# Patient Record
Sex: Female | Born: 1935 | Race: White | Hispanic: No | Smoking: Never smoker
Health system: Southern US, Community
[De-identification: ages and names within clinical notes are randomized; demographics above are authoritative.]

## PROBLEM LIST (undated history)

## (undated) DIAGNOSIS — M1990 Unspecified osteoarthritis, unspecified site: Secondary | ICD-10-CM

## (undated) DIAGNOSIS — E538 Deficiency of other specified B group vitamins: Secondary | ICD-10-CM

## (undated) DIAGNOSIS — I10 Essential (primary) hypertension: Secondary | ICD-10-CM

## (undated) DIAGNOSIS — K219 Gastro-esophageal reflux disease without esophagitis: Secondary | ICD-10-CM

## (undated) DIAGNOSIS — S62109A Fracture of unspecified carpal bone, unspecified wrist, initial encounter for closed fracture: Secondary | ICD-10-CM

## (undated) DIAGNOSIS — D649 Anemia, unspecified: Secondary | ICD-10-CM

## (undated) DIAGNOSIS — B029 Zoster without complications: Secondary | ICD-10-CM

## (undated) DIAGNOSIS — K5900 Constipation, unspecified: Secondary | ICD-10-CM

## (undated) DIAGNOSIS — E039 Hypothyroidism, unspecified: Secondary | ICD-10-CM

## (undated) DIAGNOSIS — J329 Chronic sinusitis, unspecified: Secondary | ICD-10-CM

## (undated) DIAGNOSIS — E7800 Pure hypercholesterolemia, unspecified: Secondary | ICD-10-CM

## (undated) DIAGNOSIS — F0150 Vascular dementia without behavioral disturbance: Secondary | ICD-10-CM

## (undated) DIAGNOSIS — E871 Hypo-osmolality and hyponatremia: Secondary | ICD-10-CM

## (undated) DIAGNOSIS — L501 Idiopathic urticaria: Secondary | ICD-10-CM

## (undated) DIAGNOSIS — N390 Urinary tract infection, site not specified: Secondary | ICD-10-CM

## (undated) DIAGNOSIS — F411 Generalized anxiety disorder: Secondary | ICD-10-CM

## (undated) DIAGNOSIS — Z515 Encounter for palliative care: Secondary | ICD-10-CM

## (undated) DIAGNOSIS — I872 Venous insufficiency (chronic) (peripheral): Secondary | ICD-10-CM

## (undated) DIAGNOSIS — I679 Cerebrovascular disease, unspecified: Secondary | ICD-10-CM

## (undated) DIAGNOSIS — M5030 Other cervical disc degeneration, unspecified cervical region: Secondary | ICD-10-CM

## (undated) DIAGNOSIS — K589 Irritable bowel syndrome without diarrhea: Secondary | ICD-10-CM

## (undated) HISTORY — DX: Fracture of unspecified carpal bone, unspecified wrist, initial encounter for closed fracture: S62.109A

## (undated) HISTORY — DX: Gastro-esophageal reflux disease without esophagitis: K21.9

## (undated) HISTORY — DX: Idiopathic urticaria: L50.1

## (undated) HISTORY — DX: Zoster without complications: B02.9

## (undated) HISTORY — DX: Unspecified osteoarthritis, unspecified site: M19.90

## (undated) HISTORY — DX: Deficiency of other specified B group vitamins: E53.8

## (undated) HISTORY — DX: Cerebrovascular disease, unspecified: I67.9

## (undated) HISTORY — DX: Generalized anxiety disorder: F41.1

## (undated) HISTORY — DX: Venous insufficiency (chronic) (peripheral): I87.2

## (undated) HISTORY — DX: Other cervical disc degeneration, unspecified cervical region: M50.30

## (undated) HISTORY — PX: CERVICAL FUSION: SHX112

## (undated) HISTORY — DX: Pure hypercholesterolemia, unspecified: E78.00

## (undated) HISTORY — PX: NASAL SINUS SURGERY: SHX719

## (undated) HISTORY — DX: Chronic sinusitis, unspecified: J32.9

## (undated) HISTORY — DX: Essential (primary) hypertension: I10

## (undated) HISTORY — DX: Constipation, unspecified: K59.00

## (undated) HISTORY — PX: TOTAL ABDOMINAL HYSTERECTOMY W/ BILATERAL SALPINGOOPHORECTOMY: SHX83

## (undated) HISTORY — DX: Anemia, unspecified: D64.9

## (undated) HISTORY — PX: TONSILLECTOMY: SUR1361

## (undated) HISTORY — DX: Urinary tract infection, site not specified: N39.0

## (undated) HISTORY — PX: CATARACT EXTRACTION: SUR2

---

## 1998-03-30 ENCOUNTER — Observation Stay (HOSPITAL_COMMUNITY): Admission: EM | Admit: 1998-03-30 | Discharge: 1998-04-01 | Payer: Self-pay | Admitting: Emergency Medicine

## 1998-03-30 ENCOUNTER — Encounter: Payer: Self-pay | Admitting: Emergency Medicine

## 1998-05-27 ENCOUNTER — Ambulatory Visit (HOSPITAL_COMMUNITY): Admission: RE | Admit: 1998-05-27 | Discharge: 1998-05-27 | Payer: Self-pay | Admitting: Neurosurgery

## 1998-06-20 ENCOUNTER — Ambulatory Visit: Admission: RE | Admit: 1998-06-20 | Discharge: 1998-06-21 | Payer: Self-pay | Admitting: Neurosurgery

## 1998-06-23 ENCOUNTER — Encounter: Payer: Self-pay | Admitting: Gastroenterology

## 1998-06-23 ENCOUNTER — Ambulatory Visit (HOSPITAL_COMMUNITY): Admission: RE | Admit: 1998-06-23 | Discharge: 1998-06-23 | Payer: Self-pay | Admitting: Gastroenterology

## 1998-06-30 ENCOUNTER — Inpatient Hospital Stay (HOSPITAL_COMMUNITY): Admission: RE | Admit: 1998-06-30 | Discharge: 1998-07-02 | Payer: Self-pay | Admitting: Neurosurgery

## 1999-01-16 ENCOUNTER — Ambulatory Visit (HOSPITAL_COMMUNITY): Admission: RE | Admit: 1999-01-16 | Discharge: 1999-01-16 | Payer: Self-pay | Admitting: Gastroenterology

## 1999-09-04 ENCOUNTER — Encounter: Payer: Self-pay | Admitting: Pulmonary Disease

## 1999-09-04 ENCOUNTER — Inpatient Hospital Stay (HOSPITAL_COMMUNITY): Admission: EM | Admit: 1999-09-04 | Discharge: 1999-09-09 | Payer: Self-pay | Admitting: Pulmonary Disease

## 1999-09-05 ENCOUNTER — Encounter: Payer: Self-pay | Admitting: Pulmonary Disease

## 2000-03-27 ENCOUNTER — Encounter: Payer: Self-pay | Admitting: Gastroenterology

## 2000-03-27 ENCOUNTER — Ambulatory Visit (HOSPITAL_COMMUNITY): Admission: RE | Admit: 2000-03-27 | Discharge: 2000-03-27 | Payer: Self-pay | Admitting: Gastroenterology

## 2000-04-04 ENCOUNTER — Encounter: Admission: RE | Admit: 2000-04-04 | Discharge: 2000-04-04 | Payer: Self-pay | Admitting: Orthopedic Surgery

## 2000-04-04 ENCOUNTER — Encounter: Payer: Self-pay | Admitting: Orthopedic Surgery

## 2001-03-06 ENCOUNTER — Emergency Department (HOSPITAL_COMMUNITY): Admission: EM | Admit: 2001-03-06 | Discharge: 2001-03-06 | Payer: Self-pay

## 2001-10-01 ENCOUNTER — Emergency Department (HOSPITAL_COMMUNITY): Admission: EM | Admit: 2001-10-01 | Discharge: 2001-10-01 | Payer: Self-pay

## 2001-10-02 ENCOUNTER — Encounter: Payer: Self-pay | Admitting: Pulmonary Disease

## 2001-10-02 ENCOUNTER — Inpatient Hospital Stay (HOSPITAL_COMMUNITY): Admission: EM | Admit: 2001-10-02 | Discharge: 2001-10-04 | Payer: Self-pay | Admitting: Emergency Medicine

## 2001-10-03 ENCOUNTER — Encounter: Payer: Self-pay | Admitting: Pulmonary Disease

## 2004-06-26 ENCOUNTER — Ambulatory Visit: Payer: Self-pay | Admitting: Pulmonary Disease

## 2004-09-18 ENCOUNTER — Ambulatory Visit (HOSPITAL_COMMUNITY): Admission: RE | Admit: 2004-09-18 | Discharge: 2004-09-18 | Payer: Self-pay | Admitting: Gastroenterology

## 2004-09-18 ENCOUNTER — Encounter (INDEPENDENT_AMBULATORY_CARE_PROVIDER_SITE_OTHER): Payer: Self-pay | Admitting: *Deleted

## 2005-01-06 ENCOUNTER — Emergency Department (HOSPITAL_COMMUNITY): Admission: EM | Admit: 2005-01-06 | Discharge: 2005-01-06 | Payer: Self-pay | Admitting: Emergency Medicine

## 2005-02-02 ENCOUNTER — Emergency Department (HOSPITAL_COMMUNITY): Admission: EM | Admit: 2005-02-02 | Discharge: 2005-02-02 | Payer: Self-pay | Admitting: Emergency Medicine

## 2005-06-20 ENCOUNTER — Ambulatory Visit: Payer: Self-pay | Admitting: Pulmonary Disease

## 2005-08-01 ENCOUNTER — Ambulatory Visit: Payer: Self-pay | Admitting: Pulmonary Disease

## 2005-11-29 ENCOUNTER — Ambulatory Visit: Payer: Self-pay | Admitting: Pulmonary Disease

## 2006-02-28 ENCOUNTER — Ambulatory Visit: Payer: Self-pay | Admitting: Pulmonary Disease

## 2006-06-27 ENCOUNTER — Ambulatory Visit: Payer: Self-pay | Admitting: Pulmonary Disease

## 2007-01-14 ENCOUNTER — Ambulatory Visit: Payer: Self-pay | Admitting: Pulmonary Disease

## 2007-01-20 ENCOUNTER — Ambulatory Visit: Payer: Self-pay | Admitting: Pulmonary Disease

## 2007-01-20 LAB — CONVERTED CEMR LAB
ALT: 18 units/L (ref 0–40)
Alkaline Phosphatase: 60 units/L (ref 39–117)
BUN: 10 mg/dL (ref 6–23)
Basophils Absolute: 0 10*3/uL (ref 0.0–0.1)
Basophils Relative: 0.2 % (ref 0.0–1.0)
Bilirubin, Direct: 0.1 mg/dL (ref 0.0–0.3)
CO2: 31 meq/L (ref 19–32)
Calcium: 8.9 mg/dL (ref 8.4–10.5)
Chloride: 101 meq/L (ref 96–112)
Eosinophils Absolute: 0.1 10*3/uL (ref 0.0–0.6)
GFR calc non Af Amer: 85 mL/min
HDL: 50.2 mg/dL (ref >39.0)
Monocytes Absolute: 0.4 10*3/uL (ref 0.2–0.7)
Monocytes Relative: 10.9 % (ref 3.0–11.0)
RDW: 14 % (ref 11.5–14.6)
TSH: 4.86 microintl units/mL (ref 0.35–5.50)
Total Bilirubin: 0.6 mg/dL (ref 0.3–1.2)
Total Protein: 6.7 g/dL (ref 6.0–8.3)

## 2007-05-14 ENCOUNTER — Encounter: Admission: RE | Admit: 2007-05-14 | Discharge: 2007-05-14 | Payer: Self-pay | Admitting: Pulmonary Disease

## 2007-07-11 DIAGNOSIS — I10 Essential (primary) hypertension: Secondary | ICD-10-CM | POA: Insufficient documentation

## 2007-07-11 DIAGNOSIS — F411 Generalized anxiety disorder: Secondary | ICD-10-CM | POA: Insufficient documentation

## 2007-07-11 DIAGNOSIS — K219 Gastro-esophageal reflux disease without esophagitis: Secondary | ICD-10-CM

## 2007-07-11 DIAGNOSIS — M1990 Unspecified osteoarthritis, unspecified site: Secondary | ICD-10-CM | POA: Insufficient documentation

## 2007-07-11 DIAGNOSIS — K5900 Constipation, unspecified: Secondary | ICD-10-CM | POA: Insufficient documentation

## 2007-07-14 ENCOUNTER — Ambulatory Visit: Payer: Self-pay | Admitting: Pulmonary Disease

## 2007-07-14 DIAGNOSIS — M5030 Other cervical disc degeneration, unspecified cervical region: Secondary | ICD-10-CM

## 2007-07-14 DIAGNOSIS — I872 Venous insufficiency (chronic) (peripheral): Secondary | ICD-10-CM | POA: Insufficient documentation

## 2007-07-14 DIAGNOSIS — J329 Chronic sinusitis, unspecified: Secondary | ICD-10-CM | POA: Insufficient documentation

## 2007-07-14 DIAGNOSIS — E7800 Pure hypercholesterolemia, unspecified: Secondary | ICD-10-CM

## 2007-07-14 DIAGNOSIS — L501 Idiopathic urticaria: Secondary | ICD-10-CM

## 2007-07-14 DIAGNOSIS — I679 Cerebrovascular disease, unspecified: Secondary | ICD-10-CM

## 2007-07-14 LAB — CONVERTED CEMR LAB
ALT: 23 units/L (ref 0–35)
Alkaline Phosphatase: 62 units/L (ref 39–117)
BUN: 8 mg/dL (ref 6–23)
Basophils Absolute: 0 10*3/uL (ref 0.0–0.1)
CO2: 32 meq/L (ref 19–32)
Calcium: 9.9 mg/dL (ref 8.4–10.5)
Cholesterol: 204 mg/dL (ref 0–200)
Creatinine, Ser: 0.8 mg/dL (ref 0.4–1.2)
Direct LDL: 105.7 mg/dL
GFR calc Af Amer: 89 mL/min
Glucose, Bld: 104 mg/dL — ABNORMAL HIGH (ref 70–99)
HCT: 36.4 % (ref 36.0–46.0)
HDL: 66.9 mg/dL (ref >39.0)
Iron: 92 ug/dL (ref 42–145)
Monocytes Absolute: 0.5 10*3/uL (ref 0.2–0.7)
Neutro Abs: 1.9 10*3/uL (ref 1.4–7.7)
RDW: 12.7 % (ref 11.5–14.6)
Sodium: 133 meq/L — ABNORMAL LOW (ref 135–145)
TSH: 4.17 microintl units/mL (ref 0.35–5.50)
Total Bilirubin: 0.7 mg/dL (ref 0.3–1.2)
Transferrin: 314.4 mg/dL (ref >=212.0)
VLDL: 19 mg/dL (ref 0–40)

## 2007-07-15 ENCOUNTER — Ambulatory Visit (HOSPITAL_COMMUNITY): Admission: RE | Admit: 2007-07-15 | Discharge: 2007-07-15 | Payer: Self-pay | Admitting: Gastroenterology

## 2007-07-15 ENCOUNTER — Encounter (INDEPENDENT_AMBULATORY_CARE_PROVIDER_SITE_OTHER): Payer: Self-pay | Admitting: Gastroenterology

## 2007-08-19 ENCOUNTER — Encounter: Payer: Self-pay | Admitting: Pulmonary Disease

## 2007-11-20 ENCOUNTER — Emergency Department (HOSPITAL_COMMUNITY): Admission: EM | Admit: 2007-11-20 | Discharge: 2007-11-20 | Payer: Self-pay | Admitting: Emergency Medicine

## 2007-12-24 ENCOUNTER — Emergency Department (HOSPITAL_COMMUNITY): Admission: EM | Admit: 2007-12-24 | Discharge: 2007-12-24 | Payer: Self-pay | Admitting: Emergency Medicine

## 2008-01-01 ENCOUNTER — Ambulatory Visit: Payer: Self-pay | Admitting: Pulmonary Disease

## 2008-01-01 DIAGNOSIS — S62109A Fracture of unspecified carpal bone, unspecified wrist, initial encounter for closed fracture: Secondary | ICD-10-CM

## 2008-01-14 ENCOUNTER — Ambulatory Visit (HOSPITAL_BASED_OUTPATIENT_CLINIC_OR_DEPARTMENT_OTHER): Admission: RE | Admit: 2008-01-14 | Discharge: 2008-01-14 | Payer: Self-pay | Admitting: Orthopedic Surgery

## 2008-04-30 ENCOUNTER — Ambulatory Visit: Payer: Self-pay | Admitting: Pulmonary Disease

## 2008-05-03 ENCOUNTER — Ambulatory Visit: Payer: Self-pay | Admitting: Pulmonary Disease

## 2008-05-07 LAB — CONVERTED CEMR LAB
ALT: 15 units/L (ref 0–35)
AST: 31 units/L (ref 0–37)
BUN: 8 mg/dL (ref 6–23)
Basophils Relative: 0.2 % (ref 0.0–3.0)
Bilirubin, Direct: 0.2 mg/dL (ref 0.0–0.3)
Chloride: 106 meq/L (ref 96–112)
Cholesterol: 183 mg/dL (ref 0–200)
Eosinophils Absolute: 0.1 10*3/uL (ref 0.0–0.7)
Eosinophils Relative: 1.9 % (ref 0.0–5.0)
Monocytes Absolute: 0.4 10*3/uL (ref 0.1–1.0)
Neutro Abs: 0.8 10*3/uL — ABNORMAL LOW (ref 1.4–7.7)
Platelets: 230 10*3/uL (ref 150–400)
RDW: 14.3 % (ref 11.5–14.6)
Total CHOL/HDL Ratio: 2.7
Total Protein: 7.7 g/dL (ref 6.0–8.3)
Vit D, 1,25-Dihydroxy: 26 — ABNORMAL LOW (ref 30–89)
WBC: 3.1 10*3/uL — ABNORMAL LOW (ref 4.5–10.5)

## 2008-05-19 ENCOUNTER — Encounter: Admission: RE | Admit: 2008-05-19 | Discharge: 2008-05-19 | Payer: Self-pay | Admitting: Pulmonary Disease

## 2008-08-31 ENCOUNTER — Ambulatory Visit: Payer: Self-pay | Admitting: Pulmonary Disease

## 2008-09-01 DIAGNOSIS — D649 Anemia, unspecified: Secondary | ICD-10-CM

## 2008-09-01 LAB — CONVERTED CEMR LAB
ALT: 14 units/L (ref 0–35)
AST: 25 units/L (ref 0–37)
Albumin: 4.1 g/dL (ref 3.5–5.2)
Alkaline Phosphatase: 58 units/L (ref 39–117)
Basophils Relative: 0.1 % (ref 0.0–3.0)
Bilirubin, Direct: 0.1 mg/dL (ref 0.0–0.3)
Eosinophils Absolute: 0.1 10*3/uL (ref 0.0–0.7)
Eosinophils Relative: 1.6 % (ref 0.0–5.0)
Folate: 10.7 ng/mL
Hemoglobin: 12.1 g/dL (ref 12.0–15.0)
Iron: 83 ug/dL (ref 42–145)
Lymphocytes Relative: 49.5 % — ABNORMAL HIGH (ref 12.0–46.0)
MCHC: 34.8 g/dL (ref 30.0–36.0)
MCV: 88.9 fL (ref 78.0–100.0)
Monocytes Absolute: 0.3 10*3/uL (ref 0.1–1.0)
Monocytes Relative: 10.2 % (ref 3.0–12.0)
Neutro Abs: 1.3 10*3/uL — ABNORMAL LOW (ref 1.4–7.7)
Sodium: 135 meq/L (ref 135–145)
Transferrin: 272.7 mg/dL (ref >=212.0)
WBC: 3.3 10*3/uL — ABNORMAL LOW (ref 4.5–10.5)

## 2008-09-07 ENCOUNTER — Ambulatory Visit: Payer: Self-pay | Admitting: Pulmonary Disease

## 2008-10-07 ENCOUNTER — Ambulatory Visit: Payer: Self-pay | Admitting: Pulmonary Disease

## 2008-10-15 ENCOUNTER — Inpatient Hospital Stay (HOSPITAL_COMMUNITY): Admission: AD | Admit: 2008-10-15 | Discharge: 2008-10-16 | Payer: Self-pay | Admitting: Gastroenterology

## 2008-11-01 ENCOUNTER — Encounter: Payer: Self-pay | Admitting: Pulmonary Disease

## 2008-11-04 ENCOUNTER — Ambulatory Visit: Payer: Self-pay | Admitting: Pulmonary Disease

## 2008-12-07 ENCOUNTER — Ambulatory Visit: Payer: Self-pay | Admitting: Pulmonary Disease

## 2008-12-14 ENCOUNTER — Encounter: Payer: Self-pay | Admitting: Pulmonary Disease

## 2009-01-04 ENCOUNTER — Ambulatory Visit: Payer: Self-pay | Admitting: Pulmonary Disease

## 2009-01-13 ENCOUNTER — Ambulatory Visit: Payer: Self-pay | Admitting: Internal Medicine

## 2009-01-14 ENCOUNTER — Encounter: Payer: Self-pay | Admitting: Adult Health

## 2009-01-16 ENCOUNTER — Telehealth: Payer: Self-pay | Admitting: Internal Medicine

## 2009-01-17 LAB — CONVERTED CEMR LAB
Bilirubin Urine: NEGATIVE
Hemoglobin, Urine: NEGATIVE
Specific Gravity, Urine: 1.01 (ref 1.000–1.030)
Total Protein, Urine: 30 mg/dL
Urine Glucose: NEGATIVE mg/dL
pH: 7.5 (ref 5.0–8.0)

## 2009-01-24 ENCOUNTER — Ambulatory Visit: Payer: Self-pay | Admitting: Pulmonary Disease

## 2009-01-24 ENCOUNTER — Telehealth (INDEPENDENT_AMBULATORY_CARE_PROVIDER_SITE_OTHER): Payer: Self-pay | Admitting: *Deleted

## 2009-01-24 DIAGNOSIS — E538 Deficiency of other specified B group vitamins: Secondary | ICD-10-CM

## 2009-01-24 DIAGNOSIS — B029 Zoster without complications: Secondary | ICD-10-CM | POA: Insufficient documentation

## 2009-01-24 DIAGNOSIS — E079 Disorder of thyroid, unspecified: Secondary | ICD-10-CM | POA: Insufficient documentation

## 2009-02-08 ENCOUNTER — Ambulatory Visit: Payer: Self-pay | Admitting: Pulmonary Disease

## 2009-03-11 ENCOUNTER — Telehealth: Payer: Self-pay | Admitting: Pulmonary Disease

## 2009-03-14 ENCOUNTER — Ambulatory Visit: Payer: Self-pay | Admitting: Pulmonary Disease

## 2009-03-16 ENCOUNTER — Ambulatory Visit: Payer: Self-pay | Admitting: Pulmonary Disease

## 2009-03-16 LAB — CONVERTED CEMR LAB
Albumin: 4.6 g/dL (ref 3.5–5.2)
Basophils Relative: 0.4 % (ref 0.0–3.0)
CO2: 30 meq/L (ref 19–32)
Direct LDL: 136.5 mg/dL
Eosinophils Absolute: 0 10*3/uL (ref 0.0–0.7)
Eosinophils Relative: 0.6 % (ref 0.0–5.0)
GFR calc non Af Amer: 84.83 mL/min (ref >60)
Glucose, Bld: 110 mg/dL — ABNORMAL HIGH (ref 70–99)
Lymphocytes Relative: 49.1 % — ABNORMAL HIGH (ref 12.0–46.0)
Lymphs Abs: 2.3 10*3/uL (ref 0.7–4.0)
Monocytes Absolute: 0.5 10*3/uL (ref 0.1–1.0)
RBC: 3.8 M/uL — ABNORMAL LOW (ref 3.87–5.11)
TSH: 5.08 microintl units/mL (ref 0.35–5.50)
Total CHOL/HDL Ratio: 3
Total Protein: 8.1 g/dL (ref 6.0–8.3)
Triglycerides: 78 mg/dL (ref 0.0–149.0)
Vitamin B-12: 470 pg/mL (ref 211–911)

## 2009-04-19 ENCOUNTER — Ambulatory Visit: Payer: Self-pay | Admitting: Pulmonary Disease

## 2009-05-18 ENCOUNTER — Ambulatory Visit: Payer: Self-pay | Admitting: Pulmonary Disease

## 2009-06-22 ENCOUNTER — Ambulatory Visit: Payer: Self-pay | Admitting: Pulmonary Disease

## 2009-07-08 ENCOUNTER — Ambulatory Visit: Payer: Self-pay | Admitting: Pulmonary Disease

## 2009-08-31 ENCOUNTER — Ambulatory Visit: Payer: Self-pay | Admitting: Pulmonary Disease

## 2009-10-05 ENCOUNTER — Ambulatory Visit: Payer: Self-pay | Admitting: Pulmonary Disease

## 2009-10-24 ENCOUNTER — Telehealth (INDEPENDENT_AMBULATORY_CARE_PROVIDER_SITE_OTHER): Payer: Self-pay | Admitting: *Deleted

## 2010-01-04 ENCOUNTER — Ambulatory Visit: Payer: Self-pay | Admitting: Pulmonary Disease

## 2010-01-04 DIAGNOSIS — J449 Chronic obstructive pulmonary disease, unspecified: Secondary | ICD-10-CM

## 2010-01-04 DIAGNOSIS — J4489 Other specified chronic obstructive pulmonary disease: Secondary | ICD-10-CM | POA: Insufficient documentation

## 2010-01-09 LAB — CONVERTED CEMR LAB
AST: 26 units/L (ref 0–37)
Alkaline Phosphatase: 69 units/L (ref 39–117)
CO2: 32 meq/L (ref 19–32)
Calcium: 10 mg/dL (ref 8.4–10.5)
HCT: 31.8 % — ABNORMAL LOW (ref 36.0–46.0)
LDL Cholesterol: 104 mg/dL — ABNORMAL HIGH (ref 0–99)
MCV: 88 fL (ref 78.0–100.0)
Monocytes Absolute: 0.4 10*3/uL (ref 0.1–1.0)
Neutro Abs: 1.7 10*3/uL (ref 1.4–7.7)
Neutrophils Relative %: 39.1 % — ABNORMAL LOW (ref 43.0–77.0)
Platelets: 296 10*3/uL (ref 150.0–400.0)
Potassium: 4.2 meq/L (ref 3.5–5.1)
RBC: 3.61 M/uL — ABNORMAL LOW (ref 3.87–5.11)
Sodium: 132 meq/L — ABNORMAL LOW (ref 135–145)
TSH: 6.11 microintl units/mL — ABNORMAL HIGH (ref 0.35–5.50)
Triglycerides: 80 mg/dL (ref 0.0–149.0)
VLDL: 16 mg/dL (ref 0.0–40.0)
WBC: 4.5 10*3/uL (ref 4.5–10.5)

## 2010-01-31 ENCOUNTER — Ambulatory Visit: Payer: Self-pay | Admitting: Pulmonary Disease

## 2010-02-15 ENCOUNTER — Telehealth (INDEPENDENT_AMBULATORY_CARE_PROVIDER_SITE_OTHER): Payer: Self-pay | Admitting: *Deleted

## 2010-03-17 ENCOUNTER — Ambulatory Visit: Payer: Self-pay | Admitting: Pulmonary Disease

## 2010-03-27 ENCOUNTER — Observation Stay (HOSPITAL_COMMUNITY): Admission: EM | Admit: 2010-03-27 | Discharge: 2010-03-28 | Payer: Self-pay | Admitting: Podiatry

## 2010-04-03 ENCOUNTER — Telehealth (INDEPENDENT_AMBULATORY_CARE_PROVIDER_SITE_OTHER): Payer: Self-pay | Admitting: *Deleted

## 2010-04-11 ENCOUNTER — Ambulatory Visit: Payer: Self-pay | Admitting: Pulmonary Disease

## 2010-04-13 LAB — CONVERTED CEMR LAB
Bilirubin Urine: NEGATIVE
CO2: 29 meq/L (ref 19–32)
Calcium: 9.5 mg/dL (ref 8.4–10.5)
Glucose, Bld: 97 mg/dL (ref 70–99)
Ketones, ur: NEGATIVE mg/dL
Nitrite: NEGATIVE
Sodium: 128 meq/L — ABNORMAL LOW (ref 135–145)
Total Protein, Urine: NEGATIVE mg/dL
Urine Glucose: NEGATIVE mg/dL
Urobilinogen, UA: 0.2 (ref 0.0–1.0)
pH: 6 (ref 5.0–8.0)

## 2010-05-10 ENCOUNTER — Encounter: Payer: Self-pay | Admitting: Pulmonary Disease

## 2010-06-06 ENCOUNTER — Emergency Department (HOSPITAL_COMMUNITY): Admission: EM | Admit: 2010-06-06 | Discharge: 2010-06-06 | Payer: Self-pay | Admitting: Emergency Medicine

## 2010-06-07 ENCOUNTER — Encounter: Payer: Self-pay | Admitting: Pulmonary Disease

## 2010-06-07 ENCOUNTER — Encounter: Admission: RE | Admit: 2010-06-07 | Discharge: 2010-06-07 | Payer: Self-pay | Admitting: Gastroenterology

## 2010-06-07 ENCOUNTER — Telehealth: Payer: Self-pay | Admitting: Pulmonary Disease

## 2010-06-08 ENCOUNTER — Observation Stay (HOSPITAL_COMMUNITY): Admission: EM | Admit: 2010-06-08 | Discharge: 2010-06-10 | Payer: Self-pay | Admitting: Emergency Medicine

## 2010-06-30 ENCOUNTER — Encounter: Payer: Self-pay | Admitting: Pulmonary Disease

## 2010-07-04 ENCOUNTER — Ambulatory Visit (HOSPITAL_COMMUNITY): Admission: RE | Admit: 2010-07-04 | Discharge: 2010-07-04 | Payer: Self-pay | Admitting: Gastroenterology

## 2010-07-11 ENCOUNTER — Telehealth: Payer: Self-pay | Admitting: Pulmonary Disease

## 2010-07-17 ENCOUNTER — Ambulatory Visit: Payer: Self-pay | Admitting: Pulmonary Disease

## 2010-09-14 NOTE — Assessment & Plan Note (Signed)
Summary: B12/jd   Nurse Visit   Allergies: 1)  ! * Asprin  Medication Administration  Injection # 1:    Medication: Vit B12 1000 mcg    Diagnosis: ANEMIA (ICD-285.9)    Route: SQ    Site: R deltoid    Exp Date: 04/14/2011    Lot #: [id number]    Mfr: American Regent    Comments: B - 12    Patient tolerated injection without complications    Given by: Babette Relic Ivonne [place] IN ALLERGY LAB  Orders Added: 1)  Vit B12 1000 mcg [J3420] 2)  Admin of Therapeutic Inj  intramuscular or subcutaneous [96372]   Medication Administration  Injection # 1:    Medication: Vit B12 1000 mcg    Diagnosis: ANEMIA (ICD-285.9)    Route: SQ    Site: R deltoid    Exp Date: 04/14/2011    Lot #: [id number]    Mfr: American Regent    Comments: B - 12    Patient tolerated injection without complications    Given by: Babette Relic Harlo Jaso IN ALLERGY LAB  Orders Added: 1)  Vit B12 1000 mcg [J3420] 2)  Admin of Therapeutic Inj  intramuscular or subcutaneous [54098]

## 2010-09-14 NOTE — Progress Notes (Signed)
Summary: appt- post hosp f/u appt  Phone Note Call from Patient Call back at Home Phone [phone]   Caller: Patient Call For: nadel Reason for Call: Talk to Nurse Summary of Call: pt needs a 2 week post hosp appt.  She can't come on Thursdays. Initial call taken by: Eugene Gavia,  April 03, 2010 9:16 AM  Follow-up for Phone Call        Marliss Czar, Please advise where we can add pt on to SN's schedule.  Thank you.  Aundra Millet Reynolds LPN  April 03, 2010 9:30 AM    we can add her on 8-30 at 3pm.  have her bring all of her meds please.  thanks Randell Loop CMA  April 03, 2010 9:34 AM   Additional Follow-up for Phone Call Additional follow up Details #1::        called and spoke with pt.  pt is aware of appt date and time and will bring all her meds with her to this appt.  Aundra Millet Reynolds LPN  April 03, 2010 9:45 AM

## 2010-09-14 NOTE — Assessment & Plan Note (Signed)
Summary: b-12/apc   Nurse Visit   Allergies: 1)  ! * Asprin  Medication Administration  Injection # 1:    Medication: Vit B12 1000 mcg    Diagnosis: ANEMIA (ICD-285.9)    Route: SQ    Site: R deltoid    Exp Date: 04/2011    Lot #: [id number]    Mfr: American Regent    Comments: Injection given by Dimas Millin in allergy lab.     Patient tolerated injection without complications  Orders Added: 1)  Admin of Therapeutic Inj  intramuscular or subcutaneous [96372] 2)  Vit B12 1000 mcg [J3420]

## 2010-09-14 NOTE — Assessment & Plan Note (Signed)
Summary: post hosp f/u appt.  ok per Leigh/mg   CC:  3 month ROV & post hosp check....  History of Present Illness: 77 y/o WF here for a follow up of her mult med problems including Chr Sinusitis, HBP, ASPVD, Chol, etc...   ~  Jan 04, 2010:  she has lost 5# down to 117# but has good appetite & eating well, she says... we discussed nutritional supplements like carnation instant & she will try this once or twice daily...  she has chr sinus symptoms;  BP controlled on meds;  clinically euthyroid despite sl elev TSH;  she remains on monthly B12 shots...   ~  April 11, 2010:  she was hosp by [facility] 03/27/10 after "presyncopal episode" at [facility]- weak, dizzy, no LOC etc... also found to have UTI (sens EColi- treated in hosp), & BP meds adusted- reported to be sl postural, but she misunderstood & back on same meds as before> we discussed stopping her Diovan/Hct & change to Losartan 50mg /d... hosp records reviewed: no arrhythmia on monitor; CXR w/ COPD, calcif ThorAo, prev CSpine fusion, NAD;  sl anemic w/ Hg=10-11; mild electrolyte abn w/ Na=128, K=3.2.Marland KitchenMarland Kitchen  NOTE: pt had similar dizzy spell 4/09 while at [facility] and was eval in the ER:  BP was 150/74;  BS was 115 & labs WNL;  CT Head w/ atrophy, sm vessel dis, chr sinus dis, NAD;  EKG w/ NSR, NAD & monitor without arrhthmias...     Current Problem List:  SINUSITIS, CHRONIC (ICD-473.9) - Long Hx chronic sinus problems - Eval & Rx by DrShoemaker w/ hx epistaxis, nasal septal erosion w/ prev "button";  and nasal polyps... she uses Saline, Mucinex, etc... she notes mild chr cough from the drainage- we reviewed chronic nasal regimen & rec MUCINEX DM 2Bid...  HYPERTENSION (ICD-401.9) - controlled on AMLODIPINE 5mg /d & DIOVAN/HCT 160-12.5 daily & K20 daily... BP today = 134/58, no postural changes... she denies HA, visual symptoms, CP, palpit, change in SOB, edema, etc...   ~  Baseline EKG w/ NSR, WNL;    ~  2D ECHO 2001 was normal.  ~  CXR 11/07 w/ Biapical pl thickening,  NAD... CXR 9/09 showed COPD w/ incr markings, NAD... f/u CXR 5/11 showed COPD, NAD...   ~  11/10: c/o weak in AM- decision to decr the Amlodipine to 5mg /d & BP has been stable on this.  ~  8/11: brief hosp by [facility] w/ "presyncope" & meds adjusted but she restarted prev Rx on discharge... we decided to stop the Diovan/Hct in favor of LOSARTAN 50mg /d.  CEREBROVASCULAR DISEASE (ICD-437.9) - on ASA 81mg /d... Prev eval for syncope (related to postural BP changes) w/ MRI showing small vessel dz;  she has bilat CBruits w/ Carotid Dopplers in 2001 showing mild plaque, no signif ICA stenoses...  VENOUS INSUFFICIENCY (ICD-459.81) - on low sodium diet + support hose as needed.  HYPERCHOLESTEROLEMIA (ICD-272.0) - on SIMVASTATIN 40mg /d but she thinks this makes her weak & she wants to stop.  ~  FLP 12/08 showed TChol 204, TG 93, HDL 67, LDL 106  ~  FLP 9/09 showed TChol 183, TG 53, HDL 68, LDL 105  ~  FLP 8/10 showed TChol 224, TG 78, HDL 67, LDL 137... rec> take med regularly & work on diet.  ~  FLP 5/11 showed TChol 187, TG 80, HDL 67, LDL 104  ~  8/11: she thinks the Simva40 makes her weak & she wants to put it on HOLD.  UNSPECIFIED DISORDER OF  THYROID (ICD-246.9) - she has a sl elevated TSH which we are following...  ~  labs 9/09 showed TSH= 7.31  ~  labs 1/10 showed TSH= 5.40  ~  labs 8/10 showed TSH= 5.08... she does c/o being "cold"  ~  labs 5/11 showed TSH= 6.11... she notes that her energy is "pretty fair"  ~  labs 8/11 showed TSH=        wt= 116# & stable over the last 48mo.  GERD (ICD-530.81) - PPI changed to generic PREVACID 30mg /d... Known HH & GERD w/ EGD from [facility] 8/01 & 2/06 showing HH, GERD, erosions, & gastric polyp... last EGD 12/08 w/ savory dilatation... she is encouraged to f/u w/ DrMagod if symptoms occur...  CONSTIPATION (ICD-564.00) - Hx severe constipation w/ fecal impaction req hosp 2/03;  last colon = 2/06 DrMagod w/ hemorrhoids & sm polyps... she uses MIRALAX daily...  UTI  (ICD-599.0) - Hx chronic UTI's Rx by Urology Joneen Boers w/ Macrodantin in the past...  ~  8/11: hosp by [facility] w/ UTI- sens EColi treated w/ Rocephin/ then Septra.  DEGENERATIVE JOINT DISEASE (ICD-715.90) - she uses Tylenol/ Advil as needed...  ~  5/09:  she fell and broke her right wrist- s/p ORIF 6/09 by DrWeingold  DEGENERATIVE DISC DISEASE, CERVICAL SPINE (ICD-722.4) - CSpine surg by DrJenkins 1998...  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  ANEMIA (ICD-285.9), & VITAMIN B12 DEFICIENCY (ICD-266.2) - Hx mild anemia   ~  labs 6/08 showed Hg= 10.5  ~  labs 12/08 showed Hg= 12.3, Fe= 92  ~  labs 9/09 showed Hg= 11.8, MCV=89  ~  labs 1/10 showed Hg= 12.1, MCV= 89, Fe= 83, B12= 186... started on B12 shots monthly.  ~  labs 8/10 showed Hg= 12.2, MCV= 92, Fe= 87, B12= 470... continue Rx.  ~  labs 5/11 showed Hg= 11.2, MCV= 88  ~  labs 8/11 in hosp showed Hg= 10-11 range.  Hx SHINGLES - right T9-10 distrib 6/10 & treated w/ Famvir, Medrol, [id number]...   Preventive Screening-Counseling & Management  Alcohol-Tobacco     Smoking Status: never  Allergies: 1)  ! * Asprin  Comments:  Nurse/Medical Assistant: The patient's medications and allergies were reviewed with the patient and were updated in the Medication and Allergy Lists.  Past History:  Past Medical History: SINUSITIS, CHRONIC (ICD-473.9) HYPERTENSION (ICD-401.9) CEREBROVASCULAR DISEASE (ICD-437.9) VENOUS INSUFFICIENCY (ICD-459.81) HYPERCHOLESTEROLEMIA (ICD-272.0) UNSPECIFIED DISORDER OF THYROID (ICD-246.9) GERD (ICD-530.81) CONSTIPATION (ICD-564.00) Hx of UTI (ICD-599.0) DEGENERATIVE JOINT DISEASE (ICD-715.90) DEGENERATIVE DISC DISEASE, CERVICAL SPINE (ICD-722.4) CLOSED FRACTURE OF OTHER BONE OF WRIST (ICD-814.09) ANXIETY (ICD-300.00) Hx of IDIOPATHIC URTICARIA (ICD-708.1) ANEMIA (ICD-285.9) VITAMIN B12 DEFICIENCY  (ICD-266.2) SHINGLES (ICD-053.9)  Past Surgical History: S/P Ant Cervical discectomy, fusion, & plating 11/99 by DrJenkins Hysterectomy & Bilat Salpingo-oopherectomy Tonsillectomy Nasal Surg Cataract extraction - S/P Bilat Cataract Surg by Hazle Nordmann 2005  Family History: Reviewed history from 04/30/2008 and no changes required. Father died at age 72 from diabetes complications Mother died at age 55 from heart failure 3 siblings: 1 sister died at age 42 from diabetes complications 1 sister died at age 28 from diabetes complications 1 sister died at age 70 from diabetes complications  Social History: Reviewed history from 04/30/2008 and no changes required. retired pt is widowed  3 children non-smoker no alcohol  Review of Systems      See HPI       The patient  complains of decreased hearing, dyspnea on exertion, muscle weakness, and difficulty walking.  The patient denies anorexia, fever, weight loss, weight gain, vision loss, hoarseness, chest pain, syncope, peripheral edema, prolonged cough, headaches, hemoptysis, abdominal pain, melena, hematochezia, severe indigestion/heartburn, hematuria, incontinence, suspicious skin lesions, transient blindness, depression, unusual weight change, abnormal bleeding, enlarged lymph nodes, and angioedema.    Vital Signs:  Patient profile:   77 year old female Height:      66 inches Weight:      116.25 pounds BMI:     18.83 O2 Sat:      95 % on Room air Temp:     97.4 degrees F oral Pulse rate:   78 / minute BP sitting:   134 / 58  (right arm) Cuff size:   regular  Vitals Entered By: Randell Loop CMA (April 11, 2010 2:49 PM)  O2 Sat at Rest %:  95 O2 Flow:  Room air CC: 3 month ROV & post hosp check... Is Patient Diabetic? No Pain Assessment Patient in pain? no      Comments meds updated today with pt--pt brought all of her meds today   Physical Exam  Additional Exam:  WD, WN, 77 y/o WF in NAD... she is chr ill  appearing... GENERAL:  Alert & oriented; pleasant & cooperative... HEENT:  [place]/AT, EOM-wnl, PERRLA, EACs- sl wax, TMs-wnl, NOSE- nasal septal perf, THROAT- sl red +drainage... NECK:  Supple w/ decrROM; no JVD; normal carotid impulses w/ 1+bruits; no thyromegaly or nodules palpated; no lymphadenopathy. CHEST:  Clear to P & A; without wheezes or rales, but scat rhonchi heard... HEART:  Regular Rhythm;  gr1/6 SEM without rubs or gallops detected... ABDOMEN:  Soft & nontender; normal bowel sounds; no organomegaly or masses palpated... EXT: without deformities, mild arthritic changes; no varicose veins/ +venous insuffic/ no edema. NEURO:  CN's intact;  no focal neuro deficits, she is weak diffusely... DERM: scarring in the right T9-10 distrib of her prev shingles eruption...    MISC. Report  Procedure date:  04/11/2010  Findings:      DATA REVIEWED:  ~  [facility] records 8/15-16/11 w/ DCSummary, XRays, & Labs...   MISC. Report  Procedure date:  04/11/2010  Findings:      BMP (METABOL)   Sodium               [L]  128 mEq/L                   135-145   Potassium                 3.9 mEq/L                   3.5-5.1   Chloride             [L]  91 mEq/L                    96-112   Carbon Dioxide            29 mEq/L                    19-32   Glucose                   97 mg/dL                    04-54   BUN  15 mg/dL                    2-72   Creatinine                0.7 mg/dL                   5.3-6.6   Calcium                   9.5 mg/dL                   4.4-03.4   GFR                       86.02 mL/min                >60  TSH (TSH)   FastTSH                   0.73 uIU/mL                 0.35-5.50  UDip w/Micro (URINE)   Color                     LT. YELLOW   Clarity                   CLEAR                       Clear   Specific Gravity          1.015                       1.000 - 1.030   Urine Ph                  6.0                         5.0-8.0   Protein                    NEGATIVE                    Negative   Urine Glucose             NEGATIVE                    Negative   Ketones                   NEGATIVE                    Negative   Urine Bilirubin           NEGATIVE                    Negative   Blood                     TRACE-LYSED                 Negative   Urobilinogen              0.2                         0.0 - 1.0   Leukocyte Esterace  SMALL                       Negative   Nitrite                   NEGATIVE                    Negative   Urine WBC                 0-2/hpf                     0-2/hpf   Urine Mucus               Presence of                 None   Urine Epith               Rare(0-4/hpf)               Rare(0-4/hpf)   Hyaline Casts             Presence of                 None  Culture, Urine (40981) ! COLONY COUNT:             4,000 COLONIES/ML ! FINAL REPORT       RESULT: Insignificant Growth  Impression & Recommendations:  Problem # 1:  CHRONIC OBSTRUCTIVE ASTHMA UNSPECIFIED (ICD-493.20) She needs to continue the [facility] 1 tab Qid w/ fluids...  Problem # 2:  HYPERTENSION (ICD-401.9) We decided to change the diovan/Hct to Losartan 50mg /d... this should help her yponatremia as well... The following medications were removed from the medication list:    Diovan Hct 160-12.5 Mg Tabs (Valsartan-hydrochlorothiazide) .Marland Kitchen... Take 1 tablet by mouth once a day Her updated medication list for this problem includes:    Amlodipine Besylate 5 Mg Tabs (Amlodipine besylate) .Marland Kitchen... Take 1 tab by mouth once daily...    Losartan Potassium 50 Mg Tabs (Losartan potassium) .Marland Kitchen... Take 1 tab by mouth once daily...  Orders: TLB-BMP (Basic Metabolic Panel-BMET) (80048-METABOL) TLB-TSH (Thyroid Stimulating Hormone) (84443-TSH) TLB-Udip w/ Micro (81001-URINE) T-Urine Culture (Spectrum Order) [id number])  Problem # 3:  CEREBROVASCULAR DISEASE (ICD-437.9) Continue ASA daily...  Problem # 4:  HYPERCHOLESTEROLEMIA (ICD-272.0) She  thinks the Simva is making her weak & we agreed to 1 month trial off the med... if no better then she will restart. Her updated medication list for this problem includes:    Zocor 40 Mg Tabs (Simvastatin) .Marland Kitchen... *** hold ***  Problem # 5:  UNSPECIFIED DISORDER OF THYROID (ICD-246.9) Her TSH has been sl elevated & c/o "cold" but her weight is down & while it has stabilized at 116# range I am reluctant to start synthroid unless her TSH is progressively rising...  Problem # 6:  Hx of UTI (ICD-599.0) We will recheck her UA & C&S >> neg.  Problem # 7:  ANEMIA (ICD-285.9) Hosp labs reviewed... she remains on MVI + B12 shots monthly...  Problem # 8:  OTHER MEDICAL PROBLEMS AS NOTED>>>  Complete Medication List: 1)  Mucinex 600 Mg Xr12h-tab (Guaifenesin) .... Take 1-2 tabs by mouth two times a day w/ plenty of fluids.Marland KitchenMarland Kitchen 2)  Adult Aspirin Low Strength 81 Mg Tbdp (Aspirin) .... Take 1 tablet by mouth once a day 3)  Amlodipine Besylate 5 Mg Tabs (Amlodipine besylate) .... Take 1 tab by mouth  once daily.Marland KitchenMarland Kitchen 4)  Losartan Potassium 50 Mg Tabs (Losartan potassium) .... Take 1 tab by mouth once daily.Marland KitchenMarland Kitchen 5)  Klor-con M20 20 Meq Tbcr (Potassium chloride crys cr) .... Take 1 tablet by mouth once a day 6)  Zocor 40 Mg Tabs (Simvastatin) .... *** hold *** 7)  Prevacid 30 Mg Cpdr (Lansoprazole) .... Take 1 tablet by mouth once a day 8)  Womens Multivitamin Plus Tabs (Multiple vitamins-minerals) .... Take 1 tab by mouth once daily.Marland KitchenMarland Kitchen 9)  Vitamin D 1000 Unit Tabs (Cholecalciferol) .... Take 1 tablet by mouth once a day  Patient Instructions: 1)  Today we updated your med list- see below.... 2)  We decided to STOP the Diovan/ HCT pill & replace it w/ a less expensive & milder BP med= LOSARTAN 50mg - take one tab daily.Marland KitchenMarland Kitchen 3)  We also decided to put a *** HOLD *** on your Simvastatin for now. 4)  Today we did your follow up blood work & urine test... please call the "phone tree" in a few days for your lab  results.Marland KitchenMarland Kitchen 5)  Call for any problems.Marland KitchenMarland Kitchen 6)  Please schedule a follow-up appointment in 3 months. Prescriptions: LOSARTAN POTASSIUM 50 MG TABS (LOSARTAN POTASSIUM) take 1 tab by mouth once daily...  #30 x 11   Entered and Authorized by:   Michele Mcalpine MD   Signed by:   Michele Mcalpine MD on 04/11/2010   Method used:   Print then Give to Patient   RxID:   [id number]

## 2010-09-14 NOTE — Medication Information (Signed)
Summary: Nonadherence/[employer]  Nonadherence/[employer]   Imported By: Lester Dublin 09/10/2009 11:45:35  _____________________________________________________________________  External Attachment:    Type:   Image     Comment:   External Document

## 2010-09-14 NOTE — Miscellaneous (Signed)
Summary: Flu Vaccination/[facility]  Flu Vaccination/[facility]   Imported By: Sherian Rein 05/16/2010 10:44:42  _____________________________________________________________________  External Attachment:    Type:   Image     Comment:   External Document

## 2010-09-14 NOTE — Progress Notes (Signed)
Summary: question colon  Phone Note From Other Clinic Call back at [phone]   Caller: dr Arlana Hove Call For: nadel Summary of Call: should pt have colon or has she aged out Initial call taken by: Lacinda Axon,  February 15, 2010 12:56 PM  Follow-up for Phone Call        pls ask dr Kriste Basque if he thinks this pt should have a colon done dr Marlane Hatcher office is asking   Philipp Deputy CMA  February 15, 2010 1:26 PM   Additional Follow-up for Phone Call Additional follow up Details #1::        per SN---recs are to check stool cards and forgo the colon if negative.  thanks Randell Loop CMA  February 15, 2010 2:28 PM   called LM on barbara's VM w/ Dr. Marlane Hatcher office.  verbally gave SN's recs as stated above and encouraged her to call w/ any questions.  will sign off on msg. Boone Master CNA/MA  February 15, 2010 2:51 PM

## 2010-09-14 NOTE — Progress Notes (Signed)
Summary: nos appt  Phone Note Call from Patient   Caller: [email or web address]  Call For: nadel Summary of Call: In ref to nos from 11/28, pt has appt on 12/5. Initial call taken by: Darletta Moll,  July 11, 2010 9:16 AM

## 2010-09-14 NOTE — Assessment & Plan Note (Signed)
Summary: 6 month follow up/rsc from 11/25/la   CC:  3 month ROV & post hosp check....  History of Present Illness: 77 y/o WF here for a follow up of her mult med problems including Chr Sinusitis, HBP, ASPVD, Chol, etc...   ~  Jan 04, 2010:  she has lost 5# down to 117# but has good appetite & eating well, she says... we discussed nutritional supplements like carnation instant & she will try this once or twice daily...  she has chr sinus symptoms;  BP controlled on meds;  clinically euthyroid despite sl elev TSH;  she remains on monthly B12 shots...   ~  April 11, 2010:  she was hosp by [facility] 03/27/10 after "presyncopal episode" at [facility]- weak, dizzy, no LOC etc... also found to have UTI (sens EColi- treated in hosp), & BP meds adusted- reported to be sl postural, but she misunderstood & back on same meds as before> we discussed stopping her Diovan/Hct & change to Losartan 50mg /d... hosp records reviewed: no arrhythmia on monitor; CXR w/ COPD, calcif ThorAo, prev CSpine fusion, NAD;  sl anemic w/ Hg=10-11; mild electrolyte abn w/ Na=128, K=3.2.Marland KitchenMarland Kitchen  NOTE: pt had similar dizzy spell 4/09 while at [facility] and was eval in the ER:  BP was 150/74;  BS was 115 & labs WNL;  CT Head w/ atrophy, sm vessel dis, chr sinus dis, NAD;  EKG w/ NSR, NAD & monitor without arrhthmias...     ~  July 17, 2010:  she was hosp 10-27 to 06/10/10 by [facility] w/ constipation & fecal impaction> her GI is DrMagod;  CT Abd showed norm GB, sm bilat renal cysts, prev hyst, rectal wall thickening c/w proctitis;  Labs OK x mild anemia w/ Hg ~10, borderline hypothy;  she was disimpacted & re-started on her bowel regimen of MIRALAX & SENAKOT-S...  she is back to baseline but weak, wt down to 110# & asked to incr the supplements to Bid... chjr sinus prob w/o change;  BP controlled & stable on meds;  denies cerebral ischemic symptoms;  see prob list below>>>   Current Problem List:  SINUSITIS, CHRONIC (ICD-473.9) - Long Hx chronic sinus problems -  Eval & Rx by DrShoemaker w/ hx epistaxis, nasal septal erosion w/ prev "button";  and nasal polyps... she uses Saline, Mucinex, etc... she notes mild chr cough from the drainage- we reviewed chronic nasal regimen & rec MUCINEX DM 2Bid...  HYPERTENSION (ICD-401.9) - controlled on AMLODIPINE 5mg /d & LOSARTAN 50mg /d & K20 daily... BP today = 140/62, no postural changes... she denies HA, visual symptoms, CP, palpit, change in SOB, edema, etc...   ~  Baseline EKG w/ NSR, WNL;    ~  2D ECHO 2001 was normal.  ~  CXR 11/07 w/ Biapical pl thickening, NAD... CXR 9/09 showed COPD w/ incr markings, NAD... f/u CXR 5/11 showed COPD, NAD...   ~  11/10: c/o weak in AM- decision to decr the Amlodipine to 5mg /d & BP has been stable on this.  ~  8/11: brief hosp by [facility] w/ "presyncope" & meds adjusted but she restarted prev Rx on discharge... we decided to stop the Diovan/Hct in favor of LOSARTAN 50mg /d.  CEREBROVASCULAR DISEASE (ICD-437.9) - on ASA 81mg /d... Prev eval for syncope (related to postural BP changes) w/ MRI showing small vessel dz;  she has bilat CBruits w/ Carotid Dopplers in 2001 showing mild plaque, no signif ICA stenoses...  ~  she denies cerebral ischemic symptoms on the ASA 81mg /d.Marland KitchenMarland Kitchen  VENOUS INSUFFICIENCY (ICD-459.81) - on low sodium diet + support hose as needed.  HYPERCHOLESTEROLEMIA (ICD-272.0) - on SIMVASTATIN 40mg /d but she thinks this makes her weak & she wants to stop.  ~  FLP 12/08 showed TChol 204, TG 93, HDL 67, LDL 106  ~  FLP 9/09 showed TChol 183, TG 53, HDL 68, LDL 105  ~  FLP 8/10 showed TChol 224, TG 78, HDL 67, LDL 137... rec> take med regularly & work on diet.  ~  FLP 5/11 showed TChol 187, TG 80, HDL 67, LDL 104  ~  8/11: she thinks the Simva40 makes her weak & she wants to put it on HOLD.  UNSPECIFIED DISORDER OF THYROID (ICD-246.9) - she has a sl elevated TSH which we are following...  ~  labs 9/09 showed TSH= 7.31  ~  labs 1/10 showed TSH= 5.40  ~  labs 8/10 showed TSH=  5.08... she does c/o being "cold"  ~  labs 5/11 showed TSH= 6.11... she notes that her energy is "pretty fair"  ~  labs 8/11 showed TSH= 0.73... wt= 116# & stable over the last 68mo.  GERD (ICD-530.81) - PPI changed to generic PREVACID 30mg /d... Known HH & GERD w/ EGD from [facility] 8/01 & 2/06 showing HH, GERD, erosions, & gastric polyp...   ~  EGD 12/08 w/ savory dilatation... she is encouraged to f/u w/ DrMagod if symptoms occur...  ~  f/u EGD w/ dilatation 11/11 by [facility] for dysphagia symptoms showed tort esoph, sm HH, tiny gastric polyps, Savory dil done.  CONSTIPATION (ICD-564.00) - Hx severe constipation w/ fecal impaction req hosp 2/03;  last colon = 2/06 DrMagod w/ hemorrhoids & sm polyps... she uses MIRALAX daily...  ~  [facility] 10/11 w/ constip & fecal impaction> disimpacted & started back on MIRALAX & SENAKOT-S...  UTI (ICD-599.0) - Hx chronic UTI's Rx by Urology Jone[facility] Boers w/ Macrodantin in the past...  ~  8/11: hosp by [facility] w/ UTI- sens EColi treated w/ Rocephin/ then Septra.  DEGENERATIVE JOINT DISEASE (ICD-715.90) - she uses Tylenol/ Advil as needed...  ~  5/09:  she fell and broke her right wrist- s/p ORIF 6/09 by DrWeingold  DEGENERATIVE DISC DISEASE, CERVICAL SPINE (ICD-722.4) - CSpine surg by DrJenkins 1998...  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  ANEMIA (ICD-285.9), & VITAMIN B12 DEFICIENCY (ICD-266.2) - Hx mild anemia   ~  labs 6/08 showed Hg= 10.5  ~  labs 12/08 showed Hg= 12.3, Fe= 92  ~  labs 9/09 showed Hg= 11.8, MCV=89  ~  labs 1/10 showed Hg= 12.1, MCV= 89, Fe= 83, B12= 186... started on B12 shots monthly.  ~  labs 8/10 showed Hg= 12.2, MCV= 92, Fe= 87, B12= 470... continue Rx.  ~  labs 5/11 showed Hg= 11.2, MCV= 88  ~  labs 8/11 in hosp showed Hg= 10-11 range.  ~  labs 10/11 in hosp showed Hg= 10-11 range... asked to start Femiron one tab daily.  Hx SHINGLES - right  T9-10 distrib 6/10 & treated w/ Famvir, Medrol, [id number]...   Preventive Screening-Counseling & Management  Alcohol-Tobacco     Smoking Status: never  Allergies: 1)  ! * Asprin  Comments:  Nurse/Medical Assistant: The patient's medications and allergies were reviewed with the patient and were updated in the Medication and Allergy Lists.  Past History:  Past Medical History: SINUSITIS, CHRONIC (ICD-473.9) HYPERTENSION (ICD-401.9) CEREBROVASCULAR DISEASE (ICD-437.9) VENOUS INSUFFICIENCY (  ICD-459.81) HYPERCHOLESTEROLEMIA (ICD-272.0) UNSPECIFIED DISORDER OF THYROID (ICD-246.9) GERD (ICD-530.81) CONSTIPATION (ICD-564.00) Hx of UTI (ICD-599.0) DEGENERATIVE JOINT DISEASE (ICD-715.90) DEGENERATIVE DISC DISEASE, CERVICAL SPINE (ICD-722.4) CLOSED FRACTURE OF OTHER BONE OF WRIST (ICD-814.09) ANXIETY (ICD-300.00) Hx of IDIOPATHIC URTICARIA (ICD-708.1) ANEMIA (ICD-285.9) VITAMIN B12 DEFICIENCY (ICD-266.2) SHINGLES (ICD-053.9)  Past Surgical History: S/P Ant Cervical discectomy, fusion, & plating 11/99 by DrJenkins Hysterectomy & Bilat Salpingo-oopherectomy Tonsillectomy Nasal Surg Cataract extraction - S/P Bilat Cataract Surg by Hazle Nordmann 2005  Family History: Reviewed history from 04/11/2010 and no changes required. Father died at age 66 from diabetes complications Mother died at age 9 from heart failure 3 siblings: 1 sister died at age 12 from diabetes complications 1 sister died at age 60 from diabetes complications 1 sister died at age 41 from diabetes complications  Social History: Reviewed history from 04/11/2010 and no changes required. retired pt is widowed  3 children non-smoker no alcohol  Review of Systems      See HPI       The patient complains of weight loss, decreased hearing, dyspnea on exertion, headaches, severe indigestion/heartburn, muscle weakness, and difficulty walking.  The patient denies anorexia, fever, weight gain, vision loss, hoarseness,  chest pain, syncope, peripheral edema, prolonged cough, hemoptysis, abdominal pain, melena, hematochezia, hematuria, incontinence, suspicious skin lesions, transient blindness, depression, unusual weight change, abnormal bleeding, enlarged lymph nodes, and angioedema.    Vital Signs:  Patient profile:   77 year old female Height:      66 inches Weight:      110 pounds BMI:     17.82 O2 Sat:      98 % on Room air Temp:     96.8 degrees F oral Pulse rate:   73 / minute BP sitting:   140 / 62  (right arm) Cuff size:   regular  Vitals Entered By: Randell Loop CMA (July 17, 2010 2:45 PM)  O2 Sat at Rest %:  98 O2 Flow:  Room air CC: 3 month ROV & post hosp check... Is Patient Diabetic? No Pain Assessment Patient in pain? no      Comments meds updated today with pt   Physical Exam  Additional Exam:  WD, WN, 76 y/o WF in NAD... she is chr ill appearing... wt down to 110# GENERAL:  Alert & oriented; pleasant & cooperative... HEENT:  [place]/AT, EOM-wnl, PERRLA, EACs- sl wax, TMs-wnl, NOSE- nasal septal perf, THROAT- sl red +drainage... NECK:  Supple w/ decrROM; no JVD; normal carotid impulses w/ 1+bruits; no thyromegaly or nodules palpated; no lymphadenopathy. CHEST:  Clear to P & A; without wheezes or rales, but scat rhonchi heard... HEART:  Regular Rhythm;  gr1/6 SEM without rubs or gallops detected... ABDOMEN:  Soft & nontender; normal bowel sounds; no organomegaly or masses palpated... EXT: without deformities, mild arthritic changes; no varicose veins/ +venous insuffic/ no edema. NEURO:  CN's intact;  no focal neuro deficits, she is weak diffusely... DERM: scarring in the right T9-10 distrib of her prev shingles eruption...    MISC. Report  Procedure date:  07/17/2010  Findings:      DAT REVIEWED:  ~  Hosp 10/11> H&P, DCSummary, XRays, Labs, etc...   ~  Office f/u note from [facility] 06/30/10...  ~  EGD report from 07/04/10...   Impression & Recommendations:  Problem  # 1:  SINUSITIS, CHRONIC (ICD-473.9) No change>  continue saline lavages... Her updated medication list for this problem includes:    Mucinex 600 Mg Xr12h-tab (Guaifenesin) .Marland Kitchen... Take 1-2  tabs by mouth two times a day w/ plenty of fluids.Marland Kitchenas needed  Problem # 2:  CHRONIC OBSTRUCTIVE ASTHMA UNSPECIFIED (ICD-493.20) Stable>  no recent exac...  Problem # 3:  HYPERTENSION (ICD-401.9) Controlled>  same meds. Her updated medication list for this problem includes:    Amlodipine Besylate 5 Mg Tabs (Amlodipine besylate) .Marland Kitchen... Take 1 tab by mouth once daily...    Losartan Potassium 50 Mg Tabs (Losartan potassium) .Marland Kitchen... Take 1 tab by mouth once daily...  Problem # 4:  CEREBROVASCULAR DISEASE (ICD-437.9) No signs of cerebral ischemia... continue ASA.  Problem # 5:  CONSTIPATION (ICD-564.00)  Followed by [facility] for GI> on Miralax, Senakot-S & off the prev Colase & Amitiza...  Her updated medication list for this problem includes:    Miralax Powd (Polyethylene glycol 3350) .Marland Kitchen... 1 capful in water once or twice daily...    Senokot S 8.6-50 Mg Tabs (Sennosides-docusate sodium) .Marland Kitchen... 1-2 tabs by mouth at bedtime...  Problem # 6:  GERD (ICD-530.81) S/p EGD & dilatation> now back on Prevacid Rx... Her updated medication list for this problem includes:    Prevacid 30 Mg Cpdr (Lansoprazole) .Marland Kitchen... Take 1 tablet by mouth two times a day  Problem # 7:  OTHER MEDICAL ISSUES AS NOTED>>>  Complete Medication List: 1)  Mucinex 600 Mg Xr12h-tab (Guaifenesin) .... Take 1-2 tabs by mouth two times a day w/ plenty of fluids.Marland Kitchenas needed 2)  Adult Aspirin Low Strength 81 Mg Tbdp (Aspirin) .... Take 1 tablet by mouth once a day 3)  Amlodipine Besylate 5 Mg Tabs (Amlodipine besylate) .... Take 1 tab by mouth once daily.Marland KitchenMarland Kitchen 4)  Losartan Potassium 50 Mg Tabs (Losartan potassium) .... Take 1 tab by mouth once daily.Marland KitchenMarland Kitchen 5)  Klor-con M20 20 Meq Tbcr (Potassium chloride crys cr) .... Take 1 tablet by mouth once a day 6)   Zocor 40 Mg Tabs (Simvastatin) .... *** hold *** 7)  Prevacid 30 Mg Cpdr (Lansoprazole) .... Take 1 tablet by mouth two times a day 8)  Womens Multivitamin Plus Tabs (Multiple vitamins-minerals) .... Take 1 tab by mouth once daily.Marland KitchenMarland Kitchen 9)  Vitamin D 1000 Unit Tabs (Cholecalciferol) .... Take 1 tablet by mouth once a day 10)  Femiron 63 (20 Fe) Mg Tabs (Ferrous fumarate) .... Take 1 tab by mouth once daily... 11)  Miralax Powd (Polyethylene glycol 3350) .Marland Kitchen.. 1 capful in water once or twice daily... 12)  Senokot S 8.6-50 Mg Tabs (Sennosides-docusate sodium) .Marland Kitchen.. 1-2 tabs by mouth at bedtime...  Other Orders: Vit B12 1000 mcg (J3420) Admin of Therapeutic Inj  intramuscular or subcutaneous (16109)  Patient Instructions: 1)  Today we updated your med list- see below.... 2)  Continue your current meds the same, plus add in an iron tab eg- Femiron one daily.Marland KitchenMarland Kitchen 3)  Be sure to watch your bowel movements & use the MIRALAX + SENAKOT-S as directed for constipation.Marland KitchenMarland Kitchen 4)  Let's plan a follow up visit in 3-4 months w/ FASTING blood work at that time...   Immunization History:  Influenza Immunization History:    Influenza:  historical (05/23/2010)   Medication Administration  Injection # 1:    Medication: Vit B12 1000 mcg    Diagnosis: VITAMIN B12 DEFICIENCY (ICD-266.2)    Route: IM    Site: R deltoid    Exp Date: 12/2011    Lot #: [id number]    Mfr: [employer]    Patient tolerated injection without complications    Given by: Randell Loop CMA (July 17, 2010  4:17 PM)  Orders Added: 1)  Est. Patient Level IV [78295] 2)  Vit B12 1000 mcg [J3420] 3)  Admin of Therapeutic Inj  intramuscular or subcutaneous [62130]

## 2010-09-14 NOTE — Progress Notes (Signed)
Summary: speak to nurse  Phone Note Call from Patient Call back at [phone]   Caller: sister//myrtle leonard Call For: nadel Reason for Call: Talk to Nurse Summary of Call: Says her sister fell yesterday and hurt her foot, it's swollen and she can't walk on it, wants to know how to proceed please advise. Initial call taken by: Darletta Moll,  October 24, 2009 9:21 AM  Follow-up for Phone Call        Spoke with pt's sister Margaret [name].  She states that pt's foot is very swollen after having fall yesterday and that pt is unable to put any wt on foot.  I advised that she take her to ER for eval.  Pt's sister verbalized understanding. Follow-up by: Vernie Murders,  October 24, 2009 9:45 AM

## 2010-09-14 NOTE — Letter (Signed)
Summary: [facility]  [facility]   Imported By: Sherian Rein 07/17/2010 08:43:23  _____________________________________________________________________  External Attachment:    Type:   Image     Comment:   External Document

## 2010-09-14 NOTE — Assessment & Plan Note (Signed)
Summary: 6 months/apc   CC:  6 month ROV & review of mult medical problems....  History of Present Illness: 77 y/o WF here for a follow up of her mult med problems including Chr Sinusitis, HBP, ASPVD, Chol, etc...   ~  May09:  she fell and broke her right wrist- s/p ORIF 6/09 by DrWeingold... she also had a dizzy spell 11/20/07 while at [facility] and was eval in the ER- notes reviewed:  BP was 150/74;  BS was 115 & labs WNL;  CT Head w/ atrophy, sm vessel dis, chr sinus dis, NAD;  EKG w/ NSR, NAD & monitor without arrhthmias...    ~  Sep09:  c/o persist sinus/ nasal congestion & Rx w/ Saline, Mucinex, etc... she has a knot on her left arm= lipoma & she is reassured... she notes an intermittent noise w/ swallowing (but no dysphagia) & I offered Ba swallow for further eval but she refuses... she is quite anxious but refuses nerve meds.  ~  Jan10:  c/o being cold all the time- review of labs 9/09 shows mild anemia w/ Hg= 11.8 & TSH= 7.3.Marland KitchenMarland Kitchen we will repeat lab and anemia work-up to see if there is a trend (none found- Hg= 12.1 & TSH= 5.40)... Vit D level= 26 and she takes OTC 1000 u daily now... otherw feeling about the same...   ~  Jun10:  seen by TP 6/3 w/ pain in right side & shoulder blade area- no rash, treated expectantly & told to watch for rash which broke out 3d later- called DrYoung on call & given Zovirax & DCN100... only min improvement w/ persist blisters, few crusting, mod discomfort in rightT9-T10 distrib w/ rash all the way around...** we retreated w/ Famvir, Medrol, DCN100>>> improved.  ~  Aug10:  shingles has resolved- left w/ scarring in the right T9-10 distrib & some itching... otherw feeling OK- just weak, slowing down, etc... lives in own home & 25y/o grandson lives w/ her and helps...  ~  Nov10:  generally stable- c/o feeling weak in the AM and BP today= 110/60... decided to decr the Amlodipine from 10 to 5mg /d...  OK 2010 Flu shot today...   ~  Jan 04, 2010:  she has lost 5# down to 117#  but has good appetite & eating well, she says... we discussed nutritional supplements like carnation instant & she will try this once or twice daily...  she has chr sinus symptoms;  BP controlled on meds;  clinically euthyroid;  she remains on monthly B12 shots...   Current Problem List:  SINUSITIS, CHRONIC (ICD-473.9) - Long Hx chronic sinus problems - Eval & Rx by DrShoemaker w/ hx epistaxis, nasal septal erosion w/ prev "button";  and nasal polyps... she uses Saline, Mucinex, etc... she notes mild chr cough from the drainage- we reviewed chronic nasal regimen & rec MUCINEX DM 2Bid...  HYPERTENSION (ICD-401.9) - controlled on AMLODIPINE 5mg /d & DIOVAN/HCT 160-12.5 daily & K20 daily... BP today = 132/60, no postural changes... she denies HA, visual symptoms, CP, palpit, change in SOB, edema, etc...   ~  Baseline EKG w/ NSR, WNL;    ~  2D ECHO 2001 was normal.  ~  CXR 11/07 w/ Biapical pl thickening, NAD... CXR 9/09 showed COPD w/ incr markings, NAD... f/u CXR 5/11 showed COPD, NAD...   ~  11/10: c/o weak in AM- decision to decr the Amlodipine to 5mg /d & BP has been stable on this.  CEREBROVASCULAR DISEASE (ICD-437.9) - on ASA 81mg /d... Prev eval  for syncope (related to postural BP changes) w/ MRI showing small vessel dz;  she has bilat CBruits w/ Carotid Dopplers in 2001 showing mild plaque, no signif ICA stenoses...  VENOUS INSUFFICIENCY (ICD-459.81) - on low sodium diet + support hose as needed.  HYPERCHOLESTEROLEMIA (ICD-272.0) - on SIMVASTATIN 40mg /d...  ~  FLP 12/08 showed TChol 204, TG 93, HDL 67, LDL 106  ~  FLP 9/09 showed TChol 183, TG 53, HDL 68, LDL 105  ~  FLP 8/10 showed TChol 224, TG 78, HDL 67, LDL 137... rec> take med regularly & work on diet.  ~  FLP 5/11 showed TChol 187, TG 80, HDL 67, LDL 104  UNSPECIFIED DISORDER OF THYROID (ICD-246.9) - she has a sl elevated TSH which we are following...  ~  labs 9/09 showed TSH= 7.31  ~  labs 1/10 showed TSH= 5.40  ~  labs 8/10  showed TSH= 5.08  ~  labs 5/11 showed TSH= 6.11... she notes that her energy is "pretty fair"...  GERD (ICD-530.81) - PPI changed to generic PREVACID 30mg /d... Known HH & GERD w/ EGD from [facility] 8/01 & 2/06 showing HH, GERD, erosions, & gastric polyp... last EGD 12/08 w/ savory dilatation... she is encouraged to f/u w/ DrMagod if symptoms occur...  CONSTIPATION (ICD-564.00) - Hx severe constipation w/ fecal impaction req hosp 2/03;  last colon = 2/06 DrMagod w/ hemorrhoids & sm polyps... she uses MIRALAX daily...  UTI (ICD-599.0) - Hx chronic UTI's Rx by Urology Joneen Boers w/ Macrodantin in the past...  DEGENERATIVE JOINT DISEASE (ICD-715.90) - she uses Tylenol/ Advil as needed...  DEGENERATIVE DISC DISEASE, CERVICAL SPINE (ICD-722.4) - CSpine surg by DrJenkins 1998...  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  ANEMIA (ICD-285.9), & VITAMIN B12 DEFICIENCY (ICD-266.2) - Hx mild anemia   ~  labs 6/08 showed Hg= 10.5  ~  labs 12/08 showed Hg= 12.3, Fe= 92  ~  labs 9/09 showed Hg= 11.8, MCV=89  ~  labs 1/10 showed Hg= 12.1, MCV= 89, Fe= 83, B12= 186... started on B12 shots monthly.  ~  labs 8/10 showed Hg= 12.2, MCV= 92, Fe= 87, B12= 470... continue Rx.  ~  labs 5/11 showed Hg= 11.2, MCV= 88   Allergies: 1)  ! * Asprin  Comments:  Nurse/Medical Assistant: The patient's medications and allergies were reviewed with the patient and were updated in the Medication and Allergy Lists.  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 Father died at age 34 from diabetes complications Mother died at age 67 from heart failure 3 siblings: 1 sister died at age 32 from diabetes complications 1 sister died at age 90 from diabetes complications 1 sister died at age 33 from diabetes complications  Social History: Reviewed history from 04/30/2008 and no changes required. retired pt is widowed  3 children non-smoker no  alcohol  Review of Systems      See HPI       The patient complains of dyspnea on exertion.  The patient denies anorexia, fever, weight loss, weight gain, vision loss, decreased hearing, hoarseness, chest pain, syncope, peripheral edema, prolonged cough, headaches, hemoptysis, abdominal pain, melena, hematochezia, severe indigestion/heartburn, hematuria, incontinence, muscle weakness, suspicious skin lesions, transient blindness, difficulty walking, depression, unusual weight change, abnormal bleeding, enlarged lymph nodes, and angioedema.    Vital Signs:  Patient profile:   77 year old female Height:      66 inches Weight:      117 pounds BMI:     18.95 O2 Sat:      97 % on Room air Temp:     97.0 degrees F oral Pulse rate:   82 / minute BP sitting:   132 / 60  (left arm) Cuff size:   regular  Vitals Entered By: Randell Loop CMA (Jan 04, 2010 10:43 AM)  O2 Sat at Rest %:  97 O2 Flow:  Room air CC: 6 month ROV & review of mult medical problems... Is Patient Diabetic? No Pain Assessment Patient in pain? no      Comments  meds updated today---pt brought all of her meds today   Physical Exam  Additional Exam:  WD, WN, 77 y/o WF in NAD... she is chr ill appearing... GENERAL:  Alert & oriented; pleasant & cooperative... HEENT:  [place]/AT, EOM-wnl, PERRLA, EACs- sl wax, TMs-wnl, NOSE- nasal septal perf, THROAT- sl red +drainage... NECK:  Supple w/ decrROM; no JVD; normal carotid impulses w/ 1+bruits; no thyromegaly or nodules palpated; no lymphadenopathy. CHEST:  Clear to P & A; without wheezes/ rales/ or rhonchi heard... HEART:  Regular Rhythm;  gr1/6 SEM without rubs or gallops detected... ABDOMEN:  Soft & nontender; normal bowel sounds; no organomegaly or masses palpated... EXT: without deformities, mild arthritic changes; no varicose veins/ +venous insuffic/ no edema. NEURO:  CN's intact;  no focal neuro deficits, she is weak diffusely... DERM: scarring in the right T9-10 distrib of her prev shingles eruption...    CXR  Procedure date:  01/04/2010  Findings:      CHEST - 2 VIEW Comparison: 04/30/2008.   Findings: COPD/emphysema changes stable.  No active infiltrate.  Nom mass.  Normal cardiomediastinal silhouette.  Minimal anterior wedging of the T4 body is stable.   IMPRESSION: COPD.  No acute findings.  No interval change.   Read By:  Jonne Ply,  M.D.    MISC. Report  Procedure date:  01/04/2010  Findings:      BMP (METABOL)   Sodium               [L]  132 mEq/L                   135-145   Potassium                 4.2 mEq/L                   3.5-5.1   Chloride             [L]  92 mEq/L                    96-112   Carbon Dioxide            32 mEq/L  19-32   Glucose              [H]  100 mg/dL                   81-19   BUN                       12 mg/dL                    1-47   Creatinine                0.6 mg/dL                   8.2-9.5   Calcium                   10.0 mg/dL                  6.2-13.0   GFR                       97.39 mL/min                 >60  Hepatic/Liver Function Panel (HEPATIC)   Total Bilirubin           0.5 mg/dL                   8.6-5.7   Direct Bilirubin          0.1 mg/dL                   8.4-6.9   Alkaline Phosphatase      69 U/L                      39-117   AST                       26 U/L                      0-37   ALT                       14 U/L                      0-35   Total Protein             7.5 g/dL                    6.2-9.5   Albumin                   4.3 g/dL                    2.8-4.1  CBC Platelet w/Diff (CBCD)   White Cell Count          4.5 K/uL                    4.5-10.5   Red Cell Count       [L]  3.61 Mil/uL                 3.87-5.11   Hemoglobin           [L]  11.2 g/dL  12.0-15.0   Hematocrit           [L]  31.8 %                      36.0-46.0   MCV                       88.0 fl                     78.0-100.0   Platelet Count            296.0 K/uL                  150.0-400.0   Neutrophil %         [L]  39.1 %                      43.0-77.0   Lymphocyte %         [H]  50.3 %                      12.0-46.0   Monocyte %                9.5 %                       3.0-12.0   Eosinophils%              1.0 %                       0.0-5.0   Basophils %               0.1 %                       0.0-3.0  Comments:      TSH (TSH)   FastTSH              [H]  6.11 uIU/mL                 0.35-5.50  Lipid Panel (LIPID)   Cholesterol               187 mg/dL                   1-610   Triglycerides             80.0 mg/dL                  9.6-045.4   HDL                       09.81 mg/dL                 >19.14   LDL Cholesterol      [H]  782 mg/dL                   9-56   Impression & Recommendations:  Problem # 1:  SINUSITIS, CHRONIC (ICD-473.9) She has chr sinus problems>  continue Rx... Her updated medication list for this problem includes:    Mucinex 600 Mg Xr12h-tab (Guaifenesin) .Marland Kitchen... Take 1-2 tabs by mouth two times a day w/ plenty of fluids...  Orders: TLB-BMP  (Basic Metabolic Panel-BMET) (80048-METABOL) TLB-Hepatic/Liver Function Pnl (80076-HEPATIC) TLB-CBC Platelet - w/Differential (85025-CBCD) TLB-TSH (Thyroid Stimulating Hormone) (84443-TSH) TLB-Lipid  Panel (80061-LIPID)  Problem # 2:  CHRONIC OBSTRUCTIVE ASTHMA UNSPECIFIED (ICD-493.20) CXR w/ underlying COPD in this 77 y/o lady, no resp symptoms... Orders: T-2 View CXR (71020TC)  Problem # 3:  HYPERTENSION (ICD-401.9) Controlled>  same meds. Her updated medication list for this problem includes:    Amlodipine Besylate 5 Mg Tabs (Amlodipine besylate) .Marland Kitchen... Take 1 tab by mouth once daily...    Diovan Hct 160-12.5 Mg Tabs (Valsartan-hydrochlorothiazide) .Marland Kitchen... Take 1 tablet by mouth once a day  Problem # 4:  HYPERCHOLESTEROLEMIA (ICD-272.0) FLP looks better on regular dosing of her Statin Rx. Her updated medication list for this problem includes:    Zocor 40 Mg Tabs (Simvastatin) .Marland Kitchen... Take 1 tablet by mouth once a day  Problem # 5:  UNSPECIFIED DISORDER OF THYROID (ICD-246.9) She remains clinically euthyroid w/ borderline high TFT's... no on replacement Rx due to wt loss etc...  Problem # 6:  CONSTIPATION (ICD-564.00) GI is stable on regular dosing w/ her Miralax & Lantoprazole...  Problem # 7:  DEGENERATIVE JOINT DISEASE (ICD-715.90) Stable on OTC meds... The following medications were removed from the medication list:    Propoxacet-n 100-650 Mg Tabs (Propoxyphene n-apap) .Marland Kitchen... Take 1 tab by mouth three times a day as needed for pain... Her updated medication list for this problem includes:    Adult Aspirin Low Strength 81 Mg Tbdp (Aspirin) .Marland Kitchen... Take 1 tablet by mouth once a day  Problem # 8:  ANEMIA (ICD-285.9) Aware>  continue B12 monthly + MVI etc...  Complete Medication List: 1)  Mucinex 600 Mg Xr12h-tab (Guaifenesin) .... Take 1-2 tabs by mouth two times a day w/ plenty of fluids.Marland KitchenMarland Kitchen 2)  Adult Aspirin Low Strength 81 Mg Tbdp (Aspirin) .... Take 1 tablet by mouth once a  day 3)  Amlodipine Besylate 5 Mg Tabs (Amlodipine besylate) .... Take 1 tab by mouth once daily.Marland KitchenMarland Kitchen 4)  Diovan Hct 160-12.5 Mg Tabs (Valsartan-hydrochlorothiazide) .... Take 1 tablet by mouth once a day 5)  Klor-con M20 20 Meq Tbcr (Potassium chloride crys cr) .... Take 1 tablet by mouth once a day 6)  Zocor 40 Mg Tabs (Simvastatin) .... Take 1 tablet by mouth once a day 7)  Prevacid 30 Mg Cpdr (Lansoprazole) .... Take 1 tablet by mouth once a day 8)  Caltrate 600+d Plus 600-400 Mg-unit Tabs (Calcium carbonate-vit d-min) .... Take 1 tab by mouth once daily.Marland KitchenMarland Kitchen 9)  Womens Multivitamin Plus Tabs (Multiple vitamins-minerals) .... Take 1 tab by mouth once daily...  Other Orders: Vit B12 1000 mcg (J3420) Admin of Therapeutic Inj  intramuscular or subcutaneous (16109)  Patient Instructions: 1)  Today we updated your med list- see below.... 2)  Remember to take the OTC MUCINEX 1-2 tabs twice daily w/ fluids for the congestion.Marland KitchenMarland Kitchen  3)  Today we did your follow up CXR & FASTING blood work... please call the "phone tree" in a few days for your lab results.Marland KitchenMarland Kitchen 4)  Add some NUTRITIONAL SUPPLEMENTS> try Carnation instant breakfast (mix in blender w/ milk, ice cream & whatever you like!).Marland KitchenMarland Kitchen 5)  Call for any problems.Marland KitchenMarland Kitchen 6)  Please schedule a follow-up appointment in 6 months. Prescriptions: PREVACID 30 MG  CPDR (LANSOPRAZOLE) Take 1 tablet by mouth once a day  #30 x prn   Entered and Authorized by:   Michele Mcalpine MD   Signed by:   Michele Mcalpine MD on 01/04/2010   Method used:   Print then Give to Patient   RxID:   [id number]  Medication Administration  Injection # 1:    Medication: Vit B12 1000 mcg    Diagnosis: ANEMIA (ICD-285.9)    Route: IM    Site: R deltoid    Exp Date: 04-2011    Lot #: [id number]    Mfr: [employer] Regent    Patient tolerated injection without complications    Given by: Randell Loop CMA (Jan 04, 2010 11:37 AM)  Orders Added: 1)  Est. Patient Level IV [09811] 2)   T-2 View CXR [71020TC] 3)  TLB-BMP (Basic Metabolic Panel-BMET) [80048-METABOL] 4)  TLB-Hepatic/Liver Function Pnl [80076-HEPATIC] 5)  TLB-CBC Platelet - w/Differential [85025-CBCD] 6)  TLB-TSH (Thyroid Stimulating Hormone) [84443-TSH] 7)  TLB-Lipid Panel [80061-LIPID] 8)  Vit B12 1000 mcg [J3420] 9)  Admin of Therapeutic Inj  intramuscular or subcutaneous [91478]

## 2010-09-14 NOTE — Assessment & Plan Note (Signed)
Summary: B12 ///kp   Nurse Visit   Allergies: 1)  ! * Asprin  Medication Administration  Injection # 1:    Medication: Vit B12 1000 mcg    Diagnosis: 285.9    Route: SQ    Site: R deltoid    Exp Date: 09/2011    Lot #: [id number]    Mfr: [employer] Regent    Comments: 1000 MCG CHARGED [id number] AND [id number]    Given by: Drucie Opitz IN ALLERGY LAB   Medication Administration  Injection # 1:    Medication: Vit B12 1000 mcg    Diagnosis: 285.9    Route: SQ    Site: R deltoid    Exp Date: 09/2011    Lot #: [id number]    Mfr: [employer] Regent    Comments: 1000 MCG CHARGED [id number] AND [id number]    Given by: Drucie Opitz IN ALLERGY LAB

## 2010-09-14 NOTE — Progress Notes (Signed)
Summary: not eating and empacted  Phone Note Call from Patient Call back at [phone]   Caller: Daughter gloria Call For: nadel Summary of Call: pt have not eaten in 3 days and she is unable to have a bowel movement. Initial call taken by: Rickard Patience,  June 07, 2010 2:41 PM  Follow-up for Phone Call        called spoke with patient's daughter Malachi Bonds.  she states that pt has not had a BM x4-5days and has become uncomfortable within the past 3 days.  she states that pt has been taking miralax x3days and had an enema the last 2 days.  went to the ED yesterday and was disempacted but that was it.  stomach is distended, not eating, taking any meds or much fluids x3 days.  has spoken with Dr. Marlane Hatcher office who deferred her here.  please advise, thanks!  please after 4:30pm. Follow-up by: Boone Master CNA/MA,  June 07, 2010 3:37 PM  Additional Follow-up for Phone Call Additional follow up Details #1::        called and spoke with pts daughter and she stated that pt was seen in the er yesterday and that she has been on the phone all morning with the GI docs that told her to follow up with her primary care doc---when i called the daughter to give her SN recs---she explained that pt is in alot of pain and that her abd is distended and she is unable to keep anything down---explained to daughter that she will need to take pt to the ER for eval and possible admission.  pts daughter voiced her understanding of this Additional Follow-up by: Randell Loop CMA,  June 07, 2010 5:40 PM

## 2010-09-14 NOTE — Assessment & Plan Note (Signed)
Summary: b-12/apc   Nurse Visit   Allergies: 1)  ! * Asprin  Medication Administration  Injection # 1:    Medication: Vit B12 1000 mcg    Diagnosis: ANEMIA (ICD-285.9)    Route: IM    Site: R deltoid    Exp Date: 04/2011    Lot #: [id number]    Mfr: [employer] Regent    Comments: Injection given by Drucie Opitz, CMA in allergy lab.    Patient tolerated injection without complications  Orders Added: 1)  Admin of Therapeutic Inj  intramuscular or subcutaneous [96372] 2)  Vit B12 1000 mcg [J3420]

## 2010-10-18 ENCOUNTER — Telehealth: Payer: Self-pay | Admitting: Pulmonary Disease

## 2010-10-20 ENCOUNTER — Ambulatory Visit: Payer: Self-pay | Admitting: Pulmonary Disease

## 2010-10-24 NOTE — Progress Notes (Signed)
Summary: need to rsc appt from 3-9  Phone Note Outgoing Call   Summary of Call: called [facility] for pt ---need to reschedule her appt from 3-9 to 3-26----ok per SN--at 9:30----if pt wants to come friday for labs this will be ok--need to confirm 3-26 is ok Randell Loop CMA  October 18, 2010 3:04 PM   Follow-up for Phone Call        called and spoke with pt and she is ok with her appt on 3-26 and will do her labs at that appt Randell Loop CMA  October 19, 2010 8:46 AM

## 2010-10-25 LAB — BASIC METABOLIC PANEL
BUN: 3 mg/dL — ABNORMAL LOW (ref 6–23)
BUN: 7 mg/dL (ref 6–23)
Calcium: 9.3 mg/dL (ref 8.4–10.5)
Chloride: 96 mEq/L (ref 96–112)
Creatinine, Ser: 0.55 mg/dL (ref 0.4–1.2)
Creatinine, Ser: 0.58 mg/dL (ref 0.4–1.2)
GFR calc non Af Amer: >60 mL/min (ref >60)
Glucose, Bld: 87 mg/dL (ref 70–99)
Glucose, Bld: 90 mg/dL (ref 70–99)
Potassium: 3.1 mEq/L — ABNORMAL LOW (ref 3.5–5.1)
Potassium: 4 mEq/L (ref 3.5–5.1)

## 2010-10-25 LAB — CBC
HCT: 30 % — ABNORMAL LOW (ref 36.0–46.0)
HCT: 30.1 % — ABNORMAL LOW (ref 36.0–46.0)
HCT: 30.1 % — ABNORMAL LOW (ref 36.0–46.0)
Hemoglobin: 10.7 g/dL — ABNORMAL LOW (ref 12.0–15.0)
MCH: 29.4 pg (ref 26.0–34.0)
MCH: 30.1 pg (ref 26.0–34.0)
MCHC: 34.2 g/dL (ref 30.0–36.0)
MCHC: 34.7 g/dL (ref 30.0–36.0)
MCHC: 35.5 g/dL (ref 30.0–36.0)
MCV: 84.6 fL (ref 78.0–100.0)
MCV: 86 fL (ref 78.0–100.0)
Platelets: 170 10*3/uL (ref 150–400)
Platelets: 188 10*3/uL (ref 150–400)
Platelets: 198 10*3/uL (ref 150–400)
RDW: 14 % (ref 11.5–15.5)
RDW: 14.1 % (ref 11.5–15.5)
RDW: 14.2 % (ref 11.5–15.5)
WBC: 3.3 10*3/uL — ABNORMAL LOW (ref 4.0–10.5)
WBC: 3.5 10*3/uL — ABNORMAL LOW (ref 4.0–10.5)

## 2010-10-25 LAB — URINALYSIS, ROUTINE W REFLEX MICROSCOPIC
Nitrite: NEGATIVE
Protein, ur: NEGATIVE mg/dL
Specific Gravity, Urine: 1.006 (ref 1.005–1.030)
Urobilinogen, UA: 0.2 mg/dL (ref 0.0–1.0)

## 2010-10-25 LAB — COMPREHENSIVE METABOLIC PANEL
AST: 37 U/L (ref 0–37)
Albumin: 4.2 g/dL (ref 3.5–5.2)
Alkaline Phosphatase: 63 U/L (ref 39–117)
Calcium: 9.4 mg/dL (ref 8.4–10.5)
GFR calc non Af Amer: >60 mL/min (ref >60)
Total Bilirubin: 1 mg/dL (ref 0.3–1.2)
Total Protein: 7.2 g/dL (ref 6.0–8.3)

## 2010-10-25 LAB — DIFFERENTIAL
Basophils Absolute: 0 10*3/uL (ref 0.0–0.1)
Basophils Relative: 0 % (ref 0–1)
Eosinophils Absolute: 0 10*3/uL (ref 0.0–0.7)

## 2010-10-25 LAB — T4, FREE: Free T4: 0.75 ng/dL — ABNORMAL LOW (ref 0.80–1.80)

## 2010-10-25 LAB — SODIUM, URINE, RANDOM: Sodium, Ur: 40 mEq/L

## 2010-10-25 LAB — URINE MICROSCOPIC-ADD ON

## 2010-10-25 LAB — URINE CULTURE: Colony Count: NO GROWTH

## 2010-10-25 LAB — TSH: TSH: 5.794 u[IU]/mL — ABNORMAL HIGH (ref 0.350–4.500)

## 2010-10-26 LAB — POCT CARDIAC MARKERS
CKMB, poc: 2.2 ng/mL (ref 1.0–8.0)
Troponin i, poc: <0.05 ng/mL (ref 0.00–0.09)

## 2010-10-26 LAB — BASIC METABOLIC PANEL
BUN: 13 mg/dL (ref 6–23)
CO2: 23 mEq/L (ref 19–32)
Calcium: 9.5 mg/dL (ref 8.4–10.5)
Chloride: 95 mEq/L — ABNORMAL LOW (ref 96–112)
Creatinine, Ser: 0.68 mg/dL (ref 0.4–1.2)

## 2010-10-26 LAB — COMPREHENSIVE METABOLIC PANEL
ALT: 14 U/L (ref 0–35)
AST: 23 U/L (ref 0–37)
Albumin: 3.6 g/dL (ref 3.5–5.2)
Chloride: 100 mEq/L (ref 96–112)
Creatinine, Ser: 0.48 mg/dL (ref 0.4–1.2)
GFR calc Af Amer: >60 mL/min (ref >60)
Potassium: 3.2 mEq/L — ABNORMAL LOW (ref 3.5–5.1)
Sodium: 135 mEq/L (ref 135–145)
Total Bilirubin: 0.4 mg/dL (ref 0.3–1.2)

## 2010-10-26 LAB — CBC
MCH: 30.1 pg (ref 26.0–34.0)
MCH: 30.7 pg (ref 26.0–34.0)
MCV: 86 fL (ref 78.0–100.0)
Platelets: 169 10*3/uL (ref 150–400)
Platelets: 193 10*3/uL (ref 150–400)
RBC: 3.36 MIL/uL — ABNORMAL LOW (ref 3.87–5.11)
RDW: 13.9 % (ref 11.5–15.5)
WBC: 3.2 10*3/uL — ABNORMAL LOW (ref 4.0–10.5)

## 2010-10-26 LAB — URINE CULTURE
Colony Count: >=100000
Culture  Setup Time: 201108152016

## 2010-10-26 LAB — URINALYSIS, ROUTINE W REFLEX MICROSCOPIC
Nitrite: POSITIVE — AB
Protein, ur: 30 mg/dL — AB
Specific Gravity, Urine: 1.006 (ref 1.005–1.030)
Urobilinogen, UA: 1 mg/dL (ref 0.0–1.0)

## 2010-10-26 LAB — DIFFERENTIAL
Basophils Absolute: 0 10*3/uL (ref 0.0–0.1)
Basophils Relative: 0 % (ref 0–1)
Eosinophils Absolute: 0 10*3/uL (ref 0.0–0.7)
Eosinophils Relative: 1 % (ref 0–5)
Neutrophils Relative %: 60 % (ref 43–77)

## 2010-10-26 LAB — URINE MICROSCOPIC-ADD ON

## 2010-10-26 LAB — SODIUM, URINE, RANDOM: Sodium, Ur: 47 mEq/L

## 2010-10-26 LAB — URIC ACID: Uric Acid, Serum: 5.5 mg/dL (ref 2.4–7.0)

## 2010-10-26 LAB — OSMOLALITY, URINE: Osmolality, Ur: 231 mOsm/kg — ABNORMAL LOW (ref 390–1090)

## 2010-10-31 ENCOUNTER — Encounter: Payer: Self-pay | Admitting: Pulmonary Disease

## 2010-10-31 ENCOUNTER — Encounter: Payer: Self-pay | Admitting: *Deleted

## 2010-10-31 ENCOUNTER — Telehealth: Payer: Self-pay | Admitting: Pulmonary Disease

## 2010-10-31 NOTE — Telephone Encounter (Signed)
Will forward to Haynes Bast, MA

## 2010-11-02 ENCOUNTER — Encounter: Payer: Self-pay | Admitting: *Deleted

## 2010-11-02 NOTE — Telephone Encounter (Signed)
Jury letter has been done and sent back to the jury excusal.  SN is aware

## 2010-11-06 ENCOUNTER — Other Ambulatory Visit (INDEPENDENT_AMBULATORY_CARE_PROVIDER_SITE_OTHER): Payer: Medicare Other

## 2010-11-06 ENCOUNTER — Ambulatory Visit (INDEPENDENT_AMBULATORY_CARE_PROVIDER_SITE_OTHER): Payer: Medicare Other | Admitting: Pulmonary Disease

## 2010-11-06 ENCOUNTER — Encounter: Payer: Self-pay | Admitting: Pulmonary Disease

## 2010-11-06 ENCOUNTER — Other Ambulatory Visit (INDEPENDENT_AMBULATORY_CARE_PROVIDER_SITE_OTHER): Payer: Self-pay | Admitting: Pulmonary Disease

## 2010-11-06 DIAGNOSIS — E7800 Pure hypercholesterolemia, unspecified: Secondary | ICD-10-CM

## 2010-11-06 DIAGNOSIS — I10 Essential (primary) hypertension: Secondary | ICD-10-CM

## 2010-11-06 DIAGNOSIS — K5900 Constipation, unspecified: Secondary | ICD-10-CM

## 2010-11-06 DIAGNOSIS — E079 Disorder of thyroid, unspecified: Secondary | ICD-10-CM

## 2010-11-06 DIAGNOSIS — D649 Anemia, unspecified: Secondary | ICD-10-CM

## 2010-11-06 DIAGNOSIS — M1990 Unspecified osteoarthritis, unspecified site: Secondary | ICD-10-CM

## 2010-11-06 DIAGNOSIS — E785 Hyperlipidemia, unspecified: Secondary | ICD-10-CM

## 2010-11-06 LAB — CBC WITH DIFFERENTIAL/PLATELET
Basophils Absolute: 0 10*3/uL (ref 0.0–0.1)
Eosinophils Absolute: 0 10*3/uL (ref 0.0–0.7)
HCT: 34.1 % — ABNORMAL LOW (ref 36.0–46.0)
Lymphs Abs: 1.6 10*3/uL (ref 0.7–4.0)
MCV: 89.9 fl (ref 78.0–100.0)
Monocytes Absolute: 0.3 10*3/uL (ref 0.1–1.0)
Monocytes Relative: 8.6 % (ref 3.0–12.0)
Platelets: 210 10*3/uL (ref 150.0–400.0)
RDW: 14.4 % (ref 11.5–14.6)

## 2010-11-06 LAB — LIPID PANEL: HDL: 80.8 mg/dL (ref >39.00)

## 2010-11-06 LAB — HEPATIC FUNCTION PANEL
AST: 36 U/L (ref 0–37)
Albumin: 4.4 g/dL (ref 3.5–5.2)
Alkaline Phosphatase: 70 U/L (ref 39–117)

## 2010-11-06 LAB — BASIC METABOLIC PANEL
GFR: 87.37 mL/min (ref >60.00)
Glucose, Bld: 100 mg/dL — ABNORMAL HIGH (ref 70–99)
Potassium: 3.8 mEq/L (ref 3.5–5.1)
Sodium: 132 mEq/L — ABNORMAL LOW (ref 135–145)

## 2010-11-06 LAB — TSH: TSH: 9.85 u[IU]/mL — ABNORMAL HIGH (ref 0.35–5.50)

## 2010-11-06 LAB — LDL CHOLESTEROL, DIRECT: Direct LDL: 190.6 mg/dL

## 2010-11-06 MED ORDER — AZITHROMYCIN 250 MG PO TABS: ORAL_TABLET | ORAL | Status: AC

## 2010-11-06 NOTE — Progress Notes (Signed)
Subjective:    Patient ID: Margaret [name], female    DOB: Dec 26, 1923, 77 y.o.   MRN: [id number]  HPI 77 y/o WF here for a follow up visit... She has Chr Sinusitis;  HBP;  ASPVD;  Chol;  GERD/ Constip w/ hx impaction;  UTIs;  DJD/ DDD;  Hx anxiety;  Hx anemia...   ~  July 17, 2010:  she was hosp 10-27 to 06/10/10 by [facility] w/ constipation & fecal impaction> her GI is DrMagod;  CT Abd showed norm GB, sm bilat renal cysts, prev hyst, rectal wall thickening c/w proctitis;  Labs OK x mild anemia w/ Hg~10, borderline hypothy;  she was disimpacted & re-started on her bowel regimen of MIRALAX & SENAKOT-S...  she is back to baseline but weak, wt down to 110# & asked to incr the supplements to Bid... chjr sinus prob w/o change;  BP controlled & stable on meds;  denies cerebral ischemic symptoms;  see prob list below>>>  ~  November 06, 2010:  23mo ROV w/ review of mult chr problems & FASTING blood work today>  Review shows elev TSH= 9.85 & we will need to start her on synthroid 76mcg/d...    Chr sinusitis>  Hx allergies & chr sinus problems w/ polyps & septal perf prev eval by DrShoemaker;  Currently only uses Mucinex & Saline prn...    HBP>  Controlled on Amlodipine, Losartan, & KCl;  BP= 110/66, no postural changes & tol meds well;  Denies CP, palpit, SOB, dizzy, edema...    Cerebrovasc dis>  On ASA 81mg /d;  sm vessel dis on prev MRI & faint bilat CBruits w/ hx neg CDopplers in the past...    Chol>  Prev on Simva40 but stopped by the pt 8/11 due to "weak";  FLP today showed TChol 293, TG 90, HDL 81, LDL 191;  rec to start statin rx w/ Crestor20 (lower dose statin, hopefully better tol)...    GERD>  On Prevacid 30mg /d, known HH & followed by [facility] for GI w/ dilatation 11/11 & swallowing better...    Constip>  Hx severe constip w/ hosp for impaction 2003;  On Miralax daily 7 doing OK...    DJD>  Can't take NSAIDs well, uses Tylenol & prn Advil;  C/o some left ankle pain on & off...    Anxiety>  Not on  meds...    Anemia & B12 defic>  Takes Fe daily & B12 shots monthly;  Labs today showed Hg=12.0, MCV=90...  Past Medical History  Diagnosis Date  . Unspecified sinusitis (chronic)   . Unspecified essential hypertension   . Cerebrovascular disease, unspecified   . Unspecified venous (peripheral) insufficiency   . Pure hypercholesterolemia   . Unspecified disorder of thyroid   . Esophageal reflux   . Unspecified constipation   . Urinary tract infection, site not specified   . Osteoarthrosis, unspecified whether generalized or localized, unspecified site   . Degeneration of cervical intervertebral disc   . Closed fracture of other bone of wrist   . Anxiety state, unspecified   . Idiopathic urticaria   . Anemia, unspecified   . Other B-complex deficiencies   . Herpes zoster without mention of complication     Past Surgical History  Procedure Date  . Tonsillectomy   . Total abdominal hysterectomy w/ bilateral salpingoophorectomy   . Cervical fusion   . Cataract extraction   . Nasal sinus surgery     Outpatient Encounter Prescriptions as of 11/06/2010  Medication Sig Dispense  Refill  . amLODipine (NORVASC) 5 MG tablet Take 5 mg by mouth daily.        Marland Kitchen aspirin 81 MG tablet Take 81 mg by mouth daily.        . Cholecalciferol (VITAMIN D) 1000 UNITS capsule Take 1,000 Units by mouth daily.        Marland Kitchen guaiFENesin (MUCINEX) 600 MG 12 hr tablet Take 1,200 mg by mouth 2 (two) times daily.        . lansoprazole (PREVACID) 30 MG capsule Take 1 tablet by mouth two times a day       . losartan (COZAAR) 50 MG tablet Take 50 mg by mouth daily.        . Multiple Vitamin (MULTIVITAMINS PO) Take by mouth.        . polyethylene glycol (MIRALAX / GLYCOLAX) packet 1 capful in water once or twice daily       . potassium chloride SA (K-DUR,KLOR-CON) 20 MEQ tablet Take one tablet by mouth once a day       . sennosides-docusate sodium (SENOKOT-S) 8.6-50 MG tablet Take 1 tablet by mouth daily.        Marland Kitchen  azithromycin (ZITHROMAX) 250 MG tablet Take 2 tablets by mouth on day 1, followed by 1 tablet by mouth daily for 4 days. Take 2 tablets (500 mg) on  Day 1,  followed by 1 tablet (250 mg) once daily on Days 2 through 5.  6 each  0  . Ferrous Fumarate (FEMIRON) 63 (20 FE) MG TABS Take 20 mg by mouth daily.          No Known Allergies   Review of Systems        See HPI       The patient complains of weight loss, decreased hearing, dyspnea on exertion, headaches, severe indigestion/heartburn, muscle weakness, and difficulty walking.  The patient denies anorexia, fever, weight gain, vision loss, hoarseness, chest pain, syncope, peripheral edema, prolonged cough, hemoptysis, abdominal pain, melena, hematochezia, hematuria, incontinence, suspicious skin lesions, transient blindness, depression, unusual weight change, abnormal bleeding, enlarged lymph nodes, and angioedema.     Objective:   Physical Exam     WD, WN, 77 y/o WF in NAD... she is chr ill appearing... wt 112# GENERAL:  Alert & oriented; pleasant & cooperative... HEENT:  [place]/AT, EOM-wnl, PERRLA, EACs- sl wax, TMs-wnl, NOSE- nasal septal perf, THROAT- sl red +drainage... NECK:  Supple w/ decrROM; no JVD; normal carotid impulses w/ 1+bruits; no thyromegaly or nodules palpated; no lymphadenopathy. CHEST:  Clear to P & A; without wheezes or rales, but scat rhonchi heard... HEART:  Regular Rhythm;  gr1/6 SEM without rubs or gallops detected... ABDOMEN:  Soft & nontender; normal bowel sounds; no organomegaly or masses palpated... EXT: without deformities, mild arthritic changes; no varicose veins/ +venous insuffic/ no edema. NEURO:  CN's intact;  no focal neuro deficits, she is weak diffusely... DERM: scarring in the right T9-10 distrib of her prev shingles eruption...   Assessment & Plan:

## 2010-11-06 NOTE — Patient Instructions (Signed)
We upated your meds today... We decided to STOP the LOSARTAN BP med since your pressur is OK without it!  Today we did your follow up FASTING blood work... Please call the "phone tree" in a few days for your results... PHONE TREE:  Dial [phone] & enter your pt acct number when prompted followed by the # symbol> [phone]  Since you are no longer on the cholesterol medication- you need to be sure you follow a strict low cholesterol low fat diet... Call for any questions... Let's plan a follow up visit in 4 months.Marland KitchenMarland Kitchen

## 2010-11-10 ENCOUNTER — Telehealth: Payer: Self-pay | Admitting: *Deleted

## 2010-11-10 MED ORDER — ROSUVASTATIN CALCIUM 20 MG PO TABS: 20.0000 mg | ORAL_TABLET | Freq: Every day | ORAL | Status: DC

## 2010-11-10 MED ORDER — LEVOTHYROXINE SODIUM 75 MCG PO TABS: 75.0000 ug | ORAL_TABLET | Freq: Every day | ORAL | Status: DC

## 2010-11-10 NOTE — Telephone Encounter (Signed)
Called and reviewed lab results per SN---lip--293  ldl 191--off the simvastatin--recs are for her to try crestor 20mg  daily  #30 refill prn--other labs all ok and tsh elevated--thyroid is underactive and will make her feel bad/weak...recs to start on synthroid 0.69mcgg #30  Once daily with prn refills.  Pt is aware that this has been sent to her pharmacy--[facility].

## 2010-11-12 ENCOUNTER — Encounter: Payer: Self-pay | Admitting: Pulmonary Disease

## 2010-11-12 NOTE — Assessment & Plan Note (Signed)
Hg is table> continue B12 monthly & FeSO4 one daily.Marland KitchenMarland Kitchen

## 2010-11-12 NOTE — Assessment & Plan Note (Signed)
TSH is now up to 9.85 & we will have to start synthroid Rx (start 58mcg/d).Marland KitchenMarland Kitchen

## 2010-11-12 NOTE — Assessment & Plan Note (Signed)
She uses Tylenol & occas Advil as needed.Marland KitchenMarland Kitchen

## 2010-11-12 NOTE — Assessment & Plan Note (Signed)
She was prev on simva40 but stopped on her own 8/11 due to "weakness";  Not a whole lot better off this med she admits but reluctant to restart;  Labs today showed incr TChol & LDL> rec to start Crestor 20mg /d as trial..Marland Kitchen

## 2010-11-12 NOTE — Assessment & Plan Note (Signed)
She is stable on the laxatives 7 denies recurrent constipation.Marland KitchenMarland Kitchen

## 2010-11-12 NOTE — Assessment & Plan Note (Signed)
BP now controlled on Amlodipine & Losartan>  BP 110/60 w/o postural changes;  Continue same.Marland KitchenMarland Kitchen

## 2010-12-26 NOTE — H&P (Signed)
NAMEIVONNA, KINNICK NO.:  [id number]   MEDICAL RECORD NO.:  [id number]          PATIENT TYPE:  INP   LOCATION:  5120                         FACILITY:  [facility]   PHYSICIAN:  Graylin Shiver, M.D.   DATE OF BIRTH:  25-Aug-1923   DATE OF ADMISSION:  10/15/2008  DATE OF DISCHARGE:  10/16/2008                              HISTORY & PHYSICAL   CHIEF COMPLAINT:  Rectal pain and constipation.   HISTORY OF PRESENT ILLNESS:  The patient is an 78 year old female who  came to the office today because of severe constipation and pain in the  rectal area.  The patient states that she has not had a bowel movement  in 3 days.  She strains but only some liquid comes out.  She has a lot  of pain in the rectal area and feels like it is deep inside her rectum.  She has a history of having had a fecal impaction in the past requiring  hospitalization for treatment.   CURRENT MEDICATIONS:  1. Urelle 81 mg t.i.d.  2. Klor-Con M 20 mEq twice a day.  3. Nitrofurantoin 100 mg 1 capsule daily (I spoke to the pharmacist      about this and he said it was contraindicated in this patient to      take this medication because of her age).  4. simvastatin 40 mg once every day.  5. Lansoprazole 30 mg once daily.  6. Diovan/HCT 160/12.5 mg daily.  7. Amlodipine besylate 10 mg daily.   HISTORY:  1. History of chronic constipation.  2. History of dysphagia.   ALLERGIES:  None.   SURGICAL HISTORY:  Hysterectomy, surgery on neck, and surgery on right  wrist.   SOCIAL HISTORY:  Does not smoke or drink alcohol.   REVIEW OF SYSTEMS:  No complaints of chest pain, shortness of breath,  cough, or sputum production.   PHYSICAL EXAMINATION:  VITAL SIGNS:  Temperature 97.1, weight 125, blood  pressure 142/84, and pulse 82.  GENERAL:  She was in no distress.  HEENT:  Nonicteric.  NECK:  Supple.  HEART:  Regular rhythm.  No murmurs.  LUNGS:  Clear.  ABDOMEN:  Bowel sounds normal.  Soft and  nontender.  No  hepatosplenomegaly.  RECTAL:  There was stool covering the perianal area externally.  On  digital exam, there was a large, hard fecal impaction in the rectum.  EXTREMITIES:  No edema.   IMPRESSION:  Fecal impaction.   PLAN:  The patient is being admitted to the hospital for manual  disimpaction, enemas, and laxatives.  I spoke to the patient and her  daughter who accompanied her today.  The patient does not feel that this  can be handled at home.  She stated that she did not want her daughter  to help her with this problem and felt that its process could be more  efficiently taken care of in the hospital.  I will therefore admit her  for this.  This could be a easy, simple solution or might take a few  days to clean her out.  ______________________________  Graylin Shiver, M.D.     SFG/MEDQ  D:  10/15/2008  T:  10/16/2008  Job:  [id number]

## 2010-12-26 NOTE — Discharge Summary (Signed)
[name], [name]               ACCOUNT NO.:  [id number]   MEDICAL RECORD NO.:  [id number]          PATIENT TYPE:  INP   LOCATION:  5120                         FACILITY:  [facility]   PHYSICIAN:  Bernette Redbird, M.D.   DATE OF BIRTH:  Dec 14, 1923   DATE OF ADMISSION:  10/15/2008  DATE OF DISCHARGE:  10/16/2008                               DISCHARGE SUMMARY   DISCHARGE DIAGNOSES:  1. Fecal impaction.  2. Chronic constipation.  3. History of dysphagia.   CONSULTATIONS:  None.   COMPLICATIONS:  None.   LABORATORY DATA:  None obtained.   PROCEDURES:  None.   HOSPITAL COURSE:  The patient presented to our office yesterday because  of severe constipation and perirectal pain.  Examination showed a large  hard fecal impaction.  The patient was somewhat uncomfortable with this  and therefore, was admitted to the hospital for management.  At the  hospital, she was treated with a combination of digital disimpaction,  Fleet enemas, and MiraLax and had good results.  An x-ray had shown a  reasonable amount of stool proximally with the majority of the problems  located distally.  She tolerated a regular diet and rectal exam on the  day of discharge showed an empty rectal ampulla with just some liquid  brown stool, so was felt that the objective of the hospitalization had  been achieved, and that discharge home was appropriate.   Prior to discharge, I spent approximately 20 out of 35 minutes on  discharge time talking with the family members and the patient about the  proper use of MiraLax, which she has used previously at home, but  without consistent use or optimal results.  I emphasized the need for  daily dosing and to titrate the dose to the desired effect.  The patient  seemed to have good understanding and her family members, one of whom  lives with her, also seemed to have very good understanding.   DISPOSITION:  The patient is discharged home on a regular diet with  instructions  to call our office for follow up with Dr. Ewing Schlein in 1-2  weeks.  She will resume her previous home medications, specifically  nitrofurantoin 100 mg b.i.d., Klor-Con 20 mEq daily, simvastatin 40 mg  daily, amlodipine 10 mg daily, lansoprazole 30 mg b.i.d., Diovan  HCT 160/12.5 once daily, Urelle p.r.n. 3 times a day.  She will also  resume the use of MiraLax, but on a regular daily basis, titrating the  dose as needed to desired bowel movements.   CONDITION ON DISCHARGE:  Improved.           ______________________________  Bernette Redbird, M.D.     RB/MEDQ  D:  10/16/2008  T:  10/17/2008  Job:  [id number]   cc:   Lonzo Cloud. Kriste Basque, MD

## 2010-12-26 NOTE — Op Note (Signed)
NAMEKEYLIN, [name]               ACCOUNT NO.:  [id number]   MEDICAL RECORD NO.:  [id number]          PATIENT TYPE:  AMB   LOCATION:  DSC                          FACILITY:  [facility]   PHYSICIAN:  Artist Pais. Weingold, M.D.DATE OF BIRTH:  October 17, 1923   DATE OF PROCEDURE:  DATE OF DISCHARGE:                               OPERATIVE REPORT   PREOPERATIVE DIAGNOSIS:  Right scaphoid fracture.   POSTOPERATIVE DIAGNOSIS:  Right scaphoid fracture.   PROCEDURE:  Open reduction internal fixation using 18-mm Mini Acutrak  screws.   SURGEON:  Artist Pais. Mina Marble, M.D.   ASSISTANT:  None.   ANESTHESIA:  Axillary block.   TOURNIQUET TIME:  20 minutes.   COMPLICATIONS:  None.   DRAINS:  None.   OPERATIVE REPORT:  The patient was taken to the operating suite.  After  induction of adequate axillary block analgesia, right upper extremity  was prepped and draped in usual sterile fashion.  An Esmarch was used to  exsanguinate the limb.  Tourniquet was then inflated to 250 mmHg.  At  this point in time under fluoroscopic guidance, the Mini Acutrak  guidewire was driven from proximal to distal across the fracture site  with the wrist flexed and pronated.  Intraoperative fluoroscopy revealed  good position of the guidewire in AP, lateral, and oblique views.  A  small incision was made over the dorsal aspect of the wrist.  The skin  was incised.  Dissection was carried down the proximal pole.  Proximal  pole was reamed and an 80-mm Mini Acutrak screw was placed across the  fracture site.  The guidewire was removed.  The wound was irrigated and  nicely closed with a 4-0 Vicryl Rapide suture.  Fluffs 4x4 and a radial  gutter splint was applied.  The patient tolerated the procedure well.      Artist Pais Mina Marble, M.D.  Electronically Signed     MAW/MEDQ  D:  01/14/2008  T:  01/15/2008  Job:  [id number]

## 2010-12-26 NOTE — Op Note (Signed)
NAMESHAMECKA, HOCUTT               ACCOUNT NO.:  [id number]   MEDICAL RECORD NO.:  [id number]          PATIENT TYPE:  AMB   LOCATION:  ENDO                         [facility]ACILITY:  [facility]   PHYSICIAN:  Petra Kuba, M.D.    DATE OF BIRTH:  1924/03/21   DATE OF PROCEDURE:  07/15/2007  DATE OF DISCHARGE:                               OPERATIVE REPORT   PROCEDURE:  EGD with Savary dilatation.   INDICATIONS:  Patient with dysphagia, history of rings in the past  helped by dilation in the past.  Consent was signed after risks,  benefits, methods, options thoroughly discussed multiple times in the  past.   MEDICINES USED:  Fentanyl 50 mcg, Versed 4 mg.   PROCEDURE:  The video endoscope was inserted by direct vision.  There  was no proximal ring or web.  The proximal mid-esophagus was normal and  in the distal esophagus there was a small hiatal hernia.  Possibly she  had some very short segment Barrett's, but no other esophageal  abnormalities.  Scope was inserted into the stomach and multiple tiny  hyperplastic-appearing proximal polyps were seen.  The scope was  advanced through a normal antrum, normal pylorus into a normal duodenal  bulb, and around the C-loop to a normal second portion of the duodenum.  The scope was withdrawn back to the bulb and a good look there ruled out  abnormalities or complication.  The scope was withdrawn back to the  stomach and retroflexed high in the cardia.  The hiatal hernia was  confirmed.  The angularis, cardia, fundus, lesser and greater curve were  all normal on retroflex visualization except for the multiple tiny  polyps mentioned above.  Straight visualization of the stomach did not  reveal any additional findings.  The scope was then slowly withdrawn  back to the upper esophageal sphincter confirming above esophageal  findings.  The scope was re-advanced to the antrum and under fluoro  guidance the Savary wire was advanced.  The customary J loop was  confirmed both endoscopically and under fluoro, making sure to keep the  wire in the proper position under fluoro.  The scope was removed and we  went ahead and proceeded with the Savary 16 mm dilator only, it was  advanced without any resistance.  Once it was confirmed in the stomach,  the wire was withdrawn back into the dilator, both were removed in  tandem.  There was no heme on the dilator.  We elected at this juncture  to go ahead and reinsert the scope into the stomach.  There were no  signs of esophageal trauma.  We elected to go ahead and take a few  biopsies of the gastric polyps and put them in the first container, and  then a few biopsies of the questionable short segment Barrett's, and put  them and a second container.  Air was suctioned, the scope removed.  The  patient tolerated the procedure well.  There was no obvious immediate  complication.   ENDOSCOPIC DIAGNOSES:  1. Small hiatal hernia.  No obvious ring or stricture, questionable  short segment Barrett's biopsied at the end of the procedure.  2. Multiple proximal hyperplastic-appearing tiny polyps, a few cold      biopsied at the end of the procedure.  3. Otherwise normal esophagogastroduodenoscopy.   THERAPY:  Savary dilatation to 16 only under fluoro.   PLAN:  Await pathology, see how the dilation works, follow up p.r.n. or  in 2 months to recheck symptoms and make sure no further workup plans  are needed.  Call me sooner p.r.n.  Consider barium tablet if  swallowing problems continue.           ______________________________  Petra Kuba, M.D.     MEM/MEDQ  D:  07/15/2007  T:  07/15/2007  Job:  [id number]   cc:   Lonzo Cloud. Kriste Basque, MD  [address]  [place]  [place] [address]

## 2010-12-29 NOTE — Op Note (Signed)
NAMESHANEICE, Margaret [name]               ACCOUNT NO.:  [id number]   MEDICAL RECORD NO.:  [id number]          PATIENT TYPE:  AMB   LOCATION:  ENDO                         FACILITY:  [facility]   PHYSICIAN:  Petra Kuba, M.D.    DATE OF BIRTH:  12-Apr-1924   DATE OF PROCEDURE:  09/18/2004  DATE OF DISCHARGE:                                 OPERATIVE REPORT   PROCEDURE:  Colonoscopy with biopsy.   INDICATIONS:  A patient with history of colon polyps, due for repeat  screening.  Consent was signed after risks, benefits, methods and options  were thoroughly discussed in the office multiple times in the past.   MEDICATIONS USED:  1.  Demerol 60 mg .  2.  Versed 6 mg.   DESCRIPTION OF PROCEDURE:  Rectal inspection is pertinent for external  hemorrhoids.  Digital examination was negative.  The video pediatric  adjustable colonoscope was inserted; with some difficulty due to a tortuous  long, looping colon, requiring rolling on her back and various abdominal  pressures, we were able to advance to the cecum.  No obvious abnormality was  seen on insertion.  The cecum was identified by the appendiceal orifice and  the ileocecal valve.  No obvious abnormality was seen on insertion.  The  scope was slowly withdrawn.   She did have a tortuous, long and looping colon, and we felt back around  some loops and did try to readvance around the curves; but, had some  difficulty around some of the loops.  Elected to withdraw.  Other than three  tiny left-sided questionable polyps, which were cold biopsied times one or  two, no other abnormalities were seen as we slowly withdrew back to the  rectum.   Anorectal pullthrough in retroflexion confirmed some small hemorrhoids.  The  scope was straightened and readvanced a short way up the left side of the  colon.  Air was suctioned and scope removed.   The patient tolerated the procedure well.  There was no obvious immediate  complication.   ENDOSCOPIC  DIAGNOSES:  1.  Internal/external hemorrhoids.  2.  Tortuous long, looping colon.  3.  Three tiny sigmoid polyps, cold biopsied.  4.  Otherwise within normal limits to the cecum.   PLAN:  Await pathology for future colonic screening and __________.  Might  consider a one-time virtual colonoscopy if widely available and insurance is  covering, at some point in the future.  Otherwise, will continue workup with  an EGD with Savary dilatation.      MEM/MEDQ  D:  09/18/2004  T:  09/18/2004  Job:  [id number]   cc:   Lonzo Cloud. Kriste Basque, M.D. [facility]

## 2010-12-29 NOTE — Discharge Summary (Signed)
[facility]  Patient:    Margaret [name], Margaret [name] Visit Number: [id number] MRN: [id number]          Service Type: MED Location: [phone] Attending Physician:  Verdene Lennert Dictated by:   Lonzo Cloud. Kriste Basque, M.D. [facility] Admit Date:  10/02/2001 Discharge Date: 10/04/2001                             Discharge Summary  DATE OF BIRTH:  [birth year]  FINAL DIAGNOSES:  1. Severe constipation with fecal impaction.  2. History of colon polyps and hemorrhoids.  3. Known hiatus hernia, gastroesophageal reflux disease, and previous     stricture, dilated August 2001.  4. Hypertension, controlled on medications.  5. Arteriosclerotic peripheral vascular disease with previous MRI brain     showing advanced small vessel disease, small lacunar infarcts, and mild     atrophy.  6. History of chronic sinusitis and chronic nasal polyp disease.  Previous     evaluation and treatment from Dr. Annalee Genta.  7. Degenerative arthritis with cervical spine disease, spondylosis, and     previous herniated disk at C6-7, with surgery, Dr. Tressie Stalker in     1998.  8. Osteopenia.  9. History of urinary tract infections, cystocele, and evaluation by Dr. Darvin Neighbours in the past. 10. History of urticaria and angioedema of uncertain etiology. 11. Status post tonsillectomy, adenoidectomy, hysterectomy, and bilateral     salpingo-oophorectomy.  HISTORY OF PRESENT ILLNESS:  The patient is a 77 year old female, known to me with multiple medical problems.  She was admitted on 10/02/01, with a fecal impaction.  She had a vague history of two days of constipation with a normal bowel movement two days prior to admission.  She felt nauseated, vomited once on the day prior to admission, and went to the emergency room where a KUB showed a fecal impaction.  She was partially disimpacted in the ER, given enema, and sent home with laxatives overnight.  She presented to the office on 10/02/01, with  continued constipation, abdominal distention, and discomfort. She had not had any further bowel movements, but was leaking some water around the impaction.  Rectal examination showed hard stool in the rectal vault of large volume, about half of that was removed digitally in the office, but the procedure was painful, and the patient could not go on.  Therefore, she was admitted for disimpaction and further evaluation and treatment of constipation.  The patient has a history of similar problem, and is known to Dr. Vida Rigger of the GI service.  She was hospitalized in 1999, for a fecal impaction and constipation that occurred while taking Vicodin when healing from neck surgery.  Her last colonoscopy in June 2000, showed a few tiny polyps and hemorrhoids.  She has a history of small hiatus hernia, gastroesophageal reflux disease, and stricture, which was dilated in August 2001.  PAST MEDICAL HISTORY:  She has a history of hypertension, controlled on Atacand and Norvasc.  She has a history of arteriosclerotic peripheral vascular disease with an MRI of the brain that showed advanced small vessel disease changes, small lacunar infarcts, and mild atrophy (this was done while working up syncope).  She had normal carotid Dopplers at that time.  She has a history of severe chronic sinus disease, and chronic nasal polyp disease, evaluated and treated by Dr. Annalee Genta in the past.  She had an erosion of her  nasal septum, and a previous button placed.  She has had a history of recurrent epistaxis.  She has a history of degenerative arthritis with previous cervical spine disease, and spondylosis with her herniated disk at C6-7 on the left.  She had surgery on this in 1998, by Dr. Tressie Stalker. She has known mild osteopenia on lumbar spine films, and DEXA scan performed by her gynecologist.  She has a history of urinary tract infection, cystocele, and previous evaluation by Dr. Darvin Neighbours in the past.   She has a history of urticaria and angioedema of uncertain etiology.  She has had previous tonsillectomy, adenoidectomy, hysterectomy, and bilateral salpingo-oophorectomy.  PHYSICAL EXAMINATION:  Overweight 77 year old white female in mild distress from fecal impaction.  Blood pressure 130/90, pulse 96 per minute and regular, respirations 20 per minute and not labored, temperature 98 degrees.  O2 saturation was 94% on room air.  HEENT exam reveals slight nasal erythema, and a nasal septal perforation as noted.  There is some congestion of the mucosa. Neck exam showed no jugular venous distension, no carotid bruits, no thyromegaly or lymphadenopathy.  Chest exam was clear to auscultation and percussion.  Cardiac exam revealed a regular rhythm, grade 1/6 systolic ejection murmur in the left sternal border, no rubs or gallops heard.  The abdomen was soft with intact bowel sounds, somewhat tender on deep palpation, but not too distended, and no evidence of organomegaly or masses.  Rectal showed a hard stool in the rectal vault with a fecal impaction which was partially removed digitally as noted.  The stool was brown, and hematest negative.  Extremities showed some venous insufficiency, trace edema, no clubbing, cyanosis, or edema.  Neurological exam was intact.  LABORATORY DATA:  EKG showed normal sinus rhythm with first degree AV block, and some nonspecific ST-T wave changes, no acute abnormalities suspected. Three way abdomen showed lungs were clear, and no acute changes.  There was some mild chronic increased interstitial markings noted.  Heart size was normal.  Some post-surgical changes noted in the cervical region.  Abdominal films showed no evidence of any free intraperitoneal air, minimal prominent loops of small bowel, and feces noted throughout the colon, especially on the left side.  Subsequent films showed decrease in the fecal pattern.  Hemoglobin 12.8, hematocrit 36.5, white  count 13,200, 83% segs.  Sedimentation rate 50.  Prothrombin time 13.8, INR 1.1, PTT 29 seconds.  Sodium 134,  potassium 3.6, chloride 97, CO2 26, BUN 13, creatinine 1.0, blood sugar 114, calcium 9.9.  Total protein 8.4, albumin 4.6, AST 31, ALT 21, alkaline phosphatase 63, total bilirubin 0.6.  TSH 4.6.  Urinalysis clear, except for a trace of protein present.  Urine culture was no growth.  HOSPITAL COURSE:  The patient was admitted with painful fecal impaction, and was requested for mineral oil enema and manual disimpaction, which was done on the ward.  She then received a bottle of magnesium citrate followed by lots of water, Dulcolax tablets, and suppository the next morning.  She had a good bowel movement with elimination of the impaction.  Followup films showed decrease in the stool pattern.  The decision was made to start the patient on Miralax 17 g daily, and she was instructed on a regimen to use for constipation.  DISCHARGE MEDICATIONS: 1. The patient was instructed to continue her home medications, including    Atacand 32 mg p.o. q.d., Norvasc 10 mg p.o. q.d., Pepcid 40 mg p.o. q.d. 2. In addition, she was started  on Miralax 17 g one capsule daily in cold    water, and given a p.r.n. regimen to use if she did not have a bowel    movement in a 24 to 36 hour period.    a. One bottle of magnesium citrate followed by lots of water.    b. Four Dulcolax tablets at bedtime.    c. Glycerin or Dulcolax suppository in the morning.    d. Enemas as needed.  FOLLOWUP:  The patient will call the office for a follow-up appointment in two weeks.  CONDITION ON DISCHARGE:  Improved. Dictated by:   Lonzo Cloud. Kriste Basque, M.D. [facility] Attending Physician:  Verdene Lennert DD:  10/04/01 TD:  10/06/01 Job: [id number]

## 2010-12-29 NOTE — Op Note (Signed)
Margaret [name], Margaret [name]               ACCOUNT NO.:  [id number]   MEDICAL RECORD NO.:  [id number]          PATIENT TYPE:  AMB   LOCATION:  ENDO                         FACILITY:  [facility]   PHYSICIAN:  Petra Kuba, M.D.    DATE OF BIRTH:  February 01, 1924   DATE OF PROCEDURE:  09/18/2004  DATE OF DISCHARGE:                                 OPERATIVE REPORT   PROCEDURE:  Esophagogastroduodenoscopy with Savory dilatation.   INDICATIONS:  Dysphagia.   Consent was signed after risks, benefits, methods and options were  thoroughly discussed multiple times in the past.   ADDITIONAL MEDICINES FOR THIS PROCEDURE:  Demerol 10, Versed 1.   PROCEDURE:  The video endoscope was inserted by direct vision. The proximal  esophagus was normal however, in the distal to mid esophagus, there were  linear ulcerations and erosions.  No obvious ring or stricture was seen.  The scope passed up the stomach.  She had some difficulty holding the air.  Lots of hyperplastic-appearing polyps were seen.  The scope was inserted  through a normal antrum, normal pylorus, into a normal duodenal bulb and  ___________anal verge into the duodenum.  The scope was withdrawn back to  the bulb where __________.  The scope was withdrawn back to the stomach,  where it was evaluated on straight and retroflexed visualization.  The high  end of the cardia and the ____________ was confirmed.  We did have some  difficulty completely evaluating the ____________.  From there, no other  abnormalities at the present time were seen.  We slowly withdrew back to  about 18 cm, which confirmed the above esophageal finding.  The scope was  readvanced to the antrum.  The Savary wire was advanced.  The wire was  confirmed by fluoroscopy endoscopically.  The scope was removed, making sure  to keep the wire in the proper location.  In succession, the Savory 12.8, 14  and 16 mm dilators were all advanced into the distal stomach.  Confirmed  under  fluoroscopy.  There was no heme or resistance in passing the dilators.  Once the 16 was confirmed in the stomach, the wire was withdrawn back over  the dilator with both ___________, The procedure was terminated at that  junction.  The patient tolerated the procedure well.  There was no obvious  immediate complication.   ENDOSCOPIC DIAGNOSES:  1.  Small hiatal hernia.  2.  Esophageal linear ulcers and erosions.  3.  Difficult holding air, making a difficult evaluation.  4.  Multiple tiny hypoplastic-appearing polyps  ________________.   PLAN:  Proton pump inhibitors ______________.  Follow up in 6 weeks, call  sooner p.r.n.  _____________.    MEM/MEDQ  D:  09/18/2004  T:  09/18/2004  Job:  [id number]   cc:   Lonzo Cloud. Kriste Basque, M.D. [facility]

## 2010-12-29 NOTE — Op Note (Signed)
[place]  Patient:    Margaret [name], Margaret [name]                      MRN: [id number] Proc. Date: 03/27/00 Adm. Date:  [id number] Attending:  Nelda Marseille CC:         Lonzo Cloud. Kriste Basque, M.D. [facility]   Operative Report  PROCEDURE PERFORMED:  Esophagogastroduodenoscopy with Savary dilatation.  ENDOSCOPIST:  Petra Kuba, M.D.  INDICATIONS:  The patient with long-standing dysphagia helped by last dilatation requesting repeat attempt to see if it is helpful.  CONSENT:  Consent was signed after risks, benefits, methods and options were thoroughly discussed on multiple occasions.  MEDICATIONS:  Demerol 50 mg and Versed 4 mg.  DESCRIPTION OF PROCEDURE:  The video endoscope was inserted by direct vision. We did not get a great look at the posterior pharynx.  The proximal mid esophagus was normal.  The distal esophagus with some minimal reflux changes and a small hiatal hernia.  The scope was inserted into the stomach where the polyp seen before were tiny, all hyperplastic appearing.  They were not biopsied at this time.  Some were biopsied last time without worrisome pathology.  The scope was inserted through a normal antrum, normal pylorus into a normal duodenal bulb and around the C-loop to a normal second portion of the duodenum.  The scope was withdrawn back to the bulb which was normal. The scope was withdrawn back to the stomach and retroflexed.  The hiatal hernia, cardia and fundus were normal.  Along the midstomach, possibly an extrinsic compressed area which was smooth was seen, but it was small possibly it was not significant since she did not hold the air well.  It was difficult to totally inflate the stomach.  The stomach was evaluated on straight visualization without additional findings.  The scope was then slowly withdrawn back 20 cm.  No obvious esophageal abnormality other than mentioned above was seen.  The scope was reinserted to the  antrum.  The Savary wire was advanced and the customary loop of the wire was confirmed under fluoroscopy. The scope was removed making sure under fluoroscopy guidance to keep the wire in the proper location and then in succession, the Savary 14 and 16 dilators were advanced into the stomach and confirmed under fluoroscopy.  There was no heme or resistance in passing the dilators.  After the 16 was advanced in the stomach, the wire was withdrawn back into the dilator and the dilator removed with the wire.  The patient tolerated the procedure well.  There was no obvious immediate complication.  ENDOSCOPIC DIAGNOSES: 1. Questionable proximal web not well seen. 2. Tiny to small hiatal hernia with minimal distal reflux changes. 3. Multiple hypoplastic polyps noted and biopsied. 4. Questionable small, smooth extrinsic compression in the midbody    of the stomach. 5. Otherwise within normal limits esophagogastroduodenoscopy.  THERAPY:  Savary dilatation to 16 mm under fluoroscopy with heme or resistance.  PLAN:  Follow up p.r.n. or in two months to recheck symptoms, in the meantime continue Prevacid after his request about any other abdominal symptoms or significant weight loss which he has not had to date.  Would go ahead and proceed with a one time abdominal computed tomography scan, but I doubt the extrinsic compression seen on the stomach is significant. DD:  03/27/00 TD:  03/28/00 Job: [id number]

## 2011-02-28 ENCOUNTER — Other Ambulatory Visit: Payer: Self-pay | Admitting: Pulmonary Disease

## 2011-03-05 ENCOUNTER — Other Ambulatory Visit: Payer: Self-pay | Admitting: Pulmonary Disease

## 2011-03-06 ENCOUNTER — Other Ambulatory Visit: Payer: Self-pay | Admitting: *Deleted

## 2011-03-08 ENCOUNTER — Other Ambulatory Visit (INDEPENDENT_AMBULATORY_CARE_PROVIDER_SITE_OTHER): Payer: Medicare Other

## 2011-03-08 ENCOUNTER — Encounter: Payer: Self-pay | Admitting: Pulmonary Disease

## 2011-03-08 ENCOUNTER — Ambulatory Visit (INDEPENDENT_AMBULATORY_CARE_PROVIDER_SITE_OTHER): Payer: Medicare Other | Admitting: Pulmonary Disease

## 2011-03-08 DIAGNOSIS — K219 Gastro-esophageal reflux disease without esophagitis: Secondary | ICD-10-CM

## 2011-03-08 DIAGNOSIS — E079 Disorder of thyroid, unspecified: Secondary | ICD-10-CM

## 2011-03-08 DIAGNOSIS — I679 Cerebrovascular disease, unspecified: Secondary | ICD-10-CM

## 2011-03-08 DIAGNOSIS — I10 Essential (primary) hypertension: Secondary | ICD-10-CM

## 2011-03-08 DIAGNOSIS — F411 Generalized anxiety disorder: Secondary | ICD-10-CM

## 2011-03-08 DIAGNOSIS — M1990 Unspecified osteoarthritis, unspecified site: Secondary | ICD-10-CM

## 2011-03-08 DIAGNOSIS — K5900 Constipation, unspecified: Secondary | ICD-10-CM

## 2011-03-08 DIAGNOSIS — I872 Venous insufficiency (chronic) (peripheral): Secondary | ICD-10-CM

## 2011-03-08 DIAGNOSIS — E538 Deficiency of other specified B group vitamins: Secondary | ICD-10-CM

## 2011-03-08 DIAGNOSIS — M5030 Other cervical disc degeneration, unspecified cervical region: Secondary | ICD-10-CM

## 2011-03-08 DIAGNOSIS — D649 Anemia, unspecified: Secondary | ICD-10-CM

## 2011-03-08 DIAGNOSIS — E7800 Pure hypercholesterolemia, unspecified: Secondary | ICD-10-CM

## 2011-03-08 LAB — LIPID PANEL
Cholesterol: 163 mg/dL (ref 0–200)
HDL: 71.8 mg/dL (ref >39.00)
Triglycerides: 91 mg/dL (ref 0.0–149.0)

## 2011-03-08 LAB — IBC PANEL
Iron: 87 ug/dL (ref 42–145)
Saturation Ratios: 22.1 % (ref 20.0–50.0)
Transferrin: 281.4 mg/dL (ref 212.0–360.0)

## 2011-03-08 LAB — BASIC METABOLIC PANEL
CO2: 30 mEq/L (ref 19–32)
Calcium: 9.5 mg/dL (ref 8.4–10.5)
Creatinine, Ser: 0.5 mg/dL (ref 0.4–1.2)
Glucose, Bld: 101 mg/dL — ABNORMAL HIGH (ref 70–99)

## 2011-03-08 LAB — HEPATIC FUNCTION PANEL
Albumin: 4.7 g/dL (ref 3.5–5.2)
Total Protein: 8 g/dL (ref 6.0–8.3)

## 2011-03-08 LAB — CBC WITH DIFFERENTIAL/PLATELET
Basophils Absolute: 0 10*3/uL (ref 0.0–0.1)
Hemoglobin: 11.1 g/dL — ABNORMAL LOW (ref 12.0–15.0)
Lymphocytes Relative: 48.8 % — ABNORMAL HIGH (ref 12.0–46.0)
Monocytes Relative: 9.8 % (ref 3.0–12.0)
Neutro Abs: 1.4 10*3/uL (ref 1.4–7.7)
Neutrophils Relative %: 40.7 % — ABNORMAL LOW (ref 43.0–77.0)
RBC: 3.56 Mil/uL — ABNORMAL LOW (ref 3.87–5.11)
RDW: 14.3 % (ref 11.5–14.6)

## 2011-03-08 NOTE — Progress Notes (Signed)
Subjective:    Patient ID: Margaret [name], female    DOB: 10-12-1923, 77 y.o.   MRN: [id number]  HPI  77 y/o WF here for a follow up visit... She has Chr Sinusitis;  HBP;  ASPVD;  Chol;  GERD/ Constip w/ hx impaction;  UTIs;  DJD/ DDD;  Hx anxiety;  Hx anemia...   ~  July 17, 2010:  she was hosp 10-27 to 06/10/10 by [facility] w/ constipation & fecal impaction> her GI is DrMagod;  CT Abd showed norm GB, sm bilat renal cysts, prev hyst, rectal wall thickening c/w proctitis;  Labs OK x mild anemia w/ Hg~10, borderline hypothy;  she was disimpacted & re-started on her bowel regimen of MIRALAX & SENAKOT-S...  she is back to baseline but weak, wt down to 110# & asked to incr the supplements to Bid... chjr sinus prob w/o change;  BP controlled & stable on meds;  denies cerebral ischemic symptoms;  see prob list below>>>  ~  November 06, 2010:  53mo ROV w/ review of mult chr problems & FASTING blood work today>  Review shows elev TSH= 9.85 & we will need to start her on Synthroid 73mcg/d...    Chr sinusitis>  Hx allergies & chr sinus problems w/ polyps & septal perf prev eval by DrShoemaker;  Currently only uses Mucinex & Saline prn...    HBP>  Controlled on Amlodipine, Losartan, & KCl;  BP= 110/66, no postural changes & tol meds well;  Denies CP, palpit, SOB, dizzy, edema...    Cerebrovasc dis>  On ASA 81mg /d;  sm vessel dis on prev MRI & faint bilat CBruits w/ hx neg CDopplers in the past...    Chol>  Prev on Simva40 but stopped by the pt 8/11 due to "weak";  FLP today showed TChol 293, TG 90, HDL 81, LDL 191;  rec to start statin rx w/ Crestor20 (lower dose statin, hopefully better tol)...    GERD>  On Prevacid 30mg /d, known HH & followed by [facility] for [facility] w/ dilatation 11/11 & swallowing better...    Constip>  Hx severe constip w/ hosp for impaction 2003;  On Miralax daily 7 doing OK...    DJD>  Can't take NSAIDs well, uses Tylenol & prn Advil;  C/o some left ankle pain on & off...    Anxiety>  Not on  meds...    Anemia & B12 defic>  Takes Fe daily & B12 shots monthly;  Labs today showed Hg=12.0, MCV=90...  ~  March 08, 2011:  15mo ROV & she feels as though she is "a little bit better" but still c/o weak & thinks one of her meds is keeping her up at night (doesn't know which one);  She stopped several meds including Mucinex, Losartan, Crestor, KCl, & Fe;  We decided to recheck labs> FLP looks good today ?is this on or off the Cres20 (Pharm says last filled #30 on 11/11/10)==> keep same & bring all med bottles to f/u visit; Chems= norm x Na=128; Hg=11.1, Fe= 87, B12= 418, TSH=0.27   Problem List:  SINUSITIS, CHRONIC (ICD-473.9) - Long Hx chronic sinus problems - Eval & Rx by DrShoemaker w/ hx epistaxis, nasal septal erosion w/ prev "button";  and nasal polyps... she uses Saline, Mucinex, etc... she notes mild chr cough from the drainage- we reviewed chronic nasal regimen & rec MUCINEX DM 2Bid...  HYPERTENSION (ICD-401.9) - controlled on AMLODIPINE 5mg /d (off prev cozaar & out of KCl she says)... BP today = 120/70,  no postural changes... she denies HA, visual symptoms, CP, palpit, change in SOB, edema, etc...  ~  Baseline EKG w/ NSR, WNL;   ~  2D ECHO 2001 was normal. ~  CXR 11/07 w/ Biapical pl thickening, NAD... CXR 9/09 showed COPD w/ incr markings, NAD... f/u CXR 5/11 showed COPD, NAD...  ~  11/10: c/o weak in AM- decision to decr the Amlodipine to 5mg /d & BP has been stable on this. ~  8/11: brief hosp by [facility] w/ "presyncope" & meds adjusted but she restarted prev Rx on discharge... we decided to stop the Diovan/Hct in favor of LOSARTAN 50mg /d (?if she ever even filled it- reminded to bring all med bottles to every visit).  CEREBROVASCULAR DISEASE (ICD-437.9) - on ASA 81mg /d... Prev eval for syncope (related to postural BP changes) w/ MRI showing small vessel dz;  she has bilat CBruits w/ Carotid Dopplers in 2001 showing mild plaque, no signif ICA stenoses... ~  she denies cerebral ischemic  symptoms on the ASA 81mg /d...  VENOUS INSUFFICIENCY (ICD-459.81) - on low sodium diet + support hose as needed.  HYPERCHOLESTEROLEMIA (ICD-272.0) - prev on Simva40 but she thinks this made her weak & she stopped it;  Tried CRESTOR 20mg /d but ?if filling this med regularly or not, asked to bring all bottles to each visit... ~  FLP 12/08 showed TChol 204, TG 93, HDL 67, LDL 106... On Simva40/d. ~  FLP 9/09 showed TChol 183, TG 53, HDL 68, LDL 105... Contin same + diet. ~  FLP 8/10 showed TChol 224, TG 78, HDL 67, LDL 137... rec> take med regularly & work on diet. ~  FLP 5/11 showed TChol 187, TG 80, HDL 67, LDL 104 ~  8/11: she thinks the Simva40 makes her weak & she wants to put it on HOLD. ~  FLP off meds 3/12 showed TChol 293, TG 90, HDL 81, LDL 191... rec to restart Cres20. ~  FLP 7/12 ?on Cres20 showed TChol 163, TG 91, HDL 72, LDL 73  UNSPECIFIED DISORDER OF THYROID/ HYPOTHYROIDISM - she has a sl elevated TSH which we were following & finally started SYNTHROID 61mcg/d 3/12... ~  labs 9/09 showed TSH= 7.31 ~  labs 1/10 showed TSH= 5.40 ~  labs 8/10 showed TSH= 5.08... she does c/o being "cold" ~  labs 5/11 showed TSH= 6.11... she notes that her energy is "pretty fair" ~  labs 8/11 showed TSH= 0.73... wt= 116# & stable over the last 29mo. ~  Labs 3/12 showed TSH= 9.85... Rec> start SYNTHROID 11mcg/d. ~  Labs 7/12 on Levo75 showed TSH= 0.27... Keep same for now.  GERD (ICD-530.81) - PPI changed to generic PREVACID 30mg /d... Known HH & GERD w/ EGD from [facility] 8/01 & 2/06 showing HH, GERD, erosions, & gastric polyp...  ~  EGD 12/08 w/ savory dilatation... she is encouraged to f/u w/ DrMagod if symptoms occur... ~  f/u EGD w/ dilatation 11/11 by [facility] for dysphagia symptoms showed tort esoph, sm HH, tiny gastric polyps, Savory dil done.  CONSTIPATION (ICD-564.00) - Hx severe constipation w/ fecal impaction req hosp 2/03;  last colon = 2/06 DrMagod w/ hemorrhoids & sm polyps... she uses  MIRALAX daily... ~  [facility] 10/11 w/ constip & fecal impaction> disimpacted & started back on MIRALAX & SENAKOT-S...  UTI (ICD-599.0) - Hx chronic UTI's Rx by Urology Joneen Boers w/ Macrodantin in the past... ~  8/11: hosp by [facility] w/ UTI- sens EColi treated w/ Rocephin/ then Septra.  DEGENERATIVE JOINT DISEASE (ICD-715.90) - she  uses Tylenol/ Advil as needed... ~  5/09:  she fell and broke her right wrist- s/p ORIF 6/09 by DrWeingold  DEGENERATIVE DISC DISEASE, CERVICAL SPINE (ICD-722.4) - CSpine surg by DrJenkins 1998...  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  ANEMIA (ICD-285.9), & VITAMIN B12 DEFICIENCY (ICD-266.2) - Hx mild anemia  ~  labs 6/08 showed Hg= 10.5 ~  labs 12/08 showed Hg= 12.3, Fe= 92 ~  labs 9/09 showed Hg= 11.8, MCV=89 ~  labs 1/10 showed Hg= 12.1, MCV= 89, Fe= 83, B12= 186... started on B12 shots monthly. ~  labs 8/10 showed Hg= 12.2, MCV= 92, Fe= 87, B12= 470... continue Rx. ~  labs 5/11 showed Hg= 11.2, MCV= 88 ~  labs 8/11 in hosp showed Hg= 10-11 range. ~  labs 10/11 in hosp showed Hg= 10-11 range... asked to start Femiron one tab daily. ~  Labs 3/12 showed Hg= 12.0, MCV= 90 ~  Labs 7/12 showed Hg= 11.1, MCV= 89, Fe= 87 (22%sat)  Hx SHINGLES - right T9-10 distrib 6/10 & treated w/ Famvir, Medrol, [id number]...   Past Medical History  Diagnosis Date  . Unspecified sinusitis (chronic)   . Unspecified essential hypertension   . Cerebrovascular disease, unspecified   . Unspecified venous (peripheral) insufficiency   . Pure hypercholesterolemia   . Unspecified disorder of thyroid   . Esophageal reflux   . Unspecified constipation   . Urinary tract infection, site not specified   . Osteoarthrosis, unspecified whether generalized or localized, unspecified site   . Degeneration of cervical intervertebral disc   . Closed fracture of other bone of wrist   . Anxiety state,  unspecified   . Idiopathic urticaria   . Anemia, unspecified   . Other B-complex deficiencies   . Herpes zoster without mention of complication     Past Surgical History  Procedure Date  . Tonsillectomy   . Total abdominal hysterectomy w/ bilateral salpingoophorectomy   . Cervical fusion   . Cataract extraction   . Nasal sinus surgery     SHE DID NOT BRING MED BOTTLES TO TODAY'S VISIT: Outpatient Encounter Prescriptions as of 03/08/2011  Medication Sig Dispense Refill  . amLODipine (NORVASC) 5 MG tablet TAKE 1 TABLET BY MOUTH ONCE DAILY  30 tablet  5  . aspirin 81 MG tablet Take 81 mg by mouth daily.        . Cholecalciferol (VITAMIN D) 1000 UNITS capsule Take 1,000 Units by mouth daily.        . lansoprazole (PREVACID) 30 MG capsule Take 1 tablet by mouth two times a day       . levothyroxine (SYNTHROID) 75 MCG tablet Take 1 tablet (75 mcg total) by mouth daily.  30 tablet  11  . Multiple Vitamin (MULTIVITAMINS PO) Take by mouth.        . polyethylene glycol (MIRALAX / GLYCOLAX) packet 1 capful in water once or twice daily       . potassium chloride SA (K-DUR,KLOR-CON) 20 MEQ tablet TAKE 1 TABLET BY MOUTH EVERY DAY WITH FOOD  30 tablet  4  . sennosides-docusate sodium (SENOKOT-S) 8.6-50 MG tablet Take 1 tablet by mouth daily.        . Ferrous Fumarate (FEMIRON) 63 (20 FE) MG TABS Take 20 mg by mouth daily.        Marland Kitchen guaiFENesin (MUCINEX) 600 MG 12 hr tablet Take 1,200 mg by mouth 2 (two)  times daily.        Marland Kitchen losartan (COZAAR) 50 MG tablet Take 50 mg by mouth daily.        . rosuvastatin (CRESTOR) 20 MG tablet Take 1 tablet (20 mg total) by mouth at bedtime.  30 tablet  11    No Known Allergies   Current Medications, Allergies, Past Medical History, Past Surgical History, Family History, and Social History were reviewed in [employer] record.    Review of Systems        See HPI - all other systems neg except as noted...  The patient complains of weight  loss, decreased hearing, dyspnea on exertion, headaches, severe indigestion/heartburn, muscle weakness, and difficulty walking.  The patient denies anorexia, fever, weight gain, vision loss, hoarseness, chest pain, syncope, peripheral edema, prolonged cough, hemoptysis, abdominal pain, melena, hematochezia, hematuria, incontinence, suspicious skin lesions, transient blindness, depression, unusual weight change, abnormal bleeding, enlarged lymph nodes, and angioedema.     Objective:   Physical Exam     WD, WN, 77 y/o WF in NAD... she is chr ill appearing... wt 112# GENERAL:  Alert & oriented; pleasant & cooperative... HEENT:  [place]/AT, EOM-wnl, PERRLA, EACs- sl wax, TMs-wnl, NOSE- nasal septal perf, THROAT- sl red +drainage... NECK:  Supple w/ decrROM; no JVD; normal carotid impulses w/ 1+bruits; no thyromegaly or nodules palpated; no lymphadenopathy. CHEST:  Clear to P & A; without wheezes or rales, but scat rhonchi heard... HEART:  Regular Rhythm;  gr1/6 SEM without rubs or gallops detected... ABDOMEN:  Soft & nontender; normal bowel sounds; no organomegaly or masses palpated... EXT: without deformities, mild arthritic changes; no varicose veins/ +venous insuffic/ no edema. NEURO:  CN's intact;  no focal neuro deficits, she is weak diffusely... DERM: scarring in the right T9-10 distrib of her prev shingles eruption...   Assessment & Plan:   Chronic Sinusitis>  Long hx chr sinus problems and perforated nasal septum; rec to use Mucinex 1-2 Bid + Fluids and nasal saline mist...  HBP>  BP controlled on present regimen; pt asked to bring all med bottles to every visit...  Cerebrovasc Dis>  She denies any cerebral ischemic symptoms; no postural BP changes; continue ASA daily...  Venous Insuffic>  Reminded to elim sodium, elevate legs, wear support hose...  CHOL>  ?if she is actually taking the Crestor, didn't bring bottles, Pharm indicates it hasn't been filled in 49mo; her FLP is remarkably good  & she is asked to "continue same" & reminded to bring all bottles...  HYPOTHYROID>  On Synthroid 31mcg/d & labs are better, may need decr dose but keep same for now...  GI> GERD, Constip, etc>  rec to take PPI/ Prevacid regularly, and continue Miralax etc; she is followed by drMagod...  DJD, CSpine Disc dis, etc>  Stable on OTC Tylenol & occas Advil...  ANEMIA & B12 Defic>  She remains on Fe one tab daily & B12 shots every month.Marland KitchenMarland Kitchen

## 2011-03-08 NOTE — Patient Instructions (Signed)
Today we updated your med list & printed out a copy for you...  Today we did your follow up blood work...    Please call the [facility] in a few days for your results...    Dial [id number] & when prompted enter your patient number followed by the # symbol...    Your patient number is:  [id number]#  Try the [employer] Instant Breakfast milkshake daily betw meals to help you gain a little weight...  Call for any questions...  Let's plan another follow up visit in 4 months.Marland KitchenMarland Kitchen

## 2011-03-18 ENCOUNTER — Encounter: Payer: Self-pay | Admitting: Pulmonary Disease

## 2011-04-18 ENCOUNTER — Other Ambulatory Visit: Payer: Self-pay | Admitting: Pulmonary Disease

## 2011-05-08 LAB — BASIC METABOLIC PANEL
BUN: 7
CO2: 29
Calcium: 9.4
Creatinine, Ser: 0.59
Glucose, Bld: 114 — ABNORMAL HIGH

## 2011-05-08 LAB — DIFFERENTIAL
Basophils Absolute: 0
Basophils Relative: 0
Neutro Abs: 5.2
Neutrophils Relative %: 83 — ABNORMAL HIGH

## 2011-05-08 LAB — CBC
MCHC: 35.6
RDW: 14.4

## 2011-05-08 LAB — POCT CARDIAC MARKERS
CKMB, poc: 2.7
Myoglobin, poc: 184

## 2011-05-10 LAB — BASIC METABOLIC PANEL
BUN: 7
CO2: 31
Calcium: 9.6
Chloride: 94 — ABNORMAL LOW
Creatinine, Ser: 0.62
GFR calc Af Amer: >60
GFR calc non Af Amer: >60
Glucose, Bld: 131 — ABNORMAL HIGH
Potassium: 3.8
Sodium: 132 — ABNORMAL LOW

## 2011-05-10 LAB — POCT HEMOGLOBIN-HEMACUE: Hemoglobin: 10.7 — ABNORMAL LOW

## 2011-05-21 ENCOUNTER — Telehealth: Payer: Self-pay | Admitting: Pulmonary Disease

## 2011-05-21 NOTE — Telephone Encounter (Signed)
Called and spoke with gloria and she stated that pt was at [employer] yesterday and passed out again.   Pt was alone there and ems came to check on pt but all vs were normal.  Malachi Bonds said this has happened x 2 in the last 8 wks.  She also stated that the pt has lost weight since last ov in July with SN.  They are worried that this is related to the pts BP meds.  appt has been made with TP on Thursday morning and gloria is aware to bring in all meds for the pt.

## 2011-05-22 ENCOUNTER — Other Ambulatory Visit: Payer: Self-pay | Admitting: Pulmonary Disease

## 2011-05-24 ENCOUNTER — Other Ambulatory Visit (INDEPENDENT_AMBULATORY_CARE_PROVIDER_SITE_OTHER): Payer: Medicare Other

## 2011-05-24 ENCOUNTER — Encounter: Payer: Self-pay | Admitting: Adult Health

## 2011-05-24 ENCOUNTER — Ambulatory Visit (INDEPENDENT_AMBULATORY_CARE_PROVIDER_SITE_OTHER): Payer: Medicare Other | Admitting: Adult Health

## 2011-05-24 VITALS — BP 130/62 | HR 85 | Temp 96.6°F | Ht 66.0 in | Wt 109.0 lb

## 2011-05-24 DIAGNOSIS — R55 Syncope and collapse: Secondary | ICD-10-CM

## 2011-05-24 DIAGNOSIS — E079 Disorder of thyroid, unspecified: Secondary | ICD-10-CM

## 2011-05-24 DIAGNOSIS — I10 Essential (primary) hypertension: Secondary | ICD-10-CM

## 2011-05-24 LAB — CBC WITH DIFFERENTIAL/PLATELET
Basophils Absolute: 0 10*3/uL (ref 0.0–0.1)
Eosinophils Absolute: 0 10*3/uL (ref 0.0–0.7)
Hemoglobin: 10.9 g/dL — ABNORMAL LOW (ref 12.0–15.0)
Lymphocytes Relative: 43.3 % (ref 12.0–46.0)
Lymphs Abs: 1.4 10*3/uL (ref 0.7–4.0)
MCHC: 34.2 g/dL (ref 30.0–36.0)
MCV: 88.8 fl (ref 78.0–100.0)
Monocytes Absolute: 0.3 10*3/uL (ref 0.1–1.0)
Neutro Abs: 1.6 10*3/uL (ref 1.4–7.7)
RDW: 15 % — ABNORMAL HIGH (ref 11.5–14.6)

## 2011-05-24 LAB — HEPATIC FUNCTION PANEL
Albumin: 4.4 g/dL (ref 3.5–5.2)
Total Bilirubin: 0.6 mg/dL (ref 0.3–1.2)

## 2011-05-24 LAB — BASIC METABOLIC PANEL
BUN: 14 mg/dL (ref 6–23)
Calcium: 9.4 mg/dL (ref 8.4–10.5)
Chloride: 93 mEq/L — ABNORMAL LOW (ref 96–112)
Creatinine, Ser: 0.5 mg/dL (ref 0.4–1.2)
GFR: 133.63 mL/min (ref >60.00)

## 2011-05-24 LAB — TSH: TSH: 0.98 u[IU]/mL (ref 0.35–5.50)

## 2011-05-24 NOTE — Assessment & Plan Note (Addendum)
Recurrent presyncopal episodes ? Etiology  Previous hospitalizations 03/2010 with neg workup  Suspect this is related to orthostatic changes will decrease norvasc today and look to stop if possible  On return follow up in 2 weeks   Plan;  Decrease Norvasc 5mg  1/2 tab daily .  Low salt diet .  Change positions slowly  I will call with lab work  Check b/p 3 times a week at rest, keep log, call if >160 or <90  Please contact office for sooner follow up if symptoms do not improve or worsen or seek emergency care  Follow up 2 weeks and As needed  -bring b/p log with you.

## 2011-05-24 NOTE — Patient Instructions (Signed)
Decrease Norvasc 5mg  1/2 tab daily .  Low salt diet .  Change positions slowly  I will call with lab work  Check b/p 3 times a week at rest, keep log, call if >160 or <90  Please contact office for sooner follow up if symptoms do not improve or worsen or seek emergency care  Follow up 2 weeks and As needed  -bring b/p log with you.

## 2011-05-24 NOTE — Progress Notes (Signed)
Subjective:    Patient ID: Margaret [name], female    DOB: May 25, 1924, 77 y.o.   MRN: [id number]  HPI  77 y/o WF with known hx of Chr Sinusitis;  HBP;  ASPVD;  Chol;  GERD/ Constip w/ hx impaction;  UTIs;  DJD/ DDD;  Hx anxiety;  Hx anemia...   ~  July 17, 2010:  she was hosp 10-27 to 06/10/10 by [facility] w/ constipation & fecal impaction> her GI is DrMagod;  CT Abd showed norm GB, sm bilat renal cysts, prev hyst, rectal wall thickening c/w proctitis;  Labs OK x mild anemia w/ Hg~10, borderline hypothy;  she was disimpacted & re-started on her bowel regimen of MIRALAX & SENAKOT-S...  she is back to baseline but weak, wt down to 110# & asked to incr the supplements to Bid... chjr sinus prob w/o change;  BP controlled & stable on meds;  denies cerebral ischemic symptoms;  see prob list below>>>  ~  November 06, 2010:  55mo ROV w/ review of mult chr problems & FASTING blood work today>  Review shows elev TSH= 9.85 & we will need to start her on Synthroid 11mcg/d...    Chr sinusitis>  Hx allergies & chr sinus problems w/ polyps & septal perf prev eval by DrShoemaker;  Currently only uses Mucinex & Saline prn...    HBP>  Controlled on Amlodipine, Losartan, & KCl;  BP= 110/66, no postural changes & tol meds well;  Denies CP, palpit, SOB, dizzy, edema...    Cerebrovasc dis>  On ASA 81mg /d;  sm vessel dis on prev MRI & faint bilat CBruits w/ hx neg CDopplers in the past...    Chol>  Prev on Simva40 but stopped by the pt 8/11 due to "weak";  FLP today showed TChol 293, TG 90, HDL 81, LDL 191;  rec to start statin rx w/ Crestor20 (lower dose statin, hopefully better tol)...    GERD>  On Prevacid 30mg /d, known HH & followed by [facility] for GI w/ dilatation 11/11 & swallowing better...    Constip>  Hx severe constip w/ hosp for impaction 2003;  On Miralax daily 7 doing OK...    DJD>  Can't take NSAIDs well, uses Tylenol & prn Advil;  C/o some left ankle pain on & off...    Anxiety>  Not on meds...    Anemia & B12  defic>  Takes Fe daily & B12 shots monthly;  Labs today showed Hg=12.0, MCV=90...  ~  March 08, 2011:  65mo ROV & she feels as though she is "a little bit better" but still c/o weak & thinks one of her meds is keeping her up at night (doesn't know which one);  She stopped several meds including Mucinex, Losartan, Crestor, KCl, & Fe;  We decided to recheck labs> FLP looks good today ?is this on or off the Cres20 (Pharm says last filled #30 on 11/11/10)==> keep same & bring all med bottles to f/u visit; Chems= norm x Na=128; Hg=11.1, Fe= 87, B12= 418, TSH=0.27  05/24/2011 Acute OV  Pt presents for an acute office visit. With her daughter. Complains of near passing out 4 days ago while in line at [employer]. She has done this >4 times now. Was admitted 1 year ago with exact same episode with unrevealing workup was recommended to stop norvasc for supsted orthostatic changes. Says she had not ate in several hours while standing in line felt very hot, dizzy with blurry vision was helped to the  floor. EMS came with neg workup findings. Declined ER admission . No chest pain, cough, fever or edema. No visual/speech changes or arm weakness    Problem List:  SINUSITIS, CHRONIC (ICD-473.9) - Long Hx chronic sinus problems - Eval & Rx by DrShoemaker w/ hx epistaxis, nasal septal erosion w/ prev "button";  and nasal polyps... she uses Saline, Mucinex, etc... she notes mild chr cough from the drainage- we reviewed chronic nasal regimen & rec MUCINEX DM 2Bid...  HYPERTENSION (ICD-401.9) - controlled on AMLODIPINE 5mg /d (off prev cozaar & out of KCl she says)... BP today = 120/70, no postural changes... ~  Baseline EKG w/ NSR, WNL;   ~  2D ECHO 2001 was normal. ~  CXR 11/07 w/ Biapical pl thickening, NAD... CXR 9/09 showed COPD w/ incr markings, NAD... f/u CXR 5/11 showed COPD, NAD...  ~  11/10: c/o weak in AM- decision to decr the Amlodipine to 5mg /d & BP has been stable on this. ~  8/11: brief hosp by [facility] w/ "presyncope"  & meds adjusted but she restarted prev Rx on discharge... we decided to stop the Diovan/Hct in favor of LOSARTAN 50mg /d (?if she ever even filled it- reminded to bring all med bottles to every visit).  CEREBROVASCULAR DISEASE (ICD-437.9) - on ASA 81mg /d... Prev eval for syncope (related to postural BP changes) w/ MRI showing small vessel dz;  she has bilat CBruits w/ Carotid Dopplers in 2001 showing mild plaque, no signif ICA stenoses... ~  she denies cerebral ischemic symptoms on the ASA 81mg /d...  VENOUS INSUFFICIENCY (ICD-459.81) - on low sodium diet + support hose as needed.  HYPERCHOLESTEROLEMIA (ICD-272.0) - prev on Simva40 but she thinks this made her weak & she stopped it;  Tried CRESTOR 20mg /d but ?if filling this med regularly or not, asked to bring all bottles to each visit... ~  FLP 12/08 showed TChol 204, TG 93, HDL 67, LDL 106... On Simva40/d. ~  FLP 9/09 showed TChol 183, TG 53, HDL 68, LDL 105... Contin same + diet. ~  FLP 8/10 showed TChol 224, TG 78, HDL 67, LDL 137... rec> take med regularly & work on diet. ~  FLP 5/11 showed TChol 187, TG 80, HDL 67, LDL 104 ~  8/11: she thinks the Simva40 makes her weak & she wants to put it on HOLD. ~  FLP off meds 3/12 showed TChol 293, TG 90, HDL 81, LDL 191... rec to restart Cres20. ~  FLP 7/12 ?on Cres20 showed TChol 163, TG 91, HDL 72, LDL 73  UNSPECIFIED DISORDER OF THYROID/ HYPOTHYROIDISM - she has a sl elevated TSH which we were following & finally started SYNTHROID 82mcg/d 3/12... ~  labs 9/09 showed TSH= 7.31 ~  labs 1/10 showed TSH= 5.40 ~  labs 8/10 showed TSH= 5.08... she does c/o being "cold" ~  labs 5/11 showed TSH= 6.11... she notes that her energy is "pretty fair" ~  labs 8/11 showed TSH= 0.73... wt= 116# & stable over the last 33mo. ~  Labs 3/12 showed TSH= 9.85... Rec> start SYNTHROID 17mcg/d. ~  Labs 7/12 on Levo75 showed TSH= 0.27... Keep same for now.  GERD (ICD-530.81) - PPI changed to generic PREVACID 30mg /d...  Known HH & GERD w/ EGD from [facility] 8/01 & 2/06 showing HH, GERD, erosions, & gastric polyp...  ~  EGD 12/08 w/ savory dilatation... she is encouraged to f/u w/ DrMagod if symptoms occur... ~  f/u EGD w/ dilatation 11/11 by [facility] for dysphagia symptoms showed tort esoph, sm HH,  tiny gastric polyps, Savory dil done.  CONSTIPATION (ICD-564.00) - Hx severe constipation w/ fecal impaction req hosp 2/03;  last colon = 2/06 DrMagod w/ hemorrhoids & sm polyps... she uses MIRALAX daily... ~  [facility] 10/11 w/ constip & fecal impaction> disimpacted & started back on MIRALAX & SENAKOT-S...  UTI (ICD-599.0) - Hx chronic UTI's Rx by [facility] w/ Macrodantin in the past... ~  8/11: hosp by [facility] w/ UTI- sens EColi treated w/ Rocephin/ then Septra.  DEGENERATIVE JOINT DISEASE (ICD-715.90) - she uses Tylenol/ Advil as needed... ~  5/09:  she fell and broke her right wrist- s/p ORIF 6/09 by DrWeingold  DEGENERATIVE DISC DISEASE, CERVICAL SPINE (ICD-722.4) - CSpine surg by DrJenkins 1998...  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  ANEMIA (ICD-285.9), & VITAMIN B12 DEFICIENCY (ICD-266.2) - Hx mild anemia  ~  labs 6/08 showed Hg= 10.5 ~  labs 12/08 showed Hg= 12.3, Fe= 92 ~  labs 9/09 showed Hg= 11.8, MCV=89 ~  labs 1/10 showed Hg= 12.1, MCV= 89, Fe= 83, B12= 186... started on B12 shots monthly. ~  labs 8/10 showed Hg= 12.2, MCV= 92, Fe= 87, B12= 470... continue Rx. ~  labs 5/11 showed Hg= 11.2, MCV= 88 ~  labs 8/11 in hosp showed Hg= 10-11 range. ~  labs 10/11 in hosp showed Hg= 10-11 range... asked to start Femiron one tab daily. ~  Labs 3/12 showed Hg= 12.0, MCV= 90 ~  Labs 7/12 showed Hg= 11.1, MCV= 89, Fe= 87 (22%sat)  Hx SHINGLES - right T9-10 distrib 6/10 & treated w/ Famvir, Medrol, [id number]...   Past Medical History  Diagnosis Date  . Unspecified sinusitis (chronic)   . Unspecified essential  hypertension   . Cerebrovascular disease, unspecified   . Unspecified venous (peripheral) insufficiency   . Pure hypercholesterolemia   . Unspecified disorder of thyroid   . Esophageal reflux   . Unspecified constipation   . Urinary tract infection, site not specified   . Osteoarthrosis, unspecified whether generalized or localized, unspecified site   . Degeneration of cervical intervertebral disc   . Closed fracture of other bone of wrist   . Anxiety state, unspecified   . Idiopathic urticaria   . Anemia, unspecified   . Other B-complex deficiencies   . Herpes zoster without mention of complication     Past Surgical History  Procedure Date  . Tonsillectomy   . Total abdominal hysterectomy w/ bilateral salpingoophorectomy   . Cervical fusion   . Cataract extraction   . Nasal sinus surgery     SHE DID NOT BRING MED BOTTLES TO TODAY'S VISIT: Outpatient Encounter Prescriptions as of 05/24/2011  Medication Sig Dispense Refill  . amLODipine (NORVASC) 5 MG tablet TAKE 1 TABLET BY MOUTH ONCE DAILY  30 tablet  5  . aspirin 81 MG tablet Take 81 mg by mouth daily.        Marland Kitchen b complex vitamins tablet Take 1 tablet by mouth daily.        . Cholecalciferol (VITAMIN D) 1000 UNITS capsule Take 1,000 Units by mouth daily.        Marland Kitchen guaiFENesin (MUCINEX) 600 MG 12 hr tablet Take 1,200 mg by mouth 2 (two) times daily.        . lansoprazole (PREVACID) 30 MG capsule Take 1 tablet by mouth two times a day       . levothyroxine (SYNTHROID) 75 MCG tablet  Take 1 tablet (75 mcg total) by mouth daily.  30 tablet  11  . losartan (COZAAR) 50 MG tablet TAKE 1 TABLET BY MOUTH DAILY  30 tablet  0  . Multiple Vitamin (MULTIVITAMINS PO) Take by mouth.        . polyethylene glycol (MIRALAX / GLYCOLAX) packet 1 capful in water once or twice daily       . potassium chloride SA (K-DUR,KLOR-CON) 20 MEQ tablet TAKE 1 TABLET BY MOUTH EVERY DAY WITH FOOD  30 tablet  4  . DISCONTD: Ferrous Fumarate (FEMIRON) 63 (20 FE)  MG TABS Take 20 mg by mouth daily.        Marland Kitchen DISCONTD: rosuvastatin (CRESTOR) 20 MG tablet Take 1 tablet (20 mg total) by mouth at bedtime.  30 tablet  11    No Known Allergies   Current Medications, Allergies, Past Medical History, Past Surgical History, Family History, and Social History were reviewed in [employer] record.    Review of Systems        See HPI - all other systems neg except as noted...  The patient complains of weight loss, decreased hearing,   The patient denies anorexia, fever, weight gain, vision loss, hoarseness, chest pain,  peripheral edema, prolonged cough, hemoptysis, abdominal pain, melena, hematochezia, hematuria, incontinence, suspicious skin lesions, transient blindness, depression, unusual weight change, abnormal bleeding, enlarged lymph nodes, and angioedema.     Objective:   Physical Exam     WD, WN, 77 y/o WF in NAD... she is chr ill appearing... GENERAL:  Alert & oriented; pleasant & cooperative... HEENT:  [place]/AT, EOM-wnl, PERRLA, EACs- sl wax, TMs-wnl, NOSE- nasal septal perf, THROAT- sl red +drainage... NECK:  Supple w/ decrROM; no JVD; normal carotid impulses w/ 1+bruits; no thyromegaly or nodules palpated; no lymphadenopathy. CHEST:  Clear to P & A; without wheezes or rales, but scat rhonchi heard... HEART:  Regular Rhythm;  gr1/6 SEM without rubs or gallops detected... ABDOMEN:  Soft & nontender; normal bowel sounds; no organomegaly or masses palpated... EXT: without deformities, mild arthritic changes; no varicose veins/ +venous insuffic/ no edema. NEURO:  CN's intact;  no focal neuro deficits, she is weak diffusely...  EKG w/ NSR no acute changes , nonspecific st changes .   Assessment & Plan:

## 2011-05-24 NOTE — Assessment & Plan Note (Addendum)
Recurrent presyncopal episodes ? Etiology  Previous hospitalizations 03/2010 with neg workup  Suspect this is related to orthostatic changes will decrease norvasc today and look to stop if possible  On return follow up in 2 weeks  EKG today with no acute changes  Will check labs today  Will refer to cardiology as she has had several episodes of presyncope w/ no previous card w/up   Plan;  Decrease Norvasc 5mg  1/2 tab daily .  Low salt diet .  Change positions slowly  I will call with lab work  Check b/p 3 times a week at rest, keep log, call if >160 or <90  Please contact office for sooner follow up if symptoms do not improve or worsen or seek emergency care  Follow up 2 weeks and As needed  -bring b/p log with you.

## 2011-05-27 NOTE — Progress Notes (Signed)
HPI 77 year old with a history of HTN as well as orthostatic intolerance.  She has been admitted for orthostatic symptoms in the past. She was recently seen by T Parrett.  She had a spell at [employer] with near syncope.  She  Was waiting in line and became warm, nauseated Nearly passed out. She has had more than 4 other spells. The daughter says she does not like calling attention to herself.  She does not ask for help.  Still driving.  Likes to walk around [place]. Denies SOB. She is not drinking as much as she should per the daughter because she thinks it will make her IBS flare No Known Allergies  Current Outpatient Prescriptions  Medication Sig Dispense Refill  . amLODipine (NORVASC) 5 MG tablet TAKE 1 TABLET BY MOUTH ONCE DAILY  30 tablet  5  . aspirin 81 MG tablet Take 81 mg by mouth daily.        Marland Kitchen b complex vitamins tablet Take 1 tablet by mouth daily.        . Cholecalciferol (VITAMIN D) 1000 UNITS capsule Take 1,000 Units by mouth daily.        Marland Kitchen guaiFENesin (MUCINEX) 600 MG 12 hr tablet Take 1,200 mg by mouth 2 (two) times daily.        . lansoprazole (PREVACID) 30 MG capsule Take 1 tablet by mouth two times a day       . levothyroxine (SYNTHROID) 75 MCG tablet Take 1 tablet (75 mcg total) by mouth daily.  30 tablet  11  . losartan (COZAAR) 50 MG tablet TAKE 1 TABLET BY MOUTH DAILY  30 tablet  0  . Multiple Vitamin (MULTIVITAMINS PO) Take by mouth.        . polyethylene glycol (MIRALAX / GLYCOLAX) packet 1 capful in water once or twice daily       . potassium chloride SA (K-DUR,KLOR-CON) 20 MEQ tablet TAKE 1 TABLET BY MOUTH EVERY DAY WITH FOOD  30 tablet  4    Past Medical History  Diagnosis Date  . Unspecified sinusitis (chronic)   . Unspecified essential hypertension   . Cerebrovascular disease, unspecified   . Unspecified venous (peripheral) insufficiency   . Pure hypercholesterolemia   . Unspecified disorder of thyroid   . Esophageal reflux   . Unspecified constipation    . Urinary tract infection, site not specified   . Osteoarthrosis, unspecified whether generalized or localized, unspecified site   . Degeneration of cervical intervertebral disc   . Closed fracture of other bone of wrist   . Anxiety state, unspecified   . Idiopathic urticaria   . Anemia, unspecified   . Other B-complex deficiencies   . Herpes zoster without mention of complication     Past Surgical History  Procedure Date  . Tonsillectomy   . Total abdominal hysterectomy w/ bilateral salpingoophorectomy   . Cervical fusion   . Cataract extraction   . Nasal sinus surgery     Family History  Problem Relation Age of Onset  . Diabetes Father   . Heart failure Mother   . Diabetes Sister     History   Social History  . Marital Status: Widowed    Spouse Name: N/A    Number of Children: 3  . Years of Education: N/A   Occupational History  . Retired    Social History Main Topics  . Smoking status: Never Smoker   . Smokeless tobacco: Never Used  . Alcohol Use:  No  . Drug Use: No  . Sexually Active: Not on file   Other Topics Concern  . Not on file   Social History Narrative  . No narrative on file    Review of Systems:  All systems reviewed.  They are negative to the above problem except as previously stated.  Vital Signs: There were no vitals taken for this visit.  Physical Exam  Patient is in NAD  HEENT:  Normocephalic, atraumatic. EOMI, PERRLA.  Neck: JVP is normal. No thyromegaly.  R carotid bruit  Lungs: clear to auscultation. No rales no wheezes.  Heart: Regular rate and rhythm. Normal S1, S2. No S3.   No significant murmurs. PMI not displaced.  Abdomen:  Supple, nontender. Normal bowel sounds. No masses. No hepatomegaly.  Extremities:   Good distal pulses throughout. No lower extremity edema.  Musculoskeletal :moving all extremities.  Neuro:   alert and oriented x3.  CN II-XII grossly intact.   Assessment and Plan:

## 2011-05-28 ENCOUNTER — Encounter: Payer: Self-pay | Admitting: Internal Medicine

## 2011-05-28 ENCOUNTER — Ambulatory Visit (INDEPENDENT_AMBULATORY_CARE_PROVIDER_SITE_OTHER): Payer: Medicare Other | Admitting: Internal Medicine

## 2011-05-28 VITALS — BP 177/82 | HR 91 | Ht 64.0 in | Wt 111.0 lb

## 2011-05-28 DIAGNOSIS — I10 Essential (primary) hypertension: Secondary | ICD-10-CM

## 2011-05-28 DIAGNOSIS — E7800 Pure hypercholesterolemia, unspecified: Secondary | ICD-10-CM

## 2011-05-28 DIAGNOSIS — R55 Syncope and collapse: Secondary | ICD-10-CM

## 2011-05-28 MED ORDER — PINDOLOL 5 MG PO TABS: 5.0000 mg | ORAL_TABLET | Freq: Two times a day (BID) | ORAL | Status: DC

## 2011-05-28 NOTE — Patient Instructions (Addendum)
FOLLOW UP WITH TAMMY PARRETT AND DR NADEL AS SCHEDULED  START CRESTOR 5 MG TAKE 1/2 TABLET ONCE DAILY  HOLD LOSARTAN  START PINDOLOL 5 MG ONE TABLET TWICE DAILY  STOP AMLODIPINE  Your physician recommends that you return for lab work in: IN 2 WEEKS THE SAMEDAY AS APPT WITH TAMMY PARRETT

## 2011-05-28 NOTE — Assessment & Plan Note (Signed)
Would ask her to try Crestor 2.5  Add CQ10.  Follw up lipids.

## 2011-05-28 NOTE — Assessment & Plan Note (Signed)
Patinet did not take her meds today.  She was transiently orthorstatic on immed standing.  Then went back to near baseline.   I would recomm d/cing amlodipine Start Pindolol  5 bid Hold Cozaar for now.  WIll need to follow bp at home  Resume if bP is greater than 150 Will need bmet check in 10 days. I will let f/u with T Parrett and S Nadel. Avoid prolonged standing.  Ask for help.

## 2011-05-28 NOTE — Assessment & Plan Note (Signed)
See above

## 2011-06-06 ENCOUNTER — Encounter: Payer: Self-pay | Admitting: Adult Health

## 2011-06-06 ENCOUNTER — Ambulatory Visit (INDEPENDENT_AMBULATORY_CARE_PROVIDER_SITE_OTHER): Payer: Medicare Other | Admitting: Adult Health

## 2011-06-06 ENCOUNTER — Other Ambulatory Visit: Payer: Medicare Other

## 2011-06-06 ENCOUNTER — Other Ambulatory Visit (INDEPENDENT_AMBULATORY_CARE_PROVIDER_SITE_OTHER): Payer: Medicare Other

## 2011-06-06 DIAGNOSIS — D649 Anemia, unspecified: Secondary | ICD-10-CM

## 2011-06-06 DIAGNOSIS — E538 Deficiency of other specified B group vitamins: Secondary | ICD-10-CM

## 2011-06-06 DIAGNOSIS — R55 Syncope and collapse: Secondary | ICD-10-CM

## 2011-06-06 LAB — IBC PANEL
Iron: 87 ug/dL (ref 42–145)
Saturation Ratios: 23.6 % (ref 20.0–50.0)
Transferrin: 263.5 mg/dL (ref 212.0–360.0)

## 2011-06-06 LAB — VITAMIN B12: Vitamin B-12: 457 pg/mL (ref 211–911)

## 2011-06-06 NOTE — Progress Notes (Signed)
Subjective:    Patient ID: Margaret [name], female    DOB: 05-25-1924, 77 y.o.   MRN: [id number]  HPI  77 y/o WF with known hx of Chr Sinusitis;  HBP;  ASPVD;  Chol;  GERD/ Constip w/ hx impaction;  UTIs;  DJD/ DDD;  Hx anxiety;  Hx anemia...   ~  July 17, 2010:  she was hosp 10-27 to 06/10/10 by [facility] w/ constipation & fecal impaction> her GI is DrMagod;  CT Abd showed norm GB, sm bilat renal cysts, prev hyst, rectal wall thickening c/w proctitis;  Labs OK x mild anemia w/ Hg~10, borderline hypothy;  she was disimpacted & re-started on her bowel regimen of MIRALAX & SENAKOT-S...  she is back to baseline but weak, wt down to 110# & asked to incr the supplements to Bid... chjr sinus prob w/o change;  BP controlled & stable on meds;  denies cerebral ischemic symptoms;  see prob list below>>>  ~  November 06, 2010:  69mo ROV w/ review of mult chr problems & FASTING blood work today>  Review shows elev TSH= 9.85 & we will need to start her on Synthroid 57mcg/d...    Chr sinusitis>  Hx allergies & chr sinus problems w/ polyps & septal perf prev eval by DrShoemaker;  Currently only uses Mucinex & Saline prn...    HBP>  Controlled on Amlodipine, Losartan, & KCl;  BP= 110/66, no postural changes & tol meds well;  Denies CP, palpit, SOB, dizzy, edema...    Cerebrovasc dis>  On ASA 81mg /d;  sm vessel dis on prev MRI & faint bilat CBruits w/ hx neg CDopplers in the past...    Chol>  Prev on Simva40 but stopped by the pt 8/11 due to "weak";  FLP today showed TChol 293, TG 90, HDL 81, LDL 191;  rec to start statin rx w/ Crestor20 (lower dose statin, hopefully better tol)...    GERD>  On Prevacid 30mg /d, known HH & followed by [facility] for GI w/ dilatation 11/11 & swallowing better...    Constip>  Hx severe constip w/ hosp for impaction 2003;  On Miralax daily 7 doing OK...    DJD>  Can't take NSAIDs well, uses Tylenol & prn Advil;  C/o some left ankle pain on & off...    Anxiety>  Not on meds...    Anemia & B12  defic>  Takes Fe daily & B12 shots monthly;  Labs today showed Hg=12.0, MCV=90...  ~  March 08, 2011:  5mo ROV & she feels as though she is "a little bit better" but still c/o weak & thinks one of her meds is keeping her up at night (doesn't know which one);  She stopped several meds including Mucinex, Losartan, Crestor, KCl, & Fe;  We decided to recheck labs> FLP looks good today ?is this on or off the Cres20 (Pharm says last filled #30 on 11/11/10)==> keep same & bring all med bottles to f/u visit; Chems= norm x Na=128; Hg=11.1, Fe= 87, B12= 418, TSH=0.27  [id number]/112012 Acute OV  Pt presents for an acute office visit. With her daughter. Complains of near passing out 4 days ago while in line at [employer]. She has done this >4 times now. Was admitted 1 year ago with exact same episode with unrevealing workup was recommended to stop norvasc for supsted orthostatic changes. Says she had not ate in several hours while standing in line felt very hot, dizzy with blurry vision was helped to the  floor. EMS came with neg workup findings. Declined ER admission . No chest pain, cough, fever or edema. No visual/speech changes or arm weakness  >>Decreased Norvasc 5mg  1/2 daily   06/06/11 Follow up  Pt returns for follow up from last ov after episode of recurrent near syncope. It was felt this may be orthostatic in nature as she has had this several times in past with workuup in past being unrevealing.  Since last ov she was seen by cardiology and placed on pindolol by Dr. Tenny Craw. Dr. Tenny Craw instructed patient to hold her Cozaar and stop Norvasc .   patient is feeling better with no further episodes of near syncopal episodes. She has since restarted Cozaar. She is still taking Norvasc 5 mg one half tablet daily. Labs last visit showed no significant change in her sodium levels, hemoglobin with no significant change with her history of chronic anemia. TSH was therapeutic on current dose of Synthroid.     Problem  List:  SINUSITIS, CHRONIC (ICD-473.9) - Long Hx chronic sinus problems - Eval & Rx by DrShoemaker w/ hx epistaxis, nasal septal erosion w/ prev "button";  and nasal polyps... she uses Saline, Mucinex, etc... she notes mild chr cough from the drainage- we reviewed chronic nasal regimen & rec MUCINEX DM 2Bid...  HYPERTENSION (ICD-401.9) - controlled on AMLODIPINE 5mg /d (off prev cozaar & out of KCl she says)... BP today = 120/70, no postural changes... ~  Baseline EKG w/ NSR, WNL;   ~  2D ECHO 2001 was normal. ~  CXR 11/07 w/ Biapical pl thickening, NAD... CXR 9/09 showed COPD w/ incr markings, NAD... f/u CXR 5/11 showed COPD, NAD...  ~  11/10: c/o weak in AM- decision to decr the Amlodipine to 5mg /d & BP has been stable on this. ~  8/11: brief hosp by [facility] w/ "presyncope" & meds adjusted but she restarted prev Rx on discharge... we decided to stop the Diovan/Hct in favor of LOSARTAN 50mg /d (?if she ever even filled it- reminded to bring all med bottles to every visit).  CEREBROVASCULAR DISEASE (ICD-437.9) - on ASA 81mg /d... Prev eval for syncope (related to postural BP changes) w/ MRI showing small vessel dz;  she has bilat CBruits w/ Carotid Dopplers in 2001 showing mild plaque, no signif ICA stenoses... ~  she denies cerebral ischemic symptoms on the ASA 81mg /d...  VENOUS INSUFFICIENCY (ICD-459.81) - on low sodium diet + support hose as needed.  HYPERCHOLESTEROLEMIA (ICD-272.0) - prev on Simva40 but she thinks this made her weak & she stopped it;  Tried CRESTOR 20mg /d but ?if filling this med regularly or not, asked to bring all bottles to each visit... ~  FLP 12/08 showed TChol 204, TG 93, HDL 67, LDL 106... On Simva40/d. ~  FLP 9/09 showed TChol 183, TG 53, HDL 68, LDL 105... Contin same + diet. ~  FLP 8/10 showed TChol 224, TG 78, HDL 67, LDL 137... rec> take med regularly & work on diet. ~  FLP 5/11 showed TChol 187, TG 80, HDL 67, LDL 104 ~  8/11: she thinks the Simva40 makes her weak & she  wants to put it on HOLD. ~  FLP off meds 3/12 showed TChol 293, TG 90, HDL 81, LDL 191... rec to restart Cres20. ~  FLP 7/12 ?on Cres20 showed TChol 163, TG 91, HDL 72, LDL 73  UNSPECIFIED DISORDER OF THYROID/ HYPOTHYROIDISM - she has a sl elevated TSH which we were following & finally started SYNTHROID 53mcg/d 3/12... ~  labs 9/09  showed TSH= 7.31 ~  labs 1/10 showed TSH= 5.40 ~  labs 8/10 showed TSH= 5.08... she does c/o being "cold" ~  labs 5/11 showed TSH= 6.11... she notes that her energy is "pretty fair" ~  labs 8/11 showed TSH= 0.73... wt= 116# & stable over the last 26mo. ~  Labs 3/12 showed TSH= 9.85... Rec> start SYNTHROID 36mcg/d. ~  Labs 7/12 on Levo75 showed TSH= 0.27... Keep same for now.  GERD (ICD-530.81) - PPI changed to generic PREVACID 30mg /d... Known HH & GERD w/ EGD from [facility] 8/01 & 2/06 showing HH, GERD, erosions, & gastric polyp...  ~  EGD 12/08 w/ savory dilatation... she is encouraged to f/u w/ DrMagod if symptoms occur... ~  f/u EGD w/ dilatation 11/11 by [facility] for dysphagia symptoms showed tort esoph, sm HH, tiny gastric polyps, Savory dil done.  CONSTIPATION (ICD-564.00) - Hx severe constipation w/ fecal impaction req hosp 2/03;  last colon = 2/06 DrMagod w/ hemorrhoids & sm polyps... she uses MIRALAX daily... ~  [facility] 10/11 w/ constip & fecal impaction> disimpacted & started back on MIRALAX & SENAKOT-S...  UTI (ICD-599.0) - Hx chronic UTI's Rx by [facility] w/ Macrodantin in the past... ~  8/11: hosp by [facility] w/ UTI- sens EColi treated w/ Rocephin/ then Septra.  DEGENERATIVE JOINT DISEASE (ICD-715.90) - she uses Tylenol/ Advil as needed... ~  5/09:  she fell and broke her right wrist- s/p ORIF 6/09 by DrWeingold  DEGENERATIVE DISC DISEASE, CERVICAL SPINE (ICD-722.4) - CSpine surg by DrJenkins 1998...  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  ANEMIA  (ICD-285.9), & VITAMIN B12 DEFICIENCY (ICD-266.2) - Hx mild anemia  ~  labs 6/08 showed Hg= 10.5 ~  labs 12/08 showed Hg= 12.3, Fe= 92 ~  labs 9/09 showed Hg= 11.8, MCV=89 ~  labs 1/10 showed Hg= 12.1, MCV= 89, Fe= 83, B12= 186... started on B12 shots monthly. ~  labs 8/10 showed Hg= 12.2, MCV= 92, Fe= 87, B12= 470... continue Rx. ~  labs 5/11 showed Hg= 11.2, MCV= 88 ~  labs 8/11 in hosp showed Hg= 10-11 range. ~  labs 10/11 in hosp showed Hg= 10-11 range... asked to start Femiron one tab daily. ~  Labs 3/12 showed Hg= 12.0, MCV= 90 ~  Labs 7/12 showed Hg= 11.1, MCV= 89, Fe= 87 (22%sat)  Hx SHINGLES - right T9-10 distrib 6/10 & treated w/ Famvir, Medrol, [id number]...   Past Medical History  Diagnosis Date  . Unspecified sinusitis (chronic)   . Unspecified essential hypertension   . Cerebrovascular disease, unspecified   . Unspecified venous (peripheral) insufficiency   . Pure hypercholesterolemia   . Unspecified disorder of thyroid   . Esophageal reflux   . Unspecified constipation   . Urinary tract infection, site not specified   . Osteoarthrosis, unspecified whether generalized or localized, unspecified site   . Degeneration of cervical intervertebral disc   . Closed fracture of other bone of wrist   . Anxiety state, unspecified   . Idiopathic urticaria   . Anemia, unspecified   . Other B-complex deficiencies   . Herpes zoster without mention of complication     Past Surgical History  Procedure Date  . Tonsillectomy   . Total abdominal hysterectomy w/ bilateral salpingoophorectomy   . Cervical fusion   . Cataract extraction   . Nasal sinus surgery     SHE DID NOT BRING MED BOTTLES TO TODAY'S  VISIT: Outpatient Encounter Prescriptions as of 06/06/2011  Medication Sig Dispense Refill  . amLODipine (NORVASC) 5 MG tablet Take 1/2 tablet daily        . aspirin 81 MG tablet Take 81 mg by mouth daily.        Marland Kitchen b complex vitamins tablet Take 1 tablet by mouth daily.         . Cholecalciferol (VITAMIN D) 1000 UNITS capsule Take 1,000 Units by mouth daily.        Marland Kitchen guaiFENesin (MUCINEX) 600 MG 12 hr tablet Take 1,200 mg by mouth 2 (two) times daily.        . lansoprazole (PREVACID) 30 MG capsule Take 1 tablet by mouth two times a day       . levothyroxine (SYNTHROID) 75 MCG tablet Take 1 tablet (75 mcg total) by mouth daily.  30 tablet  11  . losartan (COZAAR) 50 MG tablet TAKE 1 TABLET BY MOUTH DAILY  30 tablet  0  . Multiple Vitamin (MULTIVITAMINS PO) Take by mouth.        . pindolol (VISKEN) 5 MG tablet Take 1 tablet (5 mg total) by mouth 2 (two) times daily.  60 tablet  12  . polyethylene glycol (MIRALAX / GLYCOLAX) packet 1 capful in water once or twice daily       . potassium chloride SA (K-DUR,KLOR-CON) 20 MEQ tablet TAKE 1 TABLET BY MOUTH EVERY DAY WITH FOOD  30 tablet  4  . rosuvastatin (CRESTOR) 5 MG tablet Take 1/2 tablet daily          No Known Allergies   Current Medications, Allergies, Past Medical History, Past Surgical History, Family History, and Social History were reviewed in [employer] record.    Review of Systems        Constitutional:   No  weight loss, night sweats,  Fevers, chills, fatigue, or  lassitude.  HEENT:   No headaches,  Difficulty swallowing,  Tooth/dental problems, or  Sore throat,                No sneezing, itching, ear ache, nasal congestion, post nasal drip,   CV:  No chest pain,  Orthopnea, PND, swelling in lower extremities, anasarca, dizziness, palpitations, syncope.   GI  No heartburn, indigestion, abdominal pain, nausea, vomiting, diarrhea, change in bowel habits, loss of appetite, bloody stools.   Resp: No shortness of breath with exertion or at rest.  No excess mucus, no productive cough,  No non-productive cough,  No coughing up of blood.  No change in color of mucus.  No wheezing.  No chest wall deformity  Skin: no rash or lesions.  GU: no dysuria, change in color of urine, no  urgency or frequency.  No flank pain, no hematuria   MS:  No joint pain or swelling.  No decreased range of motion.  No back pain.  Psych:  No change in mood or affect. No depression or anxiety.  No memory loss.       Objective:   Physical Exam     WD, WN, 77 y/o WF in NAD... she is chr ill appearing... GENERAL:  Alert & oriented; pleasant & cooperative... HEENT:  [place]/AT, EOM-wnl, PERRLA, EACs- sl wax, TMs-wnl, NOSE clear , THROAT- sl red +drainage... NECK:  Supple w/ decrROM; no JVD; normal carotid impulses w/ 1+bruits; no thyromegaly or nodules palpated; no lymphadenopathy. CHEST:  Clear to P & A; without wheezes or rales, but scat rhonchi heard.Marland KitchenMarland Kitchen  HEART:  Regular Rhythm;  gr1/6 SEM without rubs or gallops detected... ABDOMEN:  Soft & nontender; normal bowel sounds; no organomegaly or masses palpated... EXT: without deformities, mild arthritic changes; no varicose veins/ +venous insuffic/ no edema. NEURO:  no focal neuro deficits     Assessment & Plan:

## 2011-06-06 NOTE — Patient Instructions (Signed)
Stop Norvasc (Amlodipine) .  Continue on Losartan (Cozaar) 50mg  daily  Continue on Pindolol 5mg  Twice daily   Low salt diet .  I will call with labs.  Please contact office for sooner follow up if symptoms do not improve or worsen or seek emergency care  follow up Dr. Kriste Basque  In 1 month and As needed

## 2011-06-08 NOTE — Assessment & Plan Note (Addendum)
Recurrent presyncopal episodes suspected to be orthostatic in nature patient is improved we'll go ahead and discontinue Norvasc completely. We'll follow up of her chronic anemia with a B12 level today.  Plan:  Stop Norvasc (Amlodipine) .  Continue on Losartan (Cozaar) 50mg  daily  Continue on Pindolol 5mg  Twice daily   Low salt diet .  I will call with labs.  Please contact office for sooner follow up if symptoms do not improve or worsen or seek emergency care  follow up Dr. Kriste Basque  In 1 month and As needed

## 2011-06-26 ENCOUNTER — Other Ambulatory Visit: Payer: Self-pay | Admitting: Pulmonary Disease

## 2011-07-02 ENCOUNTER — Other Ambulatory Visit: Payer: Self-pay | Admitting: Pulmonary Disease

## 2011-07-09 ENCOUNTER — Ambulatory Visit (INDEPENDENT_AMBULATORY_CARE_PROVIDER_SITE_OTHER): Payer: Medicare Other | Admitting: Pulmonary Disease

## 2011-07-09 ENCOUNTER — Encounter: Payer: Self-pay | Admitting: Pulmonary Disease

## 2011-07-09 DIAGNOSIS — M1990 Unspecified osteoarthritis, unspecified site: Secondary | ICD-10-CM

## 2011-07-09 DIAGNOSIS — I679 Cerebrovascular disease, unspecified: Secondary | ICD-10-CM

## 2011-07-09 DIAGNOSIS — I10 Essential (primary) hypertension: Secondary | ICD-10-CM

## 2011-07-09 DIAGNOSIS — K5900 Constipation, unspecified: Secondary | ICD-10-CM

## 2011-07-09 DIAGNOSIS — D649 Anemia, unspecified: Secondary | ICD-10-CM

## 2011-07-09 DIAGNOSIS — I872 Venous insufficiency (chronic) (peripheral): Secondary | ICD-10-CM

## 2011-07-09 DIAGNOSIS — K219 Gastro-esophageal reflux disease without esophagitis: Secondary | ICD-10-CM

## 2011-07-09 DIAGNOSIS — E7800 Pure hypercholesterolemia, unspecified: Secondary | ICD-10-CM

## 2011-07-09 DIAGNOSIS — M5030 Other cervical disc degeneration, unspecified cervical region: Secondary | ICD-10-CM

## 2011-07-09 DIAGNOSIS — E079 Disorder of thyroid, unspecified: Secondary | ICD-10-CM

## 2011-07-09 DIAGNOSIS — R55 Syncope and collapse: Secondary | ICD-10-CM

## 2011-07-09 DIAGNOSIS — F411 Generalized anxiety disorder: Secondary | ICD-10-CM

## 2011-07-09 NOTE — Patient Instructions (Signed)
Today we updated your med list in our EPIC system...    We reassessed your BP/ Heart meds>       STOP the AMLODIPINE...       Take the PINDOLOL 5mg > one tab TWICE daily...       Take the LOSARTAN 50mg > one tab daily...       Continue your other meds the same...  Call for any questions...  Let's plan a follow up visit in 6-8 weeks to recheck everything...    THANK YOU for bringing your med bottles to each & every visit.Marland KitchenMarland Kitchen

## 2011-07-09 NOTE — Progress Notes (Signed)
Subjective:    Patient ID: Margaret [name], female    DOB: 12-01-1923, 77 y.o.   MRN: [id number]  HPI 77 y/o WF here for a follow up visit... She has Chr Sinusitis;  HBP;  ASPVD;  Chol;  GERD/ Constip w/ hx impaction;  UTIs;  DJD/ DDD;  Hx anxiety;  Hx anemia...   ~  July 17, 2010:  she was hosp 10/27 to 06/10/10 by [facility] w/ constipation & fecal impaction> her GI is DrMagod;  CT Abd showed norm GB, sm bilat renal cysts, prev hyst, rectal wall thickening c/w proctitis;  Labs OK x mild anemia w/ Hg~10, borderline hypothy;  she was disimpacted & re-started on her bowel regimen of MIRALAX & SENAKOT-S...  she is back to baseline but weak, wt down to 110# & asked to incr the supplements to Bid... chjr sinus prob w/o change;  BP controlled & stable on meds;  denies cerebral ischemic symptoms;  see prob list below>>>  ~  November 06, 2010:  32mo ROV w/ review of mult chr problems & FASTING blood work today>  Review shows elev TSH= 9.85 & we will need to start her on Synthroid 45mcg/d...  ~  March 08, 2011:  32mo ROV & she feels as though she is "a little bit better" but still c/o weak & thinks one of her meds is keeping her up at night (doesn't know which one);  She stopped several meds including Mucinex, Losartan, Crestor, KCl, & Fe;  We decided to recheck labs> FLP looks good today ?is this on or off the Cres20 (Pharm says last filled #30 on 11/11/10)==> keep same & bring all med bottles to f/u visit; Chems= norm x Na=128; Hg=11.1, Fe= 87, B12= 418, TSH=0.27  ~  July 09, 2011:  32mo ROV & she brought all of her med bottles to the office today to review>>    Chr sinusitis>  Hx allergies & chr sinus problems w/ polyps & septal perf prev eval by DrShoemaker;  Currently only uses Mucinex & Saline prn...    HBP & pre-syncope>  She saw DrPRoss 10/12 after a spell at [employer] w/ near syncope; she adjusted her meds by stopping Cozaar50 & Amlodipine2.5, & starting PINDOLOL5Bid; Review of meds shows that she never stopped  Losartan or Norvasc, she did fill Visken5 but only taking one/d; BP= 132/70 w/ transient postural drop ==> Rec to stop Amlodipine, continue Cozaar50, incr Pindolol5Bid...     Cerebrovasc dis>  On ASA 81mg /d;  sm vessel dis on prev MRI & faint bilat CBruits w/ hx neg CDopplers in the past...    Chol>  Prev on Simva40 but stopped by the pt 8/11 due to "weak"; we tried to get her on Crestor & so did DrRoss but pt stopped this too (?made her weak)...    GERD>  On Prevacid 30mg /d, known HH & followed by [facility] for GI w/ dilatation 11/11 & swallowing better...    Constip>  Hx severe constip w/ hosp for impaction 2003;  On Miralax daily & doing OK...    DJD>  Can't take NSAIDs well, uses Tylenol & prn Advil;  C/o some left ankle pain on & off...    Anxiety>  Not on meds...    Anemia & B12 defic>  Takes Fe daily & B12 shots monthly;  Labs today showed Hg=12.0, MCV=90...          Problem List:  SINUSITIS, CHRONIC (ICD-473.9) - Long Hx chronic sinus problems - Eval &  Rx by DrShoemaker w/ hx epistaxis, nasal septal erosion w/ prev "button";  and nasal polyps... she uses Saline, Mucinex, etc... she notes mild chr cough from the drainage- we reviewed chronic nasal regimen & rec MUCINEX DM 2Bid...  HYPERTENSION (ICD-401.9) - control hampered by freq med changes by her physicians, and her compliance w/ medication directions; She is requested to bring all med bottles to every office visit. ~  Baseline EKG w/ NSR, WNL... ~  2D ECHO 2001 was normal... ~  CXR 11/07 w/ Biapical pl thickening, NAD... CXR 9/09 showed COPD w/ incr markings, NAD... f/u CXR 5/11 showed COPD, NAD...  ~  11/10: c/o weak in AM- decision to decr Amlodipine to 5mg /d & BP has been stable on this. ~  8/11: Hosp by [facility] w/ "presyncope" & meds were adjusted but she restarted prev Rx on discharge> we decided to stop Diovan/Hct in favor of LOSARTAN 50mg /d (?if she ever even filled it- reminded to bring all med bottles to every visit). ~  7/12: BP  today= 120/70, no postural changes, ?on Amlodip5 & Losartan50... she denies HA, visual symptoms, CP, palpit, change in SOB, edema, etc... ~  10/12: Near syncope at [employer]> saw DrRoss w/ attempted meds changes (asked to stop Amlodip/Losartan & start Pindolol5Bid) but she decided to take all 3... ~  11/12: BP today= 132/70 w/ transient postural drop; asked to stop Amlodip, continue LOSARTAN50, take VISKEN5Bid...  CEREBROVASCULAR DISEASE (ICD-437.9) - on ASA 81mg /d... Prev eval for syncope (related to postural BP changes) w/ MRI showing small vessel dz;  she has bilat CBruits w/ Carotid Dopplers in 2001 showing mild plaque, no signif ICA stenoses... ~  she denies cerebral ischemic symptoms on the ASA 81mg /d...  VENOUS INSUFFICIENCY (ICD-459.81) - on low sodium diet + support hose as needed.  HYPERCHOLESTEROLEMIA (ICD-272.0) - prev on Simva40 but she thinks this made her weak & she stopped it;  Tried Crestor 20mg /d but ?if filling this med regularly or not, asked to bring all bottles to each visit... ~  FLP 12/08 showed TChol 204, TG 93, HDL 67, LDL 106... On Simva40/d. ~  FLP 9/09 showed TChol 183, TG 53, HDL 68, LDL 105... Contin same + diet. ~  FLP 8/10 showed TChol 224, TG 78, HDL 67, LDL 137... rec> take med regularly & work on diet. ~  FLP 5/11 showed TChol 187, TG 80, HDL 67, LDL 104 ~  8/11: she thinks the Simva40 makes her weak & she wants to put it on HOLD. ~  FLP off meds 3/12 showed TChol 293, TG 90, HDL 81, LDL 191... rec to restart Cres20. ~  FLP 7/12 ?on Cres20 showed TChol 163, TG 91, HDL 72, LDL 73 ~  She has since stopped the Crestor on her own (?making her weak).  UNSPECIFIED DISORDER OF THYROID/ HYPOTHYROIDISM - she has a sl elevated TSH which we were following & finally started SYNTHROID 7mcg/d 3/12... ~  labs 9/09 showed TSH= 7.31 ~  labs 1/10 showed TSH= 5.40 ~  labs 8/10 showed TSH= 5.08... she does c/o being "cold" ~  labs 5/11 showed TSH= 6.11... she notes that her energy  is "pretty fair" ~  labs 8/11 showed TSH= 0.73... wt= 116# & stable over the last 58mo. ~  Labs 3/12 showed TSH= 9.85... Rec> start SYNTHROID 56mcg/d. ~  Labs 7/12 on Levo75 showed TSH= 0.27... Keep same for now.  GERD (ICD-530.81) - PPI changed to generic PREVACID 30mg /d... Known HH & GERD w/ EGD from [facility]  8/01 & 2/06 showing HH, GERD, erosions, & gastric polyp...  ~  EGD 12/08 w/ savory dilatation... she is encouraged to f/u w/ DrMagod if symptoms occur... ~  f/u EGD w/ dilatation 11/11 by [facility] for dysphagia symptoms showed tort esoph, sm HH, tiny gastric polyps, Savory dil done.  CONSTIPATION (ICD-564.00) - Hx severe constipation w/ fecal impaction req hosp 2/03;  last colon = 2/06 DrMagod w/ hemorrhoids & sm polyps... she uses MIRALAX daily... ~  [facility] 10/11 w/ constip & fecal impaction> disimpacted & started back on MIRALAX & SENAKOT-S...  UTI (ICD-599.0) - Hx chronic UTI's Rx by U[facility] J[facility] Boers w/ Macrodantin in the past... ~  8/11: hosp by [facility] w/ UTI- sens EColi treated w/ Rocephin/ then Septra.  DEGENERATIVE JOINT DISEASE (ICD-715.90) - she uses Tylenol/ Advil as needed... ~  5/09:  she fell and broke her right wrist- s/p ORIF 6/09 by DrWeingold  DEGENERATIVE DISC DISEASE, CERVICAL SPINE (ICD-722.4) - CSpine surg by DrJenkins 1998...  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  ANEMIA (ICD-285.9), & VITAMIN B12 DEFICIENCY (ICD-266.2) - Hx mild anemia  ~  labs 6/08 showed Hg= 10.5 ~  labs 12/08 showed Hg= 12.3, Fe= 92 ~  labs 9/09 showed Hg= 11.8, MCV=89 ~  labs 1/10 showed Hg= 12.1, MCV= 89, Fe= 83, B12= 186... started on B12 shots monthly. ~  labs 8/10 showed Hg= 12.2, MCV= 92, Fe= 87, B12= 470... continue Rx. ~  labs 5/11 showed Hg= 11.2, MCV= 88 ~  labs 8/11 in hosp showed Hg= 10-11 range. ~  labs 10/11 in hosp showed Hg= 10-11 range... asked to start Femiron one tab daily. ~   Labs 3/12 showed Hg= 12.0, MCV= 90 ~  Labs 7/12 showed Hg= 11.1, MCV= 89, Fe= 87 (22%sat)  Hx SHINGLES - right T9-10 distrib 6/10 & treated w/ Famvir, Medrol, DCN100...   Past Surgical History  Procedure Date  . Tonsillectomy   . Total abdominal hysterectomy w/ bilateral salpingoophorectomy   . Cervical fusion   . Cataract extraction   . Nasal sinus surgery     Outpatient Encounter Prescriptions as of 07/09/2011  Medication Sig Dispense Refill  . amLODipine (NORVASC) 5 MG tablet Take 1/2 tablet daily    >> ASKED TO STOP THIS MED...    . aspirin 81 MG tablet Take 81 mg by mouth daily.        Marland Kitchen b complex vitamins tablet Take 1 tablet by mouth daily.        . Cholecalciferol (VITAMIN D) 1000 UNITS capsule Take 1,000 Units by mouth daily.        . lansoprazole (PREVACID) 30 MG capsule Take 1 tablet by mouth two times a day       . levothyroxine (SYNTHROID) 75 MCG tablet Take 1 tablet (75 mcg total) by mouth daily.  30 tablet  11  . losartan (COZAAR) 50 MG tablet TAKE 1 TABLET BY MOUTH DAILY  30 tablet  0  . pindolol (VISKEN) 5 MG tablet Take 1 tablet (5 mg total) by mouth 2 (two) times daily.  60 tablet  12  . polyethylene glycol (MIRALAX / GLYCOLAX) packet 1 capful in water once or twice daily       . potassium chloride SA (K-DUR,KLOR-CON) 20 MEQ tablet TAKE 1 TABLET BY MOUTH EVERY DAY WITH FOOD  30 tablet  4  . guaiFENesin (MUCINEX) 600 MG 12 hr tablet Take  1,200 mg by mouth 2 (two) times daily.        . Multiple Vitamin (MULTIVITAMINS PO) Take by mouth.        . rosuvastatin (CRESTOR) 5 MG tablet Take 1/2 tablet daily    >> SHE STOPPED on her own!!!      No Known Allergies   Current Medications, Allergies, Past Medical History, Past Surgical History, Family History, and Social History were reviewed in [employer] record.    Review of Systems        See HPI - all other systems neg except as noted...  The patient complains of weight loss, decreased  hearing, dyspnea on exertion, headaches, severe indigestion/heartburn, muscle weakness, and difficulty walking.  The patient denies anorexia, fever, weight gain, vision loss, hoarseness, chest pain, syncope, peripheral edema, prolonged cough, hemoptysis, abdominal pain, melena, hematochezia, hematuria, incontinence, suspicious skin lesions, transient blindness, depression, unusual weight change, abnormal bleeding, enlarged lymph nodes, and angioedema.     Objective:   Physical Exam     WD, WN, 77 y/o WF in NAD... she is chr ill appearing... wt 112# GENERAL:  Alert & oriented; pleasant & cooperative... HEENT:  [place]/AT, EOM-wnl, PERRLA, EACs- sl wax, TMs-wnl, NOSE- nasal septal perf, THROAT- sl red +drainage... NECK:  Supple w/ decrROM; no JVD; normal carotid impulses w/ 1+bruits; no thyromegaly or nodules palpated; no lymphadenopathy. CHEST:  Clear to P & A; without wheezes or rales, but scat rhonchi heard... HEART:  Regular Rhythm;  gr1/6 SEM without rubs or gallops detected... ABDOMEN:  Soft & nontender; normal bowel sounds; no organomegaly or masses palpated... EXT: without deformities, mild arthritic changes; no varicose veins/ +venous insuffic/ no edema. NEURO:  CN's intact;  no focal neuro deficits, she is weak diffusely... DERM: scarring in the right T9-10 distrib of her prev shingles eruption...   Assessment & Plan:   Chronic Sinusitis>  Long hx chr sinus problems and perforated nasal septum; rec to use Mucinex 1-2 Bid + Fluids and nasal saline mist...  HBP>  BP meds adjusted freq but she has been non-compliant; pt asked to bring all med bottles to every visit; we decided upon LOSARTAN 50mg /d & PINDOLOL 5mg Bid.  Cerebrovasc Dis>  She denies any cerebral ischemic symptoms; no postural BP changes; continue ASA daily...  Venous Insuffic>  Reminded to elim sodium, elevate legs, wear support hose...  CHOL>  She stopped the low dose Crestor also rec by [place] (?it made her weak too?); for  now she is on diet alone & we will discuss Lipid Clinic.  HYPOTHYROID>  On Synthroid 74mcg/d & labs are better, may need decr dose but keep same for now...  GI> GERD, Constip, etc>  rec to take PPI/ Prevacid regularly, and continue Miralax etc; she is followed by drMagod...  DJD, CSpine Disc dis, etc>  Stable on OTC Tylenol & occas Advil...  ANEMIA & B12 Defic>  She remains on Fe one tab daily & B12 shots every month...   Patient's Medications  New Prescriptions   CHLORPHENIRAMINE-HYDROCODONE (TUSSIONEX PENNKINETIC ER) 10-8 MG/5ML LQCR    Take 1 tsp every 12 hrs as needed for cough  Previous Medications   ASPIRIN 81 MG TABLET    Take 81 mg by mouth daily.     B COMPLEX VITAMINS TABLET    Take 1 tablet by mouth daily.     CHOLECALCIFEROL (VITAMIN D) 1000 UNITS CAPSULE    Take 1,000 Units by mouth daily.     GUAIFENESIN (MUCINEX) 600  MG 12 HR TABLET    Take 1,200 mg by mouth 2 (two) times daily.     LANSOPRAZOLE (PREVACID) 30 MG CAPSULE    Take 1 tablet by mouth two times a day    LEVOTHYROXINE (SYNTHROID) 75 MCG TABLET    Take 1 tablet (75 mcg total) by mouth daily.   LOSARTAN (COZAAR) 50 MG TABLET    TAKE 1 TABLET BY MOUTH DAILY   MULTIPLE VITAMIN (MULTIVITAMINS PO)    Take by mouth.     PINDOLOL (VISKEN) 5 MG TABLET    Take 1 tablet (5 mg total) by mouth 2 (two) times daily.   POLYETHYLENE GLYCOL (MIRALAX / GLYCOLAX) PACKET    1 capful in water once or twice daily    POTASSIUM CHLORIDE SA (K-DUR,KLOR-CON) 20 MEQ TABLET    TAKE 1 TABLET BY MOUTH EVERY DAY WITH FOOD  Modified Medications   No medications on file  Discontinued Medications   AMLODIPINE (NORVASC) 5 MG TABLET    Take 1/2 tablet daily     ROSUVASTATIN (CRESTOR) 5 MG TABLET    Take 1/2 tablet daily

## 2011-07-31 ENCOUNTER — Telehealth: Payer: Self-pay | Admitting: Pulmonary Disease

## 2011-07-31 MED ORDER — AZITHROMYCIN 250 MG PO TABS: ORAL_TABLET | ORAL | Status: AC

## 2011-07-31 MED ORDER — HYDROCOD POLST-CHLORPHEN POLST 10-8 MG/5ML PO LQCR: ORAL | Status: DC

## 2011-07-31 NOTE — Telephone Encounter (Signed)
I spoke with sister and she states pt is very weak, loos appetite, dry hard cough, wheezing, chest tightness x 2 days. She states pt can barely walk. She is not sure if she is running a fever. Sister wants pt to be seen today. Please advise Dr. Kriste Basque, thanks

## 2011-07-31 NOTE — Telephone Encounter (Signed)
Per SN----we have seen a lot of this---  1.  If very weak and cant walk--she will need to be evaluated at the ER for this  2.  Otherwise---recs are to rest, increase fluids, zpak #1  Take as directed, mucinex  1-2 po bid with fluids, and use tussionex #4oz  1 tsp every 12 hours prn for cough.  thanks

## 2011-07-31 NOTE — Telephone Encounter (Signed)
Pt sister is aware of SN recs. rx's have been called into pharmacy.

## 2011-08-07 ENCOUNTER — Encounter: Payer: Self-pay | Admitting: Pulmonary Disease

## 2011-08-14 ENCOUNTER — Other Ambulatory Visit: Payer: Self-pay | Admitting: Pulmonary Disease

## 2011-08-29 ENCOUNTER — Emergency Department (HOSPITAL_COMMUNITY): Payer: Medicare Other

## 2011-08-29 ENCOUNTER — Encounter (HOSPITAL_COMMUNITY): Payer: Self-pay

## 2011-08-29 ENCOUNTER — Inpatient Hospital Stay (HOSPITAL_COMMUNITY)
Admission: EM | Admit: 2011-08-29 | Discharge: 2011-09-03 | DRG: 389 | Disposition: A | Discharge status: Home / Self Care | Payer: Medicare Other | Source: Ambulatory Visit | Attending: Internal Medicine | Admitting: Internal Medicine

## 2011-08-29 ENCOUNTER — Other Ambulatory Visit: Payer: Self-pay

## 2011-08-29 DIAGNOSIS — E039 Hypothyroidism, unspecified: Secondary | ICD-10-CM

## 2011-08-29 DIAGNOSIS — E7800 Pure hypercholesterolemia, unspecified: Secondary | ICD-10-CM

## 2011-08-29 DIAGNOSIS — E876 Hypokalemia: Secondary | ICD-10-CM | POA: Diagnosis present

## 2011-08-29 DIAGNOSIS — K5900 Constipation, unspecified: Secondary | ICD-10-CM

## 2011-08-29 DIAGNOSIS — K219 Gastro-esophageal reflux disease without esophagitis: Secondary | ICD-10-CM

## 2011-08-29 DIAGNOSIS — I872 Venous insufficiency (chronic) (peripheral): Secondary | ICD-10-CM

## 2011-08-29 DIAGNOSIS — I10 Essential (primary) hypertension: Secondary | ICD-10-CM

## 2011-08-29 DIAGNOSIS — R55 Syncope and collapse: Secondary | ICD-10-CM

## 2011-08-29 DIAGNOSIS — J4489 Other specified chronic obstructive pulmonary disease: Secondary | ICD-10-CM

## 2011-08-29 DIAGNOSIS — S62109A Fracture of unspecified carpal bone, unspecified wrist, initial encounter for closed fracture: Secondary | ICD-10-CM

## 2011-08-29 DIAGNOSIS — Z8673 Personal history of transient ischemic attack (TIA), and cerebral infarction without residual deficits: Secondary | ICD-10-CM

## 2011-08-29 DIAGNOSIS — Z981 Arthrodesis status: Secondary | ICD-10-CM

## 2011-08-29 DIAGNOSIS — J329 Chronic sinusitis, unspecified: Secondary | ICD-10-CM

## 2011-08-29 DIAGNOSIS — F411 Generalized anxiety disorder: Secondary | ICD-10-CM

## 2011-08-29 DIAGNOSIS — E871 Hypo-osmolality and hyponatremia: Secondary | ICD-10-CM

## 2011-08-29 DIAGNOSIS — D649 Anemia, unspecified: Secondary | ICD-10-CM

## 2011-08-29 DIAGNOSIS — E538 Deficiency of other specified B group vitamins: Secondary | ICD-10-CM

## 2011-08-29 DIAGNOSIS — E079 Disorder of thyroid, unspecified: Secondary | ICD-10-CM

## 2011-08-29 DIAGNOSIS — M1990 Unspecified osteoarthritis, unspecified site: Secondary | ICD-10-CM

## 2011-08-29 DIAGNOSIS — L501 Idiopathic urticaria: Secondary | ICD-10-CM

## 2011-08-29 DIAGNOSIS — A498 Other bacterial infections of unspecified site: Secondary | ICD-10-CM | POA: Diagnosis present

## 2011-08-29 DIAGNOSIS — N390 Urinary tract infection, site not specified: Secondary | ICD-10-CM

## 2011-08-29 DIAGNOSIS — I679 Cerebrovascular disease, unspecified: Secondary | ICD-10-CM

## 2011-08-29 DIAGNOSIS — M5030 Other cervical disc degeneration, unspecified cervical region: Secondary | ICD-10-CM

## 2011-08-29 DIAGNOSIS — J449 Chronic obstructive pulmonary disease, unspecified: Secondary | ICD-10-CM

## 2011-08-29 DIAGNOSIS — B029 Zoster without complications: Secondary | ICD-10-CM

## 2011-08-29 DIAGNOSIS — K5641 Fecal impaction: Principal | ICD-10-CM | POA: Diagnosis present

## 2011-08-29 LAB — COMPREHENSIVE METABOLIC PANEL
ALT: 13 U/L (ref 0–35)
Albumin: 3.8 g/dL (ref 3.5–5.2)
Alkaline Phosphatase: 77 U/L (ref 39–117)
BUN: 11 mg/dL (ref 6–23)
Calcium: 9.6 mg/dL (ref 8.4–10.5)
GFR calc Af Amer: >90 mL/min (ref >90)
Glucose, Bld: 105 mg/dL — ABNORMAL HIGH (ref 70–99)
Potassium: 4.3 mEq/L (ref 3.5–5.1)
Sodium: 119 mEq/L — CL (ref 135–145)
Total Protein: 7 g/dL (ref 6.0–8.3)

## 2011-08-29 LAB — CBC
MCHC: 36.5 g/dL — ABNORMAL HIGH (ref 30.0–36.0)
Platelets: 207 10*3/uL (ref 150–400)
RDW: 14.1 % (ref 11.5–15.5)
WBC: 5 10*3/uL (ref 4.0–10.5)

## 2011-08-29 LAB — URINALYSIS, ROUTINE W REFLEX MICROSCOPIC
Glucose, UA: NEGATIVE mg/dL
Ketones, ur: NEGATIVE mg/dL
Nitrite: NEGATIVE
Specific Gravity, Urine: 1.006 (ref 1.005–1.030)
pH: 7 (ref 5.0–8.0)

## 2011-08-29 LAB — URINE MICROSCOPIC-ADD ON

## 2011-08-29 LAB — DIFFERENTIAL
Basophils Relative: 0 % (ref 0–1)
Eosinophils Absolute: 0 10*3/uL (ref 0.0–0.7)
Eosinophils Relative: 0 % (ref 0–5)
Neutrophils Relative %: 44 % (ref 43–77)

## 2011-08-29 LAB — URINE CULTURE
Colony Count: >=100000
Culture  Setup Time: 201301162013

## 2011-08-29 LAB — POCT I-STAT TROPONIN I: Troponin i, poc: 0 ng/mL (ref 0.00–0.08)

## 2011-08-29 MED ORDER — SODIUM CHLORIDE 0.9 % IV SOLN
INTRAVENOUS | Status: DC
  Administered 2011-08-29: 22:00:00 via INTRAVENOUS

## 2011-08-29 MED ORDER — PINDOLOL 5 MG PO TABS
5.0000 mg | ORAL_TABLET | Freq: Two times a day (BID) | ORAL | Status: DC
  Administered 2011-08-30 – 2011-09-03 (×10): 5 mg via ORAL
  Filled 2011-08-29 (×12): qty 1

## 2011-08-29 MED ORDER — SODIUM CHLORIDE 0.9 % IV SOLN
INTRAVENOUS | Status: DC
  Administered 2011-08-30 (×2): via INTRAVENOUS

## 2011-08-29 MED ORDER — LEVOTHYROXINE SODIUM 75 MCG PO TABS
75.0000 ug | ORAL_TABLET | Freq: Every day | ORAL | Status: DC
  Administered 2011-08-30 – 2011-09-03 (×5): 75 ug via ORAL
  Filled 2011-08-29 (×5): qty 1

## 2011-08-29 MED ORDER — DEXTROSE 5 % IV SOLN
1.0000 g | INTRAVENOUS | Status: DC
  Administered 2011-08-29: 1 g via INTRAVENOUS
  Filled 2011-08-29: qty 10

## 2011-08-29 MED ORDER — ASPIRIN 81 MG PO CHEW
81.0000 mg | CHEWABLE_TABLET | Freq: Every day | ORAL | Status: DC
  Administered 2011-08-30 – 2011-09-03 (×5): 81 mg via ORAL
  Filled 2011-08-29 (×5): qty 1

## 2011-08-29 MED ORDER — ONDANSETRON HCL 4 MG/2ML IJ SOLN: 4.0000 mg | Freq: Four times a day (QID) | INTRAMUSCULAR | Status: DC | PRN

## 2011-08-29 MED ORDER — POLYETHYLENE GLYCOL 3350 17 G PO PACK
17.0000 g | PACK | Freq: Every day | ORAL | Status: DC
  Administered 2011-08-30: 17 g via ORAL
  Filled 2011-08-29: qty 1

## 2011-08-29 MED ORDER — ONDANSETRON HCL 4 MG PO TABS: 4.0000 mg | ORAL_TABLET | Freq: Four times a day (QID) | ORAL | Status: DC | PRN

## 2011-08-29 MED ORDER — SODIUM CHLORIDE 0.9 % IV BOLUS (SEPSIS)
500.0000 mL | Freq: Once | INTRAVENOUS | Status: AC
  Administered 2011-08-29: 500 mL via INTRAVENOUS

## 2011-08-29 MED ORDER — LOSARTAN POTASSIUM 50 MG PO TABS
50.0000 mg | ORAL_TABLET | Freq: Every day | ORAL | Status: DC
  Administered 2011-08-30 – 2011-09-03 (×5): 50 mg via ORAL
  Filled 2011-08-29 (×5): qty 1

## 2011-08-29 MED ORDER — DEXTROSE 5 % IV SOLN
1.0000 g | INTRAVENOUS | Status: DC
  Administered 2011-08-30 – 2011-08-31 (×2): 1 g via INTRAVENOUS
  Filled 2011-08-29 (×3): qty 10

## 2011-08-29 MED ORDER — ACETAMINOPHEN 325 MG PO TABS: 650.0000 mg | ORAL_TABLET | Freq: Four times a day (QID) | ORAL | Status: DC | PRN

## 2011-08-29 MED ORDER — ACETAMINOPHEN 650 MG RE SUPP: 650.0000 mg | Freq: Four times a day (QID) | RECTAL | Status: DC | PRN

## 2011-08-29 MED ORDER — PANTOPRAZOLE SODIUM 40 MG PO TBEC
40.0000 mg | DELAYED_RELEASE_TABLET | Freq: Every day | ORAL | Status: DC
  Administered 2011-08-30 – 2011-09-03 (×5): 40 mg via ORAL
  Filled 2011-08-29 (×5): qty 1

## 2011-08-29 MED ORDER — SODIUM CHLORIDE 0.9 % IV SOLN
Freq: Once | INTRAVENOUS | Status: AC
  Administered 2011-08-29: 19:00:00 via INTRAVENOUS

## 2011-08-29 NOTE — ED Notes (Signed)
Pt complains of being very uncomfortable from constipation, LBM 4 days ago.  Chest pain on left side does not radiate and is intermittent.  Per daughter pt has thrown x2 in 12 hours which daughter states she never does.  Pt has cough, daughter states it sounds dry, pt states she is able to cough up mucus

## 2011-08-29 NOTE — ED Notes (Signed)
EKG completed at 1629 and given to Dr. Weldon Inches along with OLD ekg.

## 2011-08-29 NOTE — ED Notes (Signed)
[id number] Ready 

## 2011-08-29 NOTE — ED Notes (Signed)
Patient back from X-Ray.

## 2011-08-29 NOTE — ED Provider Notes (Signed)
History     CSN: [id number]  Arrival date & time 08/29/11  1620   First MD Initiated Contact with Patient 08/29/11 1806      Chief Complaint  Patient presents with  . Chest Pain    (Consider location/radiation/quality/duration/timing/severity/associated sxs/prior treatment) Patient is a 78 y.o. female presenting with chest pain. The history is provided by the patient and a relative.  Chest Pain    patient presents with abdominal pain and chest pain. Patient's chest pain has been intermittent in nature for the last 3 days lasting for minutes to seconds. It is not exertional, she has been slightly short of breath with a cough however no fever. Nothing makes her chest pain better or worse. No associated diaphoresis. Pain is nonradiating and is described as being vague in nature.  Patient also complains of having abdominal pain and constipation. She has had emesis x2. Does note some dysuria and malodorous urine. No flank pain. Patient's last bowel movement was 4 days ago and does have a history of constipation. She has been using MiraLAX without relief  Past Medical History  Diagnosis Date  . Unspecified sinusitis (chronic)   . Unspecified essential hypertension   . Cerebrovascular disease, unspecified   . Unspecified venous (peripheral) insufficiency   . Pure hypercholesterolemia   . Unspecified disorder of thyroid   . Esophageal reflux   . Unspecified constipation   . Urinary tract infection, site not specified   . Osteoarthrosis, unspecified whether generalized or localized, unspecified site   . Degeneration of cervical intervertebral disc   . Closed fracture of other bone of wrist   . Anxiety state, unspecified   . Idiopathic urticaria   . Anemia, unspecified   . Other B-complex deficiencies   . Herpes zoster without mention of complication     Past Surgical History  Procedure Date  . Tonsillectomy   . Total abdominal hysterectomy w/ bilateral salpingoophorectomy   .  Cervical fusion   . Cataract extraction   . Nasal sinus surgery     Family History  Problem Relation Age of Onset  . Diabetes Father   . Heart failure Mother   . Diabetes Sister     History  Substance Use Topics  . Smoking status: Never Smoker   . Smokeless tobacco: Never Used  . Alcohol Use: No    OB History    Grav Para Term Preterm Abortions TAB SAB Ect Mult Living                  Review of Systems  Cardiovascular: Positive for chest pain.  All other systems reviewed and are negative.    Allergies  Review of patient's allergies indicates no known allergies.  Home Medications   Current Outpatient Rx  Name Route Sig Dispense Refill  . ASPIRIN 81 MG PO TABS Oral Take 81 mg by mouth daily.      . B COMPLEX PO TABS Oral Take 1 tablet by mouth daily.      Marland Kitchen HYDROCOD POLST-CPM POLST ER 10-8 MG/5ML PO LQCR Oral Take 5 mLs by mouth every 12 (twelve) hours. Take 1 tsp every 12 hrs as needed for cough    . VITAMIN D 1000 UNITS PO CAPS Oral Take 1,000 Units by mouth daily.      . GUAIFENESIN ER 600 MG PO TB12 Oral Take 1,200 mg by mouth 2 (two) times daily.      Marland Kitchen LANSOPRAZOLE 30 MG PO CPDR  Take 1 tablet by mouth  two times a day     . LEVOTHYROXINE SODIUM 75 MCG PO TABS Oral Take 1 tablet (75 mcg total) by mouth daily. 30 tablet 11  . LOSARTAN POTASSIUM 50 MG PO TABS  TAKE 1 TABLET BY MOUTH DAILY 30 tablet 0  . MULTIVITAMINS PO Oral Take by mouth.      Marland Kitchen PINDOLOL 5 MG PO TABS Oral Take 1 tablet (5 mg total) by mouth 2 (two) times daily. 60 tablet 12  . POLYETHYLENE GLYCOL 3350 PO PACK  1 capful in water once or twice daily     . POTASSIUM CHLORIDE CRYS ER 20 MEQ PO TBCR  TAKE 1 TABLET BY MOUTH EVERY DAY WITH FOOD 30 tablet 4    BP 181/81  Pulse 67  Temp(Src) 97.7 F (36.5 C) (Oral)  Resp 16  SpO2 95%  Physical Exam  Nursing note and vitals reviewed. Constitutional: She is oriented to person, place, and time. She appears well-developed and well-nourished.   Non-toxic appearance. No distress.  HENT:  Head: Normocephalic and atraumatic.  Eyes: Conjunctivae, EOM and lids are normal. Pupils are equal, round, and reactive to light.  Neck: Normal range of motion. Neck supple. No tracheal deviation present. No mass present.  Cardiovascular: Normal rate, regular rhythm and normal heart sounds.  Exam reveals no gallop.   No murmur heard. Pulmonary/Chest: Effort normal and breath sounds normal. No stridor. No respiratory distress. She has no decreased breath sounds. She has no wheezes. She has no rhonchi. She has no rales.  Abdominal: Soft. Normal appearance and bowel sounds are normal. She exhibits no distension. There is generalized tenderness. There is no rigidity, no rebound, no guarding and no CVA tenderness.  Musculoskeletal: Normal range of motion. She exhibits no edema and no tenderness.  Neurological: She is alert and oriented to person, place, and time. She has normal strength. No cranial nerve deficit or sensory deficit. GCS eye subscore is 4. GCS verbal subscore is 5. GCS motor subscore is 6.  Skin: Skin is warm and dry. No abrasion and no rash noted.  Psychiatric: She has a normal mood and affect. Her speech is normal and behavior is normal.    ED Course  Procedures (including critical care time)   Labs Reviewed  CBC  DIFFERENTIAL  COMPREHENSIVE METABOLIC PANEL  URINALYSIS, ROUTINE W REFLEX MICROSCOPIC  URINE CULTURE  I-STAT TROPONIN I  LIPASE, BLOOD  LACTIC ACID, PLASMA   No results found.   No diagnosis found.    MDM  Patient given IV fluids here. Patient started on Rocephin for her urinary tract infection. She was manually disimpacted with copious amounts of stool noted. Spoke with [facility]ad hospitalist and she will be admitted        Toy Baker, MD 08/29/11 2155

## 2011-08-29 NOTE — ED Notes (Signed)
Emptied

## 2011-08-29 NOTE — H&P (Signed)
Margaret [name] is an 78 y.o. female.   PCP -Dr.Scott Lennon Alstrom. Chief Complaint: Abdominal pain. HPI: 78 year-old female with history of chronic constipation, GERD, hypertension, previous history of CVA, chronic hyponatremia present to the ER because of increasing abdominal pain with nausea and vomiting. Patient states abdominal pain is suprapubic and left lower quadrant. In the ER patient had a CT abdomen and pelvis which did not show any acute except for constipation. Patient had some stool disimpacted in the ER and patient felt better. Patient's sodium was found to be 119 and also was found to have UTI. Patient states that over the last 2-3 days she was unable to eat well and had at least 2-3 episodes of nausea vomiting. Denies any chest pain shortness of breath dizziness or loss of consciousness.  Past Medical History  Diagnosis Date  . Unspecified sinusitis (chronic)   . Unspecified essential hypertension   . Cerebrovascular disease, unspecified   . Unspecified venous (peripheral) insufficiency   . Pure hypercholesterolemia   . Unspecified disorder of thyroid   . Esophageal reflux   . Unspecified constipation   . Urinary tract infection, site not specified   . Osteoarthrosis, unspecified whether generalized or localized, unspecified site   . Degeneration of cervical intervertebral disc   . Closed fracture of other bone of wrist   . Anxiety state, unspecified   . Idiopathic urticaria   . Anemia, unspecified   . Other B-complex deficiencies   . Herpes zoster without mention of complication     Past Surgical History  Procedure Date  . Tonsillectomy   . Total abdominal hysterectomy w/ bilateral salpingoophorectomy   . Cervical fusion   . Cataract extraction   . Nasal sinus surgery     Family History  Problem Relation Age of Onset  . Diabetes Father   . Heart failure Mother   . Diabetes Sister    Social History:  reports that she has never smoked. She has never used smokeless  tobacco. She reports that she does not drink alcohol or use illicit drugs.  Allergies: No Known Allergies  Medications Prior to Admission  Medication Dose Route Frequency Provider Last Rate Last Dose  . 0.9 %  sodium chloride infusion   Intravenous Once Toy Baker, MD      . 0.9 %  sodium chloride infusion   Intravenous STAT Toy Baker, MD 150 mL/hr at 08/29/11 2216    . cefTRIAXone (ROCEPHIN) 1 g in dextrose 5 % 50 mL IVPB  1 g Intravenous Q24H Toy Baker, MD   1 g at 08/29/11 2114  . sodium chloride 0.9 % bolus 500 mL  500 mL Intravenous Once Toy Baker, MD   500 mL at 08/29/11 2150   Medications Prior to Admission  Medication Sig Dispense Refill  . aspirin 81 MG tablet Take 81 mg by mouth daily.        Marland Kitchen b complex vitamins tablet Take 1 tablet by mouth daily.        . chlorpheniramine-HYDROcodone (TUSSIONEX) 10-8 MG/5ML LQCR Take 5 mLs by mouth every 12 (twelve) hours. Take 1 tsp every 12 hrs as needed for cough      . Cholecalciferol (VITAMIN D) 1000 UNITS capsule Take 1,000 Units by mouth daily.        Marland Kitchen guaiFENesin (MUCINEX) 600 MG 12 hr tablet Take 1,200 mg by mouth 2 (two) times daily.        . lansoprazole (PREVACID) 30 MG capsule  Take 1 tablet by mouth two times a day       . levothyroxine (SYNTHROID) 75 MCG tablet Take 1 tablet (75 mcg total) by mouth daily.  30 tablet  11  . losartan (COZAAR) 50 MG tablet TAKE 1 TABLET BY MOUTH DAILY  30 tablet  0  . Multiple Vitamin (MULTIVITAMINS PO) Take by mouth.        . pindolol (VISKEN) 5 MG tablet Take 1 tablet (5 mg total) by mouth 2 (two) times daily.  60 tablet  12  . polyethylene glycol (MIRALAX / GLYCOLAX) packet 1 capful in water once or twice daily       . potassium chloride SA (K-DUR,KLOR-CON) 20 MEQ tablet TAKE 1 TABLET BY MOUTH EVERY DAY WITH FOOD  30 tablet  4    Results for orders placed during the hospital encounter of 08/29/11 (from the past 48 hour(s))  LACTIC ACID, PLASMA     Status: Normal    Collection Time   08/29/11  6:25 PM      Component Value Range Comment   Lactic Acid, Venous 0.5  0.5 - 2.2 (mmol/L)   CBC     Status: Abnormal   Collection Time   08/29/11  6:28 PM      Component Value Range Comment   WBC 5.0  4.0 - 10.5 (K/uL)    RBC 3.41 (*) 3.87 - 5.11 (MIL/uL)    Hemoglobin 10.3 (*) 12.0 - 15.0 (g/dL)    HCT 40.9 (*) 81.1 - 46.0 (%)    MCV 82.7  78.0 - 100.0 (fL)    MCH 30.2  26.0 - 34.0 (pg)    MCHC 36.5 (*) 30.0 - 36.0 (g/dL)    RDW 91.4  78.2 - 95.6 (%)    Platelets 207  150 - 400 (K/uL)   DIFFERENTIAL     Status: Abnormal   Collection Time   08/29/11  6:28 PM      Component Value Range Comment   Neutrophils Relative 44  43 - 77 (%)    Neutro Abs 2.2  1.7 - 7.7 (K/uL)    Lymphocytes Relative 48 (*) 12 - 46 (%)    Lymphs Abs 2.4  0.7 - 4.0 (K/uL)    Monocytes Relative 8  3 - 12 (%)    Monocytes Absolute 0.4  0.1 - 1.0 (K/uL)    Eosinophils Relative 0  0 - 5 (%)    Eosinophils Absolute 0.0  0.0 - 0.7 (K/uL)    Basophils Relative 0  0 - 1 (%)    Basophils Absolute 0.0  0.0 - 0.1 (K/uL)   COMPREHENSIVE METABOLIC PANEL     Status: Abnormal   Collection Time   08/29/11  6:28 PM      Component Value Range Comment   Sodium 119 (*) 135 - 145 (mEq/L)    Potassium 4.3  3.5 - 5.1 (mEq/L)    Chloride 84 (*) 96 - 112 (mEq/L)    CO2 26  19 - 32 (mEq/L)    Glucose, Bld 105 (*) 70 - 99 (mg/dL)    BUN 11  6 - 23 (mg/dL)    Creatinine, Ser 2.13  0.50 - 1.10 (mg/dL)    Calcium 9.6  8.4 - 10.5 (mg/dL)    Total Protein 7.0  6.0 - 8.3 (g/dL)    Albumin 3.8  3.5 - 5.2 (g/dL)    AST 26  0 - 37 (U/L)    ALT 13  0 - 35 (  U/L)    Alkaline Phosphatase 77  39 - 117 (U/L)    Total Bilirubin 0.5  0.3 - 1.2 (mg/dL)    GFR calc non Af Amer 86 (*) >90 (mL/min)    GFR calc Af Amer >90  >90 (mL/min)   LIPASE, BLOOD     Status: Normal   Collection Time   08/29/11  6:28 PM      Component Value Range Comment   Lipase 47  11 - 59 (U/L)   URINALYSIS, ROUTINE W REFLEX MICROSCOPIC      Status: Abnormal   Collection Time   08/29/11  6:45 PM      Component Value Range Comment   Color, Urine YELLOW  YELLOW     APPearance CLOUDY (*) CLEAR     Specific Gravity, Urine 1.006  1.005 - 1.030     pH 7.0  5.0 - 8.0     Glucose, UA NEGATIVE  NEGATIVE (mg/dL)    Hgb urine dipstick SMALL (*) NEGATIVE     Bilirubin Urine NEGATIVE  NEGATIVE     Ketones, ur NEGATIVE  NEGATIVE (mg/dL)    Protein, ur NEGATIVE  NEGATIVE (mg/dL)    Urobilinogen, UA 0.2  0.0 - 1.0 (mg/dL)    Nitrite NEGATIVE  NEGATIVE     Leukocytes, UA LARGE (*) NEGATIVE    URINE MICROSCOPIC-ADD ON     Status: Abnormal   Collection Time   08/29/11  6:45 PM      Component Value Range Comment   Squamous Epithelial / LPF FEW (*) RARE     WBC, UA TOO NUMEROUS TO COUNT  <3 (WBC/hpf)    RBC / HPF 0-2  <3 (RBC/hpf)    Bacteria, UA MANY (*) RARE    POCT I-STAT TROPONIN I     Status: Normal   Collection Time   08/29/11  6:55 PM      Component Value Range Comment   Troponin i, poc 0.00  0.00 - 0.08 (ng/mL)    Comment 3             Dg Abd Acute W/chest  08/29/2011  *RADIOLOGY REPORT*  Clinical Data: Constipation.  History of proctitis.  ACUTE ABDOMEN SERIES (ABDOMEN 2 VIEW & CHEST 1 VIEW)  Comparison: CT abdomen and pelvis 06/09/2010.  Acute abdomen series 06/08/2010.  Findings: COPD with hyperinflation.  Clear lung fields.  No effusion or pneumothorax.  Normal heart size.  Calcified tortuous aorta.  No free air.  Previous cervical fusion.  Fecal impaction. Vascular calcification.  Degenerative disc disease L3-L4.  Hip DJD. No bowel obstruction or significant air fluid levels.  IMPRESSION: COPD.  Fecal impaction.  No definite active process.  Original Report Authenticated By: Elsie Stain, M.D.    Review of Systems  Constitutional: Negative.   HENT: Negative.   Eyes: Negative.   Respiratory: Negative.   Cardiovascular: Negative.   Gastrointestinal: Positive for heartburn, nausea and abdominal pain.  Genitourinary:  Negative.   Musculoskeletal: Negative.   Skin: Negative.   Neurological: Negative.   Endo/Heme/Allergies: Negative.   Psychiatric/Behavioral: Negative.     Blood pressure 158/63, pulse 69, temperature 98.2 F (36.8 C), temperature source Oral, resp. rate 15, SpO2 100.00%. Physical Exam  Constitutional: She is oriented to person, place, and time. She appears well-developed. No distress.       CACHECTIC.  HENT:  Head: Normocephalic and atraumatic.  Right Ear: External ear normal.  Left Ear: External ear normal.  Nose: Nose normal.  Mouth/Throat:  Oropharynx is clear and moist. No oropharyngeal exudate.  Eyes: Conjunctivae are normal. Pupils are equal, round, and reactive to light. Right eye exhibits no discharge. Left eye exhibits no discharge. No scleral icterus.  Neck: Normal range of motion. Neck supple.  Cardiovascular: Normal rate, regular rhythm and normal heart sounds.   Respiratory: Effort normal and breath sounds normal. No respiratory distress. She has no wheezes. She has no rales.  GI: Soft. Bowel sounds are normal. She exhibits no distension. There is no tenderness. There is no rebound.  Musculoskeletal: Normal range of motion. She exhibits no edema and no tenderness.  Neurological: She is alert and oriented to person, place, and time.       Moves upper and lower limbs.  Skin: Skin is warm and dry. No rash noted. She is not diaphoretic. No erythema.  Psychiatric: Her behavior is normal.     Assessment/Plan #1. Abdominal pain - this is most likely from constipation and UTI. Patient had some disimpaction of the feces done in the ER. We will order her a soap suds enema. Patient has been started on ceftriaxone UTI. We will check urine culture. #2. Severe hyponatremia - patient does have chronic hyponatremia but in the past IV hydration did improve her sodium. We will check a stat urine sodium and also urine osmolality. Check TSH. Closely follow metabolic panel for now I have  ordered every 4 hours. #3. UTI - continue ceftriaxone check urine cultures. #4. History of chronic dysphagia requiring dilation - presently patient does not complain of any dysphagia. #5. History of hypertension and hypothyroidism - continue present medications.  CODE STATUS - full code.  Toshiko Kemler N. 08/29/2011, 11:16 PM

## 2011-08-29 NOTE — ED Notes (Signed)
Pt presents with 3 day h/o L sided chest pain.  Pt reports pain is intermittent and does not radiate.  Daughter reports pt has vomited x 2 since yesterday, has productive cough with foamy white phlegm, reports shortness of breath.  Pt reports 3-4 day h/o constipation, has taken miralax with no results.  Pt also report dysuria with foul smelling urine.

## 2011-08-30 ENCOUNTER — Encounter (HOSPITAL_COMMUNITY): Payer: Self-pay | Admitting: General Practice

## 2011-08-30 ENCOUNTER — Inpatient Hospital Stay (HOSPITAL_COMMUNITY): Payer: Medicare Other

## 2011-08-30 LAB — BASIC METABOLIC PANEL
BUN: 7 mg/dL (ref 6–23)
BUN: 7 mg/dL (ref 6–23)
BUN: 7 mg/dL (ref 6–23)
CO2: 26 mEq/L (ref 19–32)
CO2: 27 mEq/L (ref 19–32)
CO2: 28 mEq/L (ref 19–32)
CO2: 29 mEq/L (ref 19–32)
Calcium: 9 mg/dL (ref 8.4–10.5)
Calcium: 9.6 mg/dL (ref 8.4–10.5)
Chloride: 89 mEq/L — ABNORMAL LOW (ref 96–112)
Chloride: 90 mEq/L — ABNORMAL LOW (ref 96–112)
Chloride: 92 mEq/L — ABNORMAL LOW (ref 96–112)
Creatinine, Ser: 0.42 mg/dL — ABNORMAL LOW (ref 0.50–1.10)
Creatinine, Ser: 0.48 mg/dL — ABNORMAL LOW (ref 0.50–1.10)
Creatinine, Ser: 0.51 mg/dL (ref 0.50–1.10)
Glucose, Bld: 100 mg/dL — ABNORMAL HIGH (ref 70–99)
Glucose, Bld: 102 mg/dL — ABNORMAL HIGH (ref 70–99)
Glucose, Bld: 107 mg/dL — ABNORMAL HIGH (ref 70–99)
Glucose, Bld: 97 mg/dL (ref 70–99)
Potassium: 3.8 mEq/L (ref 3.5–5.1)
Potassium: 3.4 mEq/L — ABNORMAL LOW (ref 3.5–5.1)
Sodium: 125 mEq/L — ABNORMAL LOW (ref 135–145)
Sodium: 127 mEq/L — ABNORMAL LOW (ref 135–145)

## 2011-08-30 LAB — CBC
HCT: 31.3 % — ABNORMAL LOW (ref 36.0–46.0)
MCH: 29.8 pg (ref 26.0–34.0)
MCHC: 35.5 g/dL (ref 30.0–36.0)
MCV: 83.9 fL (ref 78.0–100.0)
Platelets: 219 10*3/uL (ref 150–400)
RDW: 14.2 % (ref 11.5–15.5)
WBC: 4.6 10*3/uL (ref 4.0–10.5)

## 2011-08-30 LAB — TSH: TSH: 4.683 u[IU]/mL — ABNORMAL HIGH (ref 0.350–4.500)

## 2011-08-30 MED ORDER — HYDRALAZINE HCL 20 MG/ML IJ SOLN
10.0000 mg | INTRAMUSCULAR | Status: DC | PRN
  Filled 2011-08-30: qty 0.5

## 2011-08-30 MED ORDER — POLYETHYLENE GLYCOL 3350 17 G PO PACK
17.0000 g | PACK | ORAL | Status: AC
  Administered 2011-08-30 (×2): 17 g via ORAL
  Filled 2011-08-30 (×3): qty 1

## 2011-08-30 MED ORDER — FLEET ENEMA 7-19 GM/118ML RE ENEM
1.0000 | ENEMA | RECTAL | Status: AC
  Filled 2011-08-30 (×2): qty 1

## 2011-08-30 MED ORDER — DOCUSATE SODIUM 100 MG PO CAPS
100.0000 mg | ORAL_CAPSULE | Freq: Two times a day (BID) | ORAL | Status: DC
  Administered 2011-08-30 – 2011-09-03 (×7): 100 mg via ORAL
  Filled 2011-08-30 (×10): qty 1

## 2011-08-30 MED ORDER — PNEUMOCOCCAL VAC POLYVALENT 25 MCG/0.5ML IJ INJ
0.5000 mL | INJECTION | INTRAMUSCULAR | Status: AC
  Administered 2011-08-31: 0.5 mL via INTRAMUSCULAR
  Filled 2011-08-30: qty 0.5

## 2011-08-30 NOTE — Progress Notes (Signed)
Utilization Review Completed.  Genella Bas T  08/30/2011   

## 2011-08-30 NOTE — Progress Notes (Signed)
Patient seen, examined and discussed with Algis Downs PA; she is currently better and by the time I saw her she has had 2 BM;s now. Will repeat abd x-ray in am to follow on impaction; continue bowel regimen and also antibiotics for UTI. She is feeling better and no abd pain now.  For her dysphagia, she will have barium swallowing study done tomorrow by SPT.  Vassie Loll [phone]

## 2011-08-30 NOTE — Clinical Documentation Improvement (Signed)
POA DOCUMENTATION CLARIFICATION QUERY  THIS DOCUMENT IS NOT A PERMANENT PART OF THE MEDICAL RECORD  TO RESPOND TO THE THIS QUERY, FOLLOW THE INSTRUCTIONS BELOW:  1. If needed, update documentation for the patient's encounter via the notes activity.  2. Access this query again and click edit on the In [employer].  3. After updating, or not, click F2 to complete all highlighted (required) fields concerning your review. Select "additional documentation in the medical record" OR "no additional documentation provided".  4. Click Sign note button.  5. The deficiency will fall out of your In Basket *Please let us know if you are not able to complete this workflow by phone or e-mail (listed below).  Please update your documentation within the medical record to reflect your response to this query.                                                                                    08/30/11  Dear Dr. Salena Saner. [name]Associ[facility]  In a better effort to capture your patient's severity of illness, reflect appropriate length of stay and utilization of resources, a review of the patient medical record has revealed the following indicators.    Based on your clinical judgment, please clarify and document in a progress note and/or discharge summary the clinical condition associated with the following supporting information:  In responding to this query please exercise your independent judgment.  The fact that a query is asked, does not imply that any particular answer is desired or expected.  Per documentation one of the admitting diagnoses is "constipation" however patient has history of fecal impactions, which is also being documented .   Please clarify if this patient had a "fecal impaction" on admit versus being "constipated" for accuracy of chart.  Thank you   Medicare rules require specification as to whether an inpatient diagnosis was present at the time of admission.  Please clarify if the following  diagnosis, Fecal impaction              , was:     _ Present at the time of admission  _ NOT present at the time of inpatient admission and it developed during the inpatient stay  _ Unable to clinically determine whether the condition was present on admission.  _  Documentation insufficient to determine if condition was present at the time of inpatient admission  You may use possible, probable, or suspect with inpatient documentation. possible, probable, suspected diagnoses MUST be documented at the time of discharge  Reviewed: Fecal impaction in the setting of chronic constipation hx.  Thank You,  Lavonda Jumbo  Clinical Documentation Specialist RN, BSN, CDS:  Pager [phone]  [facility] Information [facility] [place]

## 2011-08-30 NOTE — Progress Notes (Signed)
Pt had a b/p of 194/79. Dr. Toniann Fail was notified. Will continue to monitor. Gwynne Edinger, RN

## 2011-08-30 NOTE — Progress Notes (Signed)
   CARE MANAGEMENT NOTE 08/30/2011  Patient:  Margaret [name], Margaret [name]   Account Number:  [id number]  Date Initiated:  08/30/2011  Documentation initiated by:  Junius Creamer  Subjective/Objective Assessment:   adm w abd pain     Action/Plan:   lives alone, pcp dr [name]   Anticipated DC Date:  09/01/2011   Anticipated DC Plan:  HOME W HOME HEALTH SERVICES      DC Planning Services  CM consult      Choice offered to / List presented to:             Status of service:   Medicare Important Message given?   (If response is "NO", the following Medicare IM given date fields will be blank) Date Medicare IM given:   Date Additional Medicare IM given:    Discharge Disposition:    Per UR Regulation:  Reviewed for med. necessity/level of care/duration of stay  Comments:  1/17  spoke w pt, she wants to go home and refuses nsg home, await pt/sp ther eval, lives alone w da Charlestine Night [phone] ck on her. pt gave me permission to speak w da. explained medicare hhc and da and pt interested in hhc for rn-pt-sp ther-sw-bathaid if needed. will await phy ther rec.debbie Aubriella Perezgarcia rn,bsn [phone]

## 2011-08-30 NOTE — Progress Notes (Signed)
Speech Language/Pathology Clinical/Bedside Swallow Evaluation  Past Medical History:  Past Medical History  Diagnosis Date  . Unspecified sinusitis (chronic)   . Unspecified essential hypertension   . Cerebrovascular disease, unspecified   . Unspecified venous (peripheral) insufficiency   . Pure hypercholesterolemia   . Unspecified disorder of thyroid   . Esophageal reflux   . Unspecified constipation   . Urinary tract infection, site not specified   . Osteoarthrosis, unspecified whether generalized or localized, unspecified site   . Degeneration of cervical intervertebral disc   . Closed fracture of other bone of wrist   . Anxiety state, unspecified   . Idiopathic urticaria   . Anemia, unspecified   . Other B-complex deficiencies   . Herpes zoster without mention of complication    Past Surgical History:  Past Surgical History  Procedure Date  . Tonsillectomy   . Total abdominal hysterectomy w/ bilateral salpingoophorectomy   . Cervical fusion   . Cataract extraction   . Nasal sinus surgery    HPI:  78 year-old female with history of chronic constipation, GERD, hypertension, previous history of CVA, chronic hyponatremia presented to the ER with worsening abdominal pain, nausea, and vomiting. CT abdomen and pelvis did not show any acute problems except for constipation.  Patient states that over the last 2-3 days she was unable to eat well and had at least 2-3 episodes of nausea vomiting.  History of difficulty swallowing; has had at least four esophageal dilatations since 1999, the last in '08.  Cervical fusion approximately eleven years ago, per daughter.  In the last few months, pt/daughter describe more difficulty with eating, choking, and food lodging substernally.  Dtr. reports pt eats mostly sweet foods, Ensure, ice cream, and other soft items.  Bedside swallow eval ordered to determine source of dysphagia.   Assessment/Recommendations/Treatment Plan Suspected Esophageal  Findings Suspected Esophageal Findings: Globus sensation   Clinical Impression: pt presents with oropharyngeal dysphagia with known hx of esophageal involvement.  Presents with bilateral LMN involvement, specifically CN VII (poor labial mobility, reduced bilateral eye closure)  and symptoms of CN X impairment with reduced hyolaryngeal excursion.  Functionally, pt unable to maintain labial seal/inadequate intraoral pressure for swallow initiation;  likely pharyngeal residue given reduced laryngeal mobility, multiple (5-7) swallows to clear bolus, palpable/audible congestion at level of larynx.  Pt with intermittent cough throughout study.  Her lungs are clear and xray shows no acute pulmonary changes. Recommend proceeding with MBS to delineate etiology of dysphagia.  Continue current diet pending eval given stable lung condition.  Risk for Aspiration: Moderate Other Related Risk Factors: History of dysphagia;History of GERD;History of esophageal-related issues;Previous CVA  Swallow Evaluation Recommendations Recommended Consults: MBS Solid Consistency: Regular Liquid Consistency: Thin Liquid Administration via: Cup Medication Administration: Whole meds with liquid Supervision: Patient able to self feed Postural Changes and/or Swallow Maneuvers: Seated upright 90 degrees Oral Care Recommendations: Oral care BID Follow up Recommendations:  (tba)   Margaret [name], [place] CCC/SLP Pager [phone] Margaret [name] 08/30/2011,12:05 PM

## 2011-08-30 NOTE — Progress Notes (Signed)
Patient ID: Margaret [name], female   DOB: 1924-02-16, 78 y.o.   MRN: [id number]  PCP:  Dr. Alroy Dust  78 year-old female with history of chronic constipation, GERD, hypertension, previous history of CVA, chronic hyponatremia present to the ER because of increasing abdominal pain with nausea and vomiting. Patient states abdominal pain is suprapubic and left lower quadrant. In the ER patient had a CT abdomen and pelvis which did not show any acute except for constipation. Patient had some stool disimpacted in the ER and patient felt better. Patient's sodium was found to be 119 and also was found to have UTI. Patient states that over the last 2-3 days she was unable to eat well and had at least 2-3 episodes of nausea vomiting.  Per her daughter Charlestine Night (cell phone [phone]): 1.  The patient has recurrent fecal impactions (5 so far).  Is a GI Patient of Dr. Berton Lan. 2.  The patient reports frequent coughing when eating, also reports food getting stuck in her throat.  She has a history of multiple esophageal dilations by Dr. Ewing Schlein, the last one being in 11/11 3.  Most of all the daughter is concerned about her mother being home alone.  Concerned she will fall or pass out.  Does not feel her mother is ready for SNF - would rather bring resources in to the home if there was an affordable option.   Subjective: I feel a little better.  No stools over night.  No vomiting or nausea.  Objective: Weight change:   Intake/Output Summary (Last 24 hours) at 08/30/11 1049 Last data filed at 08/30/11 5284  Gross per 24 hour  Intake 646.67 ml  Output    201 ml  Net 445.67 ml   Blood pressure 188/70, pulse 73, temperature 97.5 F (36.4 C), temperature source Oral, resp. rate 20, height 5\' 6"  (1.676 m), weight 46.3 kg (102 lb 1.2 oz), SpO2 99.00%. Filed Vitals:   08/29/11 2245 08/29/11 2300 08/29/11 2359 08/30/11 0524  BP: 144/95 158/63 192/79 188/70  Pulse:   72 73  Temp:   97.4 F (36.3 C) 97.5  F (36.4 C)  TempSrc:   Oral Oral  Resp: 17 15 18 20   Height:   5\' 6"  (1.676 m)   Weight:   46.3 kg (102 lb 1.2 oz)   SpO2:   96% 99%    Physical Exam: General: No acute distress, pleasant elderly, frail appearing female. Slightly hard of hearing. Lungs: Clear to auscultation bilaterally without wheezes or crackles.  Chest cachetic. Cardiovascular: Regular rate and rhythm without murmur gallop or rub normal S1 and S2 Abdomen: Nontender, nondistended, soft, bowel sounds positive, no rebound, no ascites, no appreciable mass Extremities: No significant cyanosis, clubbing, or edema bilateral lower extremities Neuro:  Some facial asymetry.  Mild confusion.  Felt to be chronic  Basic Metabolic Panel:  Lab 08/30/11 1324 08/30/11 0259  NA 129* 127*  K 3.4* 3.8  CL 92* 90*  CO2 26 28  GLUCOSE 97 100*  BUN 7 7  CREATININE 0.42* 0.47*  CALCIUM 9.0 9.4  MG -- --  PHOS -- --   Liver Function Tests:  Lab 08/29/11 1828  AST 26  ALT 13  ALKPHOS 77  BILITOT 0.5  PROT 7.0  ALBUMIN 3.8    Lab 08/29/11 1828  LIPASE 47  AMYLASE --  CBC:  Lab 08/30/11 0259 08/29/11 1828  WBC 4.6 5.0  NEUTROABS -- 2.2  HGB 11.1* 10.3*  HCT 31.3*  28.2*  MCV 83.9 82.7  PLT 219 207   Urinalysis:   Studies/Results: Scheduled Meds:   . sodium chloride   Intravenous Once  . aspirin  81 mg Oral Daily  . cefTRIAXone (ROCEPHIN)  IV  1 g Intravenous Q24H  . levothyroxine  75 mcg Oral Daily  . losartan  50 mg Oral Daily  . pantoprazole  40 mg Oral Daily  . pindolol  5 mg Oral BID  . pneumococcal 23 valent vaccine  0.5 mL Intramuscular Tomorrow-1000  . polyethylene glycol  17 g Oral Q1 Hr x 3  . sodium chloride  500 mL Intravenous Once  . sodium phosphate  1 enema Rectal Q2H  . DISCONTD: sodium chloride   Intravenous STAT  . DISCONTD: cefTRIAXone (ROCEPHIN)  IV  1 g Intravenous Q24H  . DISCONTD: polyethylene glycol  17 g Oral Daily   Continuous Infusions:   . DISCONTD: sodium chloride 100  mL/hr at 08/30/11 0919   PRN Meds:.acetaminophen, acetaminophen, hydrALAZINE, ondansetron (ZOFRAN) IV, ondansetron  Anti-infectives:  Anti-infectives     Start     Dose/Rate Route Frequency Ordered Stop   08/30/11 2100   cefTRIAXone (ROCEPHIN) 1 g in dextrose 5 % 50 mL IVPB        1 g 100 mL/hr over 30 Minutes Intravenous Every 24 hours 08/29/11 2337     08/29/11 2115   cefTRIAXone (ROCEPHIN) 1 g in dextrose 5 % 50 mL IVPB  Status:  Discontinued        1 g 100 mL/hr over 30 Minutes Intravenous Every 24 hours 08/29/11 2103 08/29/11 2342          Assessment/Plan: Principal Problem:  *Abdominal pain Active Problems:  HYPERTENSION  Hypothyroidism  UTI (lower urinary tract infection)  Hyponatremia   #1. Abdominal pain - this is most likely from constipation and UTI. Patient had some disimpaction of the feces done in the ER. We will order her 2 fleets enemas. And 3 doses of miralax.  Follow up Abdominal Xray in am.  Will bump up stool softners as an outpatient.  #2. Severe hyponatremia - patient does have chronic hyponatremia likely from poor PO intake.  IV fluids are correcting her sodium.  She has moved from 119 to 129 overnight.  Will hold IV fluids for today, so sodium is not corrected to quickly.  Consider restarting fluids tomorrow if needed.  Will obtain nutrition consult.  Patient's daughter reports she eats a lot of dairy at home and virtually nothing else.  #3. UTI - continue ceftriaxone check urine cultures.  Will change to Oral AB tomorrow  based on pending culture results.  #4. History of chronic dysphagia requiring dilation.  Patient complaining of both coughing with eating (aspiration? - oro pharyngeal?) and foods getting stuck and coming back up.  I have orders a ST consultation to rule out aspiration, and I have spoken to Dr. Ewing Schlein.  If she needs dilation he would prefer to do it as an out patient during the week of 09/10/11 (his [place] week).  #5. History of  hypertension and hypothyroidism - continue present medications.  BP running high - possibly due to discomfort of impaction.  PRN Hydralazine ordered in addition to her home meds.  #6.  Disposition:  Home with Assistance or Possibly ALF.  SW and CM have been consulted.  Daughter requests that she be contacted at [phone] regarding disposition options.   LOS: 1 day   Stephani Police 08/30/2011, 10:49 AM [phone]

## 2011-08-31 ENCOUNTER — Inpatient Hospital Stay (HOSPITAL_COMMUNITY): Payer: Medicare Other

## 2011-08-31 LAB — CBC
MCHC: 35.8 g/dL (ref 30.0–36.0)
Platelets: 190 10*3/uL (ref 150–400)
RDW: 14.2 % (ref 11.5–15.5)
WBC: 3.8 10*3/uL — ABNORMAL LOW (ref 4.0–10.5)

## 2011-08-31 LAB — BASIC METABOLIC PANEL
BUN: 7 mg/dL (ref 6–23)
Creatinine, Ser: 0.53 mg/dL (ref 0.50–1.10)
GFR calc Af Amer: >90 mL/min (ref >90)
GFR calc non Af Amer: 84 mL/min — ABNORMAL LOW (ref >90)

## 2011-08-31 MED ORDER — POTASSIUM CHLORIDE 10 MEQ/100ML IV SOLN
10.0000 meq | INTRAVENOUS | Status: AC
  Administered 2011-08-31: 10 meq via INTRAVENOUS
  Filled 2011-08-31 (×2): qty 100

## 2011-08-31 MED ORDER — POTASSIUM CHLORIDE 10 MEQ/100ML IV SOLN
10.0000 meq | Freq: Once | INTRAVENOUS | Status: AC
  Administered 2011-08-31: 10 meq via INTRAVENOUS
  Filled 2011-08-31: qty 100

## 2011-08-31 MED ORDER — ENSURE PUDDING PO PUDG
1.0000 | Freq: Two times a day (BID) | ORAL | Status: DC
  Administered 2011-08-31 – 2011-09-03 (×6): 1 via ORAL

## 2011-08-31 NOTE — Procedures (Signed)
Modified Barium Swallow Procedure Note Patient Details  Name: Margaret [name] MRN: [id number] Date of Birth: 11/22/1923  Today's Date: 08/31/2011 Time:  -     Past Medical History:  Past Medical History  Diagnosis Date  . Unspecified sinusitis (chronic)   . Unspecified essential hypertension   . Cerebrovascular disease, unspecified   . Unspecified venous (peripheral) insufficiency   . Pure hypercholesterolemia   . Unspecified disorder of thyroid   . Esophageal reflux   . Unspecified constipation   . Urinary tract infection, site not specified   . Osteoarthrosis, unspecified whether generalized or localized, unspecified site   . Degeneration of cervical intervertebral disc   . Closed fracture of other bone of wrist   . Anxiety state, unspecified   . Idiopathic urticaria   . Anemia, unspecified   . Other B-complex deficiencies   . Herpes zoster without mention of complication    Past Surgical History:  Past Surgical History  Procedure Date  . Tonsillectomy   . Total abdominal hysterectomy w/ bilateral salpingoophorectomy   . Cervical fusion   . Cataract extraction   . Nasal sinus surgery    HPI:  78 year-old female with history of chronic constipation, GERD, hypertension, previous history of CVA, chronic hyponatremia presented to the ER with worsening abdominal pain, nausea, and vomiting. CT abdomen and pelvis did not show any acute problems except for constipation.  Patient states that over the last 2-3 days she was unable to eat well and had at least 2-3 episodes of nausea vomiting.  History of difficulty swallowing; has had at least four esophageal dilatations since 1999, the last in "08.  Cervical fusion around eleven years ago, per daughter.  In the last few months, pt/daughter describe more difficulty with eating, choking, and food lodging substernally.  Dtr. reports pt eats mostly sweet foods, Ensure, ice cream, and other soft items.  Bedside swallow eval ordered to  determine source of dysphagia.     Recommendation/Prognosis  Clinical Impression Dysphagia Diagnosis: Severe pharyngeal phase dysphagia;Severe cervical esophageal phase dysphagia;Suspected primary esophageal dysphagia Clinical impression: Pt presents with a multifactorial dysphagia with severe pharyngeal weakness in all structures with no laryngeal elevation, hyoid excursion of base of tongue propulsion of bolus. Suspect bilateral neuromuscular etiology given observed weakness in CN VII and X. In addition little to no opening of cricopharygeus due to previously stated reduced anterior hyoid excursion as well as cervical fusion hardware impinging upon UES. Severe pyriform residuals present with thin liquids. Silent aspiration occured only with large continuous straw sips. No real transit to esophagus observed with puree bolus. Entire bolus remained in pharynx after 10 minutes. In addition pt appears to have narrowing in distal 1/2 of esophagus with presence of residue from breakfast (solids prior to solid administration during MBS). Pt at high aspiraiton risk with all POs. Pt most functional with lowest risk with small cup sips of thin liquids following esophageal and aspiration precautions. Pt may wish to continue solid PO intake despite risk as pt has not suffered any pna up to this time. With any decline in health, pt likely to feel the impact of this severe dysphagia.   Swallow Evaluation Recommendations  Recommended Consults: Consider GI evaluation;Consider ENT evaluation: MD reports pt to be seen by GI for possible dilation.   Solid Consistency: No solids, see liquids  Liquid Consistency: Thin  Liquid Administration via: Cup;No straw  Medication Administration: Whole meds with liquid  Supervision: Patient able to self feed  Compensations: Slow rate;Small  sips/bites;Multiple dry swallows after each bite/sip  Postural Changes and/or Swallow Maneuvers: Seated upright 90 degrees;Upright 30-60 min  after meal  Follow up Recommendations: Outpatient SLP Prognosis Prognosis for Safe Diet Advancement: Guarded Barriers to Reach Goals: Severity of dysphagia Individuals Consulted Consulted and Agree with Results and Recommendations: Patient;MD  SLP Goals  SLP Swallowing Goals Patient will consume recommended diet without observed clinical signs of aspiration with: Independent assistance Swallow Study Goal #1 - Progress: Progressing toward goal Patient will utilize recommended strategies during swallow to increase swallowing safety with: Moderate cueing Swallow Study Goal #2 - Progress: Progressing toward goal  [facility], [place] CCC-SLP [phone]   Claudine Mouton 08/31/2011, 3:27 PM

## 2011-08-31 NOTE — Progress Notes (Signed)
Subjective: Feel betetr. Multiple BM's between overnight and this am. No abdominal pain.  Objective: Vital signs in last 24 hours: Temp:  [97.3 F (36.3 C)-97.6 F (36.4 C)] 97.6 F (36.4 C) (01/18 1419) Pulse Rate:  [67-77] 67  (01/18 1419) Resp:  [20] 20  (01/18 1419) BP: (161-192)/(65-75) 192/75 mmHg (01/18 1419) SpO2:  [97 %-98 %] 98 % (01/18 1419) Weight change:  Last BM Date: 08/30/11  Intake/Output from previous day: 01/17 0701 - 01/18 0700 In: 770 [P.O.:720; IV Piggyback:50] Out: 204 [Urine:200; Stool:4] Total I/O In: 360 [P.O.:360] Out: -    Physical Exam: General: Alert, awake, oriented x3, in no acute distress. HEENT: No bruits, no goiter. Heart: Regular rate and rhythm, without murmurs, rubs, gallops. Lungs: Clear to auscultation bilaterally. Abdomen: Soft, nontender, nondistended, positive bowel sounds. Extremities: No clubbing cyanosis or edema with positive pedal pulses. Neuro: no focal motor deficit.  Lab Results: Basic Metabolic Panel:  Basename 08/31/11 0512 08/30/11 1135  NA 125* 125*  K 3.2* 3.9  CL 91* 90*  CO2 25 27  GLUCOSE 97 102*  BUN 7 7  CREATININE 0.53 0.51  CALCIUM 8.9 9.0  MG -- --  PHOS -- --   Liver Function Tests:  Basename 08/29/11 1828  AST 26  ALT 13  ALKPHOS 77  BILITOT 0.5  PROT 7.0  ALBUMIN 3.8    Basename 08/29/11 1828  LIPASE 47  AMYLASE --   CBC:  Basename 08/31/11 0512 08/30/11 0259 08/29/11 1828  WBC 3.8* 4.6 --  NEUTROABS -- -- 2.2  HGB 9.7* 11.1* --  HCT 27.1* 31.3* --  MCV 84.2 83.9 --  PLT 190 219 --   Thyroid Function Tests:  [facility] 08/29/11 2338  TSH 4.683*  T4TOTAL --  FREET4 --  T3FREE --  THYROIDAB --    Misc. Labs:  Recent Results (from the past 240 hour(s))  URINE CULTURE     Status: Normal   Collection Time   08/29/11  6:45 PM      Component Value Range Status Comment   Specimen Description URINE, CLEAN CATCH   Final    Special Requests NONE   Final    Setup Time  [id number]   Final    Colony Count >=100,000 COLONIES/ML   Final    Culture ESCHERICHIA COLI   Final    Report Status 08/31/2011 FINAL   Final    Organism ID, Bacteria ESCHERICHIA COLI   Final     Studies/Results: Dg Abd 1 View  08/31/2011  *RADIOLOGY REPORT*  Clinical Data: Follow up impaction  ABDOMEN - 1 VIEW  Comparison: 08/29/2011  Findings: There has been interval resolution of large desiccated stool ball within the rectum.  Air-filled loops of nondilated colon noted.  There is advanced calcified atherosclerotic disease affecting the abdominal aorta.  IMPRESSION:  1.  Resolution of stool impaction within the rectum.  Original Report Authenticated By: Rosealee Albee, M.D.   Dg Abd Acute W/chest  08/29/2011  *RADIOLOGY REPORT*  Clinical Data: Constipation.  History of proctitis.  ACUTE ABDOMEN SERIES (ABDOMEN 2 VIEW & CHEST 1 VIEW)  Comparison: CT abdomen and pelvis 06/09/2010.  Acute abdomen series 06/08/2010.  Findings: COPD with hyperinflation.  Clear lung fields.  No effusion or pneumothorax.  Normal heart size.  Calcified tortuous aorta.  No free air.  Previous cervical fusion.  Fecal impaction. Vascular calcification.  Degenerative disc disease L3-L4.  Hip DJD. No bowel obstruction or significant air fluid levels.  IMPRESSION: COPD.  Fecal impaction.  No definite active process.  Original Report Authenticated By: Elsie Stain, M.D.   Dg Swallowing Func-no Report  08/31/2011  CLINICAL DATA: dysphagia   FLUOROSCOPY FOR SWALLOWING FUNCTION STUDY:  Fluoroscopy was provided for swallowing function study, which was  administered by a speech pathologist.  Final results and recommendations  from this study are contained within the speech pathology report.      Medications: Scheduled Meds:   . aspirin  81 mg Oral Daily  . cefTRIAXone (ROCEPHIN)  IV  1 g Intravenous Q24H  . docusate sodium  100 mg Oral BID  . levothyroxine  75 mcg Oral Daily  . losartan  50 mg Oral Daily  .  pantoprazole  40 mg Oral Daily  . pindolol  5 mg Oral BID  . pneumococcal 23 valent vaccine  0.5 mL Intramuscular Tomorrow-1000  . potassium chloride  10 mEq Intravenous Q1 Hr x 2   Continuous Infusions:  PRN Meds:.acetaminophen, acetaminophen, hydrALAZINE, ondansetron (ZOFRAN) IV, ondansetron  Assessment/Plan: #1. Fecal impaction causing Abdominal pain - Resolved after use of laxatives and disimpaction. Will continue miralax and colace.   #2. Hyponatremia - Chronic. Due to poor PO intake. At this point improved with IVf's. Will continue monitoring.  #3. UTI - Will continue rocephin X 1 more day. Patient w/o dysuria.   #4. History of chronic dysphagia requiring dilation. Swallowing study demonstrating severe dysphagia; multiple levels and including neurologic problems to coordinate swallowing, mechanical stricture and also esophageal stricture. Will change diet to full liquid and will discuss with daughter and patient next desired step in management.Really high risk for aspiration.  #5. History of hypertension and hypothyroidism - continue present medications.   #6. Hypokalemia: Will replete.  #7. Poor nutrition: will start ensure.  #8. Disposition: Home with Assistance or Possibly ALF. SW and CM have been consulted.   LOS: 2 days   Kerryn Tennant [facility] [phone]  08/31/2011, 6:06 PM

## 2011-08-31 NOTE — Progress Notes (Signed)
Physical Therapy Evaluation Patient Details Name: Margaret [name] MRN: [id number] DOB: 1924-05-24 Today's Date: 08/31/2011  Problem List:  Patient Active Problem List  Diagnoses  . SHINGLES  . Unspecified disorder of thyroid  . VITAMIN B12 DEFICIENCY  . HYPERCHOLESTEROLEMIA  . ANEMIA  . ANXIETY  . HYPERTENSION  . CEREBROVASCULAR DISEASE  . VENOUS INSUFFICIENCY  . SINUSITIS, CHRONIC  . CHRONIC OBSTRUCTIVE ASTHMA UNSPECIFIED  . GERD  . CONSTIPATION  . UTI  . IDIOPATHIC URTICARIA  . DEGENERATIVE JOINT DISEASE  . DEGENERATIVE DISC DISEASE, CERVICAL SPINE  . CLOSED FRACTURE OF OTHER BONE OF WRIST  . Pre-syncope  . Abdominal pain  . Hypothyroidism  . UTI (lower urinary tract infection)  . Hyponatremia    Past Medical History:  Past Medical History  Diagnosis Date  . Unspecified sinusitis (chronic)   . Unspecified essential hypertension   . Cerebrovascular disease, unspecified   . Unspecified venous (peripheral) insufficiency   . Pure hypercholesterolemia   . Unspecified disorder of thyroid   . Esophageal reflux   . Unspecified constipation   . Urinary tract infection, site not specified   . Osteoarthrosis, unspecified whether generalized or localized, unspecified site   . Degeneration of cervical intervertebral disc   . Closed fracture of other bone of wrist   . Anxiety state, unspecified   . Idiopathic urticaria   . Anemia, unspecified   . Other B-complex deficiencies   . Herpes zoster without mention of complication    Past Surgical History:  Past Surgical History  Procedure Date  . Tonsillectomy   . Total abdominal hysterectomy w/ bilateral salpingoophorectomy   . Cervical fusion   . Cataract extraction   . Nasal sinus surgery     PT Assessment/Plan/Recommendation PT Assessment Clinical Impression Statement: Pt is an 78 y/o female admitted for abdominal pain.  Pt presents with mild gait abnormality which could place pt at risk for fallling.  PT  Recommendation/Assessment: All further PT needs can be met in the next venue of care PT Problem List: Decreased safety awareness;Decreased balance PT Therapy Diagnosis : Abnormality of gait PT Recommendation Follow Up Recommendations: Home health PT Equipment Recommended: None recommended by PT PT Goals  Acute Rehab PT Goals PT Goal Formulation: With patient  PT Evaluation Precautions/Restrictions    Prior Functioning  Home Living Lives With: Alone Receives Help From: Family;Friend(s) Type of Home: House Home Layout: One level Home Access: Stairs to enter Entrance Stairs-Rails: Can reach both;Right;Left Entrance Stairs-Number of Steps: 3 Prior Function Level of Independence: Independent with basic ADLs;Independent with gait;Independent with transfers Able to Take Stairs?: Yes Driving: Yes Vocation: Retired Leisure: Hobbies-no Cognition Cognition Arousal/Alertness: Awake/alert Overall Cognitive Status: Appears within functional limits for tasks assessed Sensation/Coordination Sensation Light Touch: Not tested Stereognosis: Not tested Hot/Cold: Not tested Proprioception: Not tested Coordination Gross Motor Movements are Fluid and Coordinated: Yes Fine Motor Movements are Fluid and Coordinated: Yes Extremity Assessment RUE Assessment RUE Assessment: Within Functional Limits LUE Assessment LUE Assessment: Within Functional Limits RLE Assessment RLE Assessment: Within Functional Limits LLE Assessment LLE Assessment: Within Functional Limits Mobility (including Balance) Bed Mobility Bed Mobility: Yes Supine to Sit: 7: Independent;HOB flat Sit to Supine: 7: Independent;HOB flat Transfers Transfers: Yes Sit to Stand: 7: Independent;From bed;From chair/3-in-1;With upper extremity assist;With armrests Stand to Sit: 5: Supervision;To chair/3-in-1;With upper extremity assist Stand to Sit Details: Pt flopped into the chair uncontrolled descent.  Verbal and visual cues for  pt to sit on front end of the chair to allow  pt to control her descent .   Ambulation/Gait Ambulation/Gait: Yes Ambulation/Gait Assistance: 5: Supervision Ambulation/Gait Assistance Details (indicate cue type and reason): Pt deomonstrated independence through most of the task, Hower pt had several lob episodes.  Pt was able to recover balance independently each time.   Ambulation Distance (Feet): 150 Feet Assistive device: None Gait Pattern: Antalgic Gait velocity: WFL Stairs: No Wheelchair Mobility Wheelchair Mobility: No  Posture/Postural Control Posture/Postural Control: No significant limitations Balance Balance Assessed: Yes Static Sitting Balance Static Sitting - Balance Support: No upper extremity supported;Feet supported Static Sitting - Level of Assistance: 7: Independent Static Standing Balance Static Standing - Balance Support: No upper extremity supported Static Standing - Level of Assistance: 5: Stand by assistance Exercise    End of Session PT - End of Session Equipment Utilized During Treatment: Gait belt Activity Tolerance: Patient tolerated treatment well Patient left: in chair;with call bell in reach Nurse Communication: Mobility status for transfers;Mobility status for ambulation General Behavior During Session: [facility] for tasks performed Cognition: [facility] for tasks performed  Faustino Luecke 08/31/2011, 12:10 PM [facility] L. Aubrina Nieman DPT [phone]

## 2011-08-31 NOTE — Progress Notes (Signed)
PT DISCHARGE NOTE:  PT evaluation completed.  Pt is performing at/near baseline level of function and presents with no further acute PT needs.   Acute PTsigning off.  Discussed plan with pt/family and they agree. Pt will be followed by HHPT.   [name] L. Rhylin Venters DPT [phone]  08/31/2011

## 2011-08-31 NOTE — Progress Notes (Signed)
CARE MANAGEMENT NOTE 08/31/2011  Patient:  Margaret [name], Margaret [name]   Account Number:  [id number]  Date Initiated:  08/30/2011  Documentation initiated by:  Junius Creamer  Subjective/Objective Assessment:   adm w abd pain     Action/Plan:   lives alone, pcp dr [name]   Anticipated DC Date:  09/01/2011   Anticipated DC Plan:  HOME W HOME HEALTH SERVICES      DC Planning Services  CM consult      [facility] Choice  HOME HEALTH   Choice offered to / List presented to:  C-1 Patient        HH arranged  HH-1 RN  HH-2 PT  HH-5 SPEECH THERAPY  HH-4 NURSE'S AIDE      HH agency  [facility]   Status of service:   Medicare Important Message given?   (If response is "NO", the following Medicare IM given date fields will be blank) Date Medicare IM given:   Date Additional Medicare IM given:    Discharge Disposition:  HOME W HOME HEALTH SERVICES  Per UR Regulation:  Reviewed for med. necessity/level of care/duration of stay  Comments:  1/18 spoke w pt and Margaret, they would like hhc, peter phy ther rec few cks for some imbalance, problems w swallow, [facility]na at [facility], copy of [facility] agency list on chart, Margaret has rec [facility] and knows aid that works there. gave pt inform on [facility] also. debbie Deavion Strider rn,bsn [phone] 1/17  spoke w pt, she wants to go home and refuses nsg home, await pt/sp ther eval, lives alone w Margaret [name] [phone] ck on her. pt gave me permission to speak w Margaret. explained medicare hhc and Margaret and pt interested in hhc for rn-pt-sp ther-sw-bathaid if needed. will await phy ther rec.debbie Khaleef Ruby rn,bsn [phone] HOME HEALTH AGENCIES SERVING [facility]   Agencies that are Medicare-Certified and are affiliated with [facility]  Telephone Number Address  [facility]   The [facility] has ownership interest in this company; however, you are under no obligation to use this agency. [phone] or    [phone] [address] [place], [place] [address]   Agencies that are Medicare-Certified and are not affiliated with [facility] Agency Telephone Number Address  [facility] [phone] Fax [phone] [address], [address] [place], [place]  [address]  [facility] [phone] or [phone] Fax [phone] [address] [place], [place] [address]  [facility] [phone] Fax [phone] [address] [place], [place] [address]  [facility] [phone] Fax [phone] [address] [place], [place]  [address]  [facility] [phone] Fax [phone] [address] [place], [place] [address]  [facility] [phone] [address] [place], [place] [address]  [facility] [phone]  [address]  [place], [place] [address]  [facility] [phone] or [phone] Fax [phone] [address] [place], [place]  [address]  [facility] [phone] Fax [phone] [address] [place], [place]  [address]  [facility]  [phone] Fax [phone] [address] [place], [place] [address]

## 2011-08-31 NOTE — Progress Notes (Signed)
Occupational Therapy Evaluation Patient Details Name: Margaret [name] MRN: [id number] DOB: October 15, 1923 Today's Date: 08/31/2011  Problem List:  Patient Active Problem List  Diagnoses  . SHINGLES  . Unspecified disorder of thyroid  . VITAMIN B12 DEFICIENCY  . HYPERCHOLESTEROLEMIA  . ANEMIA  . ANXIETY  . HYPERTENSION  . CEREBROVASCULAR DISEASE  . VENOUS INSUFFICIENCY  . SINUSITIS, CHRONIC  . CHRONIC OBSTRUCTIVE ASTHMA UNSPECIFIED  . GERD  . CONSTIPATION  . UTI  . IDIOPATHIC URTICARIA  . DEGENERATIVE JOINT DISEASE  . DEGENERATIVE DISC DISEASE, CERVICAL SPINE  . CLOSED FRACTURE OF OTHER BONE OF WRIST  . Pre-syncope  . Abdominal pain  . Hypothyroidism  . UTI (lower urinary tract infection)  . Hyponatremia    Past Medical History:  Past Medical History  Diagnosis Date  . Unspecified sinusitis (chronic)   . Unspecified essential hypertension   . Cerebrovascular disease, unspecified   . Unspecified venous (peripheral) insufficiency   . Pure hypercholesterolemia   . Unspecified disorder of thyroid   . Esophageal reflux   . Unspecified constipation   . Urinary tract infection, site not specified   . Osteoarthrosis, unspecified whether generalized or localized, unspecified site   . Degeneration of cervical intervertebral disc   . Closed fracture of other bone of wrist   . Anxiety state, unspecified   . Idiopathic urticaria   . Anemia, unspecified   . Other B-complex deficiencies   . Herpes zoster without mention of complication    Past Surgical History:  Past Surgical History  Procedure Date  . Tonsillectomy   . Total abdominal hysterectomy w/ bilateral salpingoophorectomy   . Cervical fusion   . Cataract extraction   . Nasal sinus surgery     OT Assessment/Plan/Recommendation OT Assessment Clinical Impression Statement: Pt. presents with abdominal pain and with generalized weakness. All education completed with pt. and pt will have supervision at D/C. No further  OT needs noted. OT Recommendation/Assessment: Patient does not need any further OT services OT Recommendation Follow Up Recommendations: No OT follow up Equipment Recommended: None recommended by OT OT Goals  None needed  OT Evaluation Precautions/Restrictions  Restrictions Weight Bearing Restrictions: No Prior Functioning Home Living Lives With: Alone Receives Help From: Family;Friend(s) Type of Home: House Home Layout: One level Home Access: Stairs to enter Entrance Stairs-Rails: Can reach both;Right;Left Entrance Stairs-Number of Steps: 3 Bathroom Shower/Tub: Forensic scientist: Standard Bathroom Accessibility: Yes How Accessible: Accessible via walker Home Adaptive Equipment: Grab bars in shower Prior Function Level of Independence: Independent with basic ADLs;Independent with gait;Independent with transfers Able to Take Stairs?: Yes Driving: Yes Vocation: Retired Leisure: Hobbies-no ADL ADL Eating/Feeding: Independent Where Assessed - Eating/Feeding: Chair Grooming: Performed;Supervision/safety;Set up Where Assessed - Grooming: Standing at sink Upper Body Bathing: Simulated;Chest;Right arm;Left arm;Abdomen;Set up;Supervision/safety Where Assessed - Upper Body Bathing: Standing at sink Lower Body Bathing: Simulated;Set up;Supervision/safety Where Assessed - Lower Body Bathing: Sitting at sink Upper Body Dressing: Performed;Set up;Supervision/safety Upper Body Dressing Details (indicate cue type and reason): Doffing gown Where Assessed - Upper Body Dressing: Sitting, bed Lower Body Dressing: Simulated;Set up;Supervision/safety Where Assessed - Lower Body Dressing: Sit to stand from bed Toilet Transfer: Performed;Supervision/safety Toilet Transfer Method: Ambulating Toilet Transfer Equipment: Regular height toilet Toileting - Clothing Manipulation: Performed;Modified independent Where Assessed - Toileting Clothing Manipulation: Sit on 3-in-1  or toilet Toileting - Hygiene: Performed Where Assessed - Toileting Hygiene: Sit on 3-in-1 or toilet Tub/Shower Transfer: Not assessed Tub/Shower Transfer Method: Not assessed Ambulation Related to ADLs: Pt. supervision  with ambulation ~10' in room ADL Comments: Pt. educated on safety with ADLs to increase independence at home due to pt. with generalized weakness and minor balance deficit during functional task resulting in need for close supervision. Family will be present to provide supervision at D/C home. Cognition Cognition Arousal/Alertness: Awake/alert Overall Cognitive Status: Appears within functional limits for tasks assessed Sensation/Coordination Sensation Light Touch: Not tested Stereognosis: Not tested Hot/Cold: Not tested Proprioception: Not tested Coordination Gross Motor Movements are Fluid and Coordinated: Yes Fine Motor Movements are Fluid and Coordinated: Yes Extremity Assessment RUE Assessment RUE Assessment: Within Functional Limits LUE Assessment LUE Assessment: Within Functional Limits Mobility  Bed Mobility Bed Mobility: Yes Supine to Sit: 7: Independent;HOB flat Sit to Supine: 7: Independent;HOB flat Transfers Transfers: Yes Sit to Stand: 7: Independent;From bed;From chair/3-in-1;With upper extremity assist;With armrests Stand to Sit: 5: Supervision;To chair/3-in-1;With upper extremity assist Stand to Sit Details: Pt flopped into the chair uncontrolled descent.  Verbal and visual cues for pt to sit on front end of the chair to allow pt to control her descent .      End of Session OT - End of Session Equipment Utilized During Treatment: Gait belt Activity Tolerance: Patient tolerated treatment well Patient left: in bed;with call bell in reach Nurse Communication: Mobility status for transfers General Behavior During Session: [facility] for tasks performed Cognition: [facility] for tasks performed   Hajer Dwyer, OTR/L Pager [phone] 08/31/2011, 2:58 PM

## 2011-09-01 LAB — BASIC METABOLIC PANEL
Chloride: 92 mEq/L — ABNORMAL LOW (ref 96–112)
GFR calc Af Amer: >90 mL/min (ref >90)
GFR calc non Af Amer: 86 mL/min — ABNORMAL LOW (ref >90)
Potassium: 3.4 mEq/L — ABNORMAL LOW (ref 3.5–5.1)
Sodium: 130 mEq/L — ABNORMAL LOW (ref 135–145)

## 2011-09-01 MED ORDER — POTASSIUM CHLORIDE 10 MEQ/100ML IV SOLN
10.0000 meq | INTRAVENOUS | Status: AC
  Administered 2011-09-01 (×2): 10 meq via INTRAVENOUS
  Filled 2011-09-01 (×2): qty 100

## 2011-09-01 NOTE — Consult Note (Signed)
[facility] Consult Note  Date: 09/01/2011  Patient name: Margaret [name] Medical record number: [id number] Date of birth: Mar 19, 1924 Age: 78 y.o. Gender: female   Chief Complaint: Dysphagia  History of Present Illness: Margaret [name] is an 78 y.o. female history of chronic dysphagia requiring dilation. Swallowing study demonstrated severe dysphagia at multiple levels, thus neuro consult requested for eval of neuro etiology.  Patient has trouble swallowing bread and meats.  Denies trouble with liquids.  These sx are not new or worsened.  Rare slurring of speech, not recently.  No extremity weakness.    Meds Inpatient  Scheduled:   . aspirin  81 mg Oral Daily  . docusate sodium  100 mg Oral BID  . feeding supplement  1 Container Oral BID WC  . levothyroxine  75 mcg Oral Daily  . losartan  50 mg Oral Daily  . pantoprazole  40 mg Oral Daily  . pindolol  5 mg Oral BID  . potassium chloride  10 mEq Intravenous Q1 Hr x 2  . potassium chloride  10 mEq Intravenous Once  . potassium chloride  10 mEq Intravenous Q1 Hr x 2    Prior to Admission medications   Medication Sig Start Date End Date Taking? Authorizing Provider  aspirin 81 MG tablet Take 81 mg by mouth daily.     Yes Historical Provider, MD  b complex vitamins tablet Take 1 tablet by mouth daily.     Yes Historical Provider, MD  chlorpheniramine-HYDROcodone (TUSSIONEX) 10-8 MG/5ML LQCR Take 5 mLs by mouth every 12 (twelve) hours. Take 1 tsp every 12 hrs as needed for cough 07/31/11  Yes Michele Mcalpine, MD  Cholecalciferol (VITAMIN D) 1000 UNITS capsule Take 1,000 Units by mouth daily.     Yes Historical Provider, MD  guaiFENesin (MUCINEX) 600 MG 12 hr tablet Take 1,200 mg by mouth 2 (two) times daily.     Yes Historical Provider, MD  lansoprazole (PREVACID) 30 MG capsule Take 1 tablet by mouth two times a day    Yes Historical Provider, MD  levothyroxine (SYNTHROID) 75 MCG tablet Take 1 tablet (75 mcg total) by mouth  daily. 11/10/10 11/10/11 Yes Michele Mcalpine, MD  losartan (COZAAR) 50 MG tablet TAKE 1 TABLET BY MOUTH DAILY 07/02/11 07/01/12 Yes Michele Mcalpine, MD  Multiple Vitamin (MULTIVITAMINS PO) Take by mouth.     Yes Historical Provider, MD  pindolol (VISKEN) 5 MG tablet Take 1 tablet (5 mg total) by mouth 2 (two) times daily. 05/28/11 05/27/12 Yes Pricilla Riffle, MD  polyethylene glycol [facility] / GLYCOLAX) packet 1 capful in water once or twice daily    Yes Historical Provider, MD  potassium chloride SA (K-DUR,KLOR-CON) 20 MEQ tablet TAKE 1 TABLET BY MOUTH EVERY DAY WITH FOOD 08/14/11  Yes Michele Mcalpine, MD     Allergies: Review of patient's allergies indicates no known allergies.  Past Medical History  Diagnosis Date  . Unspecified sinusitis (chronic)   . Unspecified essential hypertension   . Cerebrovascular disease, unspecified   . Unspecified venous (peripheral) insufficiency   . Pure hypercholesterolemia   . Unspecified disorder of thyroid   . Esophageal reflux   . Unspecified constipation   . Urinary tract infection, site not specified   . Osteoarthrosis, unspecified whether generalized or localized, unspecified site   . Degeneration of cervical intervertebral disc   . Closed fracture of other bone of wrist   . Anxiety state, unspecified   . Idiopathic urticaria   .  Anemia, unspecified   . Other B-complex deficiencies   . Herpes zoster without mention of complication    Past Surgical History  Procedure Date  . Tonsillectomy   . Total abdominal hysterectomy w/ bilateral salpingoophorectomy   . Cervical fusion   . Cataract extraction   . Nasal sinus surgery    Family History  Problem Relation Age of Onset  . Diabetes Father   . Heart failure Mother   . Diabetes Sister    Social History: Reports that she has never smoked. She has never used smokeless tobacco. She reports that she does not drink alcohol or use illicit drugs.  Review of Systems: see HPI.  No HA or dizziness.  No  syncope or presyncope.  N/V prior to admit. Chronic constipation.  Chronic dysphasia.  Occ slurring per dtr- had an episode over Christmas eve.  Occ mild cognitive impairment.   Examination:  Blood pressure 161/66, pulse 64, temperature 98.3 F,Oral, resp. rate 16, height 5\' 6"  , weight 102 lb 1.2 , SpO2 99.00%.  Mental status:   The patient is oriented to person, place and time. Recent and remote memory are intact. Attention span and concentration are normal. Language including repetition, naming, following commands are intact. Fund of knowledge of current and historical events, as well as vocabulary are normal. No aphasia or dysarthria.  Cranial Nerves: Pupils are equally round and reactive to light. Visual fields full to confrontation. Extraocular movements are intact without nystagmus. Weak control to keep eyes closed against resistance.   Facial sensation intact. Muscles of facial expression are symmetric. Hearing intact to bilateral finger rub. Tongue protrusion, uvula, palate midline.  Shoulder shrug intact  Motor: Strength 5/5 bilaterally.   The patient has normal bulk and tone of all extremities, no pronator drift.  There are no adventitious movements.   Reflexes:   Biceps  Triceps Brachioradialis Knee Ankle  Right 1+  1+  1+   0+ 0+  Left  1+  1+  1+   0+ 0+  Plantars: mute  Coordination:  Normal finger to nose, heel to shin.  Rapid alternating movements intact.  Sensation: intact to light touch and pinprick throughout. .   Labs: Results for orders placed during the hospital encounter of 08/29/11 (from the past 48 hour(s))  BASIC METABOLIC PANEL     Status: Abnormal   Collection Time   08/31/11  5:12 AM      Component Value Range Comment   Sodium 125 (*) 135 - 145 (mEq/L)    Potassium 3.2 (*) 3.5 - 5.1 (mEq/L)    Chloride 91 (*) 96 - 112 (mEq/L)    CO2 25  19 - 32 (mEq/L)    Glucose, Bld 97  70 - 99 (mg/dL)    BUN 7  6 - 23 (mg/dL)    Creatinine, Ser 1.61  0.50 - 1.10  (mg/dL)    Calcium 8.9  8.4 - 10.5 (mg/dL)    GFR calc non Af Amer 84 (*) >90 (mL/min)    GFR calc Af Amer >90  >90 (mL/min)   CBC     Status: Abnormal   Collection Time   08/31/11  5:12 AM      Component Value Range Comment   WBC 3.8 (*) 4.0 - 10.5 (K/uL)    RBC 3.22 (*) 3.87 - 5.11 (MIL/uL)    Hemoglobin 9.7 (*) 12.0 - 15.0 (g/dL)    HCT 09.6 (*) 04.5 - 46.0 (%)    MCV 84.2  78.0 -  100.0 (fL)    MCH 30.1  26.0 - 34.0 (pg)    MCHC 35.8  30.0 - 36.0 (g/dL)    RDW 56.2  13.0 - 86.5 (%)    Platelets 190  150 - 400 (K/uL)   BASIC METABOLIC PANEL     Status: Abnormal   Collection Time   09/01/11  5:00 AM      Component Value Range Comment   Sodium 130 (*) 135 - 145 (mEq/L)    Potassium 3.4 (*) 3.5 - 5.1 (mEq/L)    Chloride 92 (*) 96 - 112 (mEq/L)    CO2 28  19 - 32 (mEq/L)    Glucose, Bld 95  70 - 99 (mg/dL)    BUN 6  6 - 23 (mg/dL)    Creatinine, Ser 7.84 (*) 0.50 - 1.10 (mg/dL)    Calcium 9.5  8.4 - 10.5 (mg/dL)    GFR calc non Af Amer 86 (*) >90 (mL/min)    GFR calc Af Amer >90  >90 (mL/min)    Micro Recent Results (from the past 240 hour(s))  URINE CULTURE     Status: Normal   Collection Time   08/29/11  6:45 PM      Component Value Range Status Comment   Specimen Description URINE, CLEAN CATCH   Final    Special Requests NONE   Final    Setup Time [id number]   Final    Colony Count >=100,000 COLONIES/ML   Final    Culture ESCHERICHIA COLI   Final    Report Status 08/31/2011 FINAL   Final    Organism ID, Bacteria ESCHERICHIA COLI   Final     Imaging:  08/31/2011    FLUOROSCOPY FOR SWALLOWING FUNCTION STUDY:  Per ST=    Pt presents with a multifactorial dysphagia with severe pharyngeal weakness in all structures with no laryngeal elevation, hyoid excursion of base of tongue propulsion of bolus. Suspect bilateral neuromuscular etiology given observed weakness in CN VII and X. In addition little to no opening of cricopharygeus due to previously stated reduced anterior  hyoid excursion as well as cervical fusion hardware impinging upon UES. Severe pyriform residuals present with thin liquids. Silent aspiration occured only with large continuous straw sips. No real transit to esophagus observed with puree bolus. Entire bolus remained in pharynx after 10 minutes. In addition pt appears to have narrowing in distal 1/2 of esophagus with presence of residue from breakfast (solids prior to solid administration during MBS). Pt at high aspiraiton risk with all POs. Pt most functional with lowest risk with small cup sips of thin liquids following esophageal and aspiration precautions. Pt may wish to continue solid PO intake despite risk as pt has not suffered any pna up to this time. With any decline in health, pt likely to feel the impact of this severe dysphagia.    Assessment Ms. Margaret [name] is a 78 y.o. female with a long standing history of dysphagia requiring esophogral dilations in the past.  Last was 11/11.  Had some evidence of Barrett's esophagus and ulceration on past EDG's.  Will be having repeat EGD by Dr. Ewing Schlein.  Today, neuro exam was unremarkable, no focal neurologic deficits identified.  It would be exceedingly rare to have a primary neuro-mediated dysphagia in the absence of other neurologic deficits.  Will order MRI for completion.  If negative, will not require further neurologic work up.  Recommendations:  1. MRI Head w/o contrast  Thank you for allowing [place]  to assist in the management with this patient.  Case discussed with Dr. Lyman Speller.   LOS: 3 days   Marya Fossa PA-C [facility] [phone] 09/01/2011  2:54 PM

## 2011-09-01 NOTE — Progress Notes (Signed)
Subjective: Feeling better, having regular bowel movements. No further abdominal pain.  Objective: Vital signs in last 24 hours: Temp:  [97.4 F (36.3 C)-98.3 F (36.8 C)] 98.3 F (36.8 C) (01/19 0432) Pulse Rate:  [64-67] 64  (01/19 0432) Resp:  [16-20] 16  (01/19 0432) BP: (161-192)/(66-75) 161/66 mmHg (01/19 0432) SpO2:  [98 %-99 %] 99 % (01/19 0432) Weight change:  Last BM Date: 08/31/11  Intake/Output from previous day: 01/18 0701 - 01/19 0700 In: 1090 [P.O.:1040; IV Piggyback:50] Out: -      Physical Exam: General: Alert, awake, oriented x3, in no acute distress. HEENT: No bruits, no goiter. Heart: Regular rate and rhythm, without murmurs, rubs, gallops. Lungs: Clear to auscultation bilaterally. Abdomen: Soft, nontender, nondistended, positive bowel sounds. Extremities: No clubbing cyanosis or edema with positive pedal pulses. Neuro: Mild difficulty speaking, height of heat in. No new specific motor deficit. Left facial asymmetry appreciated.  Lab Results: Basic Metabolic Panel:  Basename 09/01/11 0500 08/31/11 0512  NA 130* 125*  K 3.4* 3.2*  CL 92* 91*  CO2 28 25  GLUCOSE 95 97  BUN 6 7  CREATININE 0.48* 0.53  CALCIUM 9.5 8.9  MG -- --  PHOS -- --   Liver Function Tests:  Basename 08/29/11 1828  AST 26  ALT 13  ALKPHOS 77  BILITOT 0.5  PROT 7.0  ALBUMIN 3.8    Basename 08/29/11 1828  LIPASE 47  AMYLASE --   CBC:  Basename 08/31/11 0512 08/30/11 0259 08/29/11 1828  WBC 3.8* 4.6 --  NEUTROABS -- -- 2.2  HGB 9.7* 11.1* --  HCT 27.1* 31.3* --  MCV 84.2 83.9 --  PLT 190 219 --   Thyroid Function Tests:  [facility] 08/29/11 2338  TSH 4.683*  T4TOTAL --  FREET4 --  T3FREE --  THYROIDAB --    Misc. Labs:  Recent Results (from the past 240 hour(s))  URINE CULTURE     Status: Normal   Collection Time   08/29/11  6:45 PM      Component Value Range Status Comment   Specimen Description URINE, CLEAN CATCH   Final    Special Requests  NONE   Final    Setup Time [id number]   Final    Colony Count >=100,000 COLONIES/ML   Final    Culture ESCHERICHIA COLI   Final    Report Status 08/31/2011 FINAL   Final    Organism ID, Bacteria ESCHERICHIA COLI   Final     Studies/Results: Dg Abd 1 View  08/31/2011  *RADIOLOGY REPORT*  Clinical Data: Follow up impaction  ABDOMEN - 1 VIEW  Comparison: 08/29/2011  Findings: There has been interval resolution of large desiccated stool ball within the rectum.  Air-filled loops of nondilated colon noted.  There is advanced calcified atherosclerotic disease affecting the abdominal aorta.  IMPRESSION:  1.  Resolution of stool impaction within the rectum.  Original Report Authenticated By: Rosealee Albee, M.D.   Dg Swallowing Func-no Report  08/31/2011  CLINICAL DATA: dysphagia   FLUOROSCOPY FOR SWALLOWING FUNCTION STUDY:  Fluoroscopy was provided for swallowing function study, which was  administered by a speech pathologist.  Final results and recommendations  from this study are contained within the speech pathology report.      Medications: Scheduled Meds:    . aspirin  81 mg Oral Daily  . cefTRIAXone (ROCEPHIN)  IV  1 g Intravenous Q24H  . docusate sodium  100 mg Oral BID  . feeding supplement  1 Container Oral BID WC  . levothyroxine  75 mcg Oral Daily  . losartan  50 mg Oral Daily  . pantoprazole  40 mg Oral Daily  . pindolol  5 mg Oral BID  . potassium chloride  10 mEq Intravenous Q1 Hr x 2  . potassium chloride  10 mEq Intravenous Once   Continuous Infusions:  PRN Meds:.acetaminophen, acetaminophen, hydrALAZINE, ondansetron (ZOFRAN) IV, ondansetron  Assessment/Plan: #1. Fecal impaction causing Abdominal pain - Resolved after use of laxatives and disimpaction. Will continue miralax and colace. Continue full liquid.  #2. Hyponatremia - Chronic. Due to poor PO intake. At this point improved with IVf's. 130 this morning on BMET. Stable and at her baseline.  #3. UTI - no  dysuria. Will discontinue Rocephin. Antibiotic treatment completed  #4. History of chronic dysphagia requiring dilation. Swallowing study demonstrating severe dysphagia; multiple levels and including neurologic problems to coordinate swallowing, mechanical stricture and also esophageal stricture. We'll continue full liquid diet. Arrange be sent as an outpatient with Dr. Ewing Schlein for esophageal dilation. 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   #5. History of hypertension and hypothyroidism - continue present medications.   #6. Hypokalemia: Still low; will replete  #7. Poor nutrition: will start ensure.  #8. Disposition: Outpatient esophageal dilatation, follow neurology recommendations for any intubation workup needed. Arrange for home health assistance.    LOS: 3 days   Flora Parks [facility] [phone]  09/01/2011, 1:31 PM

## 2011-09-02 ENCOUNTER — Inpatient Hospital Stay (HOSPITAL_COMMUNITY): Payer: Medicare Other

## 2011-09-02 LAB — BASIC METABOLIC PANEL
Chloride: 94 mEq/L — ABNORMAL LOW (ref 96–112)
Creatinine, Ser: 0.48 mg/dL — ABNORMAL LOW (ref 0.50–1.10)
GFR calc Af Amer: >90 mL/min (ref >90)
Potassium: 4.1 mEq/L (ref 3.5–5.1)
Sodium: 129 mEq/L — ABNORMAL LOW (ref 135–145)

## 2011-09-02 NOTE — Progress Notes (Signed)
Subjective: Feeling better, having regular bowel movements. No further abdominal pain.  Objective: Vital signs in last 24 hours: Temp:  [97.2 F (36.2 C)-98.6 F (37 C)] 98.6 F (37 C) (01/20 1500) Pulse Rate:  [65-72] 70  (01/20 1500) Resp:  [18] 18  (01/20 1500) BP: (150-156)/(69-84) 150/70 mmHg (01/20 1500) SpO2:  [96 %-98 %] 98 % (01/20 1500) Weight change:  Last BM Date: 08/31/11  Intake/Output from previous day: 01/19 0701 - 01/20 0700 In: 200 [IV Piggyback:200] Out: 650 [Urine:650]     Physical Exam: General: Alert, awake, oriented x3, in no acute distress. HEENT: No bruits, no goiter. Heart: Regular rate and rhythm, without murmurs, rubs, gallops. Lungs: Clear to auscultation bilaterally. Abdomen: Soft, nontender, nondistended, positive bowel sounds. Extremities: No clubbing cyanosis or edema with positive pedal pulses. Neuro: Mild difficulty speaking, No new specific motor deficit. Left facial asymmetry appreciated.  Lab Results: Basic Metabolic Panel:  Basename 09/02/11 0730 09/01/11 0500  NA 129* 130*  K 4.1 3.4*  CL 94* 92*  CO2 29 28  GLUCOSE 101* 95  BUN 6 6  CREATININE 0.48* 0.48*  CALCIUM 9.1 9.5  MG -- --  PHOS -- --   Liver Function Tests: No results found for this basename: AST:2,ALT:2,ALKPHOS:2,BILITOT:2,PROT:2,ALBUMIN:2 in the last 72 hours No results found for this basename: LIPASE:2,AMYLASE:2 in the last 72 hours CBC:  Basename 08/31/11 0512  WBC 3.8*  NEUTROABS --  HGB 9.7*  HCT 27.1*  MCV 84.2  PLT 190   Thyroid Function Tests: No results found for this basename: TSH,T4TOTAL,FREET4,T3FREE,THYROIDAB in the last 72 hours  Misc. Labs:  Recent Results (from the past 240 hour(s))  URINE CULTURE     Status: Normal   Collection Time   08/29/11  6:45 PM      Component Value Range Status Comment   Specimen Description URINE, CLEAN CATCH   Final    Special Requests NONE   Final    Setup Time [id number]   Final    Colony Count  >=100,000 COLONIES/ML   Final    Culture ESCHERICHIA COLI   Final    Report Status 08/31/2011 FINAL   Final    Organism ID, Bacteria ESCHERICHIA COLI   Final     Studies/Results: Mr Brain Wo Contrast  09/02/2011  *RADIOLOGY REPORT*  Clinical Data: Difficulty swallowing.  Evaluate for central abnormality.  No neuro deficits on exam.  Anemia. Hypercholesterolemia. Hypertension.  MRI HEAD WITHOUT CONTRAST  Technique:  Multiplanar, multiecho pulse sequences of the brain and surrounding structures were obtained according to standard protocol without intravenous contrast.  Comparison: CT head 11/20/2007.  Findings: There is no evidence for acute infarction, intracranial hemorrhage, mass lesion, hydrocephalus, or extra-axial fluid. Moderate to severe atrophy is present.  Advanced chronic microvascular ischemic change is present in the periventricular and subcortical white matter.  No midline shift.  Intracranial vascular structures are widely patent.  Normal pituitary.  Moderate pannus does not compress the cervicomedullary junction.  Chronic sinus disease affects predominantly the left maxillary and left frontal/ethmoid regions. Bilateral cataract extraction.  With regard the question of difficulty swallowing, no focal abnormalities are seen in the pons or medulla.  There is no intra- axial or extra-axial process involving or compressing the lower brainstem segments.  IMPRESSION: Advanced atrophy and chronic microvascular ischemic change.  No acute intracranial findings.  Chronic sinusitis.  Original Report Authenticated By: Elsie Stain, M.D.    Medications: Scheduled Meds:    . aspirin  81 mg Oral  Daily  . docusate sodium  100 mg Oral BID  . feeding supplement  1 Container Oral BID WC  . levothyroxine  75 mcg Oral Daily  . losartan  50 mg Oral Daily  . pantoprazole  40 mg Oral Daily  . pindolol  5 mg Oral BID  . potassium chloride  10 mEq Intravenous Q1 Hr x 2   Continuous Infusions:  PRN  Meds:.acetaminophen, acetaminophen, hydrALAZINE, ondansetron (ZOFRAN) IV, ondansetron  Assessment/Plan: #1. Fecal impaction causing Abdominal pain - Resolved. Will continue miralax and colace.  #2. Hyponatremia - Stable and at her baseline.  #3. UTI - no dysuria. Treatment completed.  #4. History of chronic dysphagia requiring dilation: Normal MRI of the brain with Just chronic atrophic findings and signs of microvascular disease. At this point will arrange for that patient per Dr. Ewing Schlein as an outpatient. Continue full liquid diet.   #5. History of hypertension and hypothyroidism - continue present medications.   #6. Hypokalemia: Repleted.  #7. Poor nutrition: Continue ensure.  #8. Disposition: Outpatient esophageal dilatation, Arrange for home health assistance. Home tomorrow.    LOS: 4 days   Georgeanne Frankland [facility] [phone]  09/02/2011, 4:35 PM

## 2011-09-03 MED ORDER — ENSURE PUDDING PO PUDG: 1.0000 | Freq: Two times a day (BID) | ORAL | Status: DC

## 2011-09-03 MED ORDER — POLYETHYLENE GLYCOL 3350 17 G PO PACK: 17.0000 g | PACK | Freq: Every day | ORAL | Status: DC

## 2011-09-03 MED ORDER — DSS 100 MG PO CAPS: 100.0000 mg | ORAL_CAPSULE | Freq: Two times a day (BID) | ORAL | Status: AC

## 2011-09-03 NOTE — Progress Notes (Signed)
Margaret [name] to be D/C'd Home per MD order.  Discussed with the patient and all questions fully answered.   Jairy, Angulo  Home Medication Instructions [id number]   Printed on:09/03/11 1511  Medication Information                    aspirin 81 MG tablet Take 81 mg by mouth daily.             guaiFENesin (MUCINEX) 600 MG 12 hr tablet Take 1,200 mg by mouth 2 (two) times daily.             lansoprazole (PREVACID) 30 MG capsule Take 1 tablet by mouth two times a day            Cholecalciferol (VITAMIN D) 1000 UNITS capsule Take 1,000 Units by mouth daily.             Multiple Vitamin (MULTIVITAMINS PO) Take by mouth.             levothyroxine (SYNTHROID) 75 MCG tablet Take 1 tablet (75 mcg total) by mouth daily.           b complex vitamins tablet Take 1 tablet by mouth daily.             pindolol (VISKEN) 5 MG tablet Take 1 tablet (5 mg total) by mouth 2 (two) times daily.           losartan (COZAAR) 50 MG tablet TAKE 1 TABLET BY MOUTH DAILY           potassium chloride SA (K-DUR,KLOR-CON) 20 MEQ tablet TAKE 1 TABLET BY MOUTH EVERY DAY WITH FOOD           feeding supplement (ENSURE) PUDG Take 1 Container by mouth 2 (two) times daily with a meal.           docusate sodium 100 MG CAPS Take 100 mg by mouth 2 (two) times daily.           polyethylene glycol (MIRALAX / GLYCOLAX) packet Take 17 g by mouth daily. Hold for diarrhea             VVS, Skin clean, dry and intact without evidence of skin break down, no evidence of skin tears noted. IV catheter discontinued intact. Site without signs and symptoms of complications. Dressing and pressure applied.  An After Visit Summary was printed and given to the patient. Patient escorted via WC, and D/C home via private auto.  Debbora Presto 09/03/2011 3:11 PM

## 2011-09-03 NOTE — Discharge Summary (Signed)
Patient in stable and improved condition; as stated on d/c summary per Mikal Plane; patient will arrange follow up with Dr. Ewing Schlein for esophageal dilatation as an outpatient. Meanwhile she will follow a full liquid diet. She will also follow with her PCP over the next 2 weeks or so for continuity of care.  Dr. Gwenlyn Perking [phone]

## 2011-09-03 NOTE — Discharge Summary (Signed)
Physician Discharge Summary  Patient ID: ABISAI COBLE MRN: [id number] DOB/AGE: 76/28/1925 76 y.o.  Admit date: 08/29/2011 Discharge date: 09/03/2011  PCP: Alroy Dust, MD GI: Vida Rigger, MD   Discharge Diagnoses:  1. Abdominal pain secondary to fecal impaction 2. Hyponatremia, acute on chronic 3. E.Coli UTI 4. Hypokalemia, resolved 5. Chronic dysphagia requiring dilation in the past  6. Hx hypertension 7. Hx hypothyroidism   Brief Summary: Margaret [name] is a 78 y.o. y/o female with a PMH of chronic constipation, hypertension, chronic hyponatremia, and previous CVA who presented to the ED on date of admission with complaints of abdominal pain. Work-up in the ED showed chronic hyponatremia as well as fecal impaction on abdominal film. Pt was also found to have a UTI. Sodium levels were slightly lower than pt's baseline at 119. Pt reported poor po intake x 2-3 days with 2-3 episodes of nausea and vomiting prior to admission.   Brief hospital course: 1. Abdominal pain secondary to fecal impaction: Manual disimpaction was performed in the ED prior to admission with good results. Pt received a soap suds enema on admission with relief of impaction and, subsequently, abdominal pain. Again, film of the abdomen and pelvis was performed on initial evaluation and was unremarkable aside from impaction. Liver fx tests and lipase wnl on admission. Pt's abdominal pain has resolved as stated above.  2. Hyponatremia: chronic in nature, but exacerbated by poor po intake just prior to this admission in setting of above. Pt received gentle IVF hydration with return on sodium levels to baseline.   3. E.Coli UTI: Pt completed course of Rocephin  4. Chronic dysphagia requiring dilation in the past: Pt underwent swallow eval and modified barium swallow during hospitalization showing severe dysphagia at multiple levels. Due to daughter's concerns, neurology was asked to see pt in consultation. Neuro exam was  unremarkable. However, MRI brain was ordered for completion of work-up. Given MRI was negative for any causative factors of pts dysphagia, no further work-up is planned. Given complexity of pt's history, Dr Ewing Schlein has asked that pt schedule follow-up with him in the office for repeat dilation. Pt has been asked to stay on full liquid diet until follow-up.     Consults:  [facility] for dysphagia  Speech Therapy    Significant Diagnostic Studies:  *RADIOLOGY REPORT*  Clinical Data: Constipation. History of proctitis.  ACUTE ABDOMEN SERIES (ABDOMEN 2 VIEW & CHEST 1 VIEW)  Comparison: CT abdomen and pelvis 06/09/2010. Acute abdomen series  06/08/2010.  Findings: COPD with hyperinflation. Clear lung fields. No  effusion or pneumothorax. Normal heart size. Calcified tortuous  aorta. No free air. Previous cervical fusion. Fecal impaction.  Vascular calcification. Degenerative disc disease L3-L4. Hip DJD.  No bowel obstruction or significant air fluid levels.  IMPRESSION:  COPD. Fecal impaction. No definite active process  *RADIOLOGY REPORT*  Clinical Data: Follow up impaction  ABDOMEN - 1 VIEW  Comparison: 08/29/2011  Findings: There has been interval resolution of large desiccated  stool ball within the rectum.  Air-filled loops of nondilated colon noted.  There is advanced calcified atherosclerotic disease affecting the  abdominal aorta.  IMPRESSION:  1. Resolution of stool impaction within the rectum.   MRI HEAD WITHOUT CONTRAST  Technique: Multiplanar, multiecho pulse sequences of the brain and  surrounding structures were obtained according to standard protocol  without intravenous contrast.  Comparison: CT head 11/20/2007.  Findings: There is no evidence for acute infarction, intracranial  hemorrhage, mass lesion, hydrocephalus, or extra-axial fluid.  Moderate to severe atrophy is present. Advanced chronic  microvascular ischemic change is present in the  periventricular and  subcortical white matter.  No midline shift. Intracranial vascular structures are widely  patent. Normal pituitary. Moderate pannus does not compress the  cervicomedullary junction. Chronic sinus disease affects  predominantly the left maxillary and left frontal/ethmoid regions.  Bilateral cataract extraction.  With regard the question of difficulty swallowing, no focal  abnormalities are seen in the pons or medulla. There is no intra-  axial or extra-axial process involving or compressing the lower  brainstem segments.  IMPRESSION:  Advanced atrophy and chronic microvascular ischemic change. No  acute intracranial findings. Chronic sinusitis.    Discharge Labs  BMET  Lab 09/02/11 0730 09/01/11 0500 08/31/11 0512 08/30/11 1135 08/30/11 0853  NA 129* 130* 125* 125* 129*  K 4.1 3.4* -- -- --  CL 94* 92* 91* 90* 92*  CO2 29 28 25 27 26   GLUCOSE 101* 95 97 102* 97  BUN 6 6 7 7 7   CREATININE 0.48* 0.48* 0.53 0.51 0.42*  CALCIUM 9.1 9.5 8.9 9.0 9.0  MG -- -- -- -- --  PHOS -- -- -- -- --     CBC   Lab 08/31/11 0512 08/30/11 0259 08/29/11 1828  HGB 9.7* 11.1* 10.3*  HCT 27.1* 31.3* 28.2*  WBC 3.8* 4.6 5.0  PLT 190 219 207      Discharge Orders    Future Appointments: Provider: Department: Dept Phone: Center:   09/05/2011 11:00 AM Michele Mcalpine, MD [facility] [phone] None     Future Orders Please Complete By Expires   Discharge instructions      Comments:   Full liquid diet until follow-up with Dr Ewing Schlein.       Follow-up Information    Follow up with [facility] E, MD. Call in 1 week. (call office to schedule dilitation with Dr. Ewing Schlein)    Contact information:   [address] [employer], [place]. [facility] [place] [address] [phone]            Vandy, Fong  Home Medication Instructions [id number]   Printed on:09/03/11 1349  Medication Information                    aspirin 81  MG tablet Take 81 mg by mouth daily.             guaiFENesin (MUCINEX) 600 MG 12 hr tablet Take 1,200 mg by mouth 2 (two) times daily.             lansoprazole (PREVACID) 30 MG capsule Take 1 tablet by mouth two times a day            Cholecalciferol (VITAMIN D) 1000 UNITS capsule Take 1,000 Units by mouth daily.             Multiple Vitamin (MULTIVITAMINS PO) Take by mouth.             levothyroxine (SYNTHROID) 75 MCG tablet Take 1 tablet (75 mcg total) by mouth daily.           b complex vitamins tablet Take 1 tablet by mouth daily.             pindolol (VISKEN) 5 MG tablet Take 1 tablet (5 mg total) by mouth 2 (two) times daily.           losartan (COZAAR) 50 MG tablet TAKE 1 TABLET BY MOUTH DAILY  potassium chloride SA (K-DUR,KLOR-CON) 20 MEQ tablet TAKE 1 TABLET BY MOUTH EVERY DAY WITH FOOD           feeding supplement (ENSURE) PUDG Take 1 Container by mouth 2 (two) times daily with a meal.           docusate sodium 100 MG CAPS Take 100 mg by mouth 2 (two) times daily.           polyethylene glycol (MIRALAX / GLYCOLAX) packet Take 17 g by mouth daily. Hold for diarrhea             Disposition:   Discharged Condition: CLARITZA JULY has met maximum benefit of inpatient care and is medically stable and cleared for discharge.  Patient is pending follow up as above.      Time spent on disposition:  Greater than 35 minutes.   Cordelia Pen, NP-C [facility]  pgr [phone]

## 2011-09-05 ENCOUNTER — Ambulatory Visit (INDEPENDENT_AMBULATORY_CARE_PROVIDER_SITE_OTHER): Payer: Medicare Other | Admitting: Pulmonary Disease

## 2011-09-05 ENCOUNTER — Encounter: Payer: Self-pay | Admitting: Pulmonary Disease

## 2011-09-05 ENCOUNTER — Telehealth: Payer: Self-pay | Admitting: Pulmonary Disease

## 2011-09-05 ENCOUNTER — Other Ambulatory Visit (INDEPENDENT_AMBULATORY_CARE_PROVIDER_SITE_OTHER): Payer: Medicare Other

## 2011-09-05 DIAGNOSIS — M5030 Other cervical disc degeneration, unspecified cervical region: Secondary | ICD-10-CM

## 2011-09-05 DIAGNOSIS — I872 Venous insufficiency (chronic) (peripheral): Secondary | ICD-10-CM

## 2011-09-05 DIAGNOSIS — I679 Cerebrovascular disease, unspecified: Secondary | ICD-10-CM

## 2011-09-05 DIAGNOSIS — K5900 Constipation, unspecified: Secondary | ICD-10-CM

## 2011-09-05 DIAGNOSIS — E079 Disorder of thyroid, unspecified: Secondary | ICD-10-CM

## 2011-09-05 DIAGNOSIS — J329 Chronic sinusitis, unspecified: Secondary | ICD-10-CM

## 2011-09-05 DIAGNOSIS — K219 Gastro-esophageal reflux disease without esophagitis: Secondary | ICD-10-CM

## 2011-09-05 DIAGNOSIS — D649 Anemia, unspecified: Secondary | ICD-10-CM

## 2011-09-05 DIAGNOSIS — J449 Chronic obstructive pulmonary disease, unspecified: Secondary | ICD-10-CM

## 2011-09-05 DIAGNOSIS — M1990 Unspecified osteoarthritis, unspecified site: Secondary | ICD-10-CM

## 2011-09-05 DIAGNOSIS — E7800 Pure hypercholesterolemia, unspecified: Secondary | ICD-10-CM

## 2011-09-05 DIAGNOSIS — I10 Essential (primary) hypertension: Secondary | ICD-10-CM

## 2011-09-05 DIAGNOSIS — F411 Generalized anxiety disorder: Secondary | ICD-10-CM

## 2011-09-05 DIAGNOSIS — E538 Deficiency of other specified B group vitamins: Secondary | ICD-10-CM

## 2011-09-05 LAB — CBC WITH DIFFERENTIAL/PLATELET
Basophils Relative: 0.1 % (ref 0.0–3.0)
Eosinophils Absolute: 0 10*3/uL (ref 0.0–0.7)
HCT: 29.6 % — ABNORMAL LOW (ref 36.0–46.0)
Hemoglobin: 10.3 g/dL — ABNORMAL LOW (ref 12.0–15.0)
Lymphocytes Relative: 54 % — ABNORMAL HIGH (ref 12.0–46.0)
Lymphs Abs: 2.2 10*3/uL (ref 0.7–4.0)
MCHC: 34.6 g/dL (ref 30.0–36.0)
Monocytes Relative: 10.5 % (ref 3.0–12.0)
Neutro Abs: 1.4 10*3/uL (ref 1.4–7.7)
RBC: 3.31 Mil/uL — ABNORMAL LOW (ref 3.87–5.11)

## 2011-09-05 LAB — VITAMIN B12: Vitamin B-12: 635 pg/mL (ref 211–911)

## 2011-09-05 LAB — IBC PANEL: Saturation Ratios: 13.3 % — ABNORMAL LOW (ref 20.0–50.0)

## 2011-09-05 LAB — BASIC METABOLIC PANEL
CO2: 32 mEq/L (ref 19–32)
Calcium: 9.6 mg/dL (ref 8.4–10.5)
Chloride: 89 mEq/L — ABNORMAL LOW (ref 96–112)
Potassium: 4.9 mEq/L (ref 3.5–5.1)
Sodium: 128 mEq/L — ABNORMAL LOW (ref 135–145)

## 2011-09-05 MED ORDER — UNABLE TO FIND: Status: DC

## 2011-09-05 NOTE — Progress Notes (Signed)
Subjective:    Patient ID: Margaret [name], female    DOB: August 01, 1924, 78 y.o.   MRN: [id number]  HPI 78 y/o WF here for a follow up visit... She has Chr Sinusitis;  HBP;  ASPVD;  Chol;  GERD/ Constip w/ hx impaction;  UTIs;  DJD/ DDD;  Hx anxiety;  Hx anemia...   ~  July 17, 2010:  she was hosp 10/27 to 06/10/10 by [facility] w/ constipation & fecal impaction> her GI is DrMagod;  CT Abd showed norm GB, sm bilat renal cysts, prev hyst, rectal wall thickening c/w proctitis;  Labs OK x mild anemia w/ Hg~10, borderline hypothy;  she was disimpacted & re-started on her bowel regimen of MIRALAX & SENAKOT-S...  she is back to baseline but weak, wt down to 110# & asked to incr the supplements to Bid... chjr sinus prob w/o change;  BP controlled & stable on meds;  denies cerebral ischemic symptoms;  see prob list below>>>  ~  November 06, 2010:  32mo ROV w/ review of mult chr problems & FASTING blood work today>  Review shows elev TSH= 9.85 & we will need to start her on Synthroid 59mcg/d...  ~  March 08, 2011:  32mo ROV & she feels as though she is "a little bit better" but still c/o weak & thinks one of her meds is keeping her up at night (doesn't know which one);  She stopped several meds including Mucinex, Losartan, Crestor, KCl, & Fe;  We decided to recheck labs> FLP looks good today ?is this on or off the Cres20 (Pharm says last filled #30 on 11/11/10)==> keep same & bring all med bottles to f/u visit; Chems= norm x Na=128; Hg=11.1, Fe= 87, B12= 418, TSH=0.27  ~  July 09, 2011:  32mo ROV & she brought all of her med bottles to the office today to review>>    Chr sinusitis>  Hx allergies & chr sinus problems w/ polyps & septal perf prev eval by DrShoemaker;  Currently only uses Mucinex & Saline prn...    HBP & pre-syncope>  She saw DrPRoss 10/12 after a spell at [employer] w/ near syncope; she adjusted her meds by stopping Cozaar50 & Amlodipine2.5, & starting PINDOLOL5Bid; Review of meds shows that she never stopped  Losartan or Norvasc, she did fill Visken5 but only taking one/d; BP= 132/70 w/ transient postural drop ==> Rec to stop Amlodipine, continue Cozaar50, incr Pindolol5Bid...     Cerebrovasc dis>  On ASA 81mg /d;  sm vessel dis on prev MRI & faint bilat CBruits w/ hx neg CDopplers in the past...    Chol>  Prev on Simva40 but stopped by the pt 8/11 due to "weak"; we tried to get her on Crestor & so did DrRoss but pt stopped this too (?made her weak)...    GERD>  On Prevacid 30mg /d, known HH & followed by [facility] for GI w/ dilatation 11/11 & swallowing better...    Constip>  Hx severe constip w/ hosp for impaction 2003;  On Miralax daily & doing OK...    DJD>  Can't take NSAIDs well, uses Tylenol & prn Advil;  C/o some left ankle pain on & off...    Anxiety>  Not on meds...    Anemia & B12 defic>  Takes Fe daily & B12 shots monthly;  Labs today showed Hg=12.0, MCV=90...  ~  September 05, 2011:  2mo ROV & post hosp> Adm 08/29/11 w/ adb pain, fecal impaction, worsening dysphagia, UTI, HBP &  chronic hyponatremia==> improved after fluids, laxatives, antibiotics, etc; MBS showed dysphagia & she is on a liq diet==> she has f/u w/ DrMagod soon for EGD & dilatation...  See Prob List below          Problem List:  SINUSITIS, CHRONIC (ICD-473.9) - Long Hx chronic sinus problems - Eval & Rx by DrShoemaker w/ hx epistaxis, nasal septal erosion w/ prev "button";  and nasal polyps... she uses Saline, Mucinex, etc... she notes mild chr cough from the drainage- we reviewed chronic nasal regimen & rec MUCINEX DM 2Bid...  HYPERTENSION (ICD-401.9) - control hampered by freq med changes by her physicians, and her compliance w/ medication directions; She is requested to bring all med bottles to every office visit. ~  Baseline EKG w/ NSR, WNL... ~  2D ECHO 2001 was normal... ~  CXR 11/07 w/ Biapical pl thickening, NAD... CXR 9/09 showed COPD w/ incr markings, NAD... f/u CXR 5/11 showed COPD, NAD...  ~  11/10: c/o weak in AM-  decision to decr Amlodipine to 5mg /d & BP has been stable on this. ~  8/11: Hosp by [facility] w/ "presyncope" & meds were adjusted but she restarted prev Rx on discharge> we decided to stop Diovan/Hct in favor of LOSARTAN 50mg /d (?if she ever even filled it- reminded to bring all med bottles to every visit). ~  7/12: BP today= 120/70, no postural changes, ?on Amlodip5 & Losartan50... she denies HA, visual symptoms, CP, palpit, change in SOB, edema, etc... ~  10/12: Near syncope at [employer]> saw DrRoss w/ attempted meds changes (asked to stop Amlodip/Losartan & start Pindolol5Bid) but she decided to take all 3... ~  11/12: BP today= 132/70 w/ transient postural drop; asked to stop Amlodip, continue LOSARTAN50, take VISKEN5Bid... ~  1/13: BP= 118/72 on same meds & feeling better since disch...  CEREBROVASCULAR DISEASE (ICD-437.9) - on ASA 81mg /d... Prev eval for syncope (related to postural BP changes) w/ MRI showing small vessel dz;  she has bilat CBruits w/ Carotid Dopplers in 2001 showing mild plaque, no signif ICA stenoses... ~  she denies cerebral ischemic symptoms on the ASA 81mg /d...  VENOUS INSUFFICIENCY (ICD-459.81) - on low sodium diet + support hose as needed.  HYPERCHOLESTEROLEMIA (ICD-272.0) - prev on Simva40 but she thinks this made her weak & she stopped it;  Tried Crestor 20mg /d but ?if filling this med regularly or not, asked to bring all bottles to each visit... ~  FLP 12/08 showed TChol 204, TG 93, HDL 67, LDL 106... On Simva40/d. ~  FLP 9/09 showed TChol 183, TG 53, HDL 68, LDL 105... Contin same + diet. ~  FLP 8/10 showed TChol 224, TG 78, HDL 67, LDL 137... rec> take med regularly & work on diet. ~  FLP 5/11 showed TChol 187, TG 80, HDL 67, LDL 104 ~  8/11: she thinks the Simva40 makes her weak & she wants to put it on HOLD. ~  FLP off meds 3/12 showed TChol 293, TG 90, HDL 81, LDL 191... rec to restart Cres20. ~  FLP 7/12 ?on Cres20 showed TChol 163, TG 91, HDL 72, LDL 73 ~  She has  since stopped the Crestor on her own (?making her weak). ~  1/13: Not fasting today for FLP...  UNSPECIFIED DISORDER OF THYROID/ HYPOTHYROIDISM - she has a sl elevated TSH which we were following & finally started SYNTHROID 77mcg/d 3/12... ~  labs 9/09 showed TSH= 7.31 ~  labs 1/10 showed TSH= 5.40 ~  labs 8/10 showed TSH=  5.08... she does c/o being "cold" ~  labs 5/11 showed TSH= 6.11... she notes that her energy is "pretty fair" ~  labs 8/11 showed TSH= 0.73... wt= 116# & stable over the last 29mo. ~  Labs 3/12 showed TSH= 9.85... Rec> start SYNTHROID 51mcg/d. ~  Labs 7/12 on Levo75 showed TSH= 0.27... Keep same for now. ~  Labs 1/13 on Levo75 showed TSH= 4.68  GERD (ICD-530.81) - PPI changed to generic PREVACID 30mg /d... Known HH & GERD w/ EGD from [facility] 8/[facility] & 2/06 showing HH, GERD, erosions, & gastric polyp...  ~  EGD 12/08 w/ savory dilatation... she is encouraged to f/u w/ DrMagod if symptoms occur... ~  f/u EGD w/ dilatation 11/11 by [facility] for dysphagia symptoms showed tort esoph, sm HH, tiny gastric polyps, Savory dil done.  CONSTIPATION (ICD-564.00) - Hx severe constipation w/ fecal impaction req hosp 2/03;  last colon = 2/06 DrMagod w/ hemorrhoids & sm polyps... she uses MIRALAX daily... ~  [facility] 10/11 w/ constip & fecal impaction> disimpacted & started back on MIRALAX & SENAKOT-S... ~  [facility] 1/13 w/ similar problem & we reviewed the importance of taking the Miralax & Senakot every day & monitoring her output w/ contingency meds (Mag citrate & Dulcolax) if needed...  UTI (ICD-599.0) - Hx chronic UTI's Rx by Urology Joneen Boers w/ Macrodantin in the past... ~  8/11: hosp by [facility] w/ UTI- sens EColi treated w/ Rocephin/ then Septra.  DEGENERATIVE JOINT DISEASE (ICD-715.90) - she uses Tylenol/ Advil as needed... ~  5/09:  she fell and broke her right wrist- s/p ORIF 6/09 by DrWeingold  DEGENERATIVE DISC DISEASE, CERVICAL SPINE (ICD-722.4) - CSpine surg by DrJenkins 1998...  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  ANEMIA (ICD-285.9), & VITAMIN B12 DEFICIENCY (ICD-266.2) - Hx mild anemia  ~  labs 6/08 showed Hg= 10.5 ~  labs 12/08 showed Hg= 12.3, Fe= 92 ~  labs 9/09 showed Hg= 11.8, MCV=89 ~  labs 1/10 showed Hg= 12.1, MCV= 89, Fe= 83, B12= 186... started on B12 shots monthly. ~  labs 8/10 showed Hg= 12.2, MCV= 92, Fe= 87, B12= 470... continue Rx. ~  labs 5/11 showed Hg= 11.2, MCV= 88 ~  labs 8/11 in hosp showed Hg= 10-11 range. ~  labs 10/11 in hosp showed Hg= 10-11 range... asked to start Femiron one tab daily. ~  Labs 3/12 showed Hg= 12.0, MCV= 90 ~  Labs 7/12 showed Hg= 11.1, MCV= 89, Fe= 87 (22%sat) ~  Labs 1/13 showed Hg=10.3, MCV=90, Fe=46 (13%sat), B12=635, SPE=neg (no monoclonal spike).  Hx SHINGLES - right T9-10 distrib 6/10 & treated w/ Famvir, Medrol, DCN100...   Past Surgical History  Procedure Date  . Tonsillectomy   . Total abdominal hysterectomy w/ bilateral salpingoophorectomy   . Cervical fusion   . Cataract extraction   . Nasal sinus surgery     Outpatient Encounter Prescriptions as of 09/05/2011  Medication Sig Dispense Refill  . aspirin 81 MG tablet Take 81 mg by mouth daily.        Marland Kitchen b complex vitamins tablet Take 1 tablet by mouth daily.        . Cholecalciferol (VITAMIN D) 1000 UNITS capsule Take 1,000 Units by mouth daily.        Marland Kitchen docusate sodium 100 MG CAPS Take 100 mg by mouth 2 (two) times daily.      . feeding supplement (ENSURE) PUDG  Take 1 Container by mouth 2 (two) times daily with a meal.      . guaiFENesin (MUCINEX) 600 MG 12 hr tablet Take 1,200 mg by mouth 2 (two) times daily.        . lansoprazole (PREVACID) 30 MG capsule Take 1 tablet by mouth two times a day       . levothyroxine (SYNTHROID) 75 MCG tablet Take 1 tablet (75 mcg total) by mouth daily.  30 tablet  11  . losartan (COZAAR) 50 MG tablet TAKE 1 TABLET BY MOUTH DAILY   30 tablet  0  . Multiple Vitamin (MULTIVITAMINS PO) Take by mouth.        . pindolol (VISKEN) 5 MG tablet Take 1 tablet (5 mg total) by mouth 2 (two) times daily.  60 tablet  12  . polyethylene glycol (MIRALAX / GLYCOLAX) packet Take 17 g by mouth daily. Hold for diarrhea  14 each    . potassium chloride SA (K-DUR,KLOR-CON) 20 MEQ tablet TAKE 1 TABLET BY MOUTH EVERY DAY WITH FOOD  30 tablet  4    No Known Allergies   Current Medications, Allergies, Past Medical History, Past Surgical History, Family History, and Social History were reviewed in [employer] record.    Review of Systems        See HPI - all other systems neg except as noted...  The patient complains of weight loss, decreased hearing, dyspnea on exertion, headaches, severe indigestion/heartburn, muscle weakness, and difficulty walking.  The patient denies anorexia, fever, weight gain, vision loss, hoarseness, chest pain, syncope, peripheral edema, prolonged cough, hemoptysis, abdominal pain, melena, hematochezia, hematuria, incontinence, suspicious skin lesions, transient blindness, depression, unusual weight change, abnormal bleeding, enlarged lymph nodes, and angioedema.     Objective:   Physical Exam     WD, WN, 78 y/o WF in NAD... she is chr ill appearing... wt 112# GENERAL:  Alert & oriented; pleasant & cooperative... HEENT:  /AT, EOM-wnl, PERRLA, EACs- sl wax, TMs-wnl, NOSE- nasal septal perf, THROAT- sl red +drainage... NECK:  Supple w/ decrROM; no JVD; normal carotid impulses w/ 1+bruits; no thyromegaly or nodules palpated; no lymphadenopathy. CHEST:  Clear to P & A; without wheezes or rales, but scat rhonchi heard... HEART:  Regular Rhythm;  gr1/6 SEM without rubs or gallops detected... ABDOMEN:  Soft & nontender; normal bowel sounds; no organomegaly or masses palpated... EXT: without deformities, mild arthritic changes; no varicose veins/ +venous insuffic/ no edema. NEURO:  CN's intact;   no focal neuro deficits, she is weak diffusely... DERM: scarring in the right T9-10 distrib of her prev shingles eruption...   Assessment & Plan:   Chronic Sinusitis>  Long hx chr sinus problems and perforated nasal septum; rec to use Mucinex 1-2 Bid + Fluids and nasal saline mist...  HBP>  BP meds adjusted freq but she has been non-compliant; pt asked to bring all med bottles to every visit; we decided upon LOSARTAN 50mg /d & PINDOLOL 5mg Bid==> BP is stable on this Rx.  Cerebrovasc Dis>  She denies any cerebral ischemic symptoms; no postural BP changes; continue ASA daily...  Venous Insuffic>  Reminded to elim sodium, elevate legs, wear support hose...  CHOL>  She stopped the low dose Crestor also rec by [place] (?it made her weak too?); for now she is on diet alone & we will discuss Lipid Clinic.  HYPOTHYROID>  On Synthroid 47mcg/d & labs are better- keep same for now...  GI> GERD, Dysphagia, Constip, recurrent impactions, etc>  rec to take PPI/ Prevacid regularly, and continue Miralax/ Senakot-S etc;reminded to take laxative regimen every day!!! She has f/u appt drMagod for dysphagia==> EGD, dilatation...  DJD, CSpine Disc dis, etc>  Stable on OTC Tylenol & occas Advil...  ANEMIA & B12 Defic>  She remains on Fe one tab daily & B12 shots every month...   Patient's Medications  New Prescriptions   No medications on file  Previous Medications   ASPIRIN 81 MG TABLET    Take 81 mg by mouth daily.     B COMPLEX VITAMINS TABLET    Take 1 tablet by mouth daily.     CHOLECALCIFEROL (VITAMIN D) 1000 UNITS CAPSULE    Take 1,000 Units by mouth daily.     DOCUSATE SODIUM 100 MG CAPS    Take 100 mg by mouth 2 (two) times daily.   FEEDING SUPPLEMENT (ENSURE) PUDG    Take 1 Container by mouth 2 (two) times daily with a meal.   GUAIFENESIN (MUCINEX) 600 MG 12 HR TABLET    Take 1,200 mg by mouth 2 (two) times daily.     LANSOPRAZOLE (PREVACID) 30 MG CAPSULE    Take 1 tablet by mouth two times a day     LEVOTHYROXINE (SYNTHROID) 75 MCG TABLET    Take 1 tablet (75 mcg total) by mouth daily.   LOSARTAN (COZAAR) 50 MG TABLET    TAKE 1 TABLET BY MOUTH DAILY   MULTIPLE VITAMIN (MULTIVITAMINS PO)    Take by mouth.     PINDOLOL (VISKEN) 5 MG TABLET    Take 1 tablet (5 mg total) by mouth 2 (two) times daily.   POLYETHYLENE GLYCOL (MIRALAX / GLYCOLAX) PACKET    Take 17 g by mouth daily. Hold for diarrhea   POTASSIUM CHLORIDE SA (K-DUR,KLOR-CON) 20 MEQ TABLET    TAKE 1 TABLET BY MOUTH EVERY DAY WITH FOOD  Modified Medications   No medications on file  Discontinued Medications   No medications on file

## 2011-09-05 NOTE — Telephone Encounter (Signed)
Called and spoke with pts daughter Malachi Bonds and she is aware per SN---that pt will need to use walker, cane and she will need to keep appts with GI and daughter can try to take over her meds again.  Malachi Bonds voiced her understanding of these recs and will try all of these again.  She is to call for any other concerns or questions.

## 2011-09-05 NOTE — Telephone Encounter (Signed)
Called and spoke with gloria--pts daughter.  She is concerned about her mother and would like to discuss issues about her mother.  She stated that her mother could not tell her anything about her OV with SN today.  She stated that her memory comes and goes.  She is going to have her esophagus stretched on Monday and the daughter is concerned about this.  She stated that her mother will not use a cane or walker to help her.  They do have home health coming in on Friday and maybe for 3 days during the week and they check on her everyday, sometimes 2 times per day.  Daughter wanted to know if SN had any other suggestions.  She stated that the pt will not take the aricept or use the walker cause this will make her older.  SN please advise.  thanks

## 2011-09-05 NOTE — Patient Instructions (Signed)
Today we updated your med list in our EPIC system...    Continue your current medications the same...  Use the Incentive spirometer to help you get good deep breaths & expand your lungs...  Today we did your follow up blood work...    Please call the PHONE TREE in a few days for your results...    Dial [id number] & when prompted enter your patient number followed by the # symbol...    Your patient number is:  [id number]#  Let's plan a short term recheck in 1 month.Marland KitchenMarland Kitchen

## 2011-09-07 LAB — PROTEIN ELECTROPHORESIS, SERUM, WITH REFLEX
Albumin ELP: 59.1 % (ref 55.8–66.1)
Alpha-1-Globulin: 5.2 % — ABNORMAL HIGH (ref 2.9–4.9)
Beta 2: 4.1 % (ref 3.2–6.5)
Beta Globulin: 5.5 % (ref 4.7–7.2)
Gamma Globulin: 18.3 % (ref 11.1–18.8)

## 2011-09-09 ENCOUNTER — Encounter: Payer: Self-pay | Admitting: Pulmonary Disease

## 2011-09-10 ENCOUNTER — Encounter (HOSPITAL_COMMUNITY)
Admission: RE | Disposition: A | Discharge status: Home / Self Care | Payer: Self-pay | Source: Ambulatory Visit | Attending: Gastroenterology

## 2011-09-10 ENCOUNTER — Ambulatory Visit (HOSPITAL_COMMUNITY)
Admission: RE | Admit: 2011-09-10 | Discharge: 2011-09-10 | Disposition: A | Discharge status: Home / Self Care | Payer: Medicare Other | Source: Ambulatory Visit | Attending: Gastroenterology | Admitting: Gastroenterology

## 2011-09-10 ENCOUNTER — Encounter (HOSPITAL_COMMUNITY): Payer: Self-pay

## 2011-09-10 ENCOUNTER — Ambulatory Visit (HOSPITAL_COMMUNITY): Payer: Medicare Other

## 2011-09-10 DIAGNOSIS — K449 Diaphragmatic hernia without obstruction or gangrene: Secondary | ICD-10-CM | POA: Insufficient documentation

## 2011-09-10 DIAGNOSIS — D131 Benign neoplasm of stomach: Secondary | ICD-10-CM | POA: Insufficient documentation

## 2011-09-10 HISTORY — PX: SAVORY DILATION: SHX5439

## 2011-09-10 HISTORY — PX: ESOPHAGOGASTRODUODENOSCOPY: SHX5428

## 2011-09-10 SURGERY — EGD (ESOPHAGOGASTRODUODENOSCOPY)
Anesthesia: Moderate Sedation

## 2011-09-10 MED ORDER — FENTANYL NICU IV SYRINGE 50 MCG/ML
INJECTION | INTRAMUSCULAR | Status: DC | PRN
  Administered 2011-09-10: 15 ug via INTRAVENOUS
  Administered 2011-09-10: 25 ug via INTRAVENOUS
  Administered 2011-09-10: 10 ug via INTRAVENOUS
  Administered 2011-09-10: 25 ug via INTRAVENOUS

## 2011-09-10 MED ORDER — SODIUM CHLORIDE 0.9 % IV SOLN
Freq: Once | INTRAVENOUS | Status: AC
  Administered 2011-09-10: 500 mL via INTRAVENOUS

## 2011-09-10 MED ORDER — BUTAMBEN-TETRACAINE-BENZOCAINE 2-2-14 % EX AERO
INHALATION_SPRAY | CUTANEOUS | Status: DC | PRN
  Administered 2011-09-10: 2 via TOPICAL

## 2011-09-10 MED ORDER — MIDAZOLAM HCL 10 MG/2ML IJ SOLN
INTRAMUSCULAR | Status: AC
  Filled 2011-09-10: qty 2

## 2011-09-10 MED ORDER — MIDAZOLAM HCL 10 MG/2ML IJ SOLN
INTRAMUSCULAR | Status: DC | PRN
  Administered 2011-09-10 (×2): 2 mg via INTRAVENOUS
  Administered 2011-09-10: 1 mg via INTRAVENOUS

## 2011-09-10 MED ORDER — FENTANYL CITRATE 0.05 MG/ML IJ SOLN
INTRAMUSCULAR | Status: AC
  Filled 2011-09-10: qty 2

## 2011-09-10 NOTE — Op Note (Signed)
[facility] [address] [place], [place]  [address]  ENDOSCOPY PROCEDURE REPORT  PATIENT:  Margaret [name], Margaret [name]  MR#:  #[id number] BIRTHDATE:  [birth year], 88 yrs. old  GENDER:  female  ENDOSCOPIST:  Vida Rigger, MD Referred by:   Alroy Dust  PROCEDURE DATE:  09/10/2011 PROCEDURE:  EGD with Savary dilation ASA CLASS:  3 INDICATIONS:   dysphasia helped with dilation in the past  MEDICATIONS:  75 mcg fentanyl 5 mg Versed TOPICAL ANESTHETIC: Used  DESCRIPTION OF PROCEDURE:   After the risks benefits and alternatives of the procedure were thoroughly explained, informed consent was obtained.  The Pentax Gastroscope [id number] endoscope was introduced through the mouth and advanced to the second portion of the duodenum, without limitations.  The instrument was slowly withdrawn as the mucosa was fully examined. The findings are discussed below and once the complete endoscopy was completed the scope was readvanced to the antrum and a Savary wire was advanced and the customary J. loop was confirmed under fluoroscopy and endoscopic guidance. The scope was slowly withdrawn making sure the wire stayed in the proper position under fluoroscopy and once the scope was removed the Savary 15 mm dilator was advanced and confirmed in the proper position under fluoroscopy there was no resistance in passing the dilator and no heme on the dilator the wire was withdrawn back into the dilator both were removed in tandem the procedure was terminated at this juncture the patient tolerated the procedure well there was no obvious immediate complication <<PROCEDUREIMAGES>>  FINDINGS: #1. Small hiatal hernia #2. Multiple tiny gastric polyp 3. Otherwise within normal limits EGD Therapy: Savary dilatation to 15 mm under fluoroscopy without resistance or heme  COMPLICATIONS: None  ENDOSCOPIC IMPRESSION: Above  RECOMMENDATIONS: Slowly advance diet continue MiraLAX and pump inhibitors and  call me when necessary and followup in one month to see if more aggressive dilation is needed  REPEAT EXAM:  As needed  ______________________________ Vida Rigger, MD  CC:  n. eSIGNEDVida Rigger at 09/10/2011 09:28 AM  Sandria Senter, [phone]

## 2011-09-11 ENCOUNTER — Encounter (HOSPITAL_COMMUNITY): Payer: Self-pay | Admitting: Gastroenterology

## 2011-09-15 ENCOUNTER — Other Ambulatory Visit: Payer: Self-pay | Admitting: Pulmonary Disease

## 2011-10-09 ENCOUNTER — Encounter: Payer: Self-pay | Admitting: Pulmonary Disease

## 2011-10-09 ENCOUNTER — Ambulatory Visit (INDEPENDENT_AMBULATORY_CARE_PROVIDER_SITE_OTHER): Payer: Medicare Other | Admitting: Pulmonary Disease

## 2011-10-09 DIAGNOSIS — I10 Essential (primary) hypertension: Secondary | ICD-10-CM

## 2011-10-09 DIAGNOSIS — D649 Anemia, unspecified: Secondary | ICD-10-CM

## 2011-10-09 DIAGNOSIS — J4489 Other specified chronic obstructive pulmonary disease: Secondary | ICD-10-CM

## 2011-10-09 DIAGNOSIS — K219 Gastro-esophageal reflux disease without esophagitis: Secondary | ICD-10-CM

## 2011-10-09 DIAGNOSIS — J329 Chronic sinusitis, unspecified: Secondary | ICD-10-CM

## 2011-10-09 DIAGNOSIS — F411 Generalized anxiety disorder: Secondary | ICD-10-CM

## 2011-10-09 DIAGNOSIS — E039 Hypothyroidism, unspecified: Secondary | ICD-10-CM

## 2011-10-09 DIAGNOSIS — K5900 Constipation, unspecified: Secondary | ICD-10-CM

## 2011-10-09 DIAGNOSIS — I679 Cerebrovascular disease, unspecified: Secondary | ICD-10-CM

## 2011-10-09 DIAGNOSIS — M1990 Unspecified osteoarthritis, unspecified site: Secondary | ICD-10-CM

## 2011-10-09 DIAGNOSIS — J449 Chronic obstructive pulmonary disease, unspecified: Secondary | ICD-10-CM

## 2011-10-09 NOTE — Patient Instructions (Signed)
Today we updated your med list in our EPIC system...    Continue your current medications the same...  I recommend adding a Women's formula multivit daily...  Call for any questions...  Let's plan a follow up visit in 2 months, sooner if needed for problems.Marland KitchenMarland Kitchen

## 2011-10-11 ENCOUNTER — Other Ambulatory Visit: Payer: Self-pay

## 2011-10-11 ENCOUNTER — Emergency Department (HOSPITAL_COMMUNITY)
Admission: EM | Admit: 2011-10-11 | Discharge: 2011-10-11 | Disposition: A | Discharge status: Home / Self Care | Payer: Medicare Other | Attending: Emergency Medicine | Admitting: Emergency Medicine

## 2011-10-11 ENCOUNTER — Emergency Department (HOSPITAL_COMMUNITY): Payer: Medicare Other

## 2011-10-11 ENCOUNTER — Encounter (HOSPITAL_COMMUNITY): Payer: Self-pay | Admitting: Emergency Medicine

## 2011-10-11 DIAGNOSIS — N390 Urinary tract infection, site not specified: Secondary | ICD-10-CM | POA: Insufficient documentation

## 2011-10-11 DIAGNOSIS — E7800 Pure hypercholesterolemia, unspecified: Secondary | ICD-10-CM | POA: Insufficient documentation

## 2011-10-11 DIAGNOSIS — E871 Hypo-osmolality and hyponatremia: Secondary | ICD-10-CM | POA: Insufficient documentation

## 2011-10-11 DIAGNOSIS — R109 Unspecified abdominal pain: Secondary | ICD-10-CM | POA: Insufficient documentation

## 2011-10-11 DIAGNOSIS — I10 Essential (primary) hypertension: Secondary | ICD-10-CM | POA: Insufficient documentation

## 2011-10-11 DIAGNOSIS — R197 Diarrhea, unspecified: Secondary | ICD-10-CM | POA: Insufficient documentation

## 2011-10-11 DIAGNOSIS — R1110 Vomiting, unspecified: Secondary | ICD-10-CM | POA: Insufficient documentation

## 2011-10-11 DIAGNOSIS — K219 Gastro-esophageal reflux disease without esophagitis: Secondary | ICD-10-CM | POA: Insufficient documentation

## 2011-10-11 DIAGNOSIS — K5900 Constipation, unspecified: Secondary | ICD-10-CM | POA: Insufficient documentation

## 2011-10-11 LAB — CBC
HCT: 29.2 % — ABNORMAL LOW (ref 36.0–46.0)
MCV: 84.1 fL (ref 78.0–100.0)
Platelets: 186 10*3/uL (ref 150–400)
RBC: 3.47 MIL/uL — ABNORMAL LOW (ref 3.87–5.11)
WBC: 5.4 10*3/uL (ref 4.0–10.5)

## 2011-10-11 LAB — COMPREHENSIVE METABOLIC PANEL
ALT: 17 U/L (ref 0–35)
Alkaline Phosphatase: 70 U/L (ref 39–117)
CO2: 29 mEq/L (ref 19–32)
Calcium: 10 mg/dL (ref 8.4–10.5)
Chloride: 87 mEq/L — ABNORMAL LOW (ref 96–112)
GFR calc Af Amer: >90 mL/min (ref >90)
GFR calc non Af Amer: 81 mL/min — ABNORMAL LOW (ref >90)
Glucose, Bld: 103 mg/dL — ABNORMAL HIGH (ref 70–99)
Sodium: 125 mEq/L — ABNORMAL LOW (ref 135–145)
Total Bilirubin: 0.7 mg/dL (ref 0.3–1.2)

## 2011-10-11 LAB — URINALYSIS, ROUTINE W REFLEX MICROSCOPIC
Bilirubin Urine: NEGATIVE
Ketones, ur: NEGATIVE mg/dL
Nitrite: NEGATIVE
Protein, ur: 30 mg/dL — AB
Urobilinogen, UA: 0.2 mg/dL (ref 0.0–1.0)

## 2011-10-11 LAB — URINE MICROSCOPIC-ADD ON

## 2011-10-11 LAB — DIFFERENTIAL
Eosinophils Relative: 0 % (ref 0–5)
Lymphocytes Relative: 31 % (ref 12–46)
Lymphs Abs: 1.7 10*3/uL (ref 0.7–4.0)

## 2011-10-11 LAB — GLUCOSE, CAPILLARY: Glucose-Capillary: 100 mg/dL — ABNORMAL HIGH (ref 70–99)

## 2011-10-11 MED ORDER — GLYCERIN (LAXATIVE) 2.1 G RE SUPP
1.0000 | RECTAL | Status: AC
  Administered 2011-10-11: 1 via RECTAL
  Filled 2011-10-11: qty 1

## 2011-10-11 MED ORDER — DEXTROSE 5 % IV SOLN
1.0000 g | Freq: Once | INTRAVENOUS | Status: AC
  Administered 2011-10-11: 1 g via INTRAVENOUS
  Filled 2011-10-11: qty 10

## 2011-10-11 MED ORDER — POLYETHYLENE GLYCOL 3350 17 G PO PACK: 17.0000 g | PACK | Freq: Two times a day (BID) | ORAL | Status: AC

## 2011-10-11 MED ORDER — CEPHALEXIN 500 MG PO CAPS: 500.0000 mg | ORAL_CAPSULE | Freq: Four times a day (QID) | ORAL | Status: DC

## 2011-10-11 NOTE — ED Notes (Addendum)
Patient states she is having mid sternal chest pain. Patient placed on monitor and EKG being captured at this time.

## 2011-10-11 NOTE — ED Provider Notes (Signed)
Pt with lower abd discomfort/loose stools.  Vomiting yesterday, but none today.  Hx of fecal impactions in past.  Also admitted recently for UTI/hyponatremia.  Will check labs, disimpaction.  Re-evaluate.  Pt seen/examined with resident physician.  Rolan Bucco, MD 10/11/11 1159

## 2011-10-11 NOTE — ED Notes (Signed)
Patient resting and remains on monitor with NAD at this time.  

## 2011-10-11 NOTE — ED Notes (Signed)
Patient states she is feeling better at this time. Family at bedside.

## 2011-10-11 NOTE — Discharge Instructions (Signed)
Increase your miralax frequency to twice daily for 3 days.  Drink lots of water.  Complete your full course of antibiotics.  Take tylenol (maximum of 4000mg  per day) as needed for pain.  Return immediately if you experience return of your chest pain, shortness of breath, worsening rectal pain, significant abdominal pain, or other concerns.   Constipation in Adults Constipation is having fewer than 2 bowel movements per week. Usually, the stools are hard. As we grow older, constipation is more common. If you try to fix constipation with laxatives, the problem may get worse. This is because laxatives taken over a long period of time make the colon muscles weaker. A low-fiber diet, not taking in enough fluids, and taking some medicines may make these problems worse. MEDICATIONS THAT MAY CAUSE CONSTIPATION  Water pills (diuretics).   Calcium channel blockers (used to control blood pressure and for the heart).   Certain pain medicines (narcotics).   Anticholinergics.   Anti-inflammatory agents.   Antacids that contain aluminum.  DISEASES THAT CONTRIBUTE TO CONSTIPATION  Diabetes.   Parkinson's disease.   Dementia.   Stroke.   Depression.   Illnesses that cause problems with salt and water metabolism.  HOME CARE INSTRUCTIONS   Constipation is usually best cared for without medicines. Increasing dietary fiber and eating more fruits and vegetables is the best way to manage constipation.   Slowly increase fiber intake to 25 to 38 grams per day. Whole grains, fruits, vegetables, and legumes are good sources of fiber. A dietitian can further help you incorporate high-fiber foods into your diet.   Drink enough water and fluids to keep your urine clear or pale yellow.   A fiber supplement may be added to your diet if you cannot get enough fiber from foods.   Increasing your activities also helps improve regularity.   Suppositories, as suggested by your caregiver, will also help. If you  are using antacids, such as aluminum or calcium containing products, it will be helpful to switch to products containing magnesium if your caregiver says it is okay.   If you have been given a liquid injection (enema) today, this is only a temporary measure. It should not be relied on for treatment of longstanding (chronic) constipation.   Stronger measures, such as magnesium sulfate, should be avoided if possible. This may cause uncontrollable diarrhea. Using magnesium sulfate may not allow you time to make it to the bathroom.  SEEK IMMEDIATE MEDICAL CARE IF:   There is bright red blood in the stool.   The constipation stays for more than 4 days.   There is belly (abdominal) or rectal pain.   You do not seem to be getting better.   You have any questions or concerns.  MAKE SURE YOU:   Understand these instructions.   Will watch your condition.   Will get help right away if you are not doing well or get worse.  Document Released: 04/27/2004 Document Revised: 04/11/2011 Document Reviewed: 03/17/2008 [facility] Patient Information 2012 [place], [place].   Urinary Tract Infection Infections of the urinary tract can start in several places. A bladder infection (cystitis), a kidney infection (pyelonephritis), and a prostate infection (prostatitis) are different types of urinary tract infections (UTIs). They usually get better if treated with medicines (antibiotics) that kill germs. Take all the medicine until it is gone. You or your child may feel better in a few days, but TAKE ALL MEDICINE or the infection may not respond and may become more difficult to  treat. HOME CARE INSTRUCTIONS   Drink enough water and fluids to keep the urine clear or pale yellow. Cranberry juice is especially recommended, in addition to large amounts of water.   Avoid caffeine, tea, and carbonated beverages. They tend to irritate the bladder.   Alcohol may irritate the prostate.   Only take over-the-counter or  prescription medicines for pain, discomfort, or fever as directed by your caregiver.  To prevent further infections:  Empty the bladder often. Avoid holding urine for long periods of time.   After a bowel movement, women should cleanse from front to back. Use each tissue only once.   Empty the bladder before and after sexual intercourse.  FINDING OUT THE RESULTS OF YOUR TEST Not all test results are available during your visit. If your or your child's test results are not back during the visit, make an appointment with your caregiver to find out the results. Do not assume everything is normal if you have not heard from your caregiver or the medical facility. It is important for you to follow up on all test results. SEEK MEDICAL CARE IF:   There is back pain.   Your baby is older than 3 months with a rectal temperature of 100.5 F (38.1 C) or higher for more than 1 day.   Your or your child's problems (symptoms) are no better in 3 days. Return sooner if you or your child is getting worse.  SEEK IMMEDIATE MEDICAL CARE IF:   There is severe back pain or lower abdominal pain.   You or your child develops chills.   You have a fever.   Your baby is older than 3 months with a rectal temperature of 102 F (38.9 C) or higher.   Your baby is 33 months old or younger with a rectal temperature of 100.4 F (38 C) or higher.   There is nausea or vomiting.   There is continued burning or discomfort with urination.  MAKE SURE YOU:   Understand these instructions.   Will watch your condition.   Will get help right away if you are not doing well or get worse.  Document Released: 05/09/2005 Document Revised: 04/11/2011 Document Reviewed: 12/12/2006 [facility] Patient Information 2012 [place], [place].Constipation in Adults Constipation is having fewer than 2 bowel movements per week. Usually, the stools are hard. As we grow older, constipation is more common. If you try to fix constipation with  laxatives, the problem may get worse. This is because laxatives taken over a long period of time make the colon muscles weaker. A low-fiber diet, not taking in enough fluids, and taking some medicines may make these problems worse. MEDICATIONS THAT MAY CAUSE CONSTIPATION  Water pills (diuretics).   Calcium channel blockers (used to control blood pressure and for the heart).   Certain pain medicines (narcotics).   Anticholinergics.   Anti-inflammatory agents.   Antacids that contain aluminum.  DISEASES THAT CONTRIBUTE TO CONSTIPATION  Diabetes.   Parkinson's disease.   Dementia.   Stroke.   Depression.   Illnesses that cause problems with salt and water metabolism.  HOME CARE INSTRUCTIONS   Constipation is usually best cared for without medicines. Increasing dietary fiber and eating more fruits and vegetables is the best way to manage constipation.   Slowly increase fiber intake to 25 to 38 grams per day. Whole grains, fruits, vegetables, and legumes are good sources of fiber. A dietitian can further help you incorporate high-fiber foods into your diet.   Drink enough water  and fluids to keep your urine clear or pale yellow.   A fiber supplement may be added to your diet if you cannot get enough fiber from foods.   Increasing your activities also helps improve regularity.   Suppositories, as suggested by your caregiver, will also help. If you are using antacids, such as aluminum or calcium containing products, it will be helpful to switch to products containing magnesium if your caregiver says it is okay.   If you have been given a liquid injection (enema) today, this is only a temporary measure. It should not be relied on for treatment of longstanding (chronic) constipation.   Stronger measures, such as magnesium sulfate, should be avoided if possible. This may cause uncontrollable diarrhea. Using magnesium sulfate may not allow you time to make it to the bathroom.  SEEK  IMMEDIATE MEDICAL CARE IF:   There is bright red blood in the stool.   The constipation stays for more than 4 days.   There is belly (abdominal) or rectal pain.   You do not seem to be getting better.   You have any questions or concerns.  MAKE SURE YOU:   Understand these instructions.   Will watch your condition.   Will get help right away if you are not doing well or get worse.  Document Released: 04/27/2004 Document Revised: 04/11/2011 Document Reviewed: 03/17/2008 [facility] Patient Information 2012 [place], [place].

## 2011-10-11 NOTE — ED Notes (Addendum)
First meeting with patient. Patient states she can not have a normal BM x 5 days. Patient states she has watery stool but feel a hard ball of stool that will not pass. Patient experienced nausea and vomiting brown liquid yesterday. Patient denies chest pain and denies fever. Patient states she has taken 4-5 delcolax yesterday. Patient denies abdominal pain except when trying to have BM.

## 2011-10-11 NOTE — ED Provider Notes (Signed)
History     CSN: [id number]  Arrival date & time 10/11/11  0950   First MD Initiated Contact with Patient 10/11/11 1008      Chief Complaint  Patient presents with  . Rectal pain    (Consider location/radiation/quality/duration/timing/severity/associated sxs/prior treatment) HPI History provided by the patient.  78 year old female with history of chronic constipation presenting with complaint of "diarrhea around a lump in my bottom and hurts."  States that episode began one to 2 days ago and was gradual in onset, constant, and moderate to severe. Patient states that she takes Dulcolax and MiraLax daily for chronic constipation and has not changed this regiment.  She began noting that she was having loose diarrhea but also feels as though she has a large "lump" in her bottom, which is especially painful when she attempts to have a bowel movement and she feels as though the diarrhea is going around this large lump.  She also reports associated vomiting x ~4-5 yesterday that smelled/looked "like shit."  Mild decreased PO yesterday and today.  Pt also reports diffuse abd pain but only "when straining" to have a BM.   Her symptoms are very similar to those in the past, at which time she has required a manual disimpaction.  Pt was seen in the last 1-2 months for this with manual disimpaction and subsequent admission for hyponatremia and UTI.   Past Medical History  Diagnosis Date  . Unspecified sinusitis (chronic)   . Unspecified essential hypertension   . Cerebrovascular disease, unspecified   . Unspecified venous (peripheral) insufficiency   . Pure hypercholesterolemia   . Unspecified disorder of thyroid   . Esophageal reflux   . Unspecified constipation   . Urinary tract infection, site not specified   . Osteoarthrosis, unspecified whether generalized or localized, unspecified site   . Degeneration of cervical intervertebral disc   . Closed fracture of other bone of wrist   .  Anxiety state, unspecified   . Idiopathic urticaria   . Anemia, unspecified   . Other B-complex deficiencies   . Herpes zoster without mention of complication     Past Surgical History  Procedure Date  . Tonsillectomy   . Total abdominal hysterectomy w/ bilateral salpingoophorectomy   . Cervical fusion   . Cataract extraction   . Nasal sinus surgery   . Esophagogastroduodenoscopy 09/10/2011    Procedure: ESOPHAGOGASTRODUODENOSCOPY (EGD);  Surgeon: Petra Kuba, MD;  Location: [facility] ENDOSCOPY;  Service: Endoscopy;  Laterality: N/A;  c-arm needed  . Savory dilation 09/10/2011    Procedure: SAVORY DILATION;  Surgeon: Petra Kuba, MD;  Location: [facility] ENDOSCOPY;  Service: Endoscopy;  Laterality: N/A;    Family History  Problem Relation Age of Onset  . Diabetes Father   . Heart failure Mother   . Diabetes Sister     History  Substance Use Topics  . Smoking status: Never Smoker   . Smokeless tobacco: Never Used  . Alcohol Use: No    OB History    Grav Para Term Preterm Abortions TAB SAB Ect Mult Living                  Review of Systems  Constitutional: Positive for appetite change. Negative for fever and chills.  HENT: Negative for congestion, sore throat and rhinorrhea.   Eyes: Negative for pain and visual disturbance.  Respiratory: Negative for cough, shortness of breath and wheezing.   Cardiovascular: Negative for chest pain and palpitations.  Gastrointestinal: Positive for vomiting,  abdominal pain (only with BMs), diarrhea, constipation and rectal pain. Negative for blood in stool.  Genitourinary: Positive for dysuria and urgency. Negative for frequency and hematuria.  Musculoskeletal: Negative for back pain and gait problem.  Skin: Negative for rash and wound.  Neurological: Negative for dizziness and headaches.  Psychiatric/Behavioral: Negative for confusion and agitation.  All other systems reviewed and are negative.    Allergies  Review of patient's allergies  indicates no known allergies.  Home Medications   Current Outpatient Rx  Name Route Sig Dispense Refill  . ASPIRIN 81 MG PO TABS Oral Take 81 mg by mouth daily.      . B COMPLEX PO TABS Oral Take 1 tablet by mouth daily.     Marland Kitchen VITAMIN D 1000 UNITS PO CAPS Oral Take 1,000 Units by mouth daily.      Marland Kitchen ENSURE PUDDING PO PUDG Oral Take 1 Container by mouth 2 (two) times daily as needed.    . GUAIFENESIN ER 600 MG PO TB12 Oral Take 1,200 mg by mouth 2 (two) times daily as needed. For cough.    Marland Kitchen LEVOTHYROXINE SODIUM 75 MCG PO TABS Oral Take 1 tablet (75 mcg total) by mouth daily. 30 tablet 11  . LOSARTAN POTASSIUM 50 MG PO TABS Oral Take 50 mg by mouth daily.    . MULTIVITAMINS PO Oral Take 1 tablet by mouth daily.     Marland Kitchen PINDOLOL 5 MG PO TABS Oral Take 5 mg by mouth 2 (two) times daily.    Marland Kitchen POLYETHYLENE GLYCOL 3350 PO PACK Oral Take 17 g by mouth daily. Hold for diarrhea    . POTASSIUM CHLORIDE CRYS ER 20 MEQ PO TBCR Oral Take 20 mEq by mouth daily.      BP 150/65  Pulse 76  Temp 98.1 F (36.7 C)  Resp 20  SpO2 100%  Physical Exam  Nursing note and vitals reviewed. Constitutional: She is oriented to person, place, and time.       Alert, appears uncomfortable but NAD, mildly slurred speech but reportedly baseline  HENT:  Head: Normocephalic and atraumatic.  Right Ear: External ear normal.  Left Ear: External ear normal.  Nose: Nose normal.       Mildly enlarged upper lip (also reportedly her baseline)  Eyes: Conjunctivae and EOM are normal. Pupils are equal, round, and reactive to light.  Neck: Normal range of motion. Neck supple.  Cardiovascular: Normal rate, regular rhythm and intact distal pulses.   No murmur heard. Pulmonary/Chest: Effort normal and breath sounds normal. No respiratory distress.  Abdominal: Soft. Bowel sounds are normal. She exhibits no distension. There is no tenderness. There is no rebound.  Genitourinary: Guaiac negative stool.       Stool noted upon  external anal inspection; digital exam with noted large, hard stool burden in the rectum  Musculoskeletal: Normal range of motion. She exhibits no edema.  Neurological: She is alert and oriented to person, place, and time.  Skin: Skin is warm and dry. No rash noted. She is not diaphoretic.  Psychiatric: She has a normal mood and affect. Judgment normal.    ED Course  Procedures (including critical care time)   Date: 10/11/2011  Rate: 80  Rhythm: normal sinus rhythm  QRS Axis: normal  Intervals: first degree AV block  ST/T Wave abnormalities: normal  Old EKG Reviewed:  No significant changes noted    Labs Reviewed  CBC - Abnormal; Notable for the following:    RBC 3.47 (*)  Hemoglobin 10.3 (*)    HCT 29.2 (*)    All other components within normal limits  COMPREHENSIVE METABOLIC PANEL - Abnormal; Notable for the following:    Sodium 125 (*)    Chloride 87 (*)    Glucose, Bld 103 (*)    GFR calc non Af Amer 81 (*)    All other components within normal limits  URINALYSIS, ROUTINE W REFLEX MICROSCOPIC - Abnormal; Notable for the following:    APPearance CLOUDY (*)    Hgb urine dipstick TRACE (*)    Protein, ur 30 (*)    Leukocytes, UA LARGE (*)    All other components within normal limits  GLUCOSE, CAPILLARY - Abnormal; Notable for the following:    Glucose-Capillary 100 (*)    All other components within normal limits  URINE MICROSCOPIC-ADD ON - Abnormal; Notable for the following:    Casts HYALINE CASTS (*)    All other components within normal limits  DIFFERENTIAL  OCCULT BLOOD, POC DEVICE  URINE CULTURE  GRAM STAIN   Dg Abd Acute W/chest  10/11/2011  *RADIOLOGY REPORT*  Clinical Data: Abdominal pain.  ACUTE ABDOMEN SERIES (ABDOMEN 2 VIEW & CHEST 1 VIEW)  Comparison: 08/29/2011  Findings: There is hyperinflation of the lungs compatible with COPD.  Biapical scarring.  No confluent opacities otherwise.  No effusions.  Moderate stool burden in the colon.  No obstruction  or free air. No organomegaly or suspicious calcification.  Degenerative changes in the lumbar spine and hips.  Dense vascular calcifications in the aorta and branch vessels without evidence of aneurysm.  IMPRESSION: COPD.  No obstruction or free air.  Original Report Authenticated By: Cyndie Chime, M.D.     1. UTI (lower urinary tract infection)   2. Constipation   3. Chronic hyponatremia       MDM  78 y.o. F with chronic constipation presenting with acute rectal pain and sensation of rectal stool burden.  No abd pain, although vomiting yday with possible stool output.    Exam as above, AF/VSS, benign abd, no vomiting or c/o nausea during ED course.  Films without Sn's of obstruction.  Rectal exam performed and with moderate hard stool burden but essential resolution of this after manual disimpaction, which was performed x 3.  Pt was also able to have ~2 more formed BMs, although small.    Labs with hyponatremia to 125 similar to prior; creat WNL; no leukocytosis; UA c/w UTI; rocephin ordered.  3:00PM - Pt c/o atypical CP, described as acute, sharp, central, lasting 2 min with spontaneous resolve and without associated SOB, N, or diaphoresis.  Doubt ACS, PE, dissection, or PTX.  Pt reexamined, denies current pain.  CXR without PNA.  EKG without acute ischemia.  Do not believe further workup is warranted at this time.  Plan to place a suppository prior to pt d/c, increase miralax frequency to BID, increased water intake, and f/u with PCP tomorrow; pt reportedly comfortable with this plan.  Strict return precautions reviewed.    Particia Lather, MD 10/11/11 [phone]

## 2011-10-11 NOTE — ED Notes (Signed)
States is having diarrhea and feels a lump in bottom and it will not come out she states no good bm

## 2011-10-11 NOTE — ED Notes (Signed)
Resident hand carried stool card to lab.

## 2011-10-11 NOTE — ED Notes (Signed)
Pharmacy emailed for suppository and ask to send ASAP due to patient being up for discharge.

## 2011-10-14 LAB — URINE CULTURE

## 2011-10-14 NOTE — Progress Notes (Signed)
Subjective:    Patient ID: Margaret [name], female    DOB: 1923-11-17, 78 y.o.   MRN: [id number]  HPI 78 y/o WF here for a follow up visit... She has Chr Sinusitis;  HBP;  ASPVD;  Chol;  GERD/ Constip w/ hx impaction;  UTIs;  DJD/ DDD;  Hx anxiety;  Hx anemia...   ~  July 17, 2010:  she was hosp 10/27 to 06/10/10 by [facility] w/ constipation & fecal impaction> her GI is DrMagod;  CT Abd showed norm GB, sm bilat renal cysts, prev hyst, rectal wall thickening c/w proctitis;  Labs OK x mild anemia w/ Hg~10, borderline hypothy;  she was disimpacted & re-started on her bowel regimen of MIRALAX & SENAKOT-S...  she is back to baseline but weak, wt down to 110# & asked to incr the supplements to Bid... chjr sinus prob w/o change;  BP controlled & stable on meds;  denies cerebral ischemic symptoms;  see prob list below>>>  ~  November 06, 2010:  27mo ROV w/ review of mult chr problems & FASTING blood work today>  Review shows elev TSH= 9.85 & we will need to start her on Synthroid 56mcg/d...  ~  March 08, 2011:  27mo ROV & she feels as though she is "a little bit better" but still c/o weak & thinks one of her meds is keeping her up at night (doesn't know which one);  She stopped several meds including Mucinex, Losartan, Crestor, KCl, & Fe;  We decided to recheck labs> FLP looks good today ?is this on or off the Cres20 (Pharm says last filled #30 on 11/11/10)==> keep same & bring all med bottles to f/u visit; Chems= norm x Na=128; Hg=11.1, Fe= 87, B12= 418, TSH=0.27  ~  July 09, 2011:  27mo ROV & she brought all of her med bottles to the office today to review>>    Chr sinusitis>  Hx allergies & chr sinus problems w/ polyps & septal perf prev eval by DrShoemaker;  Currently only uses Mucinex & Saline prn...    HBP & pre-syncope>  She saw DrPRoss 10/12 after a spell at [employer] w/ near syncope; she adjusted her meds by stopping Cozaar50 & Amlodipine2.5, & starting PINDOLOL5Bid; Review of meds shows that she never stopped  Losartan or Norvasc, she did fill Visken5 but only taking one/d; BP= 132/70 w/ transient postural drop ==> Rec to stop Amlodipine, continue Cozaar50, incr Pindolol5Bid...     Cerebrovasc dis>  On ASA 81mg /d;  sm vessel dis on prev MRI & faint bilat CBruits w/ hx neg CDopplers in the past...    Chol>  Prev on Simva40 but stopped by the pt 8/11 due to "weak"; we tried to get her on Crestor & so did DrRoss but pt stopped this too (?made her weak)...    GERD>  On Prevacid 30mg /d, known HH & followed by [facility] for GI w/ dilatation 11/11 & swallowing better...    Constip>  Hx severe constip w/ hosp for impaction 2003;  On Miralax daily & doing OK...    DJD>  Can't take NSAIDs well, uses Tylenol & prn Advil;  C/o some left ankle pain on & off...    Anxiety>  Not on meds...    Anemia & B12 defic>  Takes Fe daily & B12 shots monthly;  Labs today showed Hg=12.0, MCV=90...  ~  September 05, 2011:  7mo ROV & post hosp> Adm 08/29/11 w/ adb pain, fecal impaction, worsening dysphagia, UTI, HBP &  chronic hyponatremia==> improved after fluids, laxatives, antibiotics, etc; MBS showed dysphagia & she is on a liq diet==> she has f/u w/ DrMagod soon for EGD & dilatation...  See Prob List below>>  ~  October 09, 2011:  69mo ROV> she reports doing fine, having good BMs, & no new complaints or concerns today...   BP controlled on Cozaar & Visken, 142/64 today, denies CP/ palpit/ ch in SOB/ edema...  She had EGD w/ Savory dilatation from her gastroenterologist [facility] 09/10/11> essentially neg EGD (sm HH, few tiny gastric polyps) & dilated w/o difficulty; states swallowing is improved, OK now...  Denies constip now on good regimen w/ Miralax daily & Dulcolax tabs/ suppos as needed...  Other problems as noted>>          Problem List:  SINUSITIS, CHRONIC (ICD-473.9) - Long Hx chronic sinus problems - Eval & Rx by DrShoemaker w/ hx epistaxis, nasal septal erosion w/ prev "button";  and nasal polyps... she uses Saline, Mucinex,  etc... she notes mild chr cough from the drainage- we reviewed chronic nasal regimen & rec MUCINEX DM 2Bid...  HYPERTENSION (ICD-401.9) >> currently controlled on LOSARTAN 50mg /d & PINDOLOL 5mg Bid... ~  Baseline EKG w/ NSR, WNL... ~  2D ECHO 2001 was normal... ~  CXR 11/07 w/ Biapical pl thickening, NAD... CXR 9/09 showed COPD w/ incr markings, NAD... f/u CXR 5/11 showed COPD, NAD...  ~  11/10: c/o weak in AM- decision to decr Amlodipine to 5mg /d & BP has been stable on this. ~  8/11: Hosp by [facility] w/ "presyncope" & meds were adjusted but she restarted prev Rx on discharge> we decided to stop Diovan/Hct in favor of LOSARTAN 50mg /d (?if she ever even filled it- reminded to bring all med bottles to every visit). ~  7/12: BP today= 120/70, no postural changes, ?on Amlodip5 & Losartan50... she denies HA, visual symptoms, CP, palpit, change in SOB, edema, etc... ~  10/12: Near syncope at [employer]> saw DrRoss w/ attempted meds changes (asked to stop Amlodip/Losartan & start Pindolol5Bid) but she decided to take all 3... ~  11/12: BP today= 132/70 w/ transient postural drop; asked to stop Amlodip, continue LOSARTAN50, take VISKEN5Bid... ~  1/13: BP= 118/72 on same meds & feeling better since disch... ~  2/13: BP= 142/64 on Losar50 & Pindolol5Bid, continue same...  CEREBROVASCULAR DISEASE (ICD-437.9) - on ASA 81mg /d... Prev eval for syncope (related to postural BP changes) w/ MRI showing small vessel dz;  she has bilat CBruits w/ Carotid Dopplers in 2001 showing mild plaque, no signif ICA stenoses... ~  she denies cerebral ischemic symptoms on the ASA 81mg /d...  VENOUS INSUFFICIENCY (ICD-459.81) - on low sodium diet + support hose as needed.  HYPERCHOLESTEROLEMIA (ICD-272.0) - prev on Simva40 but she thinks this made her weak & she stopped it;  Tried Crestor 20mg /d but ?if filling this med regularly or not, asked to bring all bottles to each visit... ~  FLP 12/08 showed TChol 204, TG 93, HDL 67, LDL 106... On  Simva40/d. ~  FLP 9/09 showed TChol 183, TG 53, HDL 68, LDL 105... Contin same + diet. ~  FLP 8/10 showed TChol 224, TG 78, HDL 67, LDL 137... rec> take med regularly & work on diet. ~  FLP 5/11 showed TChol 187, TG 80, HDL 67, LDL 104 ~  8/11: she thinks the Simva40 makes her weak & she wants to put it on HOLD. ~  FLP off meds 3/12 showed TChol 293, TG 90, HDL 81, LDL 191.Marland KitchenMarland Kitchen  rec to restart Cres20. ~  FLP 7/12 ?on Cres20 showed TChol 163, TG 91, HDL 72, LDL 73 ~  She has since stopped the Crestor on her own (?making her weak). ~  1/13: Pt on diet alone & doesn't want Chol meds; not fasting today for FLP...  UNSPECIFIED DISORDER OF THYROID/ HYPOTHYROIDISM - she has a sl elevated TSH which we were following & finally started SYNTHROID 61mcg/d 3/12... ~  labs 9/09 showed TSH= 7.31 ~  labs 1/10 showed TSH= 5.40 ~  labs 8/10 showed TSH= 5.08... she does c/o being "cold" ~  labs 5/11 showed TSH= 6.11... she notes that her energy is "pretty fair" ~  labs 8/11 showed TSH= 0.73... wt= 116# & stable over the last 54mo. ~  Labs 3/12 showed TSH= 9.85... Rec> start SYNTHROID 47mcg/d. ~  Labs 7/12 on Levo75 showed TSH= 0.27... Keep same for now. ~  Labs 1/13 on Levo75 showed TSH= 4.68  GERD (ICD-530.81) - PPI changed to generic PREVACID 30mg /d... Known HH & GERD w/ EGD from [facility] 8/01 & 2/06 showing HH, GERD, erosions, & gastric polyp...  ~  EGD 12/08 w/ savory dilatation... she is encouraged to f/u w/ DrMagod if symptoms occur... ~  f/u EGD w/ dilatation 11/11 by [facility] for dysphagia symptoms showed tort esoph, sm HH, tiny gastric polyps, Savory dil done. ~  Yet another EGD & dilatation 1/13 by DrMagod; smHH, few tiny gastric polyps, Savory dilatation done...  CONSTIPATION (ICD-564.00) - Hx severe constipation w/ fecal impaction req hosp 2/03 & intermittently over the yrs... ~  colonoscopy 2/06 DrMagod w/ hemorrhoids & sm polyps... Encouraged to use MIRALAX daily... ~  [facility] 10/11 w/ constip & fecal  impaction> disimpacted & started back on MIRALAX & SENAKOT-S... ~  [facility] 1/13 w/ similar problem & we reviewed the importance of taking the Miralax & Senakot every day & monitoring her output w/ contingency meds (Mag citrate & Dulcolax) if needed... ~  2/13 OV: she reports improved w/ regular Miralax & prn Dulcolax; asked to restart the SENAKOT-S Qhs as well...  UTI (ICD-599.0) - Hx chronic UTI's Rx by Urology Joneen Boers w/ Macrodantin in the past... ~  8/11: Hosp by [facility] w/ UTI- sens EColi treated w/ Rocephin/ then Septra. ~  2/13: UTI w/ final C&S growing Staph & Enterococcus both sens to Levaquin & Tcn> Rx Levaquin 500mg /d + Align.  DEGENERATIVE JOINT DISEASE (ICD-715.90) - she uses Tylenol/ Advil as needed... ~  5/09:  she fell and broke her right wrist- s/p ORIF 6/09 by DrWeingold  DEGENERATIVE DISC DISEASE, CERVICAL SPINE (ICD-722.4) - CSpine surg by DrJenkins 1998...  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  ANEMIA (ICD-285.9), & VITAMIN B12 DEFICIENCY (ICD-266.2) - Hx mild anemia  ~  labs 6/08 showed Hg= 10.5 ~  labs 12/08 showed Hg= 12.3, Fe= 92 ~  labs 9/09 showed Hg= 11.8, MCV=89 ~  labs 1/10 showed Hg= 12.1, MCV= 89, Fe= 83, B12= 186... started on B12 shots monthly. ~  labs 8/10 showed Hg= 12.2, MCV= 92, Fe= 87, B12= 470... continue Rx. ~  labs 5/11 showed Hg= 11.2, MCV= 88 ~  labs 8/11 in hosp showed Hg= 10-11 range. ~  labs 10/11 in hosp showed Hg= 10-11 range... asked to start Femiron one tab daily. ~  Labs 3/12 showed Hg= 12.0, MCV= 90 ~  Labs 7/12 showed Hg= 11.1, MCV= 89, Fe= 87 (22%sat) ~  Labs 1/13 showed Hg=10.3, MCV=90, Fe=46 (13%sat), B12=635, SPE=neg (no monoclonal spike).  Hx SHINGLES - right T9-10 distrib 6/10 & treated w/ Famvir, Medrol, DCN100...   Past Surgical History  Procedure Date  . Tonsillectomy   . Total abdominal hysterectomy w/ bilateral salpingoophorectomy    . Cervical fusion   . Cataract extraction   . Nasal sinus surgery   . Esophagogastroduodenoscopy 09/10/2011    Procedure: ESOPHAGOGASTRODUODENOSCOPY (EGD);  Surgeon: Petra Kuba, MD;  Location: [facility] ENDOSCOPY;  Service: Endoscopy;  Laterality: N/A;  c-arm needed  . Savory dilation 09/10/2011    Procedure: SAVORY DILATION;  Surgeon: Petra Kuba, MD;  Location[facility] ENDOSCOPY;  Service: Endoscopy;  Laterality: N/A;    Outpatient Encounter Prescriptions as of 10/09/2011  Medication Sig Dispense Refill  . aspirin 81 MG tablet Take 81 mg by mouth daily.        Marland Kitchen b complex vitamins tablet Take 1 tablet by mouth daily.       . Cholecalciferol (VITAMIN D) 1000 UNITS capsule Take 1,000 Units by mouth daily.        . feeding supplement (ENSURE) PUDG Take 1 Container by mouth 2 (two) times daily as needed.      Marland Kitchen guaiFENesin (MUCINEX) 600 MG 12 hr tablet Take 1,200 mg by mouth 2 (two) times daily as needed. For cough.      . levothyroxine (SYNTHROID) 75 MCG tablet Take 1 tablet (75 mcg total) by mouth daily.  30 tablet  11  . Multiple Vitamin (MULTIVITAMINS PO) Take 1 tablet by mouth daily.       Marland Kitchen DISCONTD: feeding supplement (ENSURE) PUDG Take 1 Container by mouth 2 (two) times daily with a meal.      . DISCONTD: losartan (COZAAR) 50 MG tablet TAKE 1 TABLET BY MOUTH DAILY  30 tablet  6  . DISCONTD: pindolol (VISKEN) 5 MG tablet Take 1 tablet (5 mg total) by mouth 2 (two) times daily.  60 tablet  12  . DISCONTD: polyethylene glycol (MIRALAX / GLYCOLAX) packet Take 17 g by mouth daily. Hold for diarrhea  14 each    . DISCONTD: potassium chloride SA (K-DUR,KLOR-CON) 20 MEQ tablet TAKE 1 TABLET BY MOUTH EVERY DAY WITH FOOD  30 tablet  4  . DISCONTD: lansoprazole (PREVACID) 30 MG capsule Take 1 tablet by mouth two times a day       . DISCONTD: UNABLE TO FIND Incentive spirometry Use as directed  1 Device  0    No Known Allergies   Current Medications, Allergies, Past Medical History, Past Surgical  History, Family History, and Social History were reviewed in [employer] record.    Review of Systems        See HPI - all other systems neg except as noted...  The patient complains of weight loss, decreased hearing, dyspnea on exertion, headaches, severe indigestion/heartburn, muscle weakness, and difficulty walking.  The patient denies anorexia, fever, weight gain, vision loss, hoarseness, chest pain, syncope, peripheral edema, prolonged cough, hemoptysis, abdominal pain, melena, hematochezia, hematuria, incontinence, suspicious skin lesions, transient blindness, depression, unusual weight change, abnormal bleeding, enlarged lymph nodes, and angioedema.     Objective:   Physical Exam     WD, WN, 78 y/o WF in NAD... she is chr ill appearing... wt 112# GENERAL:  Alert & oriented; pleasant & cooperative... HEENT:  [place]/AT, EOM-wnl, PERRLA, EACs- sl wax, TMs-wnl, NOSE- nasal septal perf, THROAT- sl red +drainage... NECK:  Supple w/ decrROM; no JVD;  normal carotid impulses w/ 1+bruits; no thyromegaly or nodules palpated; no lymphadenopathy. CHEST:  Clear to P & A; without wheezes or rales, but scat rhonchi heard... HEART:  Regular Rhythm;  gr1/6 SEM without rubs or gallops detected... ABDOMEN:  Soft & nontender; normal bowel sounds; no organomegaly or masses palpated... EXT: without deformities, mild arthritic changes; no varicose veins/ +venous insuffic/ no edema. NEURO:  CN's intact;  no focal neuro deficits, she is weak diffusely... DERM: scarring in the right T9-10 distrib of her prev shingles eruption...   Assessment & Plan:   Hx Chronic Sinusitis>  Long hx chr sinus problems and perforated nasal septum; rec to use Mucinex 1-2 Bid + Fluids and nasal saline mist...  HBP>  BP meds adjusted freq but she has been non-compliant; pt asked to bring all med bottles to every visit; we decided upon LOSARTAN 50mg /d & PINDOLOL 5mg Bid==> BP is stable on this Rx.  Cerebrovasc  Dis>  She denies any cerebral ischemic symptoms; no postural BP changes; continue ASA daily...  Venous Insuffic>  Reminded to elim sodium, elevate legs, wear support hose...  CHOL>  She stopped the low dose Crestor also rec by [place]; for now she is on diet alone & she doesn't want cholesterol meds.  HYPOTHYROID>  On Synthroid 110mcg/d & labs are better- keep same for now...  GI> GERD, Dysphagia, Constip, recurrent impactions, etc>  rec to take PPI/ Prevacid regularly, and continue Miralax/ Senakot-S etc; reminded to take laxative regimen every day!!!  She had repeat EGD by [facility] 1/13 w/ another dilatation...  DJD, CSpine Disc dis, etc>  Stable on OTC Tylenol & occas Advil...  ANEMIA & B12 Defic>  She remains on Fe one tab daily & B12 shots every month...   Patient's Medications  New Prescriptions   CEPHALEXIN (KEFLEX) 500 MG CAPSULE    Take 1 capsule (500 mg total) by mouth 4 (four) times daily.  Previous Medications   ASPIRIN 81 MG TABLET    Take 81 mg by mouth daily.     B COMPLEX VITAMINS TABLET    Take 1 tablet by mouth daily.    CHOLECALCIFEROL (VITAMIN D) 1000 UNITS CAPSULE    Take 1,000 Units by mouth daily.     GUAIFENESIN (MUCINEX) 600 MG 12 HR TABLET    Take 1,200 mg by mouth 2 (two) times daily as needed. For cough.   LEVOTHYROXINE (SYNTHROID) 75 MCG TABLET    Take 1 tablet (75 mcg total) by mouth daily.   LOSARTAN (COZAAR) 50 MG TABLET    Take 50 mg by mouth daily.   MULTIPLE VITAMIN (MULTIVITAMINS PO)    Take 1 tablet by mouth daily.    POTASSIUM CHLORIDE SA (K-DUR,KLOR-CON) 20 MEQ TABLET    Take 20 mEq by mouth daily.  Modified Medications   Modified Medication Previous Medication   FEEDING SUPPLEMENT (ENSURE) PUDG feeding supplement (ENSURE) PUDG      Take 1 Container by mouth 2 (two) times daily as needed.    Take 1 Container by mouth 2 (two) times daily with a meal.   PINDOLOL (VISKEN) 5 MG TABLET pindolol (VISKEN) 5 MG tablet      Take 5 mg by mouth 2 (two) times  daily.    Take 1 tablet (5 mg total) by mouth 2 (two) times daily.  Discontinued Medications   LANSOPRAZOLE (PREVACID) 30 MG CAPSULE    Take 1 tablet by mouth two times a day    LOSARTAN (COZAAR) 50 MG TABLET  TAKE 1 TABLET BY MOUTH DAILY   POLYETHYLENE GLYCOL (MIRALAX / GLYCOLAX) PACKET    Take 17 g by mouth daily. Hold for diarrhea   POTASSIUM CHLORIDE SA (K-DUR,KLOR-CON) 20 MEQ TABLET    TAKE 1 TABLET BY MOUTH EVERY DAY WITH FOOD   UNABLE TO FIND    Incentive spirometry Use as directed

## 2011-10-15 ENCOUNTER — Other Ambulatory Visit: Payer: Self-pay | Admitting: Pulmonary Disease

## 2011-10-15 DIAGNOSIS — N390 Urinary tract infection, site not specified: Secondary | ICD-10-CM

## 2011-10-15 DIAGNOSIS — A499 Bacterial infection, unspecified: Secondary | ICD-10-CM

## 2011-10-15 DIAGNOSIS — E871 Hypo-osmolality and hyponatremia: Secondary | ICD-10-CM

## 2011-10-15 DIAGNOSIS — K5900 Constipation, unspecified: Secondary | ICD-10-CM

## 2011-10-15 MED ORDER — LEVOFLOXACIN 500 MG PO TABS: 500.0000 mg | ORAL_TABLET | Freq: Every day | ORAL | Status: AC

## 2011-10-15 NOTE — Progress Notes (Signed)
Notes Recorded by Michele Mcalpine, MD on 10/14/2011 at 6:01 PM Pt notified by Leigh... SMN Urine C&S w/ 2 germs> both sens to Levaquin... Call in [facility] 500mg  #10 one daily + Align one daily...   Orders only encounter created for levaquin rx.  ATC pt x2 - NA and no option to LM.  Result note in Leigh's box left open for this documentation.

## 2011-10-15 NOTE — ED Notes (Signed)
+   urine Patient treated with Keflex-sensitive to same-chart appended per protocol MD. 

## 2011-10-29 NOTE — ED Provider Notes (Signed)
I saw and evaluated the patient, reviewed the resident's note and I agree with the findings and plan.    Margaret Bucco, MD 10/29/11 [phone]

## 2011-11-05 ENCOUNTER — Other Ambulatory Visit: Payer: Self-pay | Admitting: Pulmonary Disease

## 2011-12-04 ENCOUNTER — Other Ambulatory Visit: Payer: Self-pay | Admitting: Pulmonary Disease

## 2011-12-10 ENCOUNTER — Ambulatory Visit (INDEPENDENT_AMBULATORY_CARE_PROVIDER_SITE_OTHER): Payer: Medicare Other | Admitting: Pulmonary Disease

## 2011-12-10 ENCOUNTER — Encounter: Payer: Self-pay | Admitting: Pulmonary Disease

## 2011-12-10 VITALS — BP 128/72 | HR 63 | Temp 96.8°F | Ht 64.0 in | Wt 118.6 lb

## 2011-12-10 DIAGNOSIS — M1990 Unspecified osteoarthritis, unspecified site: Secondary | ICD-10-CM

## 2011-12-10 DIAGNOSIS — M5030 Other cervical disc degeneration, unspecified cervical region: Secondary | ICD-10-CM

## 2011-12-10 DIAGNOSIS — F411 Generalized anxiety disorder: Secondary | ICD-10-CM

## 2011-12-10 DIAGNOSIS — I679 Cerebrovascular disease, unspecified: Secondary | ICD-10-CM

## 2011-12-10 DIAGNOSIS — E039 Hypothyroidism, unspecified: Secondary | ICD-10-CM

## 2011-12-10 DIAGNOSIS — N390 Urinary tract infection, site not specified: Secondary | ICD-10-CM

## 2011-12-10 DIAGNOSIS — K5900 Constipation, unspecified: Secondary | ICD-10-CM

## 2011-12-10 DIAGNOSIS — I872 Venous insufficiency (chronic) (peripheral): Secondary | ICD-10-CM

## 2011-12-10 DIAGNOSIS — D649 Anemia, unspecified: Secondary | ICD-10-CM

## 2011-12-10 DIAGNOSIS — K219 Gastro-esophageal reflux disease without esophagitis: Secondary | ICD-10-CM

## 2011-12-10 DIAGNOSIS — E7800 Pure hypercholesterolemia, unspecified: Secondary | ICD-10-CM

## 2011-12-10 DIAGNOSIS — E538 Deficiency of other specified B group vitamins: Secondary | ICD-10-CM

## 2011-12-10 DIAGNOSIS — I10 Essential (primary) hypertension: Secondary | ICD-10-CM

## 2011-12-10 NOTE — Progress Notes (Signed)
Subjective:    Patient ID: Margaret [name], female    DOB: [birth year], 78 y.o.   MRN: [id number]  HPI 78 y/o WF here for a follow up visit... She has Chr Sinusitis;  HBP;  ASPVD;  Chol;  GERD/ Constip w/ hx impaction;  UTIs;  DJD/ DDD;  Hx anxiety;  Hx anemia...   ~  July 17, 2010:  she was hosp 10/27 to 06/10/10 by [facility] w/ constipation & fecal impaction> her GI is DrMagod;  CT Abd showed norm GB, sm bilat renal cysts, prev hyst, rectal wall thickening c/w proctitis;  Labs OK x mild anemia w/ Hg~10, borderline hypothy;  she was disimpacted & re-started on her bowel regimen of MIRALAX & SENAKOT-S...  she is back to baseline but weak, wt down to 110# & asked to incr the supplements to Bid... chr sinus prob w/o change;  BP controlled & stable on meds;  denies cerebral ischemic symptoms;  see prob list below>>>  ~  November 06, 2010:  67mo ROV w/ review of mult chr problems & FASTING blood work today>  Review shows elev TSH= 9.85 & we will need to start her on Synthroid 100mcg/d...  ~  March 08, 2011:  67mo ROV & she feels as though she is "a little bit better" but still c/o weak & thinks one of her meds is keeping her up at night (doesn't know which one);  She stopped several meds including Mucinex, Losartan, Crestor, KCl, & Fe;  We decided to recheck labs> FLP looks good today ?is this on or off the Cres20 (Pharm says last filled #30 on 11/11/10)==> keep same & bring all med bottles to f/u visit; Chems= norm x Na=128; Hg=11.1, Fe= 87, B12= 418, TSH=0.27  ~  July 09, 2011:  67mo ROV & she brought all of her med bottles to the office today to review>>    Chr sinusitis>  Hx allergies & chr sinus problems w/ polyps & septal perf prev eval by DrShoemaker;  Currently only uses Mucinex & Saline prn...    HBP & pre-syncope>  She saw DrPRoss 10/12 after a spell at [employer] w/ near syncope; she adjusted her meds by stopping Cozaar50 & Amlodipine2.5, & starting PINDOLOL5Bid; Review of meds shows that she never stopped  Losartan or Norvasc, she did fill Visken5 but only taking one/d; BP= 132/70 w/ transient postural drop ==> Rec to stop Amlodipine, continue Cozaar50, incr Pindolol5Bid...     Cerebrovasc dis>  On ASA 81mg /d;  sm vessel dis on prev MRI & faint bilat CBruits w/ hx neg CDopplers in the past...    Chol>  Prev on Simva40 but stopped by the pt 8/11 due to "weak"; we tried to get her on Crestor & so did DrRoss but pt stopped this too (?made her weak)...    GERD>  On Prevacid 30mg /d, known HH & followed by [facility] for GI w/ dilatation 11/11 & swallowing better...    Constip>  Hx severe constip w/ hosp for impaction 2003;  On Miralax daily & doing OK...    DJD>  Can't take NSAIDs well, uses Tylenol & prn Advil;  C/o some left ankle pain on & off...    Anxiety>  Not on meds...    Anemia & B12 defic>  Takes Fe daily & ?B12 shots monthly;  Labs today showed Hg=12.0, MCV=90...  ~  September 05, 2011:  34mo ROV & post hosp> Adm 08/29/11 w/ adb pain, fecal impaction, worsening dysphagia, UTI, HBP &  chronic hyponatremia==> improved after fluids, laxatives, antibiotics, etc; MBS showed dysphagia & she is on a liq diet==> she has f/u w/ DrMagod soon for EGD & dilatation...  See Prob List below>>  ~  October 09, 2011:  84mo ROV> she reports doing fine, having good BMs, & no new complaints or concerns today...   BP controlled on Cozaar & Visken, 142/64 today, denies CP/ palpit/ ch in SOB/ edema...  She had EGD w/ Savory dilatation from her gastroenterologist [facility] 09/10/11> essentially neg EGD (sm HH, few tiny gastric polyps) & dilated w/o difficulty; states swallowing is improved, OK now...  Denies constip now on good regimen w/ Miralax daily & Dulcolax tabs/ suppos as needed...  Other problems as noted>>  ~  December 10, 2011:  25mo ROV & f/u from ER 2/13> seen in ER w/ abd pain, loose stool & N/V;  Exam was neg w/o abd tenderness or distention but rectal showed large hard stool burden in rectum;  Labs showed Hg=10.3, Na=125,  UA was +UTI;  XRay showed mod stool burden in colon;  She was given dose of Rocephin, suppository, asked to incr Miralax to Bid...  I note that for all her trouble w/ chronic constipation she just doesn't get it & won't take regular dosing of Miralax, Senakot-S, dulcolax etc; she could not afford & would not fill the Amitiza Rx or Linzess...  See prob list below>>    She reports "everything is OK now" & when pressed she admitted to not taking regular meds for her chr constipation> advised to restart MIRALAX Daily Qam & SENAKOT-S Qhs... She tells me she fell at church several weeks ago, no serious injury & didn't go to the ER but this launched [place] into a discussion about her status>> Lives alone in her own home, does some housework, takes her own meds ?OK, daughter visits 2-3x per week, son lives in [place], son in law does her yard work, sister drives her to appts, she says she is safe & does ok at home, she has no intention of looking into AL etc...   Problem List:    << PROBLEM LIST UPDATED 12/10/11 >>  SINUSITIS, CHRONIC (ICD-473.9) - Long Hx chronic sinus problems - Eval & Rx by DrShoemaker w/ hx epistaxis, nasal septal erosion w/ prev "button";  and nasal polyps... she uses Saline, Mucinex, etc... she notes mild chr cough from the drainage- we reviewed chronic nasal regimen & rec MUCINEX DM 2Bid...  HYPERTENSION (ICD-401.9) >> currently controlled on LOSARTAN 50mg /d & PINDOLOL 5mg Bid... ~  Baseline EKG w/ NSR, WNL... ~  2D ECHO 2001 was normal... ~  CXR 11/07 w/ Biapical pl thickening, NAD... CXR 9/09 showed COPD w/ incr markings, NAD... f/u CXR 5/11 showed COPD, NAD...  ~  11/10: c/o weak in AM- decision to decr Amlodipine to 5mg /d & BP has been stable on this. ~  8/11: Hosp by [facility] w/ "presyncope" & meds were adjusted but she restarted prev Rx on discharge> we decided to stop Diovan/Hct in favor of LOSARTAN 50mg /d (?if she ever even filled it- reminded to bring all med bottles to every visit). ~   7/12: BP today= 120/70, no postural changes, ?on Amlodip5 & Losartan50... she denies HA, visual symptoms, CP, palpit, change in SOB, edema, etc... ~  10/12: Near syncope at [employer]> saw DrRoss w/ attempted meds changes (asked to stop Amlodip/Losartan & start Pindolol5Bid) but she decided to take all 3... ~  11/12: BP today= 132/70 w/ transient postural drop; asked  to stop Amlodip, continue LOSARTAN50, take VISKEN5Bid... ~  1/13: BP= 118/72 on same meds & feeling better since disch... ~  2/13: BP= 142/64 on Losar50 & Pindolol5Bid, continue same; 1 view CXR 2/13 showed COPD, biapical scarring, NAD... ~  4/13: BP= 128/72 w/o postural changes; she denies CP, palpit, ch in DOE, edema...  CEREBROVASCULAR DISEASE (ICD-437.9) - on ASA 81mg /d... Prev eval for syncope (related to postural BP changes) w/ MRI showing small vessel dz;  she has bilat CBruits w/ Carotid Dopplers in 2001 showing mild plaque, no signif ICA stenoses... ~  she denies cerebral ischemic symptoms on the ASA 81mg /d...  VENOUS INSUFFICIENCY (ICD-459.81) - on low sodium diet + support hose as needed.  HYPERCHOLESTEROLEMIA (ICD-272.0) - prev on Simva40 but she thinks this made her weak & she stopped it;  Tried Crestor 20mg /d but ?if filling this med regularly or not, asked to bring all bottles to each visit... ~  FLP 12/08 showed TChol 204, TG 93, HDL 67, LDL 106... On Simva40/d. ~  FLP 9/09 showed TChol 183, TG 53, HDL 68, LDL 105... Contin same + diet. ~  FLP 8/10 showed TChol 224, TG 78, HDL 67, LDL 137... rec> take med regularly & work on diet. ~  FLP 5/11 showed TChol 187, TG 80, HDL 67, LDL 104 ~  8/11: she thinks the Simva40 makes her weak & she wants to put it on HOLD. ~  FLP off meds 3/12 showed TChol 293, TG 90, HDL 81, LDL 191... rec to restart Cres20. ~  FLP 7/12 ?on Cres20 showed TChol 163, TG 91, HDL 72, LDL 73 ~  She has since stopped the Crestor on her own (?making her weak). ~  1/13 & 4/13: Pt on diet alone & doesn't want  Chol meds; not fasting today for FLP...  UNSPECIFIED DISORDER OF THYROID/ HYPOTHYROIDISM - she has a sl elevated TSH which we were following & finally started SYNTHROID 37mcg/d 3/12... ~  labs 9/09 showed TSH= 7.31 ~  labs 1/10 showed TSH= 5.40 ~  labs 8/10 showed TSH= 5.08... she does c/o being "cold" ~  labs 5/11 showed TSH= 6.11... she notes that her energy is "pretty fair" ~  labs 8/11 showed TSH= 0.73... wt= 116# & stable over the last 61mo. ~  Labs 3/12 showed TSH= 9.85... Rec> start SYNTHROID 13mcg/d. ~  Labs 7/12 on Levo75 showed TSH= 0.27... Keep same for now. ~  Labs 1/13 on Levo75 showed TSH= 4.68  GERD (ICD-530.81) - PPI changed to generic PREVACID 30mg /d... Known HH & GERD w/ EGD from [facility] 8/01 & 2/06 showing HH, GERD, erosions, & gastric polyp...  ~  EGD 12/08 w/ savory dilatation... she is encouraged to f/u w/ DrMagod if symptoms occur... ~  f/u EGD w/ dilatation 11/11 by [facility] for dysphagia symptoms showed tort esoph, sm HH, tiny gastric polyps, Savory dil done. ~  Yet another EGD & dilatation 1/13 by DrMagod; smHH, few tiny gastric polyps, Savory dilatation done...  CONSTIPATION (ICD-564.00) - Hx severe constipation w/ fecal impaction req hosp 2/03 & intermittently over the yrs... ~  colonoscopy 2/06 DrMagod w/ hemorrhoids & sm polyps... Encouraged to use MIRALAX daily... ~  [facility] 10/11 w/ constip & fecal impaction> disimpacted & started back on MIRALAX & SENAKOT-S... ~  [facility] 1/13 w/ similar problem & we reviewed the importance of taking the Miralax & Senakot every day & monitoring her output w/ contingency meds (Mag citrate & Dulcolax) if needed... ~  2/13 OV: she reports improved  w/ regular Miralax & prn Dulcolax; asked to restart the SENAKOT-S Qhs as well...  UTI (ICD-599.0) - Hx chronic UTI's Rx by Urology Joneen Boers w/ Macrodantin in the past... ~  8/11: Hosp by [facility] w/ UTI- sens EColi treated w/ Rocephin/ then Septra. ~  2/13: UTI w/ final C&S growing Staph &  Enterococcus both sens to Levaquin & Tcn> Rx Levaquin 500mg /d + Align.  DEGENERATIVE JOINT DISEASE (ICD-715.90) - she uses Tylenol/ Advil as needed... ~  5/09:  she fell and broke her right wrist- s/p ORIF 6/09 by DrWeingold  DEGENERATIVE DISC DISEASE, CERVICAL SPINE (ICD-722.4) - CSpine surg by DrJenkins 1998...  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  ANEMIA (ICD-285.9) >> she takes one IRON tab daily & B12 1077mcg/d... VITAMIN B12 DEFICIENCY (ICD-266.2) ~  labs 6/08 showed Hg= 10.5 ~  labs 12/08 showed Hg= 12.3, Fe= 92 ~  labs 9/09 showed Hg= 11.8, MCV=89 ~  labs 1/10 showed Hg= 12.1, MCV= 89, Fe= 83, B12= 186... started on B12 shots monthly. ~  labs 8/10 showed Hg= 12.2, MCV= 92, Fe= 87, B12= 470... continue Rx. ~  labs 5/11 showed Hg= 11.2, MCV= 88 ~  labs 8/11 in hosp showed Hg= 10-11 range; ?switched to Oral B12- 1070mcg/d... ~  labs 10/11 in hosp showed Hg= 10-11 range... asked to start Femiron one tab daily. ~  Labs 3/12 showed Hg= 12.0, MCV= 90 ~  Labs 7/12 showed Hg= 11.1, MCV= 89, Fe= 87 (22%sat) ~  Labs 1/13 showed Hg=10.3, MCV=90, Fe=46 (13%sat), B12=635, SPE=neg (no monoclonal spike).  Hx SHINGLES - right T9-10 distrib 6/10 & treated w/ Famvir, Medrol, [id number]...   Past Surgical History  Procedure Date  . Tonsillectomy   . Total abdominal hysterectomy w/ bilateral salpingoophorectomy   . Cervical fusion   . Cataract extraction   . Nasal sinus surgery   . Esophagogastroduodenoscopy 09/10/2011    Procedure: ESOPHAGOGASTRODUODENOSCOPY (EGD);  Surgeon: Petra Kuba, MD;  Location: [facility] ENDOSCOPY;  Service: Endoscopy;  Laterality: N/A;  c-arm needed  . Savory dilation 09/10/2011    Procedure: SAVORY DILATION;  Surgeon: Petra Kuba, MD;  Location: [facility] ENDOSCOPY;  Service: Endoscopy;  Laterality: N/A;    Outpatient Encounter Prescriptions as of 12/10/2011  Medication Sig Dispense  Refill  . aspirin 81 MG tablet Take 81 mg by mouth daily.        . cyanocobalamin 1000 MCG tablet Take 1000 mcg by mouth daily.      . feeding supplement (ENSURE) PUDG Take 1 Container by mouth daily.       Marland Kitchen guaiFENesin (MUCINEX) 600 MG 12 hr tablet Take 1,200 mg by mouth 2 (two) times daily as needed. For cough.      . lansoprazole (PREVACID) 30 MG capsule Take 30 mg by mouth 2 (two) times daily.      Marland Kitchen levothyroxine (SYNTHROID, LEVOTHROID) 75 MCG tablet TAKE 1 TABLET BY MOUTH DAILY  30 tablet  10  . losartan (COZAAR) 50 MG tablet Take 50 mg by mouth daily.      . pindolol (VISKEN) 5 MG tablet Take 5 mg by mouth 2 (two) times daily.      . potassium chloride SA (K-DUR,KLOR-CON) 20 MEQ tablet Take 20 mEq by mouth daily.      . vitamin D, CHOLECALCIFEROL, 400 UNITS tablet Take 400 Units by mouth daily.        No Known Allergies  Current Medications, Allergies, Past Medical History, Past Surgical History, Family History, and Social History were reviewed in [employer] record.    Review of Systems        See HPI - all other systems neg except as noted...  The patient complains of weight loss, decreased hearing, dyspnea on exertion, headaches, severe indigestion/heartburn, muscle weakness, and difficulty walking.  The patient denies anorexia, fever, weight gain, vision loss, hoarseness, chest pain, syncope, peripheral edema, prolonged cough, hemoptysis, abdominal pain, melena, hematochezia, hematuria, incontinence, suspicious skin lesions, transient blindness, depression, unusual weight change, abnormal bleeding, enlarged lymph nodes, and angioedema.     Objective:   Physical Exam     WD, WN, 78 y/o WF in NAD... she is chr ill appearing... wt 112# GENERAL:  Alert & oriented; pleasant & cooperative... HEENT:  /AT, EOM-wnl, PERRLA, EACs- sl wax, TMs-wnl, NOSE- nasal septal perf, THROAT- sl red +drainage... NECK:  Supple w/ decrROM; no JVD; normal carotid impulses  w/ 1+bruits; no thyromegaly or nodules palpated; no lymphadenopathy. CHEST:  Clear to P & A; without wheezes or rales, but scat rhonchi heard... HEART:  Regular Rhythm;  gr1/6 SEM without rubs or gallops detected... ABDOMEN:  Soft & nontender; normal bowel sounds; no organomegaly or masses palpated... EXT: without deformities, mild arthritic changes; no varicose veins/ +venous insuffic/ no edema. NEURO:  CN's intact;  no focal neuro deficits, she is weak diffusely... DERM: scarring in the right T9-10 distrib of her prev shingles eruption...   Assessment & Plan:   Hx Chronic Sinusitis>  Long hx chr sinus problems and perforated nasal septum; rec to use Mucinex 1-2 Bid + Fluids and nasal saline mist...  HBP>  BP meds adjusted freq but she has been non-compliant; pt asked to bring all med bottles to every visit; we decided upon LOSARTAN 50mg /d & PINDOLOL 5mg Bid==> BP is stable on this Rx.  Cerebrovasc Dis>  She denies any cerebral ischemic symptoms; no postural BP changes; continue ASA daily...  Venous Insuffic>  Reminded to elim sodium, elevate legs, wear support hose...  CHOL>  She stopped the low dose Crestor also rec by [place]; for now she is on diet alone & she doesn't want cholesterol meds.  HYPOTHYROID>  On Synthroid 17mcg/d & labs are better- keep same for now...  GI> GERD, Dysphagia, Constip, recurrent impactions, etc>  rec to take PPI/ Prevacid regularly, and continue Miralax/ Senakot-S etc; reminded to take laxative regimen every day!!!  She had repeat EGD by [facility] 1/13 w/ another dilatation...  DJD, CSpine Disc dis, etc>  Stable on OTC Tylenol & occas Advil...  ANEMIA & B12 Defic>  She remains on Fe one tab daily & B12 orally as well...   Patient's Medications  New Prescriptions   No medications on file  Previous Medications   ASPIRIN 81 MG TABLET    Take 81 mg by mouth daily.     CYANOCOBALAMIN 1000 MCG TABLET    Take 1,000 mcg by mouth daily.    FEEDING SUPPLEMENT  (ENSURE) PUDG    Take 1 Container by mouth daily.    GUAIFENESIN (MUCINEX) 600 MG 12 HR TABLET    Take 1,200 mg by mouth 2 (two) times daily as needed. For cough.   LANSOPRAZOLE (PREVACID) 30 MG CAPSULE    Take 30 mg by mouth 2 (two) times daily.   LEVOTHYROXINE (SYNTHROID, LEVOTHROID) 75 MCG TABLET    TAKE 1 TABLET BY MOUTH DAILY   LOSARTAN (COZAAR) 50 MG TABLET  Take 50 mg by mouth daily.   PINDOLOL (VISKEN) 5 MG TABLET    Take 5 mg by mouth 2 (two) times daily.   POTASSIUM CHLORIDE SA (K-DUR,KLOR-CON) 20 MEQ TABLET    Take 20 mEq by mouth daily.   VITAMIN D, CHOLECALCIFEROL, 400 UNITS TABLET    Take 400 Units by mouth daily.  Modified Medications   No medications on file  Discontinued Medications   B COMPLEX VITAMINS TABLET    Take 1 tablet by mouth daily.    CEPHALEXIN (KEFLEX) 500 MG CAPSULE    Take 1 capsule (500 mg total) by mouth 4 (four) times daily.   CHOLECALCIFEROL (VITAMIN D) 1000 UNITS CAPSULE    Take 1,000 Units by mouth daily.     MULTIPLE VITAMIN (MULTIVITAMINS PO)    Take 1 tablet by mouth daily.

## 2011-12-10 NOTE — Patient Instructions (Signed)
Today we updated your med list in our EPIC system...    Continue your current medications the same...  For your bowels: Remember to take the [facility]- one capful mixed in water each AM; and the SENAKOT-S one tab at bedtime...  Stay as active as possible, but BE CAREFUL...  Call for any questions...  Let's plan a follow up visit w/ lab work in 3 months.Marland KitchenMarland Kitchen

## 2012-02-06 ENCOUNTER — Other Ambulatory Visit: Payer: Self-pay | Admitting: Pulmonary Disease

## 2012-03-10 ENCOUNTER — Encounter: Payer: Self-pay | Admitting: Pulmonary Disease

## 2012-03-10 ENCOUNTER — Ambulatory Visit (INDEPENDENT_AMBULATORY_CARE_PROVIDER_SITE_OTHER): Payer: Medicare Other | Admitting: Pulmonary Disease

## 2012-03-10 VITALS — BP 118/72 | HR 67 | Temp 96.9°F | Ht 64.0 in | Wt 112.6 lb

## 2012-03-10 DIAGNOSIS — E7800 Pure hypercholesterolemia, unspecified: Secondary | ICD-10-CM

## 2012-03-10 DIAGNOSIS — K219 Gastro-esophageal reflux disease without esophagitis: Secondary | ICD-10-CM

## 2012-03-10 DIAGNOSIS — I872 Venous insufficiency (chronic) (peripheral): Secondary | ICD-10-CM

## 2012-03-10 DIAGNOSIS — M1990 Unspecified osteoarthritis, unspecified site: Secondary | ICD-10-CM

## 2012-03-10 DIAGNOSIS — F411 Generalized anxiety disorder: Secondary | ICD-10-CM

## 2012-03-10 DIAGNOSIS — I679 Cerebrovascular disease, unspecified: Secondary | ICD-10-CM

## 2012-03-10 DIAGNOSIS — K5900 Constipation, unspecified: Secondary | ICD-10-CM

## 2012-03-10 DIAGNOSIS — E039 Hypothyroidism, unspecified: Secondary | ICD-10-CM

## 2012-03-10 DIAGNOSIS — D649 Anemia, unspecified: Secondary | ICD-10-CM

## 2012-03-10 DIAGNOSIS — I10 Essential (primary) hypertension: Secondary | ICD-10-CM

## 2012-03-10 DIAGNOSIS — M5030 Other cervical disc degeneration, unspecified cervical region: Secondary | ICD-10-CM

## 2012-03-10 NOTE — Patient Instructions (Addendum)
Today we updated your med list in our EPIC system...    Continue your current medications the same...  Remember to take [facility]- one capful in water each AM... And SENAKOT-S one tab at bedtime daily...    You can get both of these over the counter at the drug store...  Stay as active as possible, and BE CAREFUL...  Call for any questions...  Let's plan a follow up visit in 4 months.Marland KitchenMarland Kitchen

## 2012-03-10 NOTE — Progress Notes (Signed)
Subjective:    Patient ID: Margaret [name], female    DOB: [birth year], 78 y.o.   MRN: [id number]  HPI 78 y/o WF here for a follow up visit... She has Chr Sinusitis;  HBP;  ASPVD;  Chol;  GERD/ Constip w/ hx impaction;  UTIs;  DJD/ DDD;  Hx anxiety;  Hx anemia...   ~  July 17, 2010:  she was hosp 10/27 to 06/10/10 by [facility] w/ constipation & fecal impaction> her GI is DrMagod;  CT Abd showed norm GB, sm bilat renal cysts, prev hyst, rectal wall thickening c/w proctitis;  Labs OK x mild anemia w/ Hg~10, borderline hypothy;  she was disimpacted & re-started on her bowel regimen of MIRALAX & SENAKOT-S...  she is back to baseline but weak, wt down to 110# & asked to incr the supplements to Bid... chr sinus prob w/o change;  BP controlled & stable on meds;  denies cerebral ischemic symptoms;  see prob list below>>>  ~  November 06, 2010:  67mo ROV w/ review of mult chr problems & FASTING blood work today>  Review shows elev TSH= 9.85 & we will need to start her on Synthroid 100mcg/d...  ~  March 08, 2011:  67mo ROV & she feels as though she is "a little bit better" but still c/o weak & thinks one of her meds is keeping her up at night (doesn't know which one);  She stopped several meds including Mucinex, Losartan, Crestor, KCl, & Fe;  We decided to recheck labs> FLP looks good today ?is this on or off the Cres20 (Pharm says last filled #30 on 11/11/10)==> keep same & bring all med bottles to f/u visit; Chems= norm x Na=128; Hg=11.1, Fe= 87, B12= 418, TSH=0.27  ~  July 09, 2011:  67mo ROV & she brought all of her med bottles to the office today to review>>    Chr sinusitis>  Hx allergies & chr sinus problems w/ polyps & septal perf prev eval by DrShoemaker;  Currently only uses Mucinex & Saline prn...    HBP & pre-syncope>  She saw DrPRoss 10/12 after a spell at [employer] w/ near syncope; she adjusted her meds by stopping Cozaar50 & Amlodipine2.5, & starting PINDOLOL5Bid; Review of meds shows that she never stopped  Losartan or Norvasc, she did fill Visken5 but only taking one/d; BP= 132/70 w/ transient postural drop ==> Rec to stop Amlodipine, continue Cozaar50, incr Pindolol5Bid...     Cerebrovasc dis>  On ASA 81mg /d;  sm vessel dis on prev MRI & faint bilat CBruits w/ hx neg CDopplers in the past...    Chol>  Prev on Simva40 but stopped by the pt 8/11 due to "weak"; we tried to get her on Crestor & so did DrRoss but pt stopped this too (?made her weak)...    GERD>  On Prevacid 30mg /d, known HH & followed by [facility] for GI w/ dilatation 11/11 & swallowing better...    Constip>  Hx severe constip w/ hosp for impaction 2003;  On Miralax daily & doing OK...    DJD>  Can't take NSAIDs well, uses Tylenol & prn Advil;  C/o some left ankle pain on & off...    Anxiety>  Not on meds...    Anemia & B12 defic>  Takes Fe daily & ?B12 shots monthly;  Labs today showed Hg=12.0, MCV=90...  ~  September 05, 2011:  34mo ROV & post hosp> Adm 08/29/11 w/ adb pain, fecal impaction, worsening dysphagia, UTI, HBP &  chronic hyponatremia==> improved after fluids, laxatives, antibiotics, etc; MBS showed dysphagia & she is on a liq diet==> she has f/u w/ DrMagod soon for EGD & dilatation...  See Prob List below>>  ~  October 09, 2011:  66mo ROV> she reports doing fine, having good BMs, & no new complaints or concerns today...   BP controlled on Cozaar & Visken, 142/64 today, denies CP/ palpit/ ch in SOB/ edema...  She had EGD w/ Savory dilatation from her gastroenterologist [facility] 09/10/11> essentially neg EGD (sm HH, few tiny gastric polyps) & dilated w/o difficulty; states swallowing is improved, OK now...  Denies constip now on good regimen w/ Miralax daily & Dulcolax tabs/ suppos as needed...  Other problems as noted>>  ~  December 10, 2011:  56mo ROV & f/u from ER 2/13> seen in ER w/ abd pain, loose stool & N/V;  Exam was neg w/o abd tenderness or distention but rectal showed large hard stool burden in rectum;  Labs showed Hg=10.3, Na=125,  UA was +UTI;  XRay showed mod stool burden in colon;  She was given dose of Rocephin, suppository, asked to incr Miralax to Bid...  I note that for all her trouble w/ chronic constipation she just doesn't get it & won't take regular dosing of Miralax, Senakot-S, dulcolax etc; she could not afford & would not fill the Amitiza Rx or Linzess...  See prob list below>>    She reports "everything is OK now" & when pressed she admitted to not taking regular meds for her chr constipation> advised to restart MIRALAX Daily Qam & SENAKOT-S Qhs... She tells me she fell at church several weeks ago, no serious injury & didn't go to the ER but this launched [place] into a discussion about her status>> Lives alone in her own home, does some housework, takes her own meds ?OK, daughter visits 2-3x per week, son lives in [place], son in law does her yard work, sister drives her to appts, she says she is safe & does ok at home, she has no intention of looking into AL etc...  ~  March 10, 2012:  61mo ROV & Margaret [name] has a number of chronic complaints> feels weak, not able to walk far, food doesn't taste good, some minor bruising noted, etc; states "I'm doing pretty good on my own at home" & she refuses to make other plans for the future...     As noted she has had a signif prob w/ constipation & XRays have consistently shown large stool burden; she seems incapable of self monitoring BMs/ constip/ etc; she has only been taking 1 tablesp of Miralax daily & once again asked to take 1 capful of Miralax daily along w/ 1-2 Senakot-S at bedtime!!!    We reviewed prob list, meds, xrays and labs> see below for updates >>   Problem List:      SINUSITIS, CHRONIC (ICD-473.9) - Long Hx chronic sinus problems - Eval & Rx by DrShoemaker w/ hx epistaxis, nasal septal erosion w/ prev "button";  and nasal polyps... she uses Saline, Mucinex, etc... she notes mild chr cough from the drainage- we reviewed chronic nasal regimen & rec MUCINEX DM  2Bid...  HYPERTENSION (ICD-401.9) >> currently controlled on LOSARTAN 50mg /d & PINDOLOL 5mg Bid... ~  Baseline EKG w/ NSR, WNL... ~  2D ECHO 2001 was normal... ~  CXR 11/07 w/ Biapical pl thickening, NAD... CXR 9/09 showed COPD w/ incr markings, NAD... f/u CXR 5/11 showed COPD, NAD...  ~  11/10: c/o weak  in AM- decision to decr Amlodipine to 5mg /d & BP has been stable on this. ~  8/11: Hosp by [facility] w/ "presyncope" & meds were adjusted but she restarted prev Rx on discharge> we decided to stop Diovan/Hct in favor of LOSARTAN 50mg /d (?if she ever even filled it- reminded to bring all med bottles to every visit). ~  7/12: BP today= 120/70, no postural changes, ?on Amlodip5 & Losartan50... she denies HA, visual symptoms, CP, palpit, change in SOB, edema, etc... ~  10/12: Near syncope at [employer]> saw DrRoss w/ attempted meds changes (asked to stop Amlodip/Losartan & start Pindolol5Bid) but she decided to take all 3... ~  11/12: BP today= 132/70 w/ transient postural drop; asked to stop Amlodip, continue LOSARTAN50, take VISKEN5Bid... ~  1/13: BP= 118/72 on same meds & feeling better since disch... ~  2/13: BP= 142/64 on Losar50 & Pindolol5Bid, continue same; 1 view CXR 2/13 showed COPD, biapical scarring, NAD... ~  4/13: BP= 128/72 w/o postural changes; she denies CP, palpit, ch in DOE, edema... ~  7/13:  BP= 118/72 & she remains asymptomatic...  CEREBROVASCULAR DISEASE (ICD-437.9) - on ASA 81mg /d... Prev eval for syncope (related to postural BP changes) w/ MRI showing small vessel dz;  she has bilat CBruits w/ Carotid Dopplers in 2001 showing mild plaque, no signif ICA stenoses... ~  she denies cerebral ischemic symptoms on the ASA 81mg /d...  VENOUS INSUFFICIENCY (ICD-459.81) - on low sodium diet + support hose as needed.  HYPERCHOLESTEROLEMIA (ICD-272.0) - prev on Simva40 but she thinks this made her weak & she stopped it;  Tried Crestor 20mg /d but ?if filling this med regularly or not, asked to bring all  bottles to each visit... ~  FLP 12/08 showed TChol 204, TG 93, HDL 67, LDL 106... On Simva40/d. ~  FLP 9/09 showed TChol 183, TG 53, HDL 68, LDL 105... Contin same + diet. ~  FLP 8/10 showed TChol 224, TG 78, HDL 67, LDL 137... rec> take med regularly & work on diet. ~  FLP 5/11 showed TChol 187, TG 80, HDL 67, LDL 104 ~  8/11: she thinks the Simva40 makes her weak & she wants to put it on HOLD. ~  FLP off meds 3/12 showed TChol 293, TG 90, HDL 81, LDL 191... rec to restart Cres20. ~  FLP 7/12 ?on Cres20 showed TChol 163, TG 91, HDL 72, LDL 73 ~  She has since stopped the Crestor on her own (?making her weak). ~  1/13 & 4/13: Pt on diet alone & doesn't want Chol meds; not fasting today for FLP...  UNSPECIFIED DISORDER OF THYROID/ HYPOTHYROIDISM - she has a sl elevated TSH which we were following & finally started SYNTHROID 63mcg/d 3/12... ~  labs 9/09 showed TSH= 7.31 ~  labs 1/10 showed TSH= 5.40 ~  labs 8/10 showed TSH= 5.08... she does c/o being "cold" ~  labs 5/11 showed TSH= 6.11... she notes that her energy is "pretty fair" ~  labs 8/11 showed TSH= 0.73... wt= 116# & stable over the last 110mo. ~  Labs 3/12 showed TSH= 9.85... Rec> start SYNTHROID 48mcg/d. ~  Labs 7/12 on Levo75 showed TSH= 0.27... Keep same for now. ~  Labs 1/13 on Levo75 showed TSH= 4.68  GERD (ICD-530.81) - PPI changed to generic PREVACID 30mg /d... Known HH & GERD w/ EGD from [facility] 8/01 & 2/06 showing HH, GERD, erosions, & gastric polyp...  ~  EGD 12/08 w/ savory dilatation... she is encouraged to f/u w/ DrMagod if symptoms  occur... ~  f/u EGD w/ dilatation 11/11 by [facility] for dysphagia symptoms showed tort esoph, sm HH, tiny gastric polyps, Savory dil done. ~  Yet another EGD & dilatation 1/13 by DrMagod; smHH, few tiny gastric polyps, Savory dilatation done...  CONSTIPATION (ICD-564.00) - Hx severe constipation w/ fecal impaction req hosp 2/03 & intermittently over the yrs... ~  colonoscopy 2/06 DrMagod w/  hemorrhoids & sm polyps... Encouraged to use MIRALAX daily... ~  [facility] 10/11 w/ constip & fecal impaction> disimpacted & started back on MIRALAX & SENAKOT-S... ~  [facility] 1/13 w/ similar problem & we reviewed the importance of taking the Miralax & Senakot every day & monitoring her output w/ contingency meds (Mag citrate & Dulcolax) if needed... ~  2/13 OV: she reports improved w/ regular Miralax & prn Dulcolax; asked to restart the SENAKOT-S Qhs as well...  UTI (ICD-599.0) - Hx chronic UTI's Rx by Urology Joneen Boers w/ Macrodantin in the past... ~  8/11: Hosp by [facility] w/ UTI- sens EColi treated w/ Rocephin/ then Septra. ~  2/13: UTI w/ final C&S growing Staph & Enterococcus both sens to Levaquin & Tcn> Rx Levaquin 500mg /d + Align.  DEGENERATIVE JOINT DISEASE (ICD-715.90) - she uses Tylenol/ Advil as needed... ~  5/09:  she fell and broke her right wrist- s/p ORIF 6/09 by DrWeingold  DEGENERATIVE DISC DISEASE, CERVICAL SPINE (ICD-722.4) - CSpine surg by DrJenkins 1998...  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  ANEMIA (ICD-285.9) >> she takes one IRON tab daily & B12 1040mcg/d... VITAMIN B12 DEFICIENCY (ICD-266.2) ~  labs 6/08 showed Hg= 10.5 ~  labs 12/08 showed Hg= 12.3, Fe= 92 ~  labs 9/09 showed Hg= 11.8, MCV=89 ~  labs 1/10 showed Hg= 12.1, MCV= 89, Fe= 83, B12= 186... started on B12 shots monthly. ~  labs 8/10 showed Hg= 12.2, MCV= 92, Fe= 87, B12= 470... continue Rx. ~  labs 5/11 showed Hg= 11.2, MCV= 88 ~  labs 8/11 in hosp showed Hg= 10-11 range; ?switched to Oral B12- 1097mcg/d... ~  labs 10/11 in hosp showed Hg= 10-11 range... asked to start Femiron one tab daily. ~  Labs 3/12 showed Hg= 12.0, MCV= 90 ~  Labs 7/12 showed Hg= 11.1, MCV= 89, Fe= 87 (22%sat) ~  Labs 1/13 showed Hg=10.3, MCV=90, Fe=46 (13%sat), B12=635, SPE=neg (no monoclonal spike).  Hx SHINGLES - right T9-10 distrib 6/10 &  treated w/ Famvir, Medrol, [id number]...   Past Surgical History  Procedure Date  . Tonsillectomy   . Total abdominal hysterectomy w/ bilateral salpingoophorectomy   . Cervical fusion   . Cataract extraction   . Nasal sinus surgery   . Esophagogastroduodenoscopy 09/10/2011    Procedure: ESOPHAGOGASTRODUODENOSCOPY (EGD);  Surgeon: Petra Kuba, MD;  Location[facility] ENDOSCOPY;  Service: Endoscopy;  Laterality: N/A;  c-arm needed  . Savory dilation 09/10/2011    Procedure: SAVORY DILATION;  Surgeon: Petra Kuba, MD;  Location: [facility] ENDOSCOPY;  Service: Endoscopy;  Laterality: N/A;    Outpatient Encounter Prescriptions as of 03/10/2012  Medication Sig Dispense Refill  . aspirin 81 MG tablet Take 81 mg by mouth daily.        . cyanocobalamin 1000 MCG tablet Take 1,000 mcg by mouth daily.       . lansoprazole (PREVACID) 30 MG capsule Take 30 mg by mouth daily.       Marland Kitchen levothyroxine (SYNTHROID, LEVOTHROID) 75 MCG tablet TAKE 1 TABLET  BY MOUTH DAILY  30 tablet  10  . losartan (COZAAR) 50 MG tablet Take 50 mg by mouth daily.      . pindolol (VISKEN) 5 MG tablet Take 5 mg by mouth 2 (two) times daily.      . potassium chloride SA (K-DUR,KLOR-CON) 20 MEQ tablet TAKE 1 TABLET BY MOUTH EVERY DAY WITH FOOD  30 tablet  4  . vitamin D, CHOLECALCIFEROL, 400 UNITS tablet Take 400 Units by mouth daily.      . feeding supplement (ENSURE) PUDG Take 1 Container by mouth daily.       Marland Kitchen guaiFENesin (MUCINEX) 600 MG 12 hr tablet Take 1,200 mg by mouth 2 (two) times daily as needed. For cough.      . DISCONTD: potassium chloride SA (K-DUR,KLOR-CON) 20 MEQ tablet Take 20 mEq by mouth daily.        No Known Allergies   Current Medications, Allergies, Past Medical History, Past Surgical History, Family History, and Social History were reviewed in [employer] record.    Review of Systems        See HPI - all other systems neg except as noted...  The patient complains of weight loss, decreased  hearing, dyspnea on exertion, headaches, severe indigestion/heartburn, muscle weakness, and difficulty walking.  The patient denies anorexia, fever, weight gain, vision loss, hoarseness, chest pain, syncope, peripheral edema, prolonged cough, hemoptysis, abdominal pain, melena, hematochezia, hematuria, incontinence, suspicious skin lesions, transient blindness, depression, unusual weight change, abnormal bleeding, enlarged lymph nodes, and angioedema.     Objective:   Physical Exam     WD, WN, 78 y/o WF in NAD... she is chr ill appearing... wt 112# GENERAL:  Alert & oriented; pleasant & cooperative... HEENT:  [place]/AT, EOM-wnl, PERRLA, EACs- sl wax, TMs-wnl, NOSE- nasal septal perf, THROAT- sl red +drainage... NECK:  Supple w/ decrROM; no JVD; normal carotid impulses w/ 1+bruits; no thyromegaly or nodules palpated; no lymphadenopathy. CHEST:  Clear to P & A; without wheezes or rales, but scat rhonchi heard... HEART:  Regular Rhythm;  gr1/6 SEM without rubs or gallops detected... ABDOMEN:  Soft & nontender; normal bowel sounds; no organomegaly or masses palpated... EXT: without deformities, mild arthritic changes; no varicose veins/ +venous insuffic/ no edema. NEURO:  CN's intact;  no focal neuro deficits, she is weak diffusely... DERM: scarring in the right T9-10 distrib of her prev shingles eruption...  RADIOLOGY DATA:  Reviewed in the EPIC EMR & discussed w/ the patient...  LABORATORY DATA:  Reviewed in the EPIC EMR & discussed w/ the patient...   Assessment & Plan:   Hx Chronic Sinusitis>  Long hx chr sinus problems and perforated nasal septum; rec to use Mucinex 1-2 Bid + Fluids and nasal saline mist...  HBP>  BP meds adjusted freq but she has been non-compliant; pt asked to bring all med bottles to every visit; we decided upon LOSARTAN 50mg /d & PINDOLOL 5mg Bid==> BP is stable on this Rx.  Cerebrovasc Dis>  She denies any cerebral ischemic symptoms; no postural BP changes; continue ASA  daily...  Venous Insuffic>  Reminded to elim sodium, elevate legs, wear support hose...  CHOL>  She stopped the low dose Crestor also rec by [place]; for now she is on diet alone & she doesn't want cholesterol meds.  HYPOTHYROID>  On Synthroid 7mcg/d & labs are better- keep same for now...  GI> GERD, Dysphagia, Constip, recurrent impactions, etc>  rec to take PPI/ Prevacid regularly, and continue Miralax/ Senakot-S etc;  reminded to take laxative regimen every day!!!  She had repeat EGD by [facility] 1/13 w/ another dilatation...  DJD, CSpine Disc dis, etc>  Stable on OTC Tylenol & occas Advil...  ANEMIA & B12 Defic>  She remains on Fe one tab daily & B12 orally as well...   Patient's Medications  New Prescriptions   No medications on file  Previous Medications   ASPIRIN 81 MG TABLET    Take 81 mg by mouth daily.     CYANOCOBALAMIN 1000 MCG TABLET    Take 1,000 mcg by mouth daily.    FEEDING SUPPLEMENT (ENSURE) PUDG    Take 1 Container by mouth daily.    GUAIFENESIN (MUCINEX) 600 MG 12 HR TABLET    Take 1,200 mg by mouth 2 (two) times daily as needed. For cough.   LANSOPRAZOLE (PREVACID) 30 MG CAPSULE    Take 30 mg by mouth daily.    LEVOTHYROXINE (SYNTHROID, LEVOTHROID) 75 MCG TABLET    TAKE 1 TABLET BY MOUTH DAILY   LOSARTAN (COZAAR) 50 MG TABLET    Take 50 mg by mouth daily.   PINDOLOL (VISKEN) 5 MG TABLET    Take 5 mg by mouth 2 (two) times daily.   POTASSIUM CHLORIDE SA (K-DUR,KLOR-CON) 20 MEQ TABLET    TAKE 1 TABLET BY MOUTH EVERY DAY WITH FOOD   VITAMIN D, CHOLECALCIFEROL, 400 UNITS TABLET    Take 400 Units by mouth daily.  Modified Medications   No medications on file  Discontinued Medications   POTASSIUM CHLORIDE SA (K-DUR,KLOR-CON) 20 MEQ TABLET    Take 20 mEq by mouth daily.

## 2012-04-14 ENCOUNTER — Encounter (HOSPITAL_COMMUNITY): Payer: Self-pay | Admitting: *Deleted

## 2012-04-14 ENCOUNTER — Emergency Department (HOSPITAL_COMMUNITY)
Admission: EM | Admit: 2012-04-14 | Discharge: 2012-04-14 | Disposition: A | Discharge status: Home / Self Care | Payer: Medicare Other | Attending: Emergency Medicine | Admitting: Emergency Medicine

## 2012-04-14 DIAGNOSIS — R1110 Vomiting, unspecified: Secondary | ICD-10-CM | POA: Insufficient documentation

## 2012-04-14 DIAGNOSIS — R109 Unspecified abdominal pain: Secondary | ICD-10-CM | POA: Insufficient documentation

## 2012-04-14 DIAGNOSIS — K5641 Fecal impaction: Secondary | ICD-10-CM | POA: Insufficient documentation

## 2012-04-14 NOTE — ED Notes (Signed)
To ED for eval of abd pain and constipation. Pt to ED with family. Family states pt is usually admitted for this. Last BM 3-4 days ago. Pt has taken an entire of suppositories without relief. Pt uses Mirolax daily. States she had very watery stool this am. Bowel Sounds present in all for quads.

## 2012-04-14 NOTE — ED Notes (Signed)
Pt decided to leave she has waited too long.  Daughter with her very angry

## 2012-04-14 NOTE — ED Notes (Signed)
Pt no longer wanting to wait.

## 2012-04-16 ENCOUNTER — Inpatient Hospital Stay (HOSPITAL_COMMUNITY)
Admission: EM | Admit: 2012-04-16 | Discharge: 2012-04-28 | DRG: 644 | Disposition: A | Discharge status: Skilled Nursing Facility | Payer: Medicare Other | Attending: Family Medicine | Admitting: Family Medicine

## 2012-04-16 ENCOUNTER — Emergency Department (HOSPITAL_COMMUNITY): Payer: Medicare Other

## 2012-04-16 ENCOUNTER — Encounter (HOSPITAL_COMMUNITY): Payer: Self-pay | Admitting: Emergency Medicine

## 2012-04-16 DIAGNOSIS — I10 Essential (primary) hypertension: Secondary | ICD-10-CM | POA: Diagnosis present

## 2012-04-16 DIAGNOSIS — J479 Bronchiectasis, uncomplicated: Secondary | ICD-10-CM | POA: Diagnosis present

## 2012-04-16 DIAGNOSIS — N390 Urinary tract infection, site not specified: Secondary | ICD-10-CM | POA: Diagnosis not present

## 2012-04-16 DIAGNOSIS — I872 Venous insufficiency (chronic) (peripheral): Secondary | ICD-10-CM

## 2012-04-16 DIAGNOSIS — D649 Anemia, unspecified: Secondary | ICD-10-CM | POA: Diagnosis present

## 2012-04-16 DIAGNOSIS — R636 Underweight: Secondary | ICD-10-CM | POA: Diagnosis present

## 2012-04-16 DIAGNOSIS — J4489 Other specified chronic obstructive pulmonary disease: Secondary | ICD-10-CM

## 2012-04-16 DIAGNOSIS — E039 Hypothyroidism, unspecified: Secondary | ICD-10-CM | POA: Diagnosis present

## 2012-04-16 DIAGNOSIS — R748 Abnormal levels of other serum enzymes: Secondary | ICD-10-CM

## 2012-04-16 DIAGNOSIS — R627 Adult failure to thrive: Secondary | ICD-10-CM | POA: Diagnosis present

## 2012-04-16 DIAGNOSIS — J329 Chronic sinusitis, unspecified: Secondary | ICD-10-CM

## 2012-04-16 DIAGNOSIS — Z66 Do not resuscitate: Secondary | ICD-10-CM | POA: Diagnosis not present

## 2012-04-16 DIAGNOSIS — Z79899 Other long term (current) drug therapy: Secondary | ICD-10-CM

## 2012-04-16 DIAGNOSIS — S62109A Fracture of unspecified carpal bone, unspecified wrist, initial encounter for closed fracture: Secondary | ICD-10-CM

## 2012-04-16 DIAGNOSIS — R64 Cachexia: Secondary | ICD-10-CM | POA: Diagnosis present

## 2012-04-16 DIAGNOSIS — E871 Hypo-osmolality and hyponatremia: Secondary | ICD-10-CM

## 2012-04-16 DIAGNOSIS — E876 Hypokalemia: Secondary | ICD-10-CM | POA: Diagnosis not present

## 2012-04-16 DIAGNOSIS — R109 Unspecified abdominal pain: Secondary | ICD-10-CM

## 2012-04-16 DIAGNOSIS — I679 Cerebrovascular disease, unspecified: Secondary | ICD-10-CM

## 2012-04-16 DIAGNOSIS — Z23 Encounter for immunization: Secondary | ICD-10-CM

## 2012-04-16 DIAGNOSIS — B029 Zoster without complications: Secondary | ICD-10-CM

## 2012-04-16 DIAGNOSIS — F411 Generalized anxiety disorder: Secondary | ICD-10-CM | POA: Diagnosis present

## 2012-04-16 DIAGNOSIS — K219 Gastro-esophageal reflux disease without esophagitis: Secondary | ICD-10-CM | POA: Diagnosis present

## 2012-04-16 DIAGNOSIS — A498 Other bacterial infections of unspecified site: Secondary | ICD-10-CM | POA: Diagnosis not present

## 2012-04-16 DIAGNOSIS — K5909 Other constipation: Secondary | ICD-10-CM | POA: Diagnosis present

## 2012-04-16 DIAGNOSIS — J449 Chronic obstructive pulmonary disease, unspecified: Secondary | ICD-10-CM

## 2012-04-16 DIAGNOSIS — R918 Other nonspecific abnormal finding of lung field: Secondary | ICD-10-CM

## 2012-04-16 DIAGNOSIS — Z981 Arthrodesis status: Secondary | ICD-10-CM

## 2012-04-16 DIAGNOSIS — M5030 Other cervical disc degeneration, unspecified cervical region: Secondary | ICD-10-CM

## 2012-04-16 DIAGNOSIS — M1990 Unspecified osteoarthritis, unspecified site: Secondary | ICD-10-CM | POA: Diagnosis present

## 2012-04-16 DIAGNOSIS — L501 Idiopathic urticaria: Secondary | ICD-10-CM

## 2012-04-16 DIAGNOSIS — K5900 Constipation, unspecified: Secondary | ICD-10-CM

## 2012-04-16 DIAGNOSIS — R0902 Hypoxemia: Secondary | ICD-10-CM

## 2012-04-16 DIAGNOSIS — E538 Deficiency of other specified B group vitamins: Secondary | ICD-10-CM

## 2012-04-16 DIAGNOSIS — E7800 Pure hypercholesterolemia, unspecified: Secondary | ICD-10-CM | POA: Diagnosis present

## 2012-04-16 DIAGNOSIS — E079 Disorder of thyroid, unspecified: Secondary | ICD-10-CM

## 2012-04-16 DIAGNOSIS — K6289 Other specified diseases of anus and rectum: Secondary | ICD-10-CM | POA: Diagnosis present

## 2012-04-16 DIAGNOSIS — R112 Nausea with vomiting, unspecified: Secondary | ICD-10-CM

## 2012-04-16 DIAGNOSIS — R1310 Dysphagia, unspecified: Secondary | ICD-10-CM

## 2012-04-16 DIAGNOSIS — R1312 Dysphagia, oropharyngeal phase: Secondary | ICD-10-CM | POA: Diagnosis present

## 2012-04-16 DIAGNOSIS — Z7982 Long term (current) use of aspirin: Secondary | ICD-10-CM

## 2012-04-16 DIAGNOSIS — R55 Syncope and collapse: Secondary | ICD-10-CM

## 2012-04-16 DIAGNOSIS — E236 Other disorders of pituitary gland: Principal | ICD-10-CM | POA: Diagnosis present

## 2012-04-16 LAB — CBC WITH DIFFERENTIAL/PLATELET
Eosinophils Absolute: 0 10*3/uL (ref 0.0–0.7)
HCT: 27.2 % — ABNORMAL LOW (ref 36.0–46.0)
Hemoglobin: 9.9 g/dL — ABNORMAL LOW (ref 12.0–15.0)
Lymphs Abs: 2 10*3/uL (ref 0.7–4.0)
MCH: 30.7 pg (ref 26.0–34.0)
MCV: 84.2 fL (ref 78.0–100.0)
Monocytes Absolute: 0.5 10*3/uL (ref 0.1–1.0)
Monocytes Relative: 9 % (ref 3–12)
Neutrophils Relative %: 49 % (ref 43–77)
RBC: 3.23 MIL/uL — ABNORMAL LOW (ref 3.87–5.11)

## 2012-04-16 LAB — COMPREHENSIVE METABOLIC PANEL
ALT: 16 U/L (ref 0–35)
AST: 32 U/L (ref 0–37)
Albumin: 4.2 g/dL (ref 3.5–5.2)
Alkaline Phosphatase: 79 U/L (ref 39–117)
Calcium: 10.1 mg/dL (ref 8.4–10.5)
GFR calc Af Amer: >90 mL/min (ref >90)
Potassium: 4.3 mEq/L (ref 3.5–5.1)
Sodium: 119 mEq/L — CL (ref 135–145)
Total Protein: 7.7 g/dL (ref 6.0–8.3)

## 2012-04-16 MED ORDER — ONDANSETRON HCL 4 MG/2ML IJ SOLN
4.0000 mg | Freq: Once | INTRAMUSCULAR | Status: AC
  Administered 2012-04-16: 4 mg via INTRAVENOUS
  Filled 2012-04-16: qty 2

## 2012-04-16 MED ORDER — LORAZEPAM 2 MG/ML IJ SOLN: 1.0000 mg | Freq: Once | INTRAMUSCULAR | Status: DC

## 2012-04-16 MED ORDER — IOHEXOL 300 MG/ML  SOLN: 20.0000 mL | INTRAMUSCULAR | Status: AC

## 2012-04-16 MED ORDER — MINERAL OIL RE ENEM
1.0000 | ENEMA | Freq: Once | RECTAL | Status: DC
  Filled 2012-04-16: qty 1

## 2012-04-16 MED ORDER — SODIUM CHLORIDE 0.9 % IV BOLUS (SEPSIS): 500.0000 mL | Freq: Once | INTRAVENOUS | Status: DC

## 2012-04-16 MED ORDER — SODIUM CHLORIDE 0.9 % IV SOLN
20.0000 mL | INTRAVENOUS | Status: DC
  Administered 2012-04-16: 20 mL via INTRAVENOUS

## 2012-04-16 MED ORDER — MILK AND MOLASSES ENEMA
Freq: Once | RECTAL | Status: AC
  Administered 2012-04-17: 01:00:00 via RECTAL
  Filled 2012-04-16: qty 250

## 2012-04-16 MED ORDER — LORAZEPAM 2 MG/ML IJ SOLN
0.5000 mg | Freq: Once | INTRAMUSCULAR | Status: AC
  Administered 2012-04-16: 0.5 mg via INTRAVENOUS
  Filled 2012-04-16: qty 1

## 2012-04-16 NOTE — ED Provider Notes (Signed)
Patient with a hx sig for constipation and impaction was placed in CDU by Dr. Linwood Dibbles.  Patient is here for abdominal pain, constipation, nausea and vomiting and has received KUB, labs, and rectal exam.  Patient re-evaluated and is resting comfortable, VSS, with no new complaints or concerns at this time. Plan per previous provider is to given enema, obtain CT to evaluate for pancreatitis since the pt has an elevated Lipase.  On exam: hemodynamically stable, NAD, heart w/ RRR, lungs CTAB, Chest & abd non-tender, no peripheral edema or calf tenderness.      Lipase 91, hemoglobin 9.9, sodium 119, fecal occult blood negative. KUB with large stool volume, no evidence of bowel obstruction.  Milk and molasses enema ordered. Recheck BMP reevaluation of sodium.  Awaiting results of CT scan. If sodium remains low we will admit.  I discussed this patient with Dr. Rosalia Hammers and she will follow.  Dahlia Client Makara Lanzo, PA-C 04/17/12 0001

## 2012-04-16 NOTE — ED Notes (Addendum)
Pt reports being constipated for 4 days, has taken mineral oil, has taken dulcolax 2 days ago, drinks miralax daily; has been here before for same, and has received enema and it has always helped

## 2012-04-16 NOTE — ED Provider Notes (Addendum)
History   This chart was scribed for Celene Kras, MD by Charolett Bumpers . The patient was seen in room TR05C/TR05C. Patient's care was started at 1759.    CSN: [id number]  Arrival date & time 04/16/12  1518   First MD Initiated Contact with Patient 04/16/12 1759      Chief Complaint  Patient presents with  . Constipation     HPI Margaret [name] is a 78 y.o. female who presents to the Emergency Department complaining of constant, moderate constipation for the past 4 days. Daughter reports associated abdominal pain and vomiting 2 days ago Pt reports some improvement of her symptoms since yesterday. Denies any fevers. Daughter states that the pt has taken mineral oil, dulcolax 2 days ago with no relief. Daughter states that the pt has received an enema which usually works for her with no relief. Pt takes Miralax daily. Daughter reports a h/o impaction and constipation.   Past Medical History  Diagnosis Date  . Unspecified sinusitis (chronic)   . Unspecified essential hypertension   . Cerebrovascular disease, unspecified   . Unspecified venous (peripheral) insufficiency   . Pure hypercholesterolemia   . Unspecified disorder of thyroid   . Esophageal reflux   . Unspecified constipation   . Urinary tract infection, site not specified   . Osteoarthrosis, unspecified whether generalized or localized, unspecified site   . Degeneration of cervical intervertebral disc   . Closed fracture of other bone of wrist   . Anxiety state, unspecified   . Idiopathic urticaria   . Anemia, unspecified   . Other B-complex deficiencies   . Herpes zoster without mention of complication     Past Surgical History  Procedure Date  . Tonsillectomy   . Total abdominal hysterectomy w/ bilateral salpingoophorectomy   . Cervical fusion   . Cataract extraction   . Nasal sinus surgery   . Esophagogastroduodenoscopy 09/10/2011    Procedure: ESOPHAGOGASTRODUODENOSCOPY (EGD);  Surgeon: Petra Kuba, MD;   Location: [facility] ENDOSCOPY;  Service: Endoscopy;  Laterality: N/A;  c-arm needed  . Savory dilation 09/10/2011    Procedure: SAVORY DILATION;  Surgeon: Petra Kuba, MD;  Location: [facility] ENDOSCOPY;  Service: Endoscopy;  Laterality: N/A;    Family History  Problem Relation Age of Onset  . Diabetes Father   . Heart failure Mother   . Diabetes Sister     History  Substance Use Topics  . Smoking status: Never Smoker   . Smokeless tobacco: Never Used  . Alcohol Use: No    OB History    Grav Para Term Preterm Abortions TAB SAB Ect Mult Living                  Review of Systems  Constitutional: Negative for fever and chills.  Respiratory: Negative for shortness of breath.   Gastrointestinal: Positive for vomiting, abdominal pain and constipation. Negative for nausea.  Neurological: Negative for weakness.  All other systems reviewed and are negative.    Allergies  Aspirin  Home Medications   Current Outpatient Rx  Name Route Sig Dispense Refill  . ASPIRIN 81 MG PO TABS Oral Take 81 mg by mouth daily.      . CYANOCOBALAMIN 1000 MCG PO TABS Oral Take 1,000 mcg by mouth daily.     Marland Kitchen ENSURE PUDDING PO PUDG Oral Take 1 Container by mouth daily.     . GUAIFENESIN ER 600 MG PO TB12 Oral Take 1,200 mg by mouth 2 (two) times  daily as needed. For cough.    Marland Kitchen LEVOTHYROXINE SODIUM 75 MCG PO TABS Oral Take 75 mcg by mouth daily.    Marland Kitchen LOSARTAN POTASSIUM 50 MG PO TABS Oral Take 50 mg by mouth daily.    Marland Kitchen PINDOLOL 5 MG PO TABS Oral Take 5 mg by mouth 2 (two) times daily.    Marland Kitchen POLYETHYLENE GLYCOL 3350 PO POWD Oral Take 17 g by mouth daily.    Marland Kitchen VITAMIN D 400 UNITS PO TABS Oral Take 400 Units by mouth daily.    Marland Kitchen LANSOPRAZOLE 30 MG PO CPDR Oral Take 30 mg by mouth daily.       BP 138/60  Pulse 72  Temp 97.7 F (36.5 C) (Oral)  Resp 16  SpO2 99%  Physical Exam  Nursing note and vitals reviewed. Constitutional: No distress.       Frail, elderly.  HENT:  Head: Normocephalic and  atraumatic.  Right Ear: External ear normal.  Left Ear: External ear normal.  Eyes: Conjunctivae are normal. Right eye exhibits no discharge. Left eye exhibits no discharge. No scleral icterus.  Neck: Neck supple. No tracheal deviation present.  Cardiovascular: Normal rate, regular rhythm and intact distal pulses.   Pulmonary/Chest: Effort normal and breath sounds normal. No stridor. No respiratory distress. She has no wheezes. She has no rales.  Abdominal: Soft. Bowel sounds are normal. She exhibits no distension and no mass. There is no tenderness. There is no rebound and no guarding.  Musculoskeletal: She exhibits no edema and no tenderness.  Neurological: She is alert. She has normal strength. No sensory deficit. Cranial nerve deficit:  no gross defecits noted. She exhibits normal muscle tone. She displays no seizure activity. Coordination normal.  Skin: Skin is warm and dry. No rash noted.  Psychiatric: She has a normal mood and affect.    ED Course  Procedures (including critical care time)  DIAGNOSTIC STUDIES: Oxygen Saturation is 99% on room air, normal by my interpretation.    COORDINATION OF CARE:  18:20-Discussed planned course of treatment with the patient, who is agreeable at this time.     Labs Reviewed - No data to display Dg Abd 1 View  04/16/2012  *RADIOLOGY REPORT*  Clinical Data: Constipation  ABDOMEN - 1 VIEW  Comparison: 10/11/2011  Findings: Supine abdomen shows no gaseous bowel dilatation to suggest obstruction.  Air is seen scattered along the length of a nondilated colon.  There is some stool in the right colon and in the rectal vault.  Convex leftward lumbar scoliosis is evident.  IMPRESSION: No evidence for bowel obstruction.  Prominent stool volume noted in the rectum.   Original Report Authenticated By: ERIC A. MANSELL, M.D.       MDM  Patient presents to emergency room with complaints of recurrent constipation. She has tried Dulcolax minimal without  relief. In the past the patient has received enemas with relief. Patient removed to the CDU area for rectal exam and enema. Considering the fact that she did have some vomiting the other day  I will check a CBC and a cmet although I doubt she has obstruction at this time.   8:00 PM Pt with hyponatremia and elevated lipase.  She does have chronic low sodium but worse today.  Will plan on CT scan to evaluate further.  On exam pt does some soft stool in the rectal vault but no fecal impaction.  I personally performed the services described in this documentation, which was scribed in my presence.  The recorded information has been reviewed and considered.      Celene Kras, MD 04/16/12 2002

## 2012-04-17 ENCOUNTER — Inpatient Hospital Stay (HOSPITAL_COMMUNITY): Payer: Medicare Other

## 2012-04-17 ENCOUNTER — Encounter (HOSPITAL_COMMUNITY): Payer: Self-pay | Admitting: Radiology

## 2012-04-17 ENCOUNTER — Emergency Department (HOSPITAL_COMMUNITY): Payer: Medicare Other

## 2012-04-17 DIAGNOSIS — E039 Hypothyroidism, unspecified: Secondary | ICD-10-CM

## 2012-04-17 DIAGNOSIS — R109 Unspecified abdominal pain: Secondary | ICD-10-CM

## 2012-04-17 DIAGNOSIS — K6289 Other specified diseases of anus and rectum: Secondary | ICD-10-CM | POA: Diagnosis present

## 2012-04-17 DIAGNOSIS — I10 Essential (primary) hypertension: Secondary | ICD-10-CM

## 2012-04-17 DIAGNOSIS — K5900 Constipation, unspecified: Secondary | ICD-10-CM

## 2012-04-17 LAB — CBC WITH DIFFERENTIAL/PLATELET
Basophils Absolute: 0 10*3/uL (ref 0.0–0.1)
Basophils Relative: 0 % (ref 0–1)
Eosinophils Absolute: 0 10*3/uL (ref 0.0–0.7)
MCH: 29.9 pg (ref 26.0–34.0)
MCHC: 35.6 g/dL (ref 30.0–36.0)
Neutrophils Relative %: 51 % (ref 43–77)
Platelets: 172 10*3/uL (ref 150–400)
RDW: 14.3 % (ref 11.5–15.5)

## 2012-04-17 LAB — BASIC METABOLIC PANEL
BUN: 8 mg/dL (ref 6–23)
CO2: 27 mEq/L (ref 19–32)
Calcium: 9.7 mg/dL (ref 8.4–10.5)
Calcium: 9.2 mg/dL (ref 8.4–10.5)
GFR calc Af Amer: >90 mL/min (ref >90)
GFR calc non Af Amer: 86 mL/min — ABNORMAL LOW (ref >90)
Glucose, Bld: 97 mg/dL (ref 70–99)
Glucose, Bld: 86 mg/dL (ref 70–99)
Sodium: 122 mEq/L — ABNORMAL LOW (ref 135–145)
Sodium: 121 mEq/L — ABNORMAL LOW (ref 135–145)

## 2012-04-17 LAB — COMPREHENSIVE METABOLIC PANEL
ALT: 14 U/L (ref 0–35)
AST: 29 U/L (ref 0–37)
CO2: 29 mEq/L (ref 19–32)
Chloride: 88 mEq/L — ABNORMAL LOW (ref 96–112)
Creatinine, Ser: 0.56 mg/dL (ref 0.50–1.10)
GFR calc Af Amer: >90 mL/min (ref >90)
GFR calc non Af Amer: 82 mL/min — ABNORMAL LOW (ref >90)
Glucose, Bld: 93 mg/dL (ref 70–99)
Sodium: 125 mEq/L — ABNORMAL LOW (ref 135–145)
Total Bilirubin: 0.4 mg/dL (ref 0.3–1.2)

## 2012-04-17 LAB — URINALYSIS, ROUTINE W REFLEX MICROSCOPIC
Glucose, UA: NEGATIVE mg/dL
Protein, ur: NEGATIVE mg/dL

## 2012-04-17 LAB — URINE MICROSCOPIC-ADD ON

## 2012-04-17 LAB — LIPASE, BLOOD: Lipase: 43 U/L (ref 11–59)

## 2012-04-17 LAB — SODIUM, URINE, RANDOM: Sodium, Ur: 71 mEq/L

## 2012-04-17 LAB — TSH: TSH: 0.51 u[IU]/mL (ref 0.350–4.500)

## 2012-04-17 MED ORDER — GUAIFENESIN 100 MG/5ML PO SOLN
5.0000 mL | ORAL | Status: DC | PRN
  Filled 2012-04-17: qty 5

## 2012-04-17 MED ORDER — LEVOTHYROXINE SODIUM 75 MCG PO TABS
75.0000 ug | ORAL_TABLET | Freq: Every day | ORAL | Status: DC
  Administered 2012-04-17 – 2012-04-28 (×12): 75 ug via ORAL
  Filled 2012-04-17 (×13): qty 1

## 2012-04-17 MED ORDER — ONDANSETRON HCL 4 MG/2ML IJ SOLN: 4.0000 mg | Freq: Four times a day (QID) | INTRAMUSCULAR | Status: DC | PRN

## 2012-04-17 MED ORDER — ACETAMINOPHEN 650 MG RE SUPP: 650.0000 mg | Freq: Four times a day (QID) | RECTAL | Status: DC | PRN

## 2012-04-17 MED ORDER — ONDANSETRON HCL 4 MG PO TABS: 4.0000 mg | ORAL_TABLET | Freq: Four times a day (QID) | ORAL | Status: DC | PRN

## 2012-04-17 MED ORDER — SODIUM CHLORIDE 0.9 % IV SOLN: INTRAVENOUS | Status: DC

## 2012-04-17 MED ORDER — GUAIFENESIN ER 600 MG PO TB12
1200.0000 mg | ORAL_TABLET | Freq: Two times a day (BID) | ORAL | Status: DC
  Administered 2012-04-17: 1200 mg via ORAL
  Filled 2012-04-17 (×2): qty 2

## 2012-04-17 MED ORDER — CIPROFLOXACIN IN D5W 400 MG/200ML IV SOLN
400.0000 mg | Freq: Two times a day (BID) | INTRAVENOUS | Status: DC
  Administered 2012-04-17 – 2012-04-19 (×5): 400 mg via INTRAVENOUS
  Filled 2012-04-17 (×9): qty 200

## 2012-04-17 MED ORDER — LOSARTAN POTASSIUM 50 MG PO TABS
50.0000 mg | ORAL_TABLET | Freq: Every day | ORAL | Status: DC
  Administered 2012-04-17: 50 mg via ORAL
  Filled 2012-04-17: qty 1

## 2012-04-17 MED ORDER — PANTOPRAZOLE SODIUM 40 MG PO TBEC
40.0000 mg | DELAYED_RELEASE_TABLET | Freq: Every day | ORAL | Status: DC
  Administered 2012-04-17 – 2012-04-28 (×12): 40 mg via ORAL
  Filled 2012-04-17 (×12): qty 1

## 2012-04-17 MED ORDER — ADULT MULTIVITAMIN W/MINERALS CH
1.0000 | ORAL_TABLET | Freq: Every day | ORAL | Status: DC
  Administered 2012-04-17 – 2012-04-28 (×12): 1 via ORAL
  Filled 2012-04-17 (×12): qty 1

## 2012-04-17 MED ORDER — IOHEXOL 300 MG/ML  SOLN
80.0000 mL | Freq: Once | INTRAMUSCULAR | Status: AC | PRN
  Administered 2012-04-17: 80 mL via INTRAVENOUS

## 2012-04-17 MED ORDER — PINDOLOL 5 MG PO TABS
5.0000 mg | ORAL_TABLET | Freq: Two times a day (BID) | ORAL | Status: DC
  Administered 2012-04-17 – 2012-04-28 (×23): 5 mg via ORAL
  Filled 2012-04-17 (×24): qty 1

## 2012-04-17 MED ORDER — VITAMIN B-12 1000 MCG PO TABS
1000.0000 ug | ORAL_TABLET | Freq: Every day | ORAL | Status: DC
  Administered 2012-04-17 – 2012-04-28 (×12): 1000 ug via ORAL
  Filled 2012-04-17 (×12): qty 1

## 2012-04-17 MED ORDER — ENSURE COMPLETE PO LIQD
237.0000 mL | Freq: Two times a day (BID) | ORAL | Status: DC
  Administered 2012-04-17 – 2012-04-28 (×18): 237 mL via ORAL

## 2012-04-17 MED ORDER — METRONIDAZOLE IN NACL 5-0.79 MG/ML-% IV SOLN
500.0000 mg | Freq: Three times a day (TID) | INTRAVENOUS | Status: DC
  Administered 2012-04-17 – 2012-04-19 (×8): 500 mg via INTRAVENOUS
  Filled 2012-04-17 (×10): qty 100

## 2012-04-17 MED ORDER — ACETAMINOPHEN 325 MG PO TABS
650.0000 mg | ORAL_TABLET | Freq: Four times a day (QID) | ORAL | Status: DC | PRN
  Administered 2012-04-28: 650 mg via ORAL
  Filled 2012-04-17: qty 2

## 2012-04-17 NOTE — Progress Notes (Signed)
78yo female with chronic constipation c/o abdominal pain, admitted for elevated lipase as well as mild proctitis, to begin IV ABX.  Will begin Cipro 400mg  IV Q12H for CrCl ~72ml.min.  Vernard Gambles, PharmD, BCPS 04/17/2012 7:08 AM

## 2012-04-17 NOTE — Consult Note (Signed)
[facility] CONSULT NOTE    Date: 04/17/2012                  Patient Name:  Margaret [name]  MRN: [id number]  DOB: May 25, 1924  Age / Sex: 78 y.o., female         PCP: Michele Mcalpine, MD                 Service Requesting Consult: Int Med - Dr. Ashley Royalty                 Reason for Consult: Hyponatremia            History of Present Illness: Patient is a 78 y.o. female with a PMHx of HTN, GERD, hypothyroidism and anemia who was admitted to [facility] on 04/16/2012 for evaluation of abdominal pain.  She has h/o chronic constipation (for which she received an enema) and chronic hyponatremia. She presented to the ED after 3-4 days of abdominal pain, similar to pain she experienced in the past, which was also d/t constipation.  She notes that on the day of admission she did have 1 episode of nonbloody emesis after having a pancake.  No diarrhea, but does have loose stools surrounding constipated stool.  She reports good appetite but constant fatigue.    She lives alone and was accompanied by her daughter in the ED.  In the ED, lipase was mildly elevated, and has trended down.  CT abd/pelvis only showed proctitis.    Medications: Outpatient medications: Prescriptions prior to admission  Medication Sig Dispense Refill  . aspirin 81 MG tablet Take 81 mg by mouth daily.        . cyanocobalamin 1000 MCG tablet Take 1,000 mcg by mouth daily.       . feeding supplement (ENSURE) PUDG Take 1 Container by mouth daily.       Marland Kitchen guaiFENesin (MUCINEX) 600 MG 12 hr tablet Take 1,200 mg by mouth 2 (two) times daily as needed. For cough.      . levothyroxine (SYNTHROID, LEVOTHROID) 75 MCG tablet Take 75 mcg by mouth daily.      Marland Kitchen losartan (COZAAR) 50 MG tablet Take 50 mg by mouth daily.      . pindolol (VISKEN) 5 MG tablet Take 5 mg by mouth 2 (two) times daily.      . polyethylene glycol powder (GLYCOLAX/MIRALAX) powder Take 17 g by mouth daily.      . vitamin D, CHOLECALCIFEROL, 400 UNITS tablet  Take 400 Units by mouth daily.      . lansoprazole (PREVACID) 30 MG capsule Take 30 mg by mouth daily.         Current medications: Current Facility-Administered Medications  Medication Dose Route Frequency Provider Last Rate Last Dose  . acetaminophen (TYLENOL) tablet 650 mg  650 mg Oral Q6H PRN Eduard Clos, MD       Or  . acetaminophen (TYLENOL) suppository 650 mg  650 mg Rectal Q6H PRN Eduard Clos, MD      . ciprofloxacin (CIPRO) IVPB 400 mg  400 mg Intravenous Q12H Veronda Hollie Salk, PHARMD      . guaiFENesin (MUCINEX) 12 hr tablet 1,200 mg  1,200 mg Oral BID Eduard Clos, MD   1,200 mg at 04/17/12 0946  . iohexol (OMNIPAQUE) 300 MG/ML solution 20 mL  20 mL Oral Q1 Hr x 2 Medication Radiologist, MD      . iohexol (OMNIPAQUE) 300 MG/ML solution 80 mL  80 mL Intravenous Once PRN Medication Radiologist, MD   80 mL at 04/17/12 0038  . levothyroxine (SYNTHROID, LEVOTHROID) tablet 75 mcg  75 mcg Oral Q0600 Eduard Clos, MD   75 mcg at 04/17/12 0945  . LORazepam (ATIVAN) injection 0.5 mg  0.5 mg Intravenous Once Hannah Muthersbaugh, PA-C   0.5 mg at 04/16/12 2304  . losartan (COZAAR) tablet 50 mg  50 mg Oral Daily Eduard Clos, MD   50 mg at 04/17/12 0946  . metroNIDAZOLE (FLAGYL) IVPB 500 mg  500 mg Intravenous Q8H Eduard Clos, MD   500 mg at 04/17/12 0946  . milk and molasses enema   Rectal Once Hannah Muthersbaugh, PA-C      . ondansetron [facility]) injection 4 mg  4 mg Intravenous Once Celene Kras, MD   4 mg at 04/16/12 1848  . ondansetron (ZOFRAN) tablet 4 mg  4 mg Oral Q6H PRN Eduard Clos, MD       Or  . ondansetron [facility]) injection 4 mg  4 mg Intravenous Q6H PRN Eduard Clos, MD      . pantoprazole (PROTONIX) EC tablet 40 mg  40 mg Oral Q1200 Eduard Clos, MD      . pindolol (VISKEN) tablet 5 mg  5 mg Oral BID Eduard Clos, MD   5 mg at 04/17/12 0945  . vitamin B-12 (CYANOCOBALAMIN) tablet 1,000 mcg  1,000 mcg  Oral Daily Eduard Clos, MD   1,000 mcg at 04/17/12 0945  . DISCONTD: 0.9 %  sodium chloride infusion  20 mL Intravenous Continuous Celene Kras, MD 20 mL/hr at 04/16/12 1847 20 mL at 04/16/12 1847  . DISCONTD: 0.9 %  sodium chloride infusion   Intravenous Continuous Eduard Clos, MD      . DISCONTD: LORazepam (ATIVAN) injection 1 mg  1 mg Intravenous Once Hannah Muthersbaugh, PA-C      . DISCONTD: mineral oil enema 1 enema  1 enema Rectal Once Celene Kras, MD      . DISCONTD: sodium chloride 0.9 % bolus 500 mL  500 mL Intravenous Once Celene Kras, MD          Allergies: Allergies  Allergen Reactions  . Aspirin Hives    High Doses      Past Medical History: Past Medical History  Diagnosis Date  . Unspecified sinusitis (chronic)   . Unspecified essential hypertension   . Cerebrovascular disease, unspecified   . Unspecified venous (peripheral) insufficiency   . Pure hypercholesterolemia   . Unspecified disorder of thyroid   . Esophageal reflux   . Unspecified constipation   . Urinary tract infection, site not specified   . Osteoarthrosis, unspecified whether generalized or localized, unspecified site   . Degeneration of cervical intervertebral disc   . Closed fracture of other bone of wrist   . Anxiety state, unspecified   . Idiopathic urticaria   . Anemia, unspecified   . Other B-complex deficiencies   . Herpes zoster without mention of complication      Past Surgical History: Past Surgical History  Procedure Date  . Tonsillectomy   . Total abdominal hysterectomy w/ bilateral salpingoophorectomy   . Cervical fusion   . Cataract extraction   . Nasal sinus surgery   . Esophagogastroduodenoscopy 09/10/2011    Procedure: ESOPHAGOGASTRODUODENOSCOPY (EGD);  Surgeon: Petra Kuba, MD;  Location: [facility] ENDOSCOPY;  Service: Endoscopy;  Laterality: N/A;  c-arm needed  . Savory dilation 09/10/2011  Procedure: SAVORY DILATION;  Surgeon: Petra Kuba, MD;  Location: [facility]  ENDOSCOPY;  Service: Endoscopy;  Laterality: N/A;     Family History: Family History  Problem Relation Age of Onset  . Diabetes Father   . Heart failure Mother   . Diabetes Sister      Social History: History   Social History  . Marital Status: Widowed    Spouse Name: N/A    Number of Children: 3  . Years of Education: N/A   Occupational History  . Retired    Social History Main Topics  . Smoking status: Never Smoker   . Smokeless tobacco: Never Used  . Alcohol Use: No  . Drug Use: No  . Sexually Active: No   Other Topics Concern  . Not on file   Social History Narrative  . No narrative on file     Review of Systems: As per HPI  Vital Signs: Blood pressure 155/79, pulse 83, temperature 97.2 F (36.2 C), temperature source Oral, resp. rate 20, height 5\' 6"  (1.676 m), weight 109 lb 12.6 oz (49.8 kg), SpO2 96.00%.  Weight trends: Filed Weights   04/17/12 0617  Weight: 109 lb 12.6 oz (49.8 kg)    Physical Exam: General: Vital signs reviewed and noted. Well-developed, well-nourished, in no acute distress; alert, appropriate and cooperative throughout examination.  Head: Normocephalic, atraumatic.  Eyes: PERRL, EOMI, No signs of anemia or jaundince.  Nose: Mucous membranes moist, not inflammed, nonerythematous.  Throat: Oropharynx nonerythematous, no exudate appreciated.   Neck: No deformities, masses, or tenderness noted.Supple, No carotid Bruits, no JVD.  Lungs:  Normal respiratory effort. Clear to auscultation BL without crackles or wheezes.  Heart: RRR. S1 and S2 normal without gallop, murmur, or rubs.  Abdomen:  BS normoactive. Soft, Nondistended, non-tender.  No masses or organomegaly.  Extremities: No pretibial edema.  Neurologic: A&O X3, CN II - XII are grossly intact. Motor strength is 5/5 in the all 4 extremities, Sensations intact to light touch, Cerebellar signs negative.  Skin: No visible rashes, scars.    Lab results: Basic Metabolic  Panel:  Lab 04/17/12 0800 04/17/12 0037 04/16/12 1832  NA 125* 122* 119*  K 3.8 3.8 4.3  CL 88* 85* 83*  CO2 29 27 26   GLUCOSE 93 97 96  BUN 7 8 11   CREATININE 0.56 0.53 0.57  CALCIUM 9.6 9.7 10.1  MG -- -- --  PHOS -- -- --    Liver Function Tests:  Lab 04/17/12 0800 04/16/12 1832  AST 29 32  ALT 14 16  ALKPHOS 76 79  BILITOT 0.4 0.5  PROT 7.0 7.7  ALBUMIN 3.9 4.2    Lab 04/17/12 0800 04/16/12 1832  LIPASE 43 91*  AMYLASE -- --   No results found for this basename: AMMONIA:3 in the last 168 hours  CBC:  Lab 04/17/12 0800 04/16/12 1832  WBC 3.7* 4.9  NEUTROABS 1.9 2.4  HGB 10.0* 9.9*  HCT 28.1* 27.2*  MCV 84.1 84.2  PLT 172 189    Cardiac Enzymes:  Lab 04/17/12 0800  CKTOTAL --  CKMB --  CKMBINDEX --  TROPONINI <0.30    BNP: No components found with this basename: POCBNP:3  CBG: No results found for this basename: GLUCAP:5 in the last 168 hours  Microbiology: Urine culture for this admission pending  Coagulation Studies: No results found for this basename: LABPROT:3,INR:3 in the last 72 hours  Urinalysis: UA for this admission pending  Imaging: Dg Abd 1 View  04/16/2012  *RADIOLOGY REPORT*  Clinical Data: Constipation  ABDOMEN - 1 VIEW  Comparison: 10/11/2011  Findings: Supine abdomen shows no gaseous bowel dilatation to suggest obstruction.  Air is seen scattered along the length of a nondilated colon.  There is some stool in the right colon and in the rectal vault.  Convex leftward lumbar scoliosis is evident.  IMPRESSION: No evidence for bowel obstruction.  Prominent stool volume noted in the rectum.   Original Report Authenticated By: ERIC A. MANSELL, M.D.    Ct Abdomen Pelvis W Contrast  04/17/2012  *RADIOLOGY REPORT*  Clinical Data: Constipation for 4 days with abdominal pain.  CT ABDOMEN AND PELVIS WITH CONTRAST  Technique:  Multidetector CT imaging of the abdomen and pelvis was performed following the standard protocol during bolus  administration of intravenous contrast.  Contrast: 80mL OMNIPAQUE IOHEXOL 300 MG/ML  SOLN  Comparison: Radiographs 04/16/2012.  CT 06/09/2010.  Findings: There is mild atelectasis or scarring in the right middle lobe and lingula.  The liver, gallbladder and biliary system appear normal.  The pancreas is atrophied without focal abnormality.  The spleen and adrenal glands appear normal.  Small low-density renal lesions bilaterally are unchanged.  Most of these are cysts.  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  Bilateral extrarenal pelves are noted.  There is extensive aorto iliac and proximal visceral arterial atherosclerosis.  No enlarged abdominal pelvic lymph nodes are identified.  Urinary bladder is moderately distended.  There is no pelvic mass status post hysterectomy.  Perirectal soft tissue stranding is similar to the prior examination.  There is no focal fluid collection or other pelvic inflammatory process.  There is multilevel spondylosis inferiorly in the lumbar spine with a degenerative anterolisthesis at L4-L5.  Diffuse bulging of the disc contributes to severe spinal and biforaminal stenosis at that level.  There is asymmetric left hip arthropathy.  No acute osseous findings are seen.  IMPRESSION:  1.  Persistent or recurrent perirectal soft tissue stranding, possibly representing proctitis.  Correlate clinically. 2.  No evidence of bowel obstruction or other inflammatory process. 3.  Extensive atherosclerosis. 4.  Lower lumbar spondylosis with severe multifactorial spinal stenosis at L4-L5.   Original Report Authenticated By: Gerrianne Scale, M.D.      Assessment & Plan: Patient is a 78 y.o. female with a PMHx of HTN, GERD, hypothyroidism and anemia who was admitted to [facility] on 04/16/2012 for evaluation of abdominal pain.   #Hyponatremia: Sodium trending up (119 --> 125).  IVF are currently KVO.  500cc NS bolus never given, so no significant NS infusion  during hospitalization.  Suspect SIADH given response to fluid restriction.  No h/o significant diarrhea/vomiting - just one episode of emesis and what seems to be a mild obstructive diarrhea.  No evidence of liver or renal failure.  No echo on file, but no h/o heart failure, nor does she exhibit s/s of heart failure.  She does have h/o hypothyroidism.   No recent CXR to suggest lung mass, but CXR in 03/2010 & 12/2009 did not suggest nodule/mass/infiltrate, but did suggest emphysematous changes (denies h/o tobacco abuse).  No other electrolyte abnormalities. No hyperglycemia. Denies h/o EtOH. Albumin wnl, so less likely malnutrition, and she notes good appetite.  Calculated serum osm = 257.7  -f/u TSH & urine studies (in process) -random cortisol -CXR -orthostatic vital signs -hold any medications that may contribute to hyponatremia (will hold losartan 50mg  dialy) - suggest avoiding medications  such as NSAIDs, thiazides, and SSRIs that may exacerbate hyponatremia.  #Hypothyroidism: TSH pending, continue home dose levothyroxine per primary  #Normocytic Anemia: Hb at baseline.  FOBT in 04/2012 & 09/2011 were negative. B12 wnl on 09/05/11. No folate since 2010. Serum iron on 09/05/11 low normal & low saturation ratio. -Consider repeat b12, folate and ferritin/iron studies  #HTN: Blood pressure had been moderately elevated during hospitalization, but seems to be trending down, - per primary, continue pindolol   #Proctitis: Treated with Cipro/Flagyl per primary  DVT PPX - SCDs

## 2012-04-17 NOTE — Progress Notes (Signed)
UR complete.  CRoyal RN MPH [phone]

## 2012-04-17 NOTE — Consult Note (Signed)
I have seen and examined this patient and agree with the plan of care, euvolemic hyponatremia. Work up Urine osmolality and sodium. Cortisol and TSH and CXR. Stop losartan and avoid NSAID and HCTZ.  Yuval Rubens W 04/17/2012, 12:32 PM

## 2012-04-17 NOTE — Evaluation (Signed)
Physical Therapy Evaluation Patient Details Name: Margaret [name] MRN: [id number] DOB: [birth year] Today's Date: 04/17/2012 Time: 4098-1191 PT Time Calculation (min): 27 min  PT Assessment / Plan / Recommendation Clinical Impression  Pt. was admitted with abdominal pain, hyponatremia, HTN and proctitis.  She has generalized weakness, deconditioning and lacks endurance for functional tasks at this time.  She will benefit from acute followed by HHPT upon Dc.  Would recommend that she  have at least initial 24 hour care upon returning home, and she believes her sister would be willing to stay with her.      PT Assessment  Patient needs continued PT services    Follow Up Recommendations  Home health PT    Barriers to Discharge Decreased caregiver support lives alone although she reportssister will stay with her    Equipment Recommendations  Rolling walker with 5" wheels    Recommendations for Other Services OT consult   Frequency Min 3X/week    Precautions / Restrictions Precautions Precautions: Fall Restrictions Weight Bearing Restrictions: No   Pertinent Vitals/Pain No pain presently, no distress      Mobility  Bed Mobility Bed Mobility: Supine to Sit;Sit to Supine Supine to Sit: 6: Modified independent (Device/Increase time);HOB elevated;With rails Sit to Supine: 6: Modified independent (Device/Increase time);HOB flat Details for Bed Mobility Assistance: safety cues Transfers Transfers: Sit to Stand;Stand to Sit Sit to Stand: From bed;From chair/3-in-1;4: Min assist;With upper extremity assist Stand to Sit: 4: Min guard;With upper extremity assist;To bed;To chair/3-in-1 Details for Transfer Assistance: cues for safe technique Ambulation/Gait Ambulation/Gait Assistance: 4: Min assist;4: Min guard Ambulation Distance (Feet): 60 Feet Assistive device: None;Rolling walker Ambulation/Gait Assistance Details: Pt. initially ambulated without device, was unsteady and had several  corrective staggers..  With use of rolling walker, pt's gait more stable and without overt LOB.   Gait Pattern: Step-through pattern;Trunk flexed Gait velocity: slowed    Exercises     PT Diagnosis: Difficulty walking;Generalized weakness  PT Problem List: Decreased strength;Decreased activity tolerance;Decreased balance;Decreased mobility;Decreased knowledge of use of DME PT Treatment Interventions: Gait training;DME instruction;Functional mobility training;Stair training;Therapeutic activities;Therapeutic exercise;Balance training;Patient/family education   PT Goals Acute Rehab PT Goals PT Goal Formulation: With patient Time For Goal Achievement: 04/24/12 Potential to Achieve Goals: Good Pt will go Supine/Side to Sit: with modified independence PT Goal: Supine/Side to Sit - Progress: Goal set today Pt will go Sit to Supine/Side: with modified independence PT Goal: Sit to Supine/Side - Progress: Goal set today Pt will go Sit to Stand: with modified independence PT Goal: Sit to Stand - Progress: Goal set today Pt will go Stand to Sit: with modified independence PT Goal: Stand to Sit - Progress: Goal set today Pt will Transfer Bed to Chair/Chair to Bed: with modified independence PT Transfer Goal: Bed to Chair/Chair to Bed - Progress: Goal set today Pt will Ambulate: 51 - 150 feet;with least restrictive assistive device PT Goal: Ambulate - Progress: Goal set today Pt will Go Up / Down Stairs: 1-2 stairs;with min assist;with least restrictive assistive device  Visit Information  Last PT Received On: 04/17/12 Assistance Needed: +1    Subjective Data  Subjective: "I'm feeling a little better than wen I came in" Patient Stated Goal: return home caring for self, return to diriving   Prior Functioning  Home Living Lives With: Alone Available Help at Discharge: Available PRN/intermittently;Family;Available 24 hours/day;Other (Comment) (thinks sister could stay 24/7 initially) Type of  Home: House Home Access: Stairs to enter [employer] of Steps:  3 Entrance Stairs-Rails: Right;Left;Can reach both Home Layout: One level Bathroom Shower/Tub: Tub/shower unit;Curtain Firefighter: Standard Home Adaptive Equipment: Bedside commode/3-in-1;Quad cane Prior Function Level of Independence: Independent with assistive device(s) Able to Take Stairs?: Yes Driving: Yes (daytime, short distances) Vocation: Retired Musician: No difficulties Dominant Hand: Right    Cognition  Overall Cognitive Status: Appears within functional limits for tasks assessed/performed Arousal/Alertness: Awake/alert Orientation Level: Appears intact for tasks assessed Behavior During Session: [facility] for tasks performed    Extremity/Trunk Assessment Right Upper Extremity Assessment RUE ROM/Strength/Tone: [facility] for tasks assessed Left Upper Extremity Assessment LUE ROM/Strength/Tone: WFL for tasks assessed Right Lower Extremity Assessment RLE ROM/Strength/Tone: WFL for tasks assessed RLE Sensation: WFL - Light Touch Left Lower Extremity Assessment LLE ROM/Strength/Tone: WFL for tasks assessed LLE Sensation: WFL - Light Touch Trunk Assessment Trunk Assessment: Normal   Balance    End of Session PT - End of Session Equipment Utilized During Treatment: Gait belt Activity Tolerance: Patient tolerated treatment well Patient left: in bed;with call bell/phone within reach Nurse Communication: Mobility status  GP     Ferman Hamming 04/17/2012, 2:52 PM Weldon Picking PT Acute Rehab Services [phone] Beeper [phone]

## 2012-04-17 NOTE — Progress Notes (Signed)
INITIAL ADULT NUTRITION ASSESSMENT Date: 04/17/2012   Time: 1:37 PM  Reason for Assessment: Malnutrition Screening  INTERVENTION: 1. Ensure Complete po BID, each supplement provides 350 kcal and 13 grams of protein. 2. Downgrade diet to Dysphagia 3 given pt report of difficulty chewing with poor dentition 3. MVI 1 tab PO daily 4. RD to continue to follow nutrition care plan  DOCUMENTATION CODES Per approved criteria  -Not Applicable    ASSESSMENT: Female 78 y.o.  Dx: Abdominal pain  Hx:  Past Medical History  Diagnosis Date  . Unspecified sinusitis (chronic)   . Unspecified essential hypertension   . Cerebrovascular disease, unspecified   . Unspecified venous (peripheral) insufficiency   . Pure hypercholesterolemia   . Unspecified disorder of thyroid   . Esophageal reflux   . Unspecified constipation   . Urinary tract infection, site not specified   . Osteoarthrosis, unspecified whether generalized or localized, unspecified site   . Degeneration of cervical intervertebral disc   . Closed fracture of other bone of wrist   . Anxiety state, unspecified   . Idiopathic urticaria   . Anemia, unspecified   . Other B-complex deficiencies   . Herpes zoster without mention of complication    Past Surgical History  Procedure Date  . Tonsillectomy   . Total abdominal hysterectomy w/ bilateral salpingoophorectomy   . Cervical fusion   . Cataract extraction   . Nasal sinus surgery   . Esophagogastroduodenoscopy 09/10/2011    Procedure: ESOPHAGOGASTRODUODENOSCOPY (EGD);  Surgeon: Petra Kuba, MD;  Location: [facility] ENDOSCOPY;  Service: Endoscopy;  Laterality: N/A;  c-arm needed  . Savory dilation 09/10/2011    Procedure: SAVORY DILATION;  Surgeon: Petra Kuba, MD;  Location: [facility] ENDOSCOPY;  Service: Endoscopy;  Laterality: N/A;   Related Meds:     . ciprofloxacin  400 mg Intravenous Q12H  . guaiFENesin  1,200 mg Oral BID  . iohexol  20 mL Oral Q1 Hr x 2  . levothyroxine  75 mcg  Oral Q0600  . LORazepam  0.5 mg Intravenous Once  . metronidazole  500 mg Intravenous Q8H  . milk and molasses   Rectal Once  . ondansetron  4 mg Intravenous Once  . pantoprazole  40 mg Oral Q1200  . pindolol  5 mg Oral BID  . cyanocobalamin  1,000 mcg Oral Daily  . DISCONTD: LORazepam  1 mg Intravenous Once  . DISCONTD: losartan  50 mg Oral Daily  . DISCONTD: mineral oil  1 enema Rectal Once  . DISCONTD: sodium chloride  500 mL Intravenous Once   Ht: 5\' 6"  (167.6 cm)  Wt: 109 lb 12.6 oz (49.8 kg)  Ideal Wt: 59.1 kg % Ideal Wt: 84%  Wt Readings from Last 15 Encounters:  04/17/12 109 lb 12.6 oz (49.8 kg)  03/10/12 112 lb 9.6 oz (51.075 kg)  12/10/11 118 lb 9.6 oz (53.797 kg)  10/09/11 117 lb 3.2 oz (53.162 kg)  09/05/11 111 lb 12.8 oz (50.712 kg)  08/29/11 102 lb 1.2 oz (46.3 kg)  07/09/11 115 lb 9.6 oz (52.436 kg)  06/06/11 112 lb 12.8 oz (51.166 kg)  05/28/11 111 lb (50.349 kg)  05/24/11 109 lb (49.442 kg)  03/08/11 110 lb 12.8 oz (50.259 kg)  11/06/10 111 lb 12.8 oz (50.712 kg)  07/17/10 110 lb (49.896 kg)  04/11/10 116 lb 4 oz (52.731 kg)  01/04/10 117 lb (53.071 kg)  Usual Wt: 110 - 115 lb % Usual Wt: 99%  Body mass index is 17.72  kg/(m^2). Underweight.  Food/Nutrition Related Hx: pt states that she doesn't eat much at baseline  Labs:  CMP     Component Value Date/Time   NA 125* 04/17/2012 0800   K 3.8 04/17/2012 0800   CL 88* 04/17/2012 0800   CO2 29 04/17/2012 0800   GLUCOSE 93 04/17/2012 0800   BUN 7 04/17/2012 0800   CREATININE 0.56 04/17/2012 0800   CALCIUM 9.6 04/17/2012 0800   PROT 7.0 04/17/2012 0800   ALBUMIN 3.9 04/17/2012 0800   AST 29 04/17/2012 0800   ALT 14 04/17/2012 0800   ALKPHOS 76 04/17/2012 0800   BILITOT 0.4 04/17/2012 0800   GFRNONAA 82* 04/17/2012 0800   GFRAA >90 04/17/2012 0800    Intake/Output Summary (Last 24 hours) at 04/17/12 1337 Last data filed at 04/17/12 0842  Gross per 24 hour  Intake    240 ml  Output    125 ml  Net    115 ml   Diet  Order: General  Supplements/Tube Feeding: none  IVF:     DISCONTD: sodium chloride Last Rate: 20 mL (04/16/12 1847)  DISCONTD: sodium chloride    Estimated Nutritional Needs:   Kcal: 1235 - 1450 kcal Protein: 48 - 58 grams Fluid: 1.2 - 1.4 liters daily  Admitted with abdominal pain. Pt with consitpation, n/v. RD drawn to chart 2/2 Malnutrition Screening tool on admission. Pt states that she doesn't eat much at baseline. Discussed dietary recall:  Breakfast: a slice of toast or half a bowl of cereal daily Lunch: [employer] vegetable plate Dinner: [employer] vegetable plate  Pt states that she doesn't eat lots of meat 2/2 poor dentition. Also notes that intake was poor PTA when having constipation (3-4 days.) Suspect inadequate protein intake given this and dietary recall. No skin breakdown noted. Pt is at nutrition risk given likely suboptimal protein and calorie intake. Per epic wt hx, weights appear to be stable.  Pt states that she does drink Ensure at home. Agreeable to having it here.  NUTRITION DIAGNOSIS: -Inadequate oral intake (NI-2.1).  Status: Ongoing  RELATED TO: recent GI distress  AS EVIDENCE BY: pt report  MONITORING/EVALUATION(Goals): Goal: Pt to meet >/= 90% of their estimated nutrition needs Monitor: weight trends, lab trends, I/O's, PO intake, supplement tolerance  EDUCATION NEEDS: -No education needs identified at this time  Jarold Motto MS, RD, LDN Pager: [phone] After-hours pager: [phone]

## 2012-04-17 NOTE — Progress Notes (Signed)
Subjective: Pt alert and oriented x 3. States that she feels better today but has been feeling a bit week lately. She denies poor oral intake but admits to drinking about 2-3 glasses of water and approximately 1 glass of coke daily. Pt states that she lives alone but has the assistance of her sisters who spend most of the day together and her daughter. Her intention is to return home. Objective: Filed Vitals:   04/16/12 1522 04/16/12 2004 04/17/12 0617  BP: 138/60 176/82 147/70  Pulse: 72 77 81  Temp: 97.7 F (36.5 C)  97.3 F (36.3 C)  TempSrc: Oral  Oral  Resp: 16 20 18   Height:   5\' 6"  (1.676 m)  Weight:   49.8 kg (109 lb 12.6 oz)  SpO2: 99% 96% 99%   Weight change:  No intake or output data in the 24 hours ending 04/17/12 0840  General: Alert, awake, oriented x3, in no acute distress.  HEENT: [place]/AT PEERL, EOMI Neck: Trachea midline,  no masses, no thyromegal,y no JVD, no carotid bruit OROPHARYNX: Mucosa moist, No exudate/ erythema/lesions.  Heart: Regular rate and rhythm, without murmurs, rubs, gallops, PMI non-displaced, no heaves or thrills on palpation.  Lungs: Clear to auscultation, no wheezing or rhonchi noted. No increased vocal fremitus resonant to percussion  Abdomen: Soft, nontender, nondistended, positive bowel sounds, no masses no hepatosplenomegaly noted..  Neuro: No focal neurological deficits noted. Strength functional in bilateral upper and lower extremities. Musculoskeletal: No warm swelling or erythema around joints, no spinal tenderness noted. Psychiatric: Patient alert and oriented x3, good insight and cognition, good recent to remote recall.    Lab Results:  [facility] 04/17/12 0037 04/16/12 1832  NA 122* 119*  K 3.8 4.3  CL 85* 83*  CO2 27 26  GLUCOSE 97 96  BUN 8 11  CREATININE 0.53 0.57  CALCIUM 9.7 10.1  MG -- --  PHOS -- --    Basename 04/16/12 1832  AST 32  ALT 16  ALKPHOS 79  BILITOT 0.5  PROT 7.7  ALBUMIN 4.2    Basename 04/16/12  1832  LIPASE 91*  AMYLASE --    Basename 04/17/12 0800 04/16/12 1832  WBC 3.7* 4.9  NEUTROABS 1.9 2.4  HGB 10.0* 9.9*  HCT 28.1* 27.2*  MCV 84.1 84.2  PLT 172 189   No results found for this basename: CKTOTAL:3,CKMB:3,CKMBINDEX:3,TROPONINI:3 in the last 72 hours No components found with this basename: POCBNP:3 No results found for this basename: DDIMER:2 in the last 72 hours No results found for this basename: HGBA1C:2 in the last 72 hours No results found for this basename: CHOL:2,HDL:2,LDLCALC:2,TRIG:2,CHOLHDL:2,LDLDIRECT:2 in the last 72 hours No results found for this basename: TSH,T4TOTAL,FREET3,T3FREE,THYROIDAB in the last 72 hours No results found for this basename: VITAMINB12:2,FOLATE:2,FERRITIN:2,TIBC:2,IRON:2,RETICCTPCT:2 in the last 72 hours  Micro Results: No results found for this or any previous visit (from the past 240 hour(s)).  Studies/Results: Dg Abd 1 View  04/16/2012  *RADIOLOGY REPORT*  Clinical Data: Constipation  ABDOMEN - 1 VIEW  Comparison: 10/11/2011  Findings: Supine abdomen shows no gaseous bowel dilatation to suggest obstruction.  Air is seen scattered along the length of a nondilated colon.  There is some stool in the right colon and in the rectal vault.  Convex leftward lumbar scoliosis is evident.  IMPRESSION: No evidence for bowel obstruction.  Prominent stool volume noted in the rectum.   Original Report Authenticated By: ERIC A. MANSELL, M.D.    Ct Abdomen Pelvis W Contrast  04/17/2012  *RADIOLOGY  REPORT*  Clinical Data: Constipation for 4 days with abdominal pain.  CT ABDOMEN AND PELVIS WITH CONTRAST  Technique:  Multidetector CT imaging of the abdomen and pelvis was performed following the standard protocol during bolus administration of intravenous contrast.  Contrast: 80mL OMNIPAQUE IOHEXOL 300 MG/ML  SOLN  Comparison: Radiographs 04/16/2012.  CT 06/09/2010.  Findings: There is mild atelectasis or scarring in the right middle lobe and lingula.  The  liver, gallbladder and biliary system appear normal.  The pancreas is atrophied without focal abnormality.  The spleen and adrenal glands appear normal.  Small low-density renal lesions bilaterally are unchanged.  Most of these are cysts.  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  Bilateral extrarenal pelves are noted.  There is extensive aorto iliac and proximal visceral arterial atherosclerosis.  No enlarged abdominal pelvic lymph nodes are identified.  Urinary bladder is moderately distended.  There is no pelvic mass status post hysterectomy.  Perirectal soft tissue stranding is similar to the prior examination.  There is no focal fluid collection or other pelvic inflammatory process.  There is multilevel spondylosis inferiorly in the lumbar spine with a degenerative anterolisthesis at L4-L5.  Diffuse bulging of the disc contributes to severe spinal and biforaminal stenosis at that level.  There is asymmetric left hip arthropathy.  No acute osseous findings are seen.  IMPRESSION:  1.  Persistent or recurrent perirectal soft tissue stranding, possibly representing proctitis.  Correlate clinically. 2.  No evidence of bowel obstruction or other inflammatory process. 3.  Extensive atherosclerosis. 4.  Lower lumbar spondylosis with severe multifactorial spinal stenosis at L4-L5.   Original Report Authenticated By: Gerrianne Scale, M.D.     Medications: I have reviewed the patient's current medications. Scheduled Meds:   . ciprofloxacin  400 mg Intravenous Q12H  . guaiFENesin  1,200 mg Oral BID  . iohexol  20 mL Oral Q1 Hr x 2  . levothyroxine  75 mcg Oral Q0600  . LORazepam  0.5 mg Intravenous Once  . losartan  50 mg Oral Daily  . metronidazole  500 mg Intravenous Q8H  . milk and molasses   Rectal Once  . ondansetron  4 mg Intravenous Once  . pantoprazole  40 mg Oral Q1200  . pindolol  5 mg Oral BID  . cyanocobalamin  1,000 mcg Oral Daily  . DISCONTD:  LORazepam  1 mg Intravenous Once  . DISCONTD: mineral oil  1 enema Rectal Once  . DISCONTD: sodium chloride  500 mL Intravenous Once   Continuous Infusions:   . sodium chloride    . DISCONTD: sodium chloride 20 mL (04/16/12 1847)   PRN Meds:.acetaminophen, acetaminophen, iohexol, ondansetron (ZOFRAN) IV, ondansetron Assessment/Plan: Patient Active Hospital Problem List: Hyponatremia (08/29/2011)   Assessment:  Pt with recurrent hyponatremia. Her sodium has improved without significant IVF. Urine stuidies pending. Will also check urine legionella antigen. I have asked Nephrology to provide consultation with regard to the hyponatremia.   Abdominal pain (08/29/2011)   Assessment: Pt denies abdominal pain. Will advance to a regular diet.   HYPERTENSION (07/11/2007)   Assessment: BP adequately but not optimally controlled.    Hypothyroidism (08/29/2011)   Assessment: TSH pending. Will continue current dose of Synthroid for now.     Proctitis (04/16/2012)  Assessment: Pt has finding of proctitis on CT and had c/o rectal pain. Pt on cipro and flagyl.    LOS: 1 day

## 2012-04-17 NOTE — Progress Notes (Signed)
Nurse recommended I visit patient. I introduced myself to the patient and asked if she felt like visiting.  Patient stated she was happy to have me visit. Patient also states she is hard of hearing.  We talked for a while...patient talked about her health challenges and family.  Patient also talked about her faith and how much it has helped her over the years.  When I got ready to leave patient requested prayer. I prayed with patient .  Will follow up with patient Friday.

## 2012-04-17 NOTE — H&P (Signed)
Margaret [name] is an 78 y.o. female.  Patient was seen and examined on April 17, 2012 at 3:15 AM. PCP - Dr. Olean Ree. Chief Complaint: Abdominal pain. HPI: 78 year old female with history of chronic constipation and chronic hyponatremia, hypertension and hypothyroidism was brought to the ER because of constipation and patient having nausea vomiting. Patient also complained of some nonspecific abdominal pain. As per the patient's daughter who had discussed with the ER physician had stated that patient has been having constipation and she had received some edema at home. Patient also was given additional edema in the ER. Since patient is coming of abdominal pain labs showed mildly elevated lipase CT abdomen and pelvis was done which at this time only shows proctitis probably from the enema received. In addition labs also show sodium of 119. Patient has been admitted for further management. When I examined the patient at this time patient only complains of rectal pain. Denies any chest pain or shortness of breath.  Past Medical History  Diagnosis Date  . Unspecified sinusitis (chronic)   . Unspecified essential hypertension   . Cerebrovascular disease, unspecified   . Unspecified venous (peripheral) insufficiency   . Pure hypercholesterolemia   . Unspecified disorder of thyroid   . Esophageal reflux   . Unspecified constipation   . Urinary tract infection, site not specified   . Osteoarthrosis, unspecified whether generalized or localized, unspecified site   . Degeneration of cervical intervertebral disc   . Closed fracture of other bone of wrist   . Anxiety state, unspecified   . Idiopathic urticaria   . Anemia, unspecified   . Other B-complex deficiencies   . Herpes zoster without mention of complication     Past Surgical History  Procedure Date  . Tonsillectomy   . Total abdominal hysterectomy w/ bilateral salpingoophorectomy   . Cervical fusion   . Cataract extraction   .  Nasal sinus surgery   . Esophagogastroduodenoscopy 09/10/2011    Procedure: ESOPHAGOGASTRODUODENOSCOPY (EGD);  Surgeon: Petra Kuba, MD;  Location: [facility] ENDOSCOPY;  Service: Endoscopy;  Laterality: N/A;  c-arm needed  . Savory dilation 09/10/2011    Procedure: SAVORY DILATION;  Surgeon: Petra Kuba, MD;  Location: [facility] ENDOSCOPY;  Service: Endoscopy;  Laterality: N/A;    Family History  Problem Relation Age of Onset  . Diabetes Father   . Heart failure Mother   . Diabetes Sister    Social History:  reports that she has never smoked. She has never used smokeless tobacco. She reports that she does not drink alcohol or use illicit drugs.  Allergies:  Allergies  Allergen Reactions  . Aspirin Hives    High Doses     (Not in a hospital admission)  Results for orders placed during the hospital encounter of 04/16/12 (from the past 48 hour(s))  COMPREHENSIVE METABOLIC PANEL     Status: Abnormal   Collection Time   04/16/12  6:32 PM      Component Value Range Comment   Sodium 119 (*) 135 - 145 mEq/L    Potassium 4.3  3.5 - 5.1 mEq/L    Chloride 83 (*) 96 - 112 mEq/L    CO2 26  19 - 32 mEq/L    Glucose, Bld 96  70 - 99 mg/dL    BUN 11  6 - 23 mg/dL    Creatinine, Ser 9.56  0.50 - 1.10 mg/dL    Calcium 21.3  8.4 - 10.5 mg/dL    Total Protein 7.7  6.0 - 8.3 g/dL    Albumin 4.2  3.5 - 5.2 g/dL    AST 32  0 - 37 U/L    ALT 16  0 - 35 U/L    Alkaline Phosphatase 79  39 - 117 U/L    Total Bilirubin 0.5  0.3 - 1.2 mg/dL    GFR calc non Af Amer 82 (*) >90 mL/min    GFR calc Af Amer >90  >90 mL/min   LIPASE, BLOOD     Status: Abnormal   Collection Time   04/16/12  6:32 PM      Component Value Range Comment   Lipase 91 (*) 11 - 59 U/L   CBC WITH DIFFERENTIAL     Status: Abnormal   Collection Time   04/16/12  6:32 PM      Component Value Range Comment   WBC 4.9  4.0 - 10.5 K/uL    RBC 3.23 (*) 3.87 - 5.11 MIL/uL    Hemoglobin 9.9 (*) 12.0 - 15.0 g/dL    HCT 16.1 (*) 09.6 - 46.0 %    MCV  84.2  78.0 - 100.0 fL    MCH 30.7  26.0 - 34.0 pg    MCHC 36.4 (*) 30.0 - 36.0 g/dL    RDW 04.5  40.9 - 81.1 %    Platelets 189  150 - 400 K/uL    Neutrophils Relative 49  43 - 77 %    Neutro Abs 2.4  1.7 - 7.7 K/uL    Lymphocytes Relative 41  12 - 46 %    Lymphs Abs 2.0  0.7 - 4.0 K/uL    Monocytes Relative 9  3 - 12 %    Monocytes Absolute 0.5  0.1 - 1.0 K/uL    Eosinophils Relative 0  0 - 5 %    Eosinophils Absolute 0.0  0.0 - 0.7 K/uL    Basophils Relative 0  0 - 1 %    Basophils Absolute 0.0  0.0 - 0.1 K/uL   OCCULT BLOOD, POC DEVICE     Status: Normal   Collection Time   04/16/12  8:11 PM      Component Value Range Comment   Fecal Occult Bld NEGATIVE     BASIC METABOLIC PANEL     Status: Abnormal   Collection Time   04/17/12 12:37 AM      Component Value Range Comment   Sodium 122 (*) 135 - 145 mEq/L    Potassium 3.8  3.5 - 5.1 mEq/L    Chloride 85 (*) 96 - 112 mEq/L    CO2 27  19 - 32 mEq/L    Glucose, Bld 97  70 - 99 mg/dL    BUN 8  6 - 23 mg/dL    Creatinine, Ser 9.14  0.50 - 1.10 mg/dL    Calcium 9.7  8.4 - 78.2 mg/dL    GFR calc non Af Amer 84 (*) >90 mL/min    GFR calc Af Amer >90  >90 mL/min    Dg Abd 1 View  04/16/2012  *RADIOLOGY REPORT*  Clinical Data: Constipation  ABDOMEN - 1 VIEW  Comparison: 10/11/2011  Findings: Supine abdomen shows no gaseous bowel dilatation to suggest obstruction.  Air is seen scattered along the length of a nondilated colon.  There is some stool in the right colon and in the rectal vault.  Convex leftward lumbar scoliosis is evident.  IMPRESSION: No evidence for bowel obstruction.  Prominent stool volume noted  in the rectum.   Original Report Authenticated By: ERIC A. MANSELL, M.D.    Ct Abdomen Pelvis W Contrast  04/17/2012  *RADIOLOGY REPORT*  Clinical Data: Constipation for 4 days with abdominal pain.  CT ABDOMEN AND PELVIS WITH CONTRAST  Technique:  Multidetector CT imaging of the abdomen and pelvis was performed following the standard  protocol during bolus administration of intravenous contrast.  Contrast: 80mL OMNIPAQUE IOHEXOL 300 MG/ML  SOLN  Comparison: Radiographs 04/16/2012.  CT 06/09/2010.  Findings: There is mild atelectasis or scarring in the right middle lobe and lingula.  The liver, gallbladder and biliary system appear normal.  The pancreas is atrophied without focal abnormality.  The spleen and adrenal glands appear normal.  Small low-density renal lesions bilaterally are unchanged.  Most of these are cysts.  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  Bilateral extrarenal pelves are noted.  There is extensive aorto iliac and proximal visceral arterial atherosclerosis.  No enlarged abdominal pelvic lymph nodes are identified.  Urinary bladder is moderately distended.  There is no pelvic mass status post hysterectomy.  Perirectal soft tissue stranding is similar to the prior examination.  There is no focal fluid collection or other pelvic inflammatory process.  There is multilevel spondylosis inferiorly in the lumbar spine with a degenerative anterolisthesis at L4-L5.  Diffuse bulging of the disc contributes to severe spinal and biforaminal stenosis at that level.  There is asymmetric left hip arthropathy.  No acute osseous findings are seen.  IMPRESSION:  1.  Persistent or recurrent perirectal soft tissue stranding, possibly representing proctitis.  Correlate clinically. 2.  No evidence of bowel obstruction or other inflammatory process. 3.  Extensive atherosclerosis. 4.  Lower lumbar spondylosis with severe multifactorial spinal stenosis at L4-L5.   Original Report Authenticated By: Gerrianne Scale, M.D.     Review of Systems  Constitutional: Negative.   HENT: Negative.   Eyes: Negative.   Respiratory: Negative.   Cardiovascular: Negative.   Gastrointestinal: Positive for nausea, vomiting, abdominal pain and constipation.  Genitourinary: Negative.   Musculoskeletal: Negative.     Skin: Negative.   Neurological: Negative.   Endo/Heme/Allergies: Negative.   Psychiatric/Behavioral: Negative.     Blood pressure 176/82, pulse 77, temperature 97.7 F (36.5 C), temperature source Oral, resp. rate 20, SpO2 96.00%. Physical Exam  Constitutional: She is oriented to person, place, and time. She appears well-developed and well-nourished. No distress.  HENT:  Head: Normocephalic and atraumatic.  Right Ear: External ear normal.  Left Ear: External ear normal.  Nose: Nose normal.  Mouth/Throat: Oropharynx is clear and moist. No oropharyngeal exudate.  Eyes: Conjunctivae are normal. Pupils are equal, round, and reactive to light. Right eye exhibits no discharge. Left eye exhibits no discharge. No scleral icterus.  Neck: Normal range of motion. Neck supple.  Cardiovascular: Normal rate and regular rhythm.   Respiratory: Effort normal and breath sounds normal. No respiratory distress. She has no wheezes. She has no rales.  GI: Soft. Bowel sounds are normal. She exhibits no distension. There is no tenderness. There is no rebound.  Musculoskeletal: Normal range of motion. She exhibits no edema and no tenderness.  Neurological: She is alert and oriented to person, place, and time.       Moves all extremities.  Skin: She is not diaphoretic.     Assessment/Plan #1. Abdominal pain with mildly elevated lipase - patient's abdominal pain may be related to her constipation. Patient at this time  also has mild proctitis. For now we'll keep patient on clear liquid diet. Cipro and Flagyl for proctitis. Slowly advance diet. Patient since has chronic constipation may benefit from Amitiza eventually. Recheck lipase and LFTs. #2. Hyponatremia - patient has chronic hyponatremia. During her last admission in January patient's sodium was around 119 which improved with hydration to 129. At this time we will gently hydrate. Closely follow metabolic panel. Check urine sodium and osmolality and  TSH. #3. Hypertension and hypothyroidism - continue home medications and check TSH.  CODE STATUS - full code.  Keiera Strathman N. 04/17/2012, 3:46 AM

## 2012-04-18 DIAGNOSIS — N390 Urinary tract infection, site not specified: Secondary | ICD-10-CM

## 2012-04-18 DIAGNOSIS — K6289 Other specified diseases of anus and rectum: Secondary | ICD-10-CM

## 2012-04-18 LAB — BASIC METABOLIC PANEL
Calcium: 9.5 mg/dL (ref 8.4–10.5)
GFR calc Af Amer: >90 mL/min (ref >90)
GFR calc non Af Amer: 83 mL/min — ABNORMAL LOW (ref >90)
Sodium: 124 mEq/L — ABNORMAL LOW (ref 135–145)

## 2012-04-18 LAB — LEGIONELLA ANTIGEN, URINE

## 2012-04-18 LAB — CORTISOL: Cortisol, Plasma: 12.6 ug/dL

## 2012-04-18 MED ORDER — AZITHROMYCIN 1 % OP SOLN
1.0000 [drp] | Freq: Every day | OPHTHALMIC | Status: DC
  Administered 2012-04-18 – 2012-04-28 (×11): 1 [drp] via OPHTHALMIC

## 2012-04-18 MED ORDER — POLYVINYL ALCOHOL 1.4 % OP SOLN: 1.0000 [drp] | OPHTHALMIC | Status: DC | PRN

## 2012-04-18 MED ORDER — POLYVINYL ALCOHOL 1.4 % OP SOLN
1.0000 [drp] | Freq: Four times a day (QID) | OPHTHALMIC | Status: DC
  Administered 2012-04-18 – 2012-04-28 (×35): 1 [drp] via OPHTHALMIC
  Filled 2012-04-18 (×2): qty 15

## 2012-04-18 MED ORDER — FUROSEMIDE 20 MG PO TABS
20.0000 mg | ORAL_TABLET | Freq: Two times a day (BID) | ORAL | Status: DC
  Administered 2012-04-18 – 2012-04-19 (×3): 20 mg via ORAL
  Filled 2012-04-18 (×5): qty 1

## 2012-04-18 MED ORDER — POLYETHYLENE GLYCOL 3350 17 G PO PACK
17.0000 g | PACK | Freq: Every day | ORAL | Status: DC
  Administered 2012-04-18 – 2012-04-28 (×7): 17 g via ORAL
  Filled 2012-04-18 (×11): qty 1

## 2012-04-18 MED ORDER — SENNOSIDES-DOCUSATE SODIUM 8.6-50 MG PO TABS
1.0000 | ORAL_TABLET | Freq: Two times a day (BID) | ORAL | Status: DC
  Administered 2012-04-18 – 2012-04-28 (×19): 1 via ORAL
  Filled 2012-04-18 (×21): qty 1

## 2012-04-18 NOTE — Progress Notes (Signed)
Chaplain visited with patient as follow up from yesterday's visit. Patient presented with a prayer shawl. Patient delighted with the prayer shawl.  Both patient and sister expressed appreciation for the volunteers who make the prayer shawls. Will follow up as needed.

## 2012-04-18 NOTE — Progress Notes (Signed)
[facility] Progress Note    Subjective:   No acute issues overnight.  Only complains of feeling generally weak.  Abdominal pain improved.  Notes good appetite during hospitalization.  Worked with PT yesterday.   Objective:   BP 145/64  Pulse 60  Temp 96.5 F (35.8 C) (Axillary)  Resp 18  Ht 5\' 6"  (1.676 m)  Wt 111 lb 8.8 oz (50.6 kg)  BMI 18.01 kg/m2  SpO2 96%  Intake/Output Summary (Last 24 hours) at 04/18/12 0741 Last data filed at 04/18/12 0435  Gross per 24 hour  Intake    860 ml  Output    725 ml  Net    135 ml   Weight change: 1 lb 12.2 oz (0.8 kg)  Physical Exam: Gen:no acute distress, appropriate and cooperative CVS:RRR, no m/r/g, normal S1S2 Resp:CTA b/l without wheezing/rales/rhonchi ZOX:WRUE, NT/ND, normoactive BS Ext:no pedal or pretibial edema Neuro: AAO x3  Imaging: Dg Chest 2 View  04/17/2012  *RADIOLOGY REPORT*  Clinical Data: Hypernatremia.  Evaluate for possible lung mass. Chills and shortness of breath.  CHEST - 2 VIEW  Comparison: 03/27/2010  Findings: The cardiac silhouette is normal in size and configuration.  No mediastinal or hilar masses or adenopathy. Lungs are mildly hyperexpanded. There is minor stable lung scarring in the apices.  The lungs are otherwise clear.  The bony thorax is demineralized but intact.  Calcification is noted along the course of a normal caliber mildly tortuous thoracic aorta.  There are changes from an anterior lower cervical spine fusion.  IMPRESSION: No acute cardiopulmonary disease.  No change from the prior study.   Original Report Authenticated By: Domenic Moras, M.D.    Dg Abd 1 View  04/16/2012  *RADIOLOGY REPORT*  Clinical Data: Constipation  ABDOMEN - 1 VIEW  Comparison: 10/11/2011  Findings: Supine abdomen shows no gaseous bowel dilatation to suggest obstruction.  Air is seen scattered along the length of a nondilated colon.  There is some stool in the right colon and in the rectal vault.  Convex  leftward lumbar scoliosis is evident.  IMPRESSION: No evidence for bowel obstruction.  Prominent stool volume noted in the rectum.   Original Report Authenticated By: ERIC A. MANSELL, M.D.    Ct Abdomen Pelvis W Contrast  04/17/2012  *RADIOLOGY REPORT*  Clinical Data: Constipation for 4 days with abdominal pain.  CT ABDOMEN AND PELVIS WITH CONTRAST  Technique:  Multidetector CT imaging of the abdomen and pelvis was performed following the standard protocol during bolus administration of intravenous contrast.  Contrast: 80mL OMNIPAQUE IOHEXOL 300 MG/ML  SOLN  Comparison: Radiographs 04/16/2012.  CT 06/09/2010.  Findings: There is mild atelectasis or scarring in the right middle lobe and lingula.  The liver, gallbladder and biliary system appear normal.  The pancreas is atrophied without focal abnormality.  The spleen and adrenal glands appear normal.  Small low-density renal lesions bilaterally are unchanged.  Most of these are cysts.  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  Bilateral extrarenal pelves are noted.  There is extensive aorto iliac and proximal visceral arterial atherosclerosis.  No enlarged abdominal pelvic lymph nodes are identified.  Urinary bladder is moderately distended.  There is no pelvic mass status post hysterectomy.  Perirectal soft tissue stranding is similar to the prior examination.  There is no focal fluid collection or other pelvic inflammatory process.  There is multilevel spondylosis inferiorly in the lumbar spine with a degenerative anterolisthesis  at L4-L5.  Diffuse bulging of the disc contributes to severe spinal and biforaminal stenosis at that level.  There is asymmetric left hip arthropathy.  No acute osseous findings are seen.  IMPRESSION:  1.  Persistent or recurrent perirectal soft tissue stranding, possibly representing proctitis.  Correlate clinically. 2.  No evidence of bowel obstruction or other inflammatory process. 3.  Extensive  atherosclerosis. 4.  Lower lumbar spondylosis with severe multifactorial spinal stenosis at L4-L5.   Original Report Authenticated By: Gerrianne Scale, M.D.     Labs: BMET  Lab 04/18/12 1610 04/17/12 1223 04/17/12 0800 04/17/12 0037 04/16/12 1832  NA 124* 121* 125* 122* 119*  K 3.6 3.8 3.8 3.8 4.3  CL 87* 86* 88* 85* 83*  CO2 31 27 29 27 26   GLUCOSE 148* 86 93 97 96  BUN 6 6 7 8 11   CREATININE 0.54 0.48* 0.56 0.53 0.57  ALB -- -- -- -- --  CALCIUM 9.5 9.2 9.6 9.7 10.1  PHOS -- -- -- -- --   CBC  Lab 04/17/12 0800 04/16/12 1832  WBC 3.7* 4.9  NEUTROABS 1.9 2.4  HGB 10.0* 9.9*  HCT 28.1* 27.2*  MCV 84.1 84.2  PLT 172 189    Medications:      . ciprofloxacin  400 mg Intravenous Q12H  . feeding supplement  237 mL Oral BID BM  . levothyroxine  75 mcg Oral Q0600  . metronidazole  500 mg Intravenous Q8H  . multivitamin with minerals  1 tablet Oral Daily  . pantoprazole  40 mg Oral Q1200  . pindolol  5 mg Oral BID  . cyanocobalamin  1,000 mcg Oral Daily  . DISCONTD: guaiFENesin  1,200 mg Oral BID  . DISCONTD: losartan  50 mg Oral Daily     Assessment/ Plan:    #Hyponatremia: Sodium stable. IVF is NSL.  Continue to suspect SIADH given urine osm >100 and urine Na > 40.  TSH wnl.  Cortisol still pending. Primary is also checking urine legionella, which is still pending.  CXR does not reveal mediastinal or hilar masses or adenopathy, but reveal mildly hyperexpanded lungs and stable lung scarring in the apices.  -Add lasix 20mg  BID for BP & fluid mgmt  -Add 2g sodium to diet -monitor BMET with AML -f/u random cortisol  -continue to hold any medications that may contribute to hyponatremia (will continue to hold losartan 50mg  dialy) - suggest avoiding medications such as NSAIDs, thiazides, and SSRIs that may exacerbate hyponatremia.   #Hypothyroidism: TSH wnl, continue home dose levothyroxine per primary   #Normocytic Anemia: Hb at baseline at admission. Continue to monitor  if symptomatic  -Consider repeat b12, folate and ferritin/iron studies if symptomatic  #HTN: Blood pressure had been moderately elevated this morning, but seems to be trending down, - per primary, continue pindolol ; will add lasix as above  #Proctitis: Treated with Cipro/Flagyl per primary   DVT PPX - SCDs   Stacy Gardner, MD 04/18/2012, 7:41 AM  I have seen and examined this patient and agree with the plan of care.   Jaquavius Hudler W 04/18/2012, 12:55 PM

## 2012-04-18 NOTE — Progress Notes (Signed)
Physical Therapy Treatment Patient Details Name: Margaret [name] MRN: [id number] DOB: Dec 28, 1923 Today's Date: 04/18/2012 Time: 4098-1191 PT Time Calculation (min): 33 min  PT Assessment / Plan / Recommendation Comments on Treatment Session  Pt. progressing with gait, though still with generalized weakness and early fatigue limits her.      Follow Up Recommendations  Home health PT;Supervision/Assistance - 24 hour    Barriers to Discharge        Equipment Recommendations  Rolling walker with 5" wheels    Recommendations for Other Services OT consult  Frequency Min 3X/week   Plan Discharge plan remains appropriate    Precautions / Restrictions Precautions Precautions: Fall Restrictions Weight Bearing Restrictions: No   Pertinent Vitals/Pain No pain reported, no distress and sats stable with activity    Mobility  Bed Mobility Bed Mobility: Not assessed (up in chair) Transfers Transfers: Sit to Stand;Stand to Sit Sit to Stand: From chair/3-in-1;4: Min assist;With upper extremity assist Stand to Sit: 4: Min assist;With upper extremity assist;To chair/3-in-1 Details for Transfer Assistance: Pt. seated in low chair upon PT arrival.  She needed min (on the verge of mod) to rise from low surface of chair.  Needed light min assist to rise from 3n1 using arm rests.  Ambulated with min steadying assist with RW.  Required several standing rest breaks due to low activity tolerance. Ambulation/Gait Ambulation/Gait Assistance: 4: Min assist Ambulation Distance (Feet): 150 Feet Assistive device: Rolling walker Ambulation/Gait Assistance Details: needed min steadying assist and Rw for safest ambulation Gait Pattern: Step-through pattern;Trunk flexed Gait velocity: slowed    Exercises General Exercises - Lower Extremity Ankle Circles/Pumps: 20 reps;Both;Seated Long Arc Quad: 20 reps;Seated;Both Hip Flexion/Marching: 15 reps;Seated;Both   PT Diagnosis:    PT Problem List:   PT  Treatment Interventions:     PT Goals Acute Rehab PT Goals PT Goal: Sit to Stand - Progress: Progressing toward goal PT Goal: Stand to Sit - Progress: Progressing toward goal Pt will Ambulate: with modified independence;51 - 150 feet;with least restrictive assistive device PT Goal: Ambulate - Progress: Progressing toward goal  Visit Information  Last PT Received On: 04/18/12 Assistance Needed: +1    Subjective Data  Subjective: "I just feel weak"   Cognition  Overall Cognitive Status: Appears within functional limits for tasks assessed/performed Arousal/Alertness: Awake/alert Orientation Level: Appears intact for tasks assessed Behavior During Session: [facility] for tasks performed    Balance     End of Session PT - End of Session Equipment Utilized During Treatment: Gait belt Activity Tolerance: Patient tolerated treatment well;Patient limited by fatigue Patient left: in chair;with call bell/phone within reach Nurse Communication: Mobility status   GP     Ferman Hamming 04/18/2012, 1:59 PM Weldon Picking PT Acute Rehab Services [phone] [place] [phone]

## 2012-04-18 NOTE — Progress Notes (Signed)
Subjective: Pt alert and oriented x 3. States that she's unable to have a bowel movement. Patient also had very little urine output today per nursing report. Otherwise the patient has no complaints.   Objective: Filed Vitals:   04/18/12 0506 04/18/12 0800 04/18/12 1200 04/18/12 1454  BP: 174/71 145/64 109/54 155/68  Pulse: 70 60 62 66  Temp: 97.7 F (36.5 C) 96.5 F (35.8 C) 96.9 F (36.1 C) 97.6 F (36.4 C)  TempSrc: Oral Axillary Oral Oral  Resp: 20 18 20 18   Height:      Weight:      SpO2: 98% 96% 98% 97%   Weight change: 0.8 kg (1 lb 12.2 oz)  Intake/Output Summary (Last 24 hours) at 04/18/12 1731 Last data filed at 04/18/12 1540  Gross per 24 hour  Intake   1220 ml  Output    600 ml  Net    620 ml    General: Alert, awake, oriented x3, in no acute distress.  HEENT: [place]/AT PEERL, EOMI Neck: Trachea midline,  no masses, no thyromegal,y no JVD, no carotid bruit OROPHARYNX: Mucosa moist, No exudate/ erythema/lesions.  Heart: Regular rate and rhythm, without murmurs, rubs, gallops, PMI non-displaced, no heaves or thrills on palpation.  Lungs: Clear to auscultation, no wheezing or rhonchi noted. No increased vocal fremitus resonant to percussion  Abdomen: Soft, nontender, nondistended, positive bowel sounds, no masses no hepatosplenomegaly noted..  Neuro: No focal neurological deficits noted. Strength functional in bilateral upper and lower extremities. Musculoskeletal: No warm swelling or erythema around joints, no spinal tenderness noted. Psychiatric: Patient alert and oriented x3, good insight and cognition, good recent to remote recall.   Lab Results:  [facility] 04/18/12 0624 04/17/12 1223  NA 124* 121*  K 3.6 3.8  CL 87* 86*  CO2 31 27  GLUCOSE 148* 86  BUN 6 6  CREATININE 0.54 0.48*  CALCIUM 9.5 9.2  MG -- --  PHOS -- --    Basename 04/17/12 0800 04/16/12 1832  AST 29 32  ALT 14 16  ALKPHOS 76 79  BILITOT 0.4 0.5  PROT 7.0 7.7  ALBUMIN 3.9 4.2     Basename 04/17/12 0800 04/16/12 1832  LIPASE 43 91*  AMYLASE -- --    Basename 04/17/12 0800 04/16/12 1832  WBC 3.7* 4.9  NEUTROABS 1.9 2.4  HGB 10.0* 9.9*  HCT 28.1* 27.2*  MCV 84.1 84.2  PLT 172 189    Basename 04/17/12 0800  CKTOTAL --  CKMB --  CKMBINDEX --  TROPONINI <0.30   No components found with this basename: POCBNP:3 No results found for this basename: DDIMER:2 in the last 72 hours No results found for this basename: HGBA1C:2 in the last 72 hours No results found for this basename: CHOL:2,HDL:2,LDLCALC:2,TRIG:2,CHOLHDL:2,LDLDIRECT:2 in the last 72 hours  Basename 04/17/12 0800  TSH 0.510  T4TOTAL --  T3FREE --  THYROIDAB --   No results found for this basename: VITAMINB12:2,FOLATE:2,FERRITIN:2,TIBC:2,IRON:2,RETICCTPCT:2 in the last 72 hours  Micro Results: Recent Results (from the past 240 hour(s))  URINE CULTURE     Status: Normal (Preliminary result)   Collection Time   04/17/12 10:07 AM      Component Value Range Status Comment   Specimen Description URINE, CLEAN CATCH   Final    Special Requests NONE   Final    Culture  Setup Time 04/17/2012 10:53   Final    Colony Count >=100,000 COLONIES/ML   Final    Culture ESCHERICHIA COLI   Final  Report Status PENDING   Incomplete     Studies/Results: Dg Abd 1 View  04/16/2012  *RADIOLOGY REPORT*  Clinical Data: Constipation  ABDOMEN - 1 VIEW  Comparison: 10/11/2011  Findings: Supine abdomen shows no gaseous bowel dilatation to suggest obstruction.  Air is seen scattered along the length of a nondilated colon.  There is some stool in the right colon and in the rectal vault.  Convex leftward lumbar scoliosis is evident.  IMPRESSION: No evidence for bowel obstruction.  Prominent stool volume noted in the rectum.   Original Report Authenticated By: ERIC A. MANSELL, M.D.    Ct Abdomen Pelvis W Contrast  04/17/2012  *RADIOLOGY REPORT*  Clinical Data: Constipation for 4 days with abdominal pain.  CT ABDOMEN AND  PELVIS WITH CONTRAST  Technique:  Multidetector CT imaging of the abdomen and pelvis was performed following the standard protocol during bolus administration of intravenous contrast.  Contrast: 80mL OMNIPAQUE IOHEXOL 300 MG/ML  SOLN  Comparison: Radiographs 04/16/2012.  CT 06/09/2010.  Findings: There is mild atelectasis or scarring in the right middle lobe and lingula.  The liver, gallbladder and biliary system appear normal.  The pancreas is atrophied without focal abnormality.  The spleen and adrenal glands appear normal.  Small low-density renal lesions bilaterally are unchanged.  Most of these are cysts.  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  Bilateral extrarenal pelves are noted.  There is extensive aorto iliac and proximal visceral arterial atherosclerosis.  No enlarged abdominal pelvic lymph nodes are identified.  Urinary bladder is moderately distended.  There is no pelvic mass status post hysterectomy.  Perirectal soft tissue stranding is similar to the prior examination.  There is no focal fluid collection or other pelvic inflammatory process.  There is multilevel spondylosis inferiorly in the lumbar spine with a degenerative anterolisthesis at L4-L5.  Diffuse bulging of the disc contributes to severe spinal and biforaminal stenosis at that level.  There is asymmetric left hip arthropathy.  No acute osseous findings are seen.  IMPRESSION:  1.  Persistent or recurrent perirectal soft tissue stranding, possibly representing proctitis.  Correlate clinically. 2.  No evidence of bowel obstruction or other inflammatory process. 3.  Extensive atherosclerosis. 4.  Lower lumbar spondylosis with severe multifactorial spinal stenosis at L4-L5.   Original Report Authenticated By: Gerrianne Scale, M.D.     Medications: I have reviewed the patient's current medications. Scheduled Meds:    . ciprofloxacin  400 mg Intravenous Q12H  . feeding supplement  237 mL Oral  BID BM  . furosemide  20 mg Oral BID  . levothyroxine  75 mcg Oral Q0600  . metronidazole  500 mg Intravenous Q8H  . multivitamin with minerals  1 tablet Oral Daily  . pantoprazole  40 mg Oral Q1200  . pindolol  5 mg Oral BID  . polyethylene glycol  17 g Oral Daily  . senna-docusate  1 tablet Oral BID  . cyanocobalamin  1,000 mcg Oral Daily  . DISCONTD: guaiFENesin  1,200 mg Oral BID   Continuous Infusions:  PRN Meds:.acetaminophen, acetaminophen, guaiFENesin, ondansetron (ZOFRAN) IV, ondansetron Assessment/Plan: Patient Active Hospital Problem List: UTI (03/18/2012)   Assessment: Urine culture shows Escherichia coli however sensitivities not yet reported. Currently the patient is on ciprofloxacin will continue on that and narrow after sensitivities resulted.  Hyponatremia (08/29/2011)   Assessment:  Pt with recurrent hyponatremia. Her sodium has improved without significant IVF. Urine stuidies pending. Will also check urine  legionella antigen. I have asked Nephrology to provide consultation with regard to the hyponatremia.   Abdominal pain (08/29/2011)   Assessment: Pt denies abdominal pain. Will advance to a regular diet.   HYPERTENSION (07/11/2007)   Assessment: BP adequately but not optimally controlled.    Hypothyroidism (08/29/2011)   Assessment: TSH pending. Will continue current dose of Synthroid for now.     Proctitis (04/16/2012)  Assessment: Pt has finding of proctitis on CT and had c/o rectal pain. Pt on cipro and flagyl.     LOS: 2 days

## 2012-04-18 NOTE — Progress Notes (Signed)
Azasite 1% eye drop (med from home) placed in pt med drawer for use during hospital stay. Med to go back home with pt on DC. [place], Rosanna Randy

## 2012-04-19 LAB — BASIC METABOLIC PANEL
Calcium: 8.9 mg/dL (ref 8.4–10.5)
GFR calc Af Amer: >90 mL/min (ref >90)
GFR calc non Af Amer: 85 mL/min — ABNORMAL LOW (ref >90)
Potassium: 3.2 mEq/L — ABNORMAL LOW (ref 3.5–5.1)
Sodium: 124 mEq/L — ABNORMAL LOW (ref 135–145)

## 2012-04-19 LAB — URINE CULTURE: Colony Count: >=100000

## 2012-04-19 MED ORDER — METRONIDAZOLE 500 MG PO TABS
500.0000 mg | ORAL_TABLET | Freq: Three times a day (TID) | ORAL | Status: DC
  Administered 2012-04-19 – 2012-04-22 (×8): 500 mg via ORAL
  Filled 2012-04-19 (×12): qty 1

## 2012-04-19 MED ORDER — CIPROFLOXACIN HCL 500 MG PO TABS
500.0000 mg | ORAL_TABLET | Freq: Two times a day (BID) | ORAL | Status: DC
  Administered 2012-04-19 – 2012-04-22 (×6): 500 mg via ORAL
  Filled 2012-04-19 (×8): qty 1

## 2012-04-19 MED ORDER — POTASSIUM CHLORIDE CRYS ER 20 MEQ PO TBCR
40.0000 meq | EXTENDED_RELEASE_TABLET | Freq: Two times a day (BID) | ORAL | Status: AC
Start: 2012-04-19 — End: 2012-04-19
  Administered 2012-04-19 (×2): 40 meq via ORAL
  Filled 2012-04-19: qty 1
  Filled 2012-04-19: qty 2

## 2012-04-19 MED ORDER — FUROSEMIDE 10 MG/ML IJ SOLN
20.0000 mg | Freq: Two times a day (BID) | INTRAMUSCULAR | Status: DC
Start: 2012-04-19 — End: 2012-04-21
  Administered 2012-04-19 – 2012-04-21 (×5): 20 mg via INTRAVENOUS
  Filled 2012-04-19 (×8): qty 2

## 2012-04-19 NOTE — Progress Notes (Signed)
I have seen and examined this patient and agree with the plan of care , discussed with Dr Everardo Beals.  Cleon Thoma W 04/19/2012, 10:29 AM

## 2012-04-19 NOTE — Progress Notes (Signed)
[facility] Progress Note    Subjective:   Felt anxious overnight, but no acute issues.  Doing better this morning.  Would like to have catheter removed. Abdominal pain intermittent, but improved.   Objective:   BP 162/73  Pulse 80  Temp 97.6 F (36.4 C) (Oral)  Resp 16  Ht 5\' 6"  (1.676 m)  Wt 111 lb 8.8 oz (50.6 kg)  BMI 18.01 kg/m2  SpO2 92%  Intake/Output Summary (Last 24 hours) at 04/19/12 0945 Last data filed at 04/19/12 0500  Gross per 24 hour  Intake    240 ml  Output   1100 ml  Net   -860 ml   Weight change:   Physical Exam: Gen:no acute distress, appropriate and cooperative  CVS:RRR, no m/r/g, normal S1S2  Resp:CTA b/l without wheezing/rales/rhonchi  ZOX:WRUE, NT/ND, normoactive BS  Ext:no pedal or pretibial edema  Neuro: AAO x3  Imaging: Dg Chest 2 View  04/17/2012  *RADIOLOGY REPORT*  Clinical Data: Hypernatremia.  Evaluate for possible lung mass. Chills and shortness of breath.  CHEST - 2 VIEW  Comparison: 03/27/2010  Findings: The cardiac silhouette is normal in size and configuration.  No mediastinal or hilar masses or adenopathy. Lungs are mildly hyperexpanded. There is minor stable lung scarring in the apices.  The lungs are otherwise clear.  The bony thorax is demineralized but intact.  Calcification is noted along the course of a normal caliber mildly tortuous thoracic aorta.  There are changes from an anterior lower cervical spine fusion.  IMPRESSION: No acute cardiopulmonary disease.  No change from the prior study.   Original Report Authenticated By: Domenic Moras, M.D.     Labs: BMET  Lab 04/19/12 0730 04/18/12 0624 04/17/12 1223 04/17/12 0800 04/17/12 0037 04/16/12 1832  NA 124* 124* 121* 125* 122* 119*  K 3.2* 3.6 3.8 3.8 3.8 4.3  CL 87* 87* 86* 88* 85* 83*  CO2 28 31 27 29 27 26   GLUCOSE 102* 148* 86 93 97 96  BUN 6 6 6 7 8 11   CREATININE 0.51 0.54 0.48* 0.56 0.53 0.57  ALB -- -- -- -- -- --  CALCIUM 8.9 9.5 9.2 9.6 9.7  10.1  PHOS -- -- -- -- -- --   CBC  Lab 04/17/12 0800 04/16/12 1832  WBC 3.7* 4.9  NEUTROABS 1.9 2.4  HGB 10.0* 9.9*  HCT 28.1* 27.2*  MCV 84.1 84.2  PLT 172 189    Medications:      . azithromycin  1 drop Both Eyes Daily  . ciprofloxacin  400 mg Intravenous Q12H  . feeding supplement  237 mL Oral BID BM  . furosemide  20 mg Intravenous BID  . levothyroxine  75 mcg Oral Q0600  . metronidazole  500 mg Intravenous Q8H  . multivitamin with minerals  1 tablet Oral Daily  . pantoprazole  40 mg Oral Q1200  . pindolol  5 mg Oral BID  . polyethylene glycol  17 g Oral Daily  . polyvinyl alcohol  1 drop Both Eyes QID  . senna-docusate  1 tablet Oral BID  . cyanocobalamin  1,000 mcg Oral Daily  . DISCONTD: furosemide  20 mg Oral BID     Assessment/ Plan:    #Hyponatremia: Sodium stable-no change. IVF is NSL. Continue to suspect SIADH given urine osm >100 and urine Na > 40. TSH wnl. Cortisol wnl. Urine legionella negative.  -Change lasix to 20mg  IV (from PO) BID for BP & fluid mgmt  -Recheck urine  osm, if <280 then start NS @ 75cc/h -Continue 2g sodium to diet  -monitor BMET with AML  -continue to hold any medications that may contribute to hyponatremia (will continue to hold losartan 50mg  dialy) - suggest avoiding medications such as NSAIDs, thiazides, and SSRIs that may exacerbate hyponatremia.   #Hypothyroidism: TSH wnl, continue home dose levothyroxine per primary   #Normocytic Anemia: Hb at baseline at admission. Continue to monitor if symptomatic  -Consider repeat b12, folate and ferritin/iron studies if symptomatic   #HTN: Blood pressure was moderately elevated this morning, but seems to be trending down, - per primary, continue pindolol ; will increase lasix as above   #Proctitis: Treated with Cipro/Flagyl per primary  DVT PPX - SCDs   Stacy Gardner, MD 04/19/2012, 9:45 AM

## 2012-04-19 NOTE — Progress Notes (Signed)
Physical Therapy Treatment Patient Details Name: Margaret [name] MRN: [id number] DOB: Nov 28, 1923 Today's Date: 04/19/2012 Time: 1000-1018 PT Time Calculation (min): 18 min  PT Assessment / Plan / Recommendation Comments on Treatment Session  Pt with UTI, proctitis and hyponatremia who is progressing with mobility but continues to require cueing for sequence and safety and educated for need for assist with mobility. Pt continue to state her sister is coming to stay with her at discharge. Will continue to follow.     Follow Up Recommendations       Barriers to Discharge        Equipment Recommendations       Recommendations for Other Services    Frequency     Plan Discharge plan remains appropriate;Frequency remains appropriate    Precautions / Restrictions Precautions Precautions: Fall   Pertinent Vitals/Pain No pain    Mobility  Bed Mobility Supine to Sit: 6: Modified independent (Device/Increase time);With rails Transfers Sit to Stand: 5: Supervision;From bed Stand to Sit: 5: Supervision;To chair/3-in-1 Details for Transfer Assistance: cueing for hand placement and safety Ambulation/Gait Ambulation/Gait Assistance: 5: Supervision Ambulation Distance (Feet): 350 Feet Assistive device: Rolling walker Ambulation/Gait Assistance Details: cueing for position in RW and directional cues to return to room Gait Pattern: Step-through pattern;Trunk flexed;Decreased stride length Gait velocity: decreased Stairs: Yes Stairs Assistance: 5: Supervision Stairs Assistance Details (indicate cue type and reason): cueing for safety Stair Management Technique: Two rails;Step to pattern Number of Stairs: 3     Exercises General Exercises - Lower Extremity Long Arc Quad: AROM;Both;15 reps;Seated Hip Flexion/Marching: AROM;Both;Other reps (comment);Seated (25reps)   PT Diagnosis:    PT Problem List:   PT Treatment Interventions:     PT Goals Acute Rehab PT Goals PT Goal: Supine/Side  to Sit - Progress: Met PT Goal: Sit to Stand - Progress: Progressing toward goal PT Goal: Stand to Sit - Progress: Progressing toward goal PT Goal: Ambulate - Progress: Progressing toward goal PT Goal: Up/Down Stairs - Progress: Met  Visit Information  Last PT Received On: 04/19/12 Assistance Needed: +1    Subjective Data  Subjective: I had a dream I got up and walked in the hall   Cognition  Overall Cognitive Status: Appears within functional limits for tasks assessed/performed Arousal/Alertness: Awake/alert Orientation Level: Disoriented to;Time Behavior During Session: [facility] for tasks performed    Balance     End of Session PT - End of Session Equipment Utilized During Treatment: Gait belt Activity Tolerance: Patient tolerated treatment well Patient left: in chair;with call bell/phone within reach Nurse Communication: Mobility status   GP     Delorse Lek 04/19/2012, 10:32 AM Delaney Meigs, PT [phone]

## 2012-04-19 NOTE — Progress Notes (Signed)
Subjective: Pt alert and oriented x 3. States that she's still been  unable to have a bowel movement but hasn't had much of an appetite.  Objective: Filed Vitals:   04/18/12 2130 04/19/12 0522 04/19/12 1125 04/19/12 1408  BP: 144/58 162/73 120/59 117/58  Pulse: 66 80 65 72  Temp: 97.6 F (36.4 C) 97.6 F (36.4 C) 98 F (36.7 C) 97.5 F (36.4 C)  TempSrc: Oral Oral Oral Oral  Resp: 16 16 18 18   Height:      Weight:      SpO2: 94% 92% 95% 98%   Weight change:   Intake/Output Summary (Last 24 hours) at 04/19/12 1711 Last data filed at 04/19/12 1417  Gross per 24 hour  Intake    480 ml  Output   1500 ml  Net  -1020 ml    General: Alert, awake, oriented x3, in no acute distress.  HEENT: [place]/AT PEERL, EOMI Neck: Trachea midline,  no masses, no thyromegal,y no JVD, no carotid bruit OROPHARYNX: Mucosa moist, No exudate/ erythema/lesions.  Heart: Regular rate and rhythm, without murmurs, rubs, gallops, PMI non-displaced, no heaves or thrills on palpation.  Lungs: Clear to auscultation, no wheezing or rhonchi noted. No increased vocal fremitus resonant to percussion  Abdomen: Soft, nontender, nondistended, positive bowel sounds, no masses no hepatosplenomegaly noted.  Neuro: No focal neurological deficits noted. Strength functional in bilateral upper and lower extremities. Psychiatric: Patient alert and oriented x3, good insight and cognition, good recent to remote recall.   Lab Results:  [facility] 04/19/12 0730 04/18/12 0624  NA 124* 124*  K 3.2* 3.6  CL 87* 87*  CO2 28 31  GLUCOSE 102* 148*  BUN 6 6  CREATININE 0.51 0.54  CALCIUM 8.9 9.5  MG -- --  PHOS -- --    Basename 04/17/12 0800 04/16/12 1832  AST 29 32  ALT 14 16  ALKPHOS 76 79  BILITOT 0.4 0.5  PROT 7.0 7.7  ALBUMIN 3.9 4.2    Basename 04/17/12 0800 04/16/12 1832  LIPASE 43 91*  AMYLASE -- --    Basename 04/17/12 0800 04/16/12 1832  WBC 3.7* 4.9  NEUTROABS 1.9 2.4  HGB 10.0* 9.9*  HCT 28.1* 27.2*   MCV 84.1 84.2  PLT 172 189    Basename 04/17/12 0800  CKTOTAL --  CKMB --  CKMBINDEX --  TROPONINI <0.30   No components found with this basename: POCBNP:3 No results found for this basename: DDIMER:2 in the last 72 hours No results found for this basename: HGBA1C:2 in the last 72 hours No results found for this basename: CHOL:2,HDL:2,LDLCALC:2,TRIG:2,CHOLHDL:2,LDLDIRECT:2 in the last 72 hours  Basename 04/17/12 0800  TSH 0.510  T4TOTAL --  T3FREE --  THYROIDAB --   No results found for this basename: VITAMINB12:2,FOLATE:2,FERRITIN:2,TIBC:2,IRON:2,RETICCTPCT:2 in the last 72 hours  Micro Results: Recent Results (from the past 240 hour(s))  URINE CULTURE     Status: Normal   Collection Time   04/17/12 10:07 AM      Component Value Range Status Comment   Specimen Description URINE, CLEAN CATCH   Final    Special Requests NONE   Final    Culture  Setup Time 04/17/2012 10:53   Final    Colony Count >=100,000 COLONIES/ML   Final    Culture ESCHERICHIA COLI   Final    Report Status 04/19/2012 FINAL   Final    Organism ID, Bacteria ESCHERICHIA COLI   Final     Studies/Results: Dg Abd 1 View  04/16/2012  *RADIOLOGY REPORT*  Clinical Data: Constipation  ABDOMEN - 1 VIEW  Comparison: 10/11/2011  Findings: Supine abdomen shows no gaseous bowel dilatation to suggest obstruction.  Air is seen scattered along the length of a nondilated colon.  There is some stool in the right colon and in the rectal vault.  Convex leftward lumbar scoliosis is evident.  IMPRESSION: No evidence for bowel obstruction.  Prominent stool volume noted in the rectum.   Original Report Authenticated By: ERIC A. MANSELL, M.D.    Ct Abdomen Pelvis W Contrast  04/17/2012  *RADIOLOGY REPORT*  Clinical Data: Constipation for 4 days with abdominal pain.  CT ABDOMEN AND PELVIS WITH CONTRAST  Technique:  Multidetector CT imaging of the abdomen and pelvis was performed following the standard protocol during bolus  administration of intravenous contrast.  Contrast: 80mL OMNIPAQUE IOHEXOL 300 MG/ML  SOLN  Comparison: Radiographs 04/16/2012.  CT 06/09/2010.  Findings: There is mild atelectasis or scarring in the right middle lobe and lingula.  The liver, gallbladder and biliary system appear normal.  The pancreas is atrophied without focal abnormality.  The spleen and adrenal glands appear normal.  Small low-density renal lesions bilaterally are unchanged.  Most of these are cysts.  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  Bilateral extrarenal pelves are noted.  There is extensive aorto iliac and proximal visceral arterial atherosclerosis.  No enlarged abdominal pelvic lymph nodes are identified.  Urinary bladder is moderately distended.  There is no pelvic mass status post hysterectomy.  Perirectal soft tissue stranding is similar to the prior examination.  There is no focal fluid collection or other pelvic inflammatory process.  There is multilevel spondylosis inferiorly in the lumbar spine with a degenerative anterolisthesis at L4-L5.  Diffuse bulging of the disc contributes to severe spinal and biforaminal stenosis at that level.  There is asymmetric left hip arthropathy.  No acute osseous findings are seen.  IMPRESSION:  1.  Persistent or recurrent perirectal soft tissue stranding, possibly representing proctitis.  Correlate clinically. 2.  No evidence of bowel obstruction or other inflammatory process. 3.  Extensive atherosclerosis. 4.  Lower lumbar spondylosis with severe multifactorial spinal stenosis at L4-L5.   Original Report Authenticated By: Gerrianne Scale, M.D.     Medications: I have reviewed the patient's current medications. Scheduled Meds:    . azithromycin  1 drop Both Eyes Daily  . ciprofloxacin  500 mg Oral BID  . feeding supplement  237 mL Oral BID BM  . furosemide  20 mg Intravenous BID  . levothyroxine  75 mcg Oral Q0600  . metroNIDAZOLE  500 mg Oral  Q8H  . multivitamin with minerals  1 tablet Oral Daily  . pantoprazole  40 mg Oral Q1200  . pindolol  5 mg Oral BID  . polyethylene glycol  17 g Oral Daily  . polyvinyl alcohol  1 drop Both Eyes QID  . potassium chloride SA  40 mEq Oral BID  . senna-docusate  1 tablet Oral BID  . cyanocobalamin  1,000 mcg Oral Daily  . DISCONTD: ciprofloxacin  400 mg Intravenous Q12H  . DISCONTD: furosemide  20 mg Oral BID  . DISCONTD: metronidazole  500 mg Intravenous Q8H   Continuous Infusions:  PRN Meds:.acetaminophen, acetaminophen, guaiFENesin, ondansetron (ZOFRAN) IV, ondansetron, polyvinyl alcohol Assessment/Plan: Patient Active Hospital Problem List: UTI (03/18/2012)   Assessment: Urine culture shows Escherichia coli sensitive to ciprofloxacin on day #4.Will change to oral route.  Hyponatremia (  08/29/2011)   Assessment:  Pt with recurrent hyponatremia. I will defer to nephrology.  Hypokalemia (04/19/2012)   Assessment: replete orally  Abdominal pain (08/29/2011)   Assessment: Pt denies abdominal pain. Tolerating diet.   HYPERTENSION (07/11/2007)   Assessment: BP adequately but not optimally controlled.    Hypothyroidism (08/29/2011)   Assessment: TSH pending. Will continue current dose of Synthroid for now.     Proctitis (04/16/2012)  Assessment: Pt has finding of proctitis on CT and had c/o rectal pain. Pt on cipro and flagyl.     LOS: 3 days

## 2012-04-20 LAB — BASIC METABOLIC PANEL
Chloride: 88 mEq/L — ABNORMAL LOW (ref 96–112)
Creatinine, Ser: 0.65 mg/dL (ref 0.50–1.10)
GFR calc Af Amer: >90 mL/min (ref >90)
GFR calc non Af Amer: 78 mL/min — ABNORMAL LOW (ref >90)
Potassium: 3.9 mEq/L (ref 3.5–5.1)

## 2012-04-20 LAB — RENAL FUNCTION PANEL
Albumin: 3.7 g/dL (ref 3.5–5.2)
BUN: 6 mg/dL (ref 6–23)
Chloride: 85 mEq/L — ABNORMAL LOW (ref 96–112)
Creatinine, Ser: 0.61 mg/dL (ref 0.50–1.10)
Glucose, Bld: 110 mg/dL — ABNORMAL HIGH (ref 70–99)

## 2012-04-20 LAB — CREATININE, URINE, 24 HOUR
Creatinine, 24H Ur: 413 mg/d — ABNORMAL LOW (ref 700–1800)
Creatinine, Urine: 18.57 mg/dL
Urine Total Volume-UCRE24: 2225 mL

## 2012-04-20 MED ORDER — FUROSEMIDE 10 MG/ML IJ SOLN
40.0000 mg | Freq: Once | INTRAMUSCULAR | Status: AC
  Administered 2012-04-20: 40 mg via INTRAVENOUS
  Filled 2012-04-20: qty 4

## 2012-04-20 MED ORDER — SODIUM CHLORIDE 0.9 % IV SOLN
INTRAVENOUS | Status: DC
  Administered 2012-04-20: 08:00:00 via INTRAVENOUS

## 2012-04-20 NOTE — Progress Notes (Signed)
[facility] Progress Note    Subjective:   No acute issues.  Doing better this morning and eating breakfast.  Would like to have catheter removed. Abdominal pain intermittent, but improved.   Objective:   BP 138/56  Pulse 71  Temp 98.3 F (36.8 C) (Oral)  Resp 18  Ht 5\' 6"  (1.676 m)  Wt 116 lb 6.5 oz (52.8 kg)  BMI 18.79 kg/m2  SpO2 96%  Intake/Output Summary (Last 24 hours) at 04/20/12 0853 Last data filed at 04/20/12 0303  Gross per 24 hour  Intake    720 ml  Output   3050 ml  Net  -2330 ml   Weight change:   Physical Exam: Gen:no acute distress, appropriate and cooperative  CVS:RRR, no m/r/g, normal S1S2  Resp:CTA b/l without wheezing/rales/rhonchi  RUE:AVWU, NT/ND, normoactive BS  Ext:no pedal or pretibial edema  Neuro: AAO x3  Imaging: No results found.  Labs: BMET  Lab 04/20/12 0600 04/19/12 0730 04/18/12 0624 04/17/12 1223 04/17/12 0800 04/17/12 0037 04/16/12 1832  NA 124* 124* 124* 121* 125* 122* 119*  K 3.9 3.2* 3.6 3.8 3.8 3.8 4.3  CL 88* 87* 87* 86* 88* 85* 83*  CO2 30 28 31 27 29 27 26   GLUCOSE 103* 102* 148* 86 93 97 96  BUN 6 6 6 6 7 8 11   CREATININE 0.65 0.51 0.54 0.48* 0.56 0.53 0.57  ALB -- -- -- -- -- -- --  CALCIUM 9.4 8.9 9.5 9.2 9.6 9.7 10.1  PHOS -- -- -- -- -- -- --   CBC  Lab 04/17/12 0800 04/16/12 1832  WBC 3.7* 4.9  NEUTROABS 1.9 2.4  HGB 10.0* 9.9*  HCT 28.1* 27.2*  MCV 84.1 84.2  PLT 172 189    Medications:       . azithromycin  1 drop Both Eyes Daily  . ciprofloxacin  500 mg Oral BID  . feeding supplement  237 mL Oral BID BM  . furosemide  20 mg Intravenous BID  . levothyroxine  75 mcg Oral Q0600  . metroNIDAZOLE  500 mg Oral Q8H  . multivitamin with minerals  1 tablet Oral Daily  . pantoprazole  40 mg Oral Q1200  . pindolol  5 mg Oral BID  . polyethylene glycol  17 g Oral Daily  . polyvinyl alcohol  1 drop Both Eyes QID  . potassium chloride SA  40 mEq Oral BID  . senna-docusate  1 tablet  Oral BID  . cyanocobalamin  1,000 mcg Oral Daily  . DISCONTD: ciprofloxacin  400 mg Intravenous Q12H  . DISCONTD: furosemide  20 mg Oral BID  . DISCONTD: metronidazole  500 mg Intravenous Q8H     Assessment/ Plan:    #Hyponatremia: Sodium stable-no change. IVF is NSL. Continue to suspect SIADH given urine osm >100 and urine Na > 40. TSH wnl. Cortisol wnl. Urine legionella negative.  -Lasix 20mg  IV (from PO) BID for BP & fluid mgmt  -Start NS @ 50cc/h -Continue 2g sodium to diet  -monitor BMET at 1200 and adjust fluids per sodium levels -continue to hold any medications that may contribute to hyponatremia (will continue to hold losartan 50mg  dialy) - suggest avoiding medications such as NSAIDs, thiazides, and SSRIs that may exacerbate hyponatremia.   #Hypothyroidism: TSH wnl, continue home dose levothyroxine per primary   #Normocytic Anemia: Hb at baseline at admission. Continue to monitor if symptomatic  -Consider repeat b12, folate and ferritin/iron studies if symptomatic   #HTN: Blood pressure was  moderately elevated this morning, but seems to be trending down, - per primary, continue pindolol, lasix as above   #Proctitis: Treated with Cipro/Flagyl per primary  DVT PPX - SCDs   Genella Mech, MD 04/20/2012, 8:53 AM

## 2012-04-20 NOTE — Progress Notes (Signed)
Subjective: Margaret [name] alert and oriented x 3.   Interval History: Margaret [name]'s serum sodium still remains low. Objective: Filed Vitals:   04/19/12 2032 04/20/12 0548 04/20/12 1000 04/20/12 1333  BP: 151/72 138/56 119/60 98/61  Pulse: 68 71 72 68  Temp: 98.6 F (37 C) 98.3 F (36.8 C) 98 F (36.7 C) 97.6 F (36.4 C)  TempSrc: Oral Oral Oral Oral  Resp: 18 18 18 18   Height:      Weight: 52.8 kg (116 lb 6.5 oz)     SpO2: 97% 96% 98% 98%   Weight change:   Intake/Output Summary (Last 24 hours) at 04/20/12 1614 Last data filed at 04/20/12 1300  Gross per 24 hour  Intake    770 ml  Output   3600 ml  Net  -2830 ml    General: Alert, awake, oriented x3, in no acute distress.  HEENT: [place]/AT PEERL, EOMI  Heart: Regular rate and rhythm, without murmurs, rubs, gallops, PMI non-displaced, no heaves or thrills on palpation.  Lungs: Clear to auscultation, no wheezing or rhonchi noted. No increased vocal fremitus resonant to percussion  Abdomen: Soft, nontender, nondistended, positive bowel sounds, no masses no hepatosplenomegaly noted.  Neuro: No focal neurological deficits noted. Strength functional in bilateral upper and lower extremities.    Lab Results:  Basename 04/20/12 1120 04/20/12 0600  NA 121* 124*  K 3.8 3.9  CL 85* 88*  CO2 27 30  GLUCOSE 110* 103*  BUN 6 6  CREATININE 0.61 0.65  CALCIUM 9.4 9.4  MG -- --  PHOS 2.9 --    Basename 04/20/12 1120  AST --  ALT --  ALKPHOS --  BILITOT --  PROT --  ALBUMIN 3.7   No results found for this basename: LIPASE:2,AMYLASE:2 in the last 72 hours No results found for this basename: WBC:2,NEUTROABS:2,HGB:2,HCT:2,MCV:2,PLT:2 in the last 72 hours No results found for this basename: CKTOTAL:3,CKMB:3,CKMBINDEX:3,TROPONINI:3 in the last 72 hours No components found with this basename: POCBNP:3 No results found for this basename: DDIMER:2 in the last 72 hours No results found for this basename: HGBA1C:2 in the last 72 hours No results found  for this basename: CHOL:2,HDL:2,LDLCALC:2,TRIG:2,CHOLHDL:2,LDLDIRECT:2 in the last 72 hours No results found for this basename: TSH,T4TOTAL,FREET3,T3FREE,THYROIDAB in the last 72 hours No results found for this basename: VITAMINB12:2,FOLATE:2,FERRITIN:2,TIBC:2,IRON:2,RETICCTPCT:2 in the last 72 hours  Micro Results: Recent Results (from the past 240 hour(s))  URINE CULTURE     Status: Normal   Collection Time   04/17/12 10:07 AM      Component Value Range Status Comment   Specimen Description URINE, CLEAN CATCH   Final    Special Requests NONE   Final    Culture  Setup Time 04/17/2012 10:53   Final    Colony Count >=100,000 COLONIES/ML   Final    Culture ESCHERICHIA COLI   Final    Report Status 04/19/2012 FINAL   Final    Organism ID, Bacteria ESCHERICHIA COLI   Final     Studies/Results: Dg Abd 1 View  04/16/2012  *RADIOLOGY REPORT*  Clinical Data: Constipation  ABDOMEN - 1 VIEW  Comparison: 10/11/2011  Findings: Supine abdomen shows no gaseous bowel dilatation to suggest obstruction.  Air is seen scattered along the length of a nondilated colon.  There is some stool in the right colon and in the rectal vault.  Convex leftward lumbar scoliosis is evident.  IMPRESSION: No evidence for bowel obstruction.  Prominent stool volume noted in the rectum.   Original Report Authenticated By:  ERIC A. MANSELL, M.D.    Ct Abdomen Pelvis W Contrast  04/17/2012  *RADIOLOGY REPORT*  Clinical Data: Constipation for 4 days with abdominal pain.  CT ABDOMEN AND PELVIS WITH CONTRAST  Technique:  Multidetector CT imaging of the abdomen and pelvis was performed following the standard protocol during bolus administration of intravenous contrast.  Contrast: 80mL OMNIPAQUE IOHEXOL 300 MG/ML  SOLN  Comparison: Radiographs 04/16/2012.  CT 06/09/2010.  Findings: There is mild atelectasis or scarring in the right middle lobe and lingula.  The liver, gallbladder and biliary system appear normal.  The pancreas is atrophied  without focal abnormality.  The spleen and adrenal glands appear normal.  Small low-density renal lesions bilaterally are unchanged.  Most of these are cysts.  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  Bilateral extrarenal pelves are noted.  There is extensive aorto iliac and proximal visceral arterial atherosclerosis.  No enlarged abdominal pelvic lymph nodes are identified.  Urinary bladder is moderately distended.  There is no pelvic mass status post hysterectomy.  Perirectal soft tissue stranding is similar to the prior examination.  There is no focal fluid collection or other pelvic inflammatory process.  There is multilevel spondylosis inferiorly in the lumbar spine with a degenerative anterolisthesis at L4-L5.  Diffuse bulging of the disc contributes to severe spinal and biforaminal stenosis at that level.  There is asymmetric left hip arthropathy.  No acute osseous findings are seen.  IMPRESSION:  1.  Persistent or recurrent perirectal soft tissue stranding, possibly representing proctitis.  Correlate clinically. 2.  No evidence of bowel obstruction or other inflammatory process. 3.  Extensive atherosclerosis. 4.  Lower lumbar spondylosis with severe multifactorial spinal stenosis at L4-L5.   Original Report Authenticated By: Gerrianne Scale, M.D.     Medications: I have reviewed the patient's current medications. Scheduled Meds:    . azithromycin  1 drop Both Eyes Daily  . ciprofloxacin  500 mg Oral BID  . feeding supplement  237 mL Oral BID BM  . furosemide  20 mg Intravenous BID  . furosemide  40 mg Intravenous Once  . levothyroxine  75 mcg Oral Q0600  . metroNIDAZOLE  500 mg Oral Q8H  . multivitamin with minerals  1 tablet Oral Daily  . pantoprazole  40 mg Oral Q1200  . pindolol  5 mg Oral BID  . polyethylene glycol  17 g Oral Daily  . polyvinyl alcohol  1 drop Both Eyes QID  . potassium chloride SA  40 mEq Oral BID  . senna-docusate  1 tablet  Oral BID  . cyanocobalamin  1,000 mcg Oral Daily   Continuous Infusions:    . DISCONTD: sodium chloride 50 mL/hr at 04/20/12 0823   PRN Meds:.acetaminophen, acetaminophen, guaiFENesin, ondansetron (ZOFRAN) IV, ondansetron, polyvinyl alcohol Assessment/Plan: Patient Active Hospital Problem List: UTI (03/18/2012)   Assessment: Urine culture shows Escherichia coli sensitive to ciprofloxacin on day #5.Will change to oral route.  Hyponatremia (08/29/2011)   Assessment: Despite restricting fluids and lasix sodium is still low. Will discuss with Dr. Hyman Hopes about starting Demeclocycline.  Hypokalemia (04/19/2012)   Assessment: repleted  Abdominal pain (08/29/2011)   Assessment: Margaret [name] denies abdominal pain. Tolerating diet.   HYPERTENSION (07/11/2007)   Assessment: BP adequately but not optimally controlled.    Hypothyroidism (08/29/2011)   Assessment: TSH pending. Will continue current dose of Synthroid for now.     Proctitis (04/16/2012)  Assessment: Margaret [name] has finding of proctitis on CT and  had c/o rectal pain. Margaret [name] on cipro and flagyl day #     LOS: 4 days

## 2012-04-20 NOTE — Progress Notes (Signed)
I have seen and examined this patient and agree with the plan of care. Will check renal panel today  Sophronia Varney W 04/20/2012, 10:18 AM

## 2012-04-21 DIAGNOSIS — R636 Underweight: Secondary | ICD-10-CM | POA: Insufficient documentation

## 2012-04-21 LAB — RENAL FUNCTION PANEL
BUN: 7 mg/dL (ref 6–23)
CO2: 31 mEq/L (ref 19–32)
GFR calc Af Amer: 88 mL/min — ABNORMAL LOW (ref >90)
Glucose, Bld: 111 mg/dL — ABNORMAL HIGH (ref 70–99)
Phosphorus: 3.1 mg/dL (ref 2.3–4.6)
Potassium: 3.4 mEq/L — ABNORMAL LOW (ref 3.5–5.1)
Sodium: 122 mEq/L — ABNORMAL LOW (ref 135–145)

## 2012-04-21 MED ORDER — DEMECLOCYCLINE HCL 150 MG PO TABS
300.0000 mg | ORAL_TABLET | Freq: Two times a day (BID) | ORAL | Status: DC
  Administered 2012-04-21 (×2): 300 mg via ORAL
  Filled 2012-04-21 (×4): qty 2

## 2012-04-21 NOTE — Progress Notes (Signed)
I have seen and examined this patient and agree with plan as outlined by Dr. Everardo Beals.  Will try demeclocycline, but she may require tolvaptan.  Need to r/o malignancy given persistent hyponatremia.  Also concern that she is not eating enough protein to maintain gradient to maximally dilute her urine.  Will continue to follow. Ascher Schroepfer A,MD 04/21/2012 4:41 PM

## 2012-04-21 NOTE — Clinical Documentation Improvement (Signed)
BMI DOCUMENTATION CLARIFICATION QUERY  THIS DOCUMENT IS NOT A PERMANENT PART OF THE MEDICAL RECORD  TO RESPOND TO THE THIS QUERY, FOLLOW THE INSTRUCTIONS BELOW:  1. If needed, update documentation for the patient's encounter via the notes activity.  2. Access this query again and click edit on the In [employer].  3. After updating, or not, click F2 to complete all highlighted (required) fields concerning your review. Select "additional documentation in the medical record" OR "no additional documentation provided".  4. Click Sign note button.  5. The deficiency will fall out of your In Basket *Please let us know if you are not able to complete this workflow by phone or e-mail (listed below).          04/21/12  Dear Dr. Ashley Royalty Marton Redwood  In an effort to better capture your patient's severity of illness, reflect appropriate length of stay and utilization of resources, a review of the patient medical record has revealed the following indicators.    Based on your clinical judgment, please clarify and document in a progress note and/or discharge summary the clinical condition associated with the following supporting information:  In responding to this query please exercise your independent judgment.  The fact that a query is asked, does not imply that any particular answer is desired or expected.  Possible Clinical conditions:  Underweight w/BMI=17.72  Other condition  Cannot Clinically determine _____________   Sign & Symptoms (As per Nutrition Assessment by RD) "NUTRITION DIAGNOSIS: -Inadequate oral intake (NI-2.1).  Status: Ongoing" "Body mass index is 17.72 kg/(m^2). Underweight."   BMI=  17.72 kg/(m^2). Underweight.   Treatment(As per Nutrition Assessment by RD) INTERVENTION:  1. Ensure Complete po BID, each supplement provides 350 kcal and 13 grams of protein.  2. Downgrade diet to Dysphagia 3 given pt report of difficulty chewing with poor dentition  3. MVI 1 tab  PO daily  4. RD to continue to follow nutrition care plan    Reviewed: additional documentation in the medical record  Thank You,  Joanette Gula Delk RN, BSN Clinical Documentation Specialist: [phone] Pager Health Information Management [place]

## 2012-04-21 NOTE — Progress Notes (Signed)
Subjective: Pt alert and oriented x 3. States that she still feels weak.  Interval History: Pt's serum sodium still remains low. Started on Demeclocycline this morning.  Objective: Filed Vitals:   04/21/12 0521 04/21/12 1000 04/21/12 1400 04/21/12 1800  BP: 147/68 139/74 120/66 131/71  Pulse: 77 75 70 71  Temp: 97.8 F (36.6 C) 98.4 F (36.9 C) 98 F (36.7 C) 98 F (36.7 C)  TempSrc: Oral Oral Oral Oral  Resp: 20 20 20 20   Height:      Weight:      SpO2: 95% 97% 97% 96%   Weight change: 0.6 kg (1 lb 5.2 oz)  Intake/Output Summary (Last 24 hours) at 04/21/12 1935 Last data filed at 04/21/12 1300  Gross per 24 hour  Intake    480 ml  Output      0 ml  Net    480 ml    General: Alert, awake, oriented x3, in no acute distress.  HEENT: [place]/AT PEERL, EOMI  Heart: Regular rate and rhythm, without murmurs, rubs, gallops, PMI non-displaced, no heaves or thrills on palpation.  Lungs: Clear to auscultation, no wheezing or rhonchi noted. No increased vocal fremitus resonant to percussion  Abdomen: Soft, nontender, nondistended, positive bowel sounds, no masses no hepatosplenomegaly noted.  Neuro: No focal neurological deficits noted. Strength functional in bilateral upper and lower extremities. Extremities: No clubbing cyanosis or edema   Lab Results:  Basename 04/21/12 0720 04/20/12 1120  NA 122* 121*  K 3.4* 3.8  CL 83* 85*  CO2 31 27  GLUCOSE 111* 110*  BUN 7 6  CREATININE 0.70 0.61  CALCIUM 9.2 9.4  MG -- --  PHOS 3.1 2.9    Basename 04/21/12 0720 04/20/12 1120  AST -- --  ALT -- --  ALKPHOS -- --  BILITOT -- --  PROT -- --  ALBUMIN 3.5 3.7   No results found for this basename: LIPASE:2,AMYLASE:2 in the last 72 hours No results found for this basename: WBC:2,NEUTROABS:2,HGB:2,HCT:2,MCV:2,PLT:2 in the last 72 hours No results found for this basename: CKTOTAL:3,CKMB:3,CKMBINDEX:3,TROPONINI:3 in the last 72 hours No components found with this basename:  POCBNP:3 No results found for this basename: DDIMER:2 in the last 72 hours No results found for this basename: HGBA1C:2 in the last 72 hours No results found for this basename: CHOL:2,HDL:2,LDLCALC:2,TRIG:2,CHOLHDL:2,LDLDIRECT:2 in the last 72 hours No results found for this basename: TSH,T4TOTAL,FREET3,T3FREE,THYROIDAB in the last 72 hours No results found for this basename: VITAMINB12:2,FOLATE:2,FERRITIN:2,TIBC:2,IRON:2,RETICCTPCT:2 in the last 72 hours  Micro Results: Recent Results (from the past 240 hour(s))  URINE CULTURE     Status: Normal   Collection Time   04/17/12 10:07 AM      Component Value Range Status Comment   Specimen Description URINE, CLEAN CATCH   Final    Special Requests NONE   Final    Culture  Setup Time 04/17/2012 10:53   Final    Colony Count >=100,000 COLONIES/ML   Final    Culture ESCHERICHIA COLI   Final    Report Status 04/19/2012 FINAL   Final    Organism ID, Bacteria ESCHERICHIA COLI   Final     Studies/Results: Dg Abd 1 View  04/16/2012  *RADIOLOGY REPORT*  Clinical Data: Constipation  ABDOMEN - 1 VIEW  Comparison: 10/11/2011  Findings: Supine abdomen shows no gaseous bowel dilatation to suggest obstruction.  Air is seen scattered along the length of a nondilated colon.  There is some stool in the right colon and in the rectal  vault.  Convex leftward lumbar scoliosis is evident.  IMPRESSION: No evidence for bowel obstruction.  Prominent stool volume noted in the rectum.   Original Report Authenticated By: ERIC A. MANSELL, M.D.    Ct Abdomen Pelvis W Contrast  04/17/2012  *RADIOLOGY REPORT*  Clinical Data: Constipation for 4 days with abdominal pain.  CT ABDOMEN AND PELVIS WITH CONTRAST  Technique:  Multidetector CT imaging of the abdomen and pelvis was performed following the standard protocol during bolus administration of intravenous contrast.  Contrast: 80mL OMNIPAQUE IOHEXOL 300 MG/ML  SOLN  Comparison: Radiographs 04/16/2012.  CT 06/09/2010.  Findings:  There is mild atelectasis or scarring in the right middle lobe and lingula.  The liver, gallbladder and biliary system appear normal.  The pancreas is atrophied without focal abnormality.  The spleen and adrenal glands appear normal.  Small low-density renal lesions bilaterally are unchanged.  Most of these are cysts.  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  Bilateral extrarenal pelves are noted.  There is extensive aorto iliac and proximal visceral arterial atherosclerosis.  No enlarged abdominal pelvic lymph nodes are identified.  Urinary bladder is moderately distended.  There is no pelvic mass status post hysterectomy.  Perirectal soft tissue stranding is similar to the prior examination.  There is no focal fluid collection or other pelvic inflammatory process.  There is multilevel spondylosis inferiorly in the lumbar spine with a degenerative anterolisthesis at L4-L5.  Diffuse bulging of the disc contributes to severe spinal and biforaminal stenosis at that level.  There is asymmetric left hip arthropathy.  No acute osseous findings are seen.  IMPRESSION:  1.  Persistent or recurrent perirectal soft tissue stranding, possibly representing proctitis.  Correlate clinically. 2.  No evidence of bowel obstruction or other inflammatory process. 3.  Extensive atherosclerosis. 4.  Lower lumbar spondylosis with severe multifactorial spinal stenosis at L4-L5.   Original Report Authenticated By: Gerrianne Scale, M.D.     Medications: I have reviewed the patient's current medications. Scheduled Meds:    . azithromycin  1 drop Both Eyes Daily  . ciprofloxacin  500 mg Oral BID  . demeclocycline  300 mg Oral Q12H  . feeding supplement  237 mL Oral BID BM  . levothyroxine  75 mcg Oral Q0600  . metroNIDAZOLE  500 mg Oral Q8H  . multivitamin with minerals  1 tablet Oral Daily  . pantoprazole  40 mg Oral Q1200  . pindolol  5 mg Oral BID  . polyethylene glycol  17 g Oral  Daily  . polyvinyl alcohol  1 drop Both Eyes QID  . senna-docusate  1 tablet Oral BID  . cyanocobalamin  1,000 mcg Oral Daily  . DISCONTD: furosemide  20 mg Intravenous BID   Continuous Infusions:   PRN Meds:.acetaminophen, acetaminophen, guaiFENesin, ondansetron (ZOFRAN) IV, ondansetron, polyvinyl alcohol Assessment/Plan: Patient Active Hospital Problem List: UTI (03/18/2012)   Assessment: Urine culture shows Escherichia coli sensitive to ciprofloxacin on day #5.will be treated after 2 additional days of antibiotics  Hyponatremia (08/29/2011)   Assessment: Started on Demeclocycline this morning. We'll recheck sodium tomorrow  Hypokalemia (04/19/2012)   Assessment: repleted  Abdominal pain (08/29/2011)   Assessment: Pt denies abdominal pain. Tolerating diet.   HYPERTENSION (07/11/2007)   Assessment: BP adequately but not optimally controlled.    Hypothyroidism (08/29/2011)   Assessment: TSH pending. Will continue current dose of Synthroid for now.     Proctitis (04/16/2012)  Assessment: Pt has finding of  proctitis on CT and had c/o rectal pain. Pt on cipro and flagyl day #5     LOS: 5 days

## 2012-04-21 NOTE — Progress Notes (Signed)
[facility] Progress Note    Subjective:   No acute issues overnight. Slept well. Eating well. C/o dysuria. Wants to go home.     Objective:   BP 147/68  Pulse 77  Temp 97.8 F (36.6 C) (Oral)  Resp 20  Ht 5\' 6"  (1.676 m)  Wt 117 lb 11.6 oz (53.4 kg)  BMI 19.00 kg/m2  SpO2 95%  Intake/Output Summary (Last 24 hours) at 04/21/12 1031 Last data filed at 04/20/12 1700  Gross per 24 hour  Intake    490 ml  Output    850 ml  Net   -360 ml   Weight change: 1 lb 5.2 oz (0.6 kg)  Physical Exam: Gen:no acute distress, appropriate and cooperative  CVS:RRR, no m/r/g, normal S1S2  Resp:CTA b/l without wheezing/rales/rhonchi  ZOX:WRUE, NT/ND, normoactive BS  Ext:no pedal or pretibial edema  Neuro: AAO x3  Imaging: No results found.  Labs: BMET  Lab 04/21/12 0720 04/20/12 1120 04/20/12 0600 04/19/12 0730 04/18/12 0624 04/17/12 1223 04/17/12 0800  NA 122* 121* 124* 124* 124* 121* 125*  K 3.4* 3.8 3.9 3.2* 3.6 3.8 3.8  CL 83* 85* 88* 87* 87* 86* 88*  CO2 31 27 30 28 31 27 29   GLUCOSE 111* 110* 103* 102* 148* 86 93  BUN 7 6 6 6 6 6 7   CREATININE 0.70 0.61 0.65 0.51 0.54 0.48* 0.56  ALB -- -- -- -- -- -- --  CALCIUM 9.2 9.4 9.4 8.9 9.5 9.2 9.6  PHOS 3.1 2.9 -- -- -- -- --   CBC  Lab 04/17/12 0800 04/16/12 1832  WBC 3.7* 4.9  NEUTROABS 1.9 2.4  HGB 10.0* 9.9*  HCT 28.1* 27.2*  MCV 84.1 84.2  PLT 172 189    Medications:      . azithromycin  1 drop Both Eyes Daily  . ciprofloxacin  500 mg Oral BID  . feeding supplement  237 mL Oral BID BM  . furosemide  20 mg Intravenous BID  . furosemide  40 mg Intravenous Once  . levothyroxine  75 mcg Oral Q0600  . metroNIDAZOLE  500 mg Oral Q8H  . multivitamin with minerals  1 tablet Oral Daily  . pantoprazole  40 mg Oral Q1200  . pindolol  5 mg Oral BID  . polyethylene glycol  17 g Oral Daily  . polyvinyl alcohol  1 drop Both Eyes QID  . senna-docusate  1 tablet Oral BID  . cyanocobalamin  1,000 mcg Oral  Daily     Assessment/ Plan:    #Hyponatremia: Sodium decreased with IVF and then again trending up with cessation of IVF. Currently IVF is NSL. Continue to suspect SIADH given urine osm >100 (even repeat, and with decline of Na with IVF trial) and urine Na > 40. TSH wnl. Cortisol wnl. Urine legionella negative. Last mammo in 2009 negative.  No recent h/o beer intake. Albumin 3.5. -D/c lasix - no improvement -Start demeclocycline 300 BID -Renal fxn panel in AM -May consider further brain or lung imaging and repeat mammo to evaluate for neoplasm -Rec increasing protein in diet -Continue to hold losartan 50 daily for now -Continue 2g sodium to diet  -monitor BMET with AML; will also check urine osm/Na and serum osm in AM -continue to hold any medications that may contribute to hyponatremia- suggest avoiding medications such as NSAIDs, thiazides, and SSRIs that may exacerbate hyponatremia.   #Hypothyroidism: TSH wnl, continue home dose levothyroxine per primary   #Normocytic Anemia: Hb at  baseline at admission. Continue to monitor if symptomatic  -Consider repeat b12, folate and ferritin/iron studies if symptomatic   #HTN: Blood pressure was moderately elevated this morning, but seems to be trending down, - per primary, continue pindolol  #Proctitis: Treated with Cipro/Flagyl per primary   #UTI: Cipro per primary  DVT PPX - SCDs   Margaret Gardner, MD 04/21/2012, 10:31 AM

## 2012-04-22 ENCOUNTER — Inpatient Hospital Stay (HOSPITAL_COMMUNITY): Payer: Medicare Other

## 2012-04-22 DIAGNOSIS — R636 Underweight: Secondary | ICD-10-CM

## 2012-04-22 LAB — OSMOLALITY: Osmolality: 244 mOsm/kg — ABNORMAL LOW (ref 275–300)

## 2012-04-22 LAB — RENAL FUNCTION PANEL
BUN: 7 mg/dL (ref 6–23)
Calcium: 9.5 mg/dL (ref 8.4–10.5)
Glucose, Bld: 107 mg/dL — ABNORMAL HIGH (ref 70–99)
Phosphorus: 2.7 mg/dL (ref 2.3–4.6)
Potassium: 3.4 mEq/L — ABNORMAL LOW (ref 3.5–5.1)

## 2012-04-22 LAB — BASIC METABOLIC PANEL
CO2: 30 mEq/L (ref 19–32)
CO2: 29 mEq/L (ref 19–32)
Calcium: 9.8 mg/dL (ref 8.4–10.5)
Chloride: 78 mEq/L — ABNORMAL LOW (ref 96–112)
Chloride: 83 mEq/L — ABNORMAL LOW (ref 96–112)
Creatinine, Ser: 0.58 mg/dL (ref 0.50–1.10)
Glucose, Bld: 149 mg/dL — ABNORMAL HIGH (ref 70–99)
Potassium: 3.6 mEq/L (ref 3.5–5.1)
Potassium: 4.2 mEq/L (ref 3.5–5.1)
Sodium: 119 mEq/L — CL (ref 135–145)

## 2012-04-22 LAB — OSMOLALITY, URINE: Osmolality, Ur: 203 mOsm/kg — ABNORMAL LOW (ref 390–1090)

## 2012-04-22 MED ORDER — ENSURE PUDDING PO PUDG
1.0000 | Freq: Three times a day (TID) | ORAL | Status: DC
  Administered 2012-04-22 – 2012-04-28 (×16): 1 via ORAL

## 2012-04-22 MED ORDER — POTASSIUM CHLORIDE CRYS ER 20 MEQ PO TBCR
40.0000 meq | EXTENDED_RELEASE_TABLET | Freq: Once | ORAL | Status: AC
  Administered 2012-04-22: 40 meq via ORAL
  Filled 2012-04-22: qty 2

## 2012-04-22 MED ORDER — TOLVAPTAN 15 MG PO TABS
15.0000 mg | ORAL_TABLET | ORAL | Status: AC
  Administered 2012-04-22 – 2012-04-23 (×2): 15 mg via ORAL
  Filled 2012-04-22 (×3): qty 1

## 2012-04-22 NOTE — Progress Notes (Signed)
[facility] Progress Note    Subjective:   No acute events overnight.  Intermittent dysuria.  Feels generalized weakness from sitting. Wants to go home.  Notes that she has not had a bath since admission, but doesn't want to complain.   Objective:   BP 170/72  Pulse 72  Temp 98.4 F (36.9 C) (Oral)  Resp 18  Ht 5\' 6"  (1.676 m)  Wt 117 lb 1 oz (53.1 kg)  BMI 18.89 kg/m2  SpO2 95%  Intake/Output Summary (Last 24 hours) at 04/22/12 0739 Last data filed at 04/21/12 1300  Gross per 24 hour  Intake    480 ml  Output      0 ml  Net    480 ml   Weight change: -10.6 oz (-0.3 kg)  Physical Exam: Gen:no acute distress, appropriate and cooperative  CVS:RRR, no m/r/g, normal S1S2  Resp:CTA b/l without wheezing/rales/rhonchi  ZOX:WRUE, NT/ND, normoactive BS  GU: white powder along vaginal labial folds Ext:no pedal or pretibial edema  Neuro: AAO x3   Imaging: No results found.  Labs: BMET  Lab 04/22/12 0608 04/21/12 0720 04/20/12 1120 04/20/12 0600 04/19/12 0730 04/18/12 0624 04/17/12 1223  NA 119* 122* 121* 124* 124* 124* 121*  K 3.4* 3.4* 3.8 3.9 3.2* 3.6 3.8  CL 81* 83* 85* 88* 87* 87* 86*  CO2 33* 31 27 30 28 31 27   GLUCOSE 107* 111* 110* 103* 102* 148* 86  BUN 7 7 6 6 6 6 6   CREATININE 0.61 0.70 0.61 0.65 0.51 0.54 0.48*  ALB -- -- -- -- -- -- --  CALCIUM 9.5 9.2 9.4 9.4 8.9 9.5 9.2  PHOS 2.7 3.1 2.9 -- -- -- --   CBC  Lab 04/17/12 0800 04/16/12 1832  WBC 3.7* 4.9  NEUTROABS 1.9 2.4  HGB 10.0* 9.9*  HCT 28.1* 27.2*  MCV 84.1 84.2  PLT 172 189    Medications:      . azithromycin  1 drop Both Eyes Daily  . ciprofloxacin  500 mg Oral BID  . demeclocycline  300 mg Oral Q12H  . feeding supplement  237 mL Oral BID BM  . levothyroxine  75 mcg Oral Q0600  . metroNIDAZOLE  500 mg Oral Q8H  . multivitamin with minerals  1 tablet Oral Daily  . pantoprazole  40 mg Oral Q1200  . pindolol  5 mg Oral BID  . polyethylene glycol  17 g Oral Daily  .  polyvinyl alcohol  1 drop Both Eyes QID  . senna-docusate  1 tablet Oral BID  . cyanocobalamin  1,000 mcg Oral Daily  . DISCONTD: furosemide  20 mg Intravenous BID     Assessment/ Plan:    #Hyponatremia: Sodium decreased with d/c of lasix & initiation of demeclocycline. Currently IVF is NSL. Urine studies for this AM pending. TSH wnl. Cortisol wnl. Urine legionella negative. Last mammo in 2009 negative. No recent h/o beer intake. Albumin 3.5.  -No improvement with demeclocycline - discontinue; consider restarting PO lasix for fluid & bp mgmt -Renal fxn panel in AM  -Replete potassium with Kdur 40 x 1 dose -Further brain or lung imaging and repeat mammo to evaluate for neoplasm  -Continue to hold losartan 50 daily for now  -Continue 2g sodium to diet  -monitor sodium with AML -continue to hold any medications that may contribute to hyponatremia- suggest avoiding medications such as NSAIDs, thiazides, and SSRIs that may exacerbate hyponatremia.   #Hypothyroidism: TSH wnl, continue home dose levothyroxine per  primary   #Normocytic Anemia: Hb at baseline at admission. Continue to monitor if symptomatic  -Consider repeat b12, folate and ferritin/iron studies if symptomatic   #HTN: Blood pressure was moderately elevated this morning, but seems to be trending down, - per primary, continue pindolol   #Proctitis: Treated with Cipro/Flagyl per primary   #UTI: Cipro per primary (cx grew e.coli sensitive to cipro, no yeast seen)  DVT PPX - SCDs    Stacy Gardner, MD 04/22/2012, 7:39 AM

## 2012-04-22 NOTE — Progress Notes (Signed)
Dr. Ashley Royalty texted panic sodium of 119. Awaiting response/orders.

## 2012-04-22 NOTE — Progress Notes (Addendum)
I have seen and examined this patient and agree with plan as outlined by Dr. Everardo Beals.  Mrs. Margaret [name]'s Na improved after fluid restriction and lasix initiallly, however declined after several days.  Her Uosm improved but was not maximally dilute suggesting tea and toast syndrome, however significantly worsened with only minimal free water in form of NS supporting SIADH.  Given drop in Na will d/c demeclocyline and start samsca 15mg  daily.  Agree with malignancy workup and cont to increase protein intake.  Still awaiting uosm/sosm that were ordered yesterday.  Cont to follow. Bethann Qualley A,MD 04/22/2012 9:22 AM  Repeat sodium at 2:50 pm was down to 114.  Unfortunately she only received tolvaptan 2 hours prior to this lab draw.  She is asymptomatic and will discontinue cipro as this has also been associated with hyponatremia.  Her CT scan suggested indolent infection such as MAI which could also account for her proctitis and hyponatremia.  Agree with ID eval +/- Pulmonary.  Her Na should improve with samsca however if it falls further, would transfer to SDU for hypertonic saline therapy.

## 2012-04-22 NOTE — Progress Notes (Signed)
[facility] PROGRESS NOTE  Margaret [name] NGE:[id number] DOB: Feb 24, 1924 DOA: 04/16/2012 PCP: Michele Mcalpine, MD  Assessment/Plan: Principal Problem:  * Hyponatremia          Active Problems:  HYPERTENSION  Hypothyroidism   Proctitis   Underweight  1. Hyponatremia: Pt continues to have a decrease in sodium levels. I've discussed it with Nephrology and she will be started on Tolvaptan. I am concerned about the possibility of a lung process or underlying malignancy contributing. I have ordered CT chest to evaluat Will continue to monitor serum sodium. If no significant improvement may require hypertonic saline. 2. Proctitis: Pt has been treated for 6 days with cipro and flagyl. Will discontinue after today as may be contributing to hyponatremia. 3. Underweight: Nutrition consult pending 4. Hypothyroidism: TSH within normal limits.  Code Status: Full code Family Communication: Updated daughter Malachi Bonds on patient's status Disposition Plan: Home at time of discharge  Jaiceon Collister A.  [facility] Pager [phone]. If 8PM-8AM, please contact night-coverage at [email or web address], password [facility] 04/22/2012, 8:34 AM  LOS: 6 days   Brief narrative: Pt is an 78 year old female with history of chronic constipation and chronic hyponatremia, hypertension and hypothyroidism was brought to the ER because of constipation and patient having nausea vomiting. Patient also complained of some nonspecific abdominal pain. As per the patient's daughter who had discussed with the ER physician had stated that patient has been having constipation and she had received some edema at home. Patient also was given additional edema in the ER. Since patient is coming of abdominal pain labs showed mildly elevated lipase CT abdomen and pelvis was done which at this time only shows proctitis probably from the enema received. In addition labs also show sodium of 119. Patient has been admitted for further management. When  I examined the patient at this time patient only complains of rectal pain. Denies any chest pain or shortness of breath.   Consultants:  Nephrology  Procedures:  None  Antibiotics:  Ciprofloxacin 9/5 >>9/10  Flagyl 9/5 >>9/10  HPI/Subjective: Pt states that she feels well.  Objective: Filed Vitals:   04/21/12 1400 04/21/12 1800 04/21/12 2026 04/22/12 0552  BP: 120/66 131/71 152/75 170/72  Pulse: 70 71 76 72  Temp: 98 F (36.7 C) 98 F (36.7 C) 98.4 F (36.9 C) 98.4 F (36.9 C)  TempSrc: Oral Oral Oral Oral  Resp: 20 20 18 18   Height:      Weight:   53.1 kg (117 lb 1 oz)   SpO2: 97% 96% 97% 95%   Weight change: -0.3 kg (-10.6 oz)  Intake/Output Summary (Last 24 hours) at 04/22/12 0834 Last data filed at 04/21/12 1300  Gross per 24 hour  Intake    480 ml  Output      0 ml  Net    480 ml    General: Alert, awake, oriented x3, in no acute distress. Frail appearing.  HEENT: [place]/AT PEERL, EOMI Heart: Regular rate and rhythm, without murmurs, rubs, gallops,.  Lungs: Clear to auscultation.  Abdomen: Soft, nontender, nondistended, positive bowel sounds. Neuro: No focal neurological deficits noted  Musculoskeletal: No warm swelling or erythema around joints, no spinal tenderness noted. Psychiatric: Patient alert and oriented x3, good insight and cognition, good recent to remote recall.    Data Reviewed: Basic Metabolic Panel:  Lab 04/22/12 5366 04/21/12 0720 04/20/12 1120 04/20/12 0600 04/19/12 0730  NA 119* 122* 121* 124* 124*  K 3.4* 3.4* 3.8 3.9 3.2*  CL 81* 83* 85* 88*  87*  CO2 33* 31 27 30 28   GLUCOSE 107* 111* 110* 103* 102*  BUN 7 7 6 6 6   CREATININE 0.61 0.70 0.61 0.65 0.51  CALCIUM 9.5 9.2 9.4 9.4 8.9  MG -- -- -- -- --  PHOS 2.7 3.1 2.9 -- --   Liver Function Tests:  Lab 04/22/12 0608 04/21/12 0720 04/20/12 1120 04/17/12 0800 04/16/12 1832  AST -- -- -- 29 32  ALT -- -- -- 14 16  ALKPHOS -- -- -- 76 79  BILITOT -- -- -- 0.4 0.5  PROT -- --  -- 7.0 7.7  ALBUMIN 3.6 3.5 3.7 3.9 4.2    Lab 04/17/12 0800 04/16/12 1832  LIPASE 43 91*  AMYLASE -- --   No results found for this basename: AMMONIA:5 in the last 168 hours CBC:  Lab 04/17/12 0800 04/16/12 1832  WBC 3.7* 4.9  NEUTROABS 1.9 2.4  HGB 10.0* 9.9*  HCT 28.1* 27.2*  MCV 84.1 84.2  PLT 172 189   Cardiac Enzymes:  Lab 04/17/12 0800  CKTOTAL --  CKMB --  CKMBINDEX --  TROPONINI <0.30   BNP (last 3 results) No results found for this basename: PROBNP:3 in the last 8760 hours CBG: No results found for this basename: GLUCAP:5 in the last 168 hours  Recent Results (from the past 240 hour(s))  URINE CULTURE     Status: Normal   Collection Time   04/17/12 10:07 AM      Component Value Range Status Comment   Specimen Description URINE, CLEAN CATCH   Final    Special Requests NONE   Final    Culture  Setup Time 04/17/2012 10:53   Final    Colony Count >=100,000 COLONIES/ML   Final    Culture ESCHERICHIA COLI   Final    Report Status 04/19/2012 FINAL   Final    Organism ID, Bacteria ESCHERICHIA COLI   Final      Studies: Dg Chest 2 View  04/17/2012  *RADIOLOGY REPORT*  Clinical Data: Hypernatremia.  Evaluate for possible lung mass. Chills and shortness of breath.  CHEST - 2 VIEW  Comparison: 03/27/2010  Findings: The cardiac silhouette is normal in size and configuration.  No mediastinal or hilar masses or adenopathy. Lungs are mildly hyperexpanded. There is minor stable lung scarring in the apices.  The lungs are otherwise clear.  The bony thorax is demineralized but intact.  Calcification is noted along the course of a normal caliber mildly tortuous thoracic aorta.  There are changes from an anterior lower cervical spine fusion.  IMPRESSION: No acute cardiopulmonary disease.  No change from the prior study.   Original Report Authenticated By: Domenic Moras, M.D.    Dg Abd 1 View  04/16/2012  *RADIOLOGY REPORT*  Clinical Data: Constipation  ABDOMEN - 1 VIEW   Comparison: 10/11/2011  Findings: Supine abdomen shows no gaseous bowel dilatation to suggest obstruction.  Air is seen scattered along the length of a nondilated colon.  There is some stool in the right colon and in the rectal vault.  Convex leftward lumbar scoliosis is evident.  IMPRESSION: No evidence for bowel obstruction.  Prominent stool volume noted in the rectum.   Original Report Authenticated By: ERIC A. MANSELL, M.D.    Ct Abdomen Pelvis W Contrast  04/17/2012  *RADIOLOGY REPORT*  Clinical Data: Constipation for 4 days with abdominal pain.  CT ABDOMEN AND PELVIS WITH CONTRAST  Technique:  Multidetector CT imaging of the abdomen and pelvis was performed  following the standard protocol during bolus administration of intravenous contrast.  Contrast: 80mL OMNIPAQUE IOHEXOL 300 MG/ML  SOLN  Comparison: Radiographs 04/16/2012.  CT 06/09/2010.  Findings: There is mild atelectasis or scarring in the right middle lobe and lingula.  The liver, gallbladder and biliary system appear normal.  The pancreas is atrophied without focal abnormality.  The spleen and adrenal glands appear normal.  Small low-density renal lesions bilaterally are unchanged.  Most of these are cysts.  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  Bilateral extrarenal pelves are noted.  There is extensive aorto iliac and proximal visceral arterial atherosclerosis.  No enlarged abdominal pelvic lymph nodes are identified.  Urinary bladder is moderately distended.  There is no pelvic mass status post hysterectomy.  Perirectal soft tissue stranding is similar to the prior examination.  There is no focal fluid collection or other pelvic inflammatory process.  There is multilevel spondylosis inferiorly in the lumbar spine with a degenerative anterolisthesis at L4-L5.  Diffuse bulging of the disc contributes to severe spinal and biforaminal stenosis at that level.  There is asymmetric left hip arthropathy.  No  acute osseous findings are seen.  IMPRESSION:  1.  Persistent or recurrent perirectal soft tissue stranding, possibly representing proctitis.  Correlate clinically. 2.  No evidence of bowel obstruction or other inflammatory process. 3.  Extensive atherosclerosis. 4.  Lower lumbar spondylosis with severe multifactorial spinal stenosis at L4-L5.   Original Report Authenticated By: Gerrianne Scale, M.D.     Scheduled Meds:   . azithromycin  1 drop Both Eyes Daily  . ciprofloxacin  500 mg Oral BID  . demeclocycline  300 mg Oral Q12H  . feeding supplement  237 mL Oral BID BM  . levothyroxine  75 mcg Oral Q0600  . metroNIDAZOLE  500 mg Oral Q8H  . multivitamin with minerals  1 tablet Oral Daily  . pantoprazole  40 mg Oral Q1200  . pindolol  5 mg Oral BID  . polyethylene glycol  17 g Oral Daily  . polyvinyl alcohol  1 drop Both Eyes QID  . potassium chloride SA  40 mEq Oral Once  . senna-docusate  1 tablet Oral BID  . cyanocobalamin  1,000 mcg Oral Daily  . DISCONTD: furosemide  20 mg Intravenous BID   Continuous Infusions:   Principal Problem:  *Abdominal pain Active Problems:  HYPERTENSION  Hypothyroidism  Hyponatremia  Proctitis

## 2012-04-22 NOTE — Progress Notes (Signed)
Nutrition Follow-up  Intervention:   1. Encouraged protein intake 2. Continue Ensure Complete, pt enjoys those 3. Will add Ensure Pudding po TID, each supplement provides 170 kcal and 4 grams of protein.   Assessment:   Per chart, pt continues with persistent hyponatremia, even after d/c of lasix and initiation of demeclocycline. Team is planning further brain or lung imaging and repeat mammo for neoplasm.  Pt is consuming 25 - 50% of meals.   Diet Order:  Dysphagia 3 with 2 gram sodium salt restriction Supplements: Ensure Complete PO BID  Meds: Scheduled Meds:   . azithromycin  1 drop Both Eyes Daily  . ciprofloxacin  500 mg Oral BID  . feeding supplement  237 mL Oral BID BM  . levothyroxine  75 mcg Oral Q0600  . metroNIDAZOLE  500 mg Oral Q8H  . multivitamin with minerals  1 tablet Oral Daily  . pantoprazole  40 mg Oral Q1200  . pindolol  5 mg Oral BID  . polyethylene glycol  17 g Oral Daily  . polyvinyl alcohol  1 drop Both Eyes QID  . potassium chloride SA  40 mEq Oral Once  . senna-docusate  1 tablet Oral BID  . tolvaptan  15 mg Oral Q24H  . cyanocobalamin  1,000 mcg Oral Daily  . DISCONTD: demeclocycline  300 mg Oral Q12H  . DISCONTD: furosemide  20 mg Intravenous BID   Continuous Infusions:  PRN Meds:.acetaminophen, acetaminophen, guaiFENesin, ondansetron (ZOFRAN) IV, ondansetron, polyvinyl alcohol  Labs:  CMP     Component Value Date/Time   NA 119* 04/22/2012 0608   K 3.4* 04/22/2012 0608   CL 81* 04/22/2012 0608   CO2 33* 04/22/2012 0608   GLUCOSE 107* 04/22/2012 0608   BUN 7 04/22/2012 0608   CREATININE 0.61 04/22/2012 0608   CALCIUM 9.5 04/22/2012 0608   PROT 7.0 04/17/2012 0800   ALBUMIN 3.6 04/22/2012 0608   AST 29 04/17/2012 0800   ALT 14 04/17/2012 0800   ALKPHOS 76 04/17/2012 0800   BILITOT 0.4 04/17/2012 0800   GFRNONAA 80* 04/22/2012 0608   GFRAA >90 04/22/2012 0608   Sodium  Date/Time Value Range Status  04/22/2012  6:08 AM 119* 135 - 145 mEq/L Final   CRITICAL RESULT CALLED TO, READ BACK BY AND VERIFIED WITH:     A.MINTZ,RN 04/22/12 0736 BY BSLADE  04/21/2012  7:20 AM 122* 135 - 145 mEq/L Final  04/20/2012 11:20 AM 121* 135 - 145 mEq/L Final     Intake/Output Summary (Last 24 hours) at 04/22/12 1051 Last data filed at 04/22/12 0800  Gross per 24 hour  Intake    900 ml  Output    120 ml  Net    780 ml    Weight Status:  117 lb - wt up 8 lb x 5 days  Estimated needs:  1235 - 1450 kcal, 48 - 58 grams protein, 1.2 - 1.4 liters daily  Nutrition Dx:  Inadequate oral intake r/t recent GI distress AEB pt report. Ongoing.  Goal: Pt to meet >/= 90% of their estimated nutrition needs;  met  Monitor: weight trends, lab trends, I/O's, PO intake, supplement tolerance  Jarold Motto MS, RD, LDN Pager: [phone] After-hours pager: [phone]

## 2012-04-22 NOTE — Progress Notes (Signed)
Physical Therapy Treatment Patient Details Name: Margaret [name] MRN: [id number] DOB: Apr 13, 1924 Today's Date: 04/22/2012 Time: 4098-1191 PT Time Calculation (min): 25 min  PT Assessment / Plan / Recommendation Comments on Treatment Session  Pt with UTI, proctitis and hyponatremia who continues to progress with mobility but sister present during session today and states that she will only stay for 1-2 days and no other 24 hr supervision after sister leaves. Discussed with pt the potential benefit of HHPT at ALF to increase assist and decrease burden of care. Pt states she wants to return home and has good neighbors. Pt states she wants dgtr involved in any decisions but dgtr [place] at the beach [place]. Pt continues to be supervision level for mobility and does not have 24hr supervision. Will continue to follow.     Follow Up Recommendations  Home health PT;Supervision for mobility/OOB (at ALF if unable to arrange supervision at least during day)    Barriers to Discharge        Equipment Recommendations  Rolling walker with 5" wheels    Recommendations for Other Services    Frequency     Plan Discharge plan needs to be updated    Precautions / Restrictions Precautions Precautions: Fall   Pertinent Vitals/Pain 2/10 burning abdominal pain    Mobility  Bed Mobility Supine to Sit: 6: Modified independent (Device/Increase time);With rails Details for Bed Mobility Assistance: increased time Transfers Sit to Stand: 5: Supervision;From bed Stand to Sit: 5: Supervision;To chair/3-in-1 Details for Transfer Assistance: cueing for hand placement and safety Ambulation/Gait Ambulation/Gait Assistance: 5: Supervision Ambulation Distance (Feet): 400 Feet Assistive device: Rolling walker Ambulation/Gait Assistance Details: cueing for position in RW and posture with cues for direction to room  Gait Pattern: Step-through pattern;Decreased stride length;Trunk flexed Gait velocity:  decreased Stairs: No    Exercises General Exercises - Lower Extremity Long Arc Quad: AROM;Both;20 reps;Seated Hip Flexion/Marching: AROM;Both;20 reps;Seated   PT Diagnosis:    PT Problem List:   PT Treatment Interventions:     PT Goals Acute Rehab PT Goals PT Goal: Sit to Stand - Progress: Progressing toward goal PT Goal: Stand to Sit - Progress: Progressing toward goal PT Goal: Ambulate - Progress: Progressing toward goal  Visit Information  Last PT Received On: 04/22/12 Assistance Needed: +1    Subjective Data  Subjective: I don't want to go to ALF   Cognition  Overall Cognitive Status: Appears within functional limits for tasks assessed/performed Arousal/Alertness: Awake/alert Orientation Level: Appears intact for tasks assessed Behavior During Session: [facility] for tasks performed    Balance     End of Session PT - End of Session Activity Tolerance: Patient tolerated treatment well Patient left: in chair;with call bell/phone within reach;with family/visitor present   GP     Margaret [name] 04/22/2012, 3:08 PM Delaney Meigs, PT [phone]

## 2012-04-23 DIAGNOSIS — J449 Chronic obstructive pulmonary disease, unspecified: Secondary | ICD-10-CM

## 2012-04-23 DIAGNOSIS — E871 Hypo-osmolality and hyponatremia: Secondary | ICD-10-CM

## 2012-04-23 DIAGNOSIS — K5900 Constipation, unspecified: Secondary | ICD-10-CM

## 2012-04-23 DIAGNOSIS — R0902 Hypoxemia: Secondary | ICD-10-CM

## 2012-04-23 DIAGNOSIS — K219 Gastro-esophageal reflux disease without esophagitis: Secondary | ICD-10-CM

## 2012-04-23 DIAGNOSIS — R918 Other nonspecific abnormal finding of lung field: Secondary | ICD-10-CM

## 2012-04-23 LAB — OSMOLALITY: Osmolality: 262 mOsm/kg — ABNORMAL LOW (ref 275–300)

## 2012-04-23 LAB — BASIC METABOLIC PANEL
Calcium: 9.9 mg/dL (ref 8.4–10.5)
Creatinine, Ser: 0.78 mg/dL (ref 0.50–1.10)
GFR calc non Af Amer: 74 mL/min — ABNORMAL LOW (ref >90)
Sodium: 123 mEq/L — ABNORMAL LOW (ref 135–145)

## 2012-04-23 LAB — CREATININE, URINE, RANDOM: Creatinine, Urine: 41.22 mg/dL

## 2012-04-23 LAB — SODIUM, URINE, RANDOM: Sodium, Ur: <10 mEq/L

## 2012-04-23 LAB — RENAL FUNCTION PANEL
BUN: 9 mg/dL (ref 6–23)
CO2: 32 mEq/L (ref 19–32)
Chloride: 88 mEq/L — ABNORMAL LOW (ref 96–112)
GFR calc Af Amer: 89 mL/min — ABNORMAL LOW (ref >90)
Glucose, Bld: 109 mg/dL — ABNORMAL HIGH (ref 70–99)
Potassium: 4.1 mEq/L (ref 3.5–5.1)

## 2012-04-23 LAB — OSMOLALITY, URINE: Osmolality, Ur: 129 mOsm/kg — ABNORMAL LOW (ref 390–1090)

## 2012-04-23 MED ORDER — WHITE PETROLATUM GEL
Status: AC
  Administered 2012-04-23: 22:00:00
  Filled 2012-04-23: qty 5

## 2012-04-23 MED ORDER — CIPROFLOXACIN HCL 500 MG PO TABS
500.0000 mg | ORAL_TABLET | Freq: Two times a day (BID) | ORAL | Status: AC
  Administered 2012-04-23 – 2012-04-26 (×6): 500 mg via ORAL
  Filled 2012-04-23 (×7): qty 1

## 2012-04-23 NOTE — Progress Notes (Signed)
I have seen and examined this patient and agree with plan as outlined by Dr. Everardo Beals.  I suspect that the serum sodium of 114 was spurious as later the level was up to 119 which was the same in am.  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  Await ID and Pulm evaluation.  Will d/c foley and give one more dose of tolvaptan then follow sodium levels.   Deaglan Lile A,MD 04/23/2012 8:57 AM

## 2012-04-23 NOTE — Progress Notes (Signed)
[facility] Progress Note    Subjective:   No acute events overnight.  Continues to c/o dysuria.  No sob/cough.  Wants to go home.   Objective:   BP 139/67  Pulse 78  Temp 97.9 F (36.6 C) (Oral)  Resp 18  Ht 5\' 6"  (1.676 m)  Wt 117 lb 8.1 oz (53.3 kg)  BMI 18.97 kg/m2  SpO2 98%  Intake/Output Summary (Last 24 hours) at 04/23/12 0745 Last data filed at 04/23/12 3086  Gross per 24 hour  Intake   1260 ml  Output   3145 ml  Net  -1885 ml   Weight change: 7.1 oz (0.2 kg)  Physical Exam: Gen:no acute distress, appropriate and cooperative  CVS:RRR, no m/r/g, normal S1S2  Resp:CTA b/l without wheezing/rales/rhonchi  VHQ:IONG, NT/ND, normoactive BS   Ext:no pedal or pretibial edema  Neuro: AAO x3   Imaging: Ct Chest Wo Contrast  04/22/2012  *RADIOLOGY REPORT*  Clinical Data: Nonproductive cough.  Congestion.  CT CHEST WITHOUT CONTRAST  Technique:  Multidetector CT imaging of the chest was performed following the standard protocol without IV contrast.  Comparison: Chest x-ray 04/17/2012.  No prior chest CTs.  Findings:  Mediastinum: Heart size is normal. There is no significant pericardial fluid, thickening or pericardial calcification. There is atherosclerosis of the thoracic aorta, the great vessels of the mediastinum and the coronary arteries, including calcified atherosclerotic plaque in the left anterior descending, left circumflex and right coronary arteries. No pathologically enlarged mediastinal or hilar lymph nodes. Please note that accurate exclusion of hilar adenopathy is limited on noncontrast CT scans. The esophagus appears to be mildly patulous and contains some fluid or debris.  Lungs/Pleura: In the right middle lobe and the lingula there are areas of architectural distortion, mild cylindrical and varicose bronchiectasis, bronchial wall thickening with peribronchovascular ground-glass attenuation and micronodularity.  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  There are a few other tiny 2-3 mm nodules scattered throughout the lungs bilaterally which are highly nonspecific and likely part of the same process.  Mild bilateral apical pleuroparenchymal thickening is compatible with post infectious/inflammatory scarring.  No other larger more suspicious appearing pulmonary nodules or masses are otherwise identified.  No acute consolidative airspace disease.  No pleural effusions.  Upper Abdomen: Atherosclerosis.  Musculoskeletal: There are no aggressive appearing lytic or blastic lesions noted in the visualized portions of the skeleton. Orthopedic fixation hardware in the lower cervical spine is incompletely visualized.  IMPRESSION: 1.  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 2. Atherosclerosis, including three-vessel coronary artery disease. 3.  Patulous esophagus which appears to be filled with fluid and/or debris.   Original Report Authenticated By: Florencia Reasons, M.D.     Labs: BMET  Lab 04/23/12 0530 04/22/12 2108 04/22/12 1356 04/22/12 0608 04/21/12 0720 04/20/12 1120 04/20/12 0600  NA 125* 119* 114* 119* 122* 121* 124*  K 4.1 4.2 3.6 3.4* 3.4* 3.8 3.9  CL 88* 83* 78* 81* 83* 85* 88*  CO2 32 29 30 33* 31 27 30   GLUCOSE 109* 149* 116* 107* 111* 110* 103*  BUN 9 11 6 7 7 6 6   CREATININE 0.69 0.69 0.58 0.61 0.70 0.61 0.65  ALB -- -- -- -- -- -- --  CALCIUM 9.8 9.8 9.4 9.5 9.2 9.4 9.4  PHOS 3.7 -- -- 2.7 3.1 2.9 --  CBC  Lab 04/17/12 0800 04/16/12 1832  WBC 3.7* 4.9  NEUTROABS 1.9 2.4  HGB 10.0* 9.9*  HCT 28.1* 27.2*  MCV 84.1 84.2  PLT 172 189    Medications:      . azithromycin  1 drop Both Eyes Daily  . feeding supplement  237 mL Oral BID BM  . feeding supplement  1 Container Oral TID BM  . levothyroxine  75 mcg Oral Q0600  .  multivitamin with minerals  1 tablet Oral Daily  . pantoprazole  40 mg Oral Q1200  . pindolol  5 mg Oral BID  . polyethylene glycol  17 g Oral Daily  . polyvinyl alcohol  1 drop Both Eyes QID  . potassium chloride SA  40 mEq Oral Once  . senna-docusate  1 tablet Oral BID  . tolvaptan  15 mg Oral Q24H  . cyanocobalamin  1,000 mcg Oral Daily  . DISCONTD: ciprofloxacin  500 mg Oral BID  . DISCONTD: demeclocycline  300 mg Oral Q12H  . DISCONTD: metroNIDAZOLE  500 mg Oral Q8H     Assessment/ Plan:    #Hyponatremia: SIADH. Sodium improving with tolvaptan. Currently IVF is NSL.  Yesterday, urine osm 203 but urine Na = 33.  Serum Osm = 244 yesterday, calculated osm = 246.  Sodium dropped to 114 yesterday and cipro was d/c.  CT chest suggestive of chronic indolent atypical infectious process such as MAI. -Follow urine studies/serum osm today -Continue Tolvaptan 15mg  daily for now -BMET in AM  -ID consult per primary -Can we d/c foley? -Continue to hold losartan 50 daily for now  -Continue 2g sodium to diet  -monitor sodium with AML  -continue to hold any medications that may contribute to hyponatremia- suggest avoiding medications such as NSAIDs, thiazides, and SSRIs that may exacerbate hyponatremia.   #Hypothyroidism: TSH wnl, continue home dose levothyroxine per primary   #Normocytic Anemia: Hb at baseline at admission. Continue to monitor if symptomatic  -Consider repeat b12, folate and ferritin/iron studies if symptomatic   #HTN: Blood pressure adequately managed - per primary, continue pindolol   #Proctitis: Treatment complete - treated with Cipro/Flagyl per primary   #UTI: Cipro per primary d/c yesterday (cx grew e.coli sensitive to cipro, no yeast seen) - adequately treated for uncomplicated UTI  DVT PPX - SCDs    Stacy Gardner, MD 04/23/2012, 7:45 AM

## 2012-04-23 NOTE — Consult Note (Addendum)
Patient: Margaret [name] DOB: 07-22-1924 Date of Admission: 04/16/2012            Pulmonary consult  Date of Consult: 04/23/2012 MD requesting consult: Dr. Sharl Ma Reason for consult: ?MAI   HPI - 78yo female with hx UTI, GERD, chronic hyponatremia and HTN admitted 9/5 r/t hyponatremia, constipation, nausea and vomiting.  Renal has followed pt and rx for SIADH.  CT chest was done to w/u potential malignancy in setting significant hyponatremia and showed ?chronic atypical infection with multiple 2-22mm nodules and PCCM consulted.   Allergies:  Allergies  Allergen Reactions  . Aspirin Hives    High Doses     PMH: Past Medical History  Diagnosis Date  . Unspecified sinusitis (chronic)   . Unspecified essential hypertension   . Cerebrovascular disease, unspecified   . Unspecified venous (peripheral) insufficiency   . Pure hypercholesterolemia   . Unspecified disorder of thyroid   . Esophageal reflux   . Unspecified constipation   . Urinary tract infection, site not specified   . Osteoarthrosis, unspecified whether generalized or localized, unspecified site   . Degeneration of cervical intervertebral disc   . Closed fracture of other bone of wrist   . Anxiety state, unspecified   . Idiopathic urticaria   . Anemia, unspecified   . Other B-complex deficiencies   . Herpes zoster without mention of complication     Home meds: Medications Prior to Admission  Medication Sig Dispense Refill  . aspirin 81 MG tablet Take 81 mg by mouth daily.        Marland Kitchen azithromycin (AZASITE) 1 % ophthalmic solution Place 1 drop into both eyes daily.      . cyanocobalamin 1000 MCG tablet Take 1,000 mcg by mouth daily.       . feeding supplement (ENSURE) PUDG Take 1 Container by mouth daily.       Marland Kitchen guaiFENesin (MUCINEX) 600 MG 12 hr tablet Take 1,200 mg by mouth 2 (two) times daily as needed. For cough.      . levothyroxine (SYNTHROID, LEVOTHROID) 75 MCG tablet Take 75 mcg by mouth daily.      Marland Kitchen losartan  (COZAAR) 50 MG tablet Take 50 mg by mouth daily.      . methylcellulose (ARTIFICIAL TEARS) 1 % ophthalmic solution Place 1 drop into both eyes 4 (four) times daily.      . methylcellulose (ARTIFICIAL TEARS) 1 % ophthalmic solution Place 1 drop into both eyes as needed.      . pindolol (VISKEN) 5 MG tablet Take 5 mg by mouth 2 (two) times daily.      . polyethylene glycol powder (GLYCOLAX/MIRALAX) powder Take 17 g by mouth daily.      . vitamin D, CHOLECALCIFEROL, 400 UNITS tablet Take 400 Units by mouth daily.      . lansoprazole (PREVACID) 30 MG capsule Take 30 mg by mouth daily.          Social Hx: History   Social History  . Marital Status: Widowed    Spouse Name: N/A    Number of Children: 3  . Years of Education: N/A   Occupational History  . Retired    Social History Main Topics  . Smoking status: Never Smoker   . Smokeless tobacco: Never Used  . Alcohol Use: No  . Drug Use: No  . Sexually Active: No   Other Topics Concern  . Not on file   Social History Narrative  . No narrative on file  Family Hx: Family History  Problem Relation Age of Onset  . Diabetes Father   . Heart failure Mother   . Diabetes Sister      ROS: Per HPI.  Denies SOB, chest pain, difficulty swallowing. All other systems reviewed and were neg.   Filed Vitals:   04/22/12 2019 04/23/12 0503 04/23/12 0932 04/23/12 1509  BP: 101/55 139/67 125/65 121/58  Pulse: 72 78 71 72  Temp: 98.5 F (36.9 C) 97.9 F (36.6 C) 98.1 F (36.7 C) 97.3 F (36.3 C)  TempSrc: Oral Oral Oral Oral  Resp: 18 18 20 18   Height:      Weight: 117 lb 8.1 oz (53.3 kg)     SpO2: 94% 98% 97% 97%    Radiology -  Ct Chest Wo Contrast  04/22/2012  *RADIOLOGY REPORT*  Clinical Data: Nonproductive cough.  Congestion.  CT CHEST WITHOUT CONTRAST  Technique:  Multidetector CT imaging of the chest was performed following the standard protocol without IV contrast.  Comparison: Chest x-ray 04/17/2012.  No prior chest  CTs.  Findings:  Mediastinum: Heart size is normal. There is no significant pericardial fluid, thickening or pericardial calcification. There is atherosclerosis of the thoracic aorta, the great vessels of the mediastinum and the coronary arteries, including calcified atherosclerotic plaque in the left anterior descending, left circumflex and right coronary arteries. No pathologically enlarged mediastinal or hilar lymph nodes. Please note that accurate exclusion of hilar adenopathy is limited on noncontrast CT scans. The esophagus appears to be mildly patulous and contains some fluid or debris.  Lungs/Pleura: In the right middle lobe and the lingula there are areas of architectural distortion, mild cylindrical and varicose bronchiectasis, bronchial wall thickening with peribronchovascular ground-glass attenuation and micronodularity.  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  There are a few other tiny 2-3 mm nodules scattered throughout the lungs bilaterally which are highly nonspecific and likely part of the same process.  Mild bilateral apical pleuroparenchymal thickening is compatible with post infectious/inflammatory scarring.  No other larger more suspicious appearing pulmonary nodules or masses are otherwise identified.  No acute consolidative airspace disease.  No pleural effusions.  Upper Abdomen: Atherosclerosis.  Musculoskeletal: There are no aggressive appearing lytic or blastic lesions noted in the visualized portions of the skeleton. Orthopedic fixation hardware in the lower cervical spine is incompletely visualized.  IMPRESSION: 1.  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 2. Atherosclerosis, including three-vessel coronary artery disease. 3.  Patulous esophagus which appears to be filled with fluid and/or  debris.   Original Report Authenticated By: Florencia Reasons, M.D.      CBC    Component Value Date/Time   WBC 3.7* 04/17/2012 0800   RBC 3.34* 04/17/2012 0800   HGB 10.0* 04/17/2012 0800   HCT 28.1* 04/17/2012 0800   PLT 172 04/17/2012 0800   MCV 84.1 04/17/2012 0800   MCH 29.9 04/17/2012 0800   MCHC 35.6 04/17/2012 0800   RDW 14.3 04/17/2012 0800   LYMPHSABS 1.4 04/17/2012 0800   MONOABS 0.3 04/17/2012 0800   EOSABS 0.0 04/17/2012 0800   BASOSABS 0.0 04/17/2012 0800     BMET    Component Value Date/Time   NA 123* 04/23/2012 1411   K 4.0 04/23/2012 1411   CL 86* 04/23/2012 1411   CO2 31 04/23/2012 1411   GLUCOSE 165* 04/23/2012 1411   BUN  12 04/23/2012 1411   CREATININE 0.78 04/23/2012 1411   CALCIUM 9.9 04/23/2012 1411   GFRNONAA 74* 04/23/2012 1411   GFRAA 85* 04/23/2012 1411     EXAM: General:  Elderly, chronically ill appearing female, NAD  Neuro: awake, alert, appropriate, MAE CV: s1s2 rrr PULM: resps even non labored on RA, upper airway noise, bibasilar crackles, no wheeze  GI: abd soft, +bs Extremities:  Warm and dry, no edema    IMPRESSION/ PLAN:  ?Aytpical pulmonary infection/ abnormal CT -- CT findings may also more likely represent chronic aspiration.  Doubt true MAI.   PLAN -  ID consult Sputum for FOBx 3 No indication FOB at this time unless unable to expectorate If ID feels FOB necessary please call back  Swallow eval to r/o aspiration  ??further workup esophageal abnormalities seen on CT for ?malignancy  Cont hyponatremia rx per primary and renal   PCCM signing off, please call back if needed   [facility], NP 04/23/2012  4:35 PM Pager: [phone]  *Care during the described time interval was provided by me and/or other providers on the critical care team. I have reviewed this patient's available data, including medical history, events of note, physical examination and test results as part of my evaluation.  Possibility of [facility] Syndrome is there  but rare.  Patient is able to expectorate.  Recommend follow up on that for AFB prior to serious consideration.  No indication for bronchoscopy at this time.  Very unlikely to be pulmonary malignancy related.  If ID feels that bronchoscopy is necessary then please call PCCM and reconsider.  This is much more likely to be pneumonitis from aspiration rather than a true infection.  PCCM signing off, please call back if needed.  Patient seen and examined, agree with above note.  I dictated the care and orders written for this patient under my direction.  Koren Bound, M.D. [phone]
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
## 2012-04-23 NOTE — Consult Note (Signed)
ANTIBIOTIC CONSULT NOTE-PROGRESS NOTE  Pharmacy Consult for Cipro Restart Indication: UTI  Hospital Problems Principal Problem:  *Abdominal pain Active Problems:  HYPERTENSION  Hypothyroidism  Hyponatremia  Proctitis  Underweight  Patient Measurements: Height: 5\' 6"  (167.6 cm) Weight: 117 lb 8.1 oz (53.3 kg) IBW/kg (Calculated) : 59.3   Vital Signs: BP 121/58  Pulse 72  Temp 97.3 F (36.3 C) (Oral)  Resp 18  Ht 5\' 6"  (1.676 m)  Wt 117 lb 8.1 oz (53.3 kg)  BMI 18.97 kg/m2  SpO2 97%   Labs:  Basename 04/23/12 1411  WBC --  HGB --  PLT --  CREATININE 0.78   Estimated Creatinine Clearance: 42.5 ml/min (by C-G formula based on Cr of 0.78). BUN/Cr/glu/ALT/AST/amyl/lip:  12/0.78/--/--/--/--/-- (09/11 1411)     Microbiology: Recent Results (from the past 720 hour(s))  URINE CULTURE     Status: Normal   Collection Time   04/17/12 10:07 AM      Component Value Status   Specimen Description URINE, CLEAN CATCH  Final   Special Requests NONE  Final   Culture  Setup Time 04/17/2012 10:53  Final   Colony Count >=100,000 COLONIES/ML  Final   Culture ESCHERICHIA COLI  Final   Report Status 04/19/2012 FINAL  Final   Organism ID, Bacteria ESCHERICHIA COLI  Final    Anti-infectives Anti-infectives     Start     Dose/Rate Route Frequency Ordered Stop   04/21/12 1200   demeclocycline (DECLOMYCIN) tablet 300 mg  Status:  Discontinued        300 mg Oral Every 12 hours 04/21/12 1102 04/22/12 0918   04/19/12 2000   ciprofloxacin (CIPRO) tablet 500 mg  Status:  Discontinued        500 mg Oral 2 times daily 04/19/12 1410 04/22/12 1544   04/19/12 1415   metroNIDAZOLE (FLAGYL) tablet 500 mg  Status:  Discontinued        500 mg Oral 3 times per day 04/19/12 1410 04/22/12 1135   04/17/12 0800   ciprofloxacin (CIPRO) IVPB 400 mg  Status:  Discontinued        400 mg 200 mL/hr over 60 Minutes Intravenous Every 12 hours 04/17/12 0707 04/19/12 1410   04/17/12 0715   metroNIDAZOLE  (FLAGYL) IVPB 500 mg  Status:  Discontinued        500 mg 100 mL/hr over 60 Minutes Intravenous 3 times per day 04/17/12 0704 04/19/12 1410          Assessment:  Day # 7 of Antibiotics [total].  All antibiotics were discontinued 04/22/12, including Cipro for E.Coli UTI.  Order received to restart Cipro for continued UTI coverage.  Patient remains HYPOnatremic despite trials of Demeclocycline and Tolvaptan.  As noted by Nephrology, the patient's clinical course is c/w SIADH due to possible atypical mycobacterium pulmonary infection.  Plan:  Restart Cipro at 500 mg po BID.   Follow clinical course and length of therapy.  Laurena Bering, Pharm.D.  04/23/2012 3:55 PM

## 2012-04-23 NOTE — Progress Notes (Signed)
Subjective: Pt is an 78 year old female with history of chronic constipation and chronic hyponatremia, hypertension and hypothyroidism was brought to the ER because of constipation and patient having nausea vomiting. Patient also complained of some nonspecific abdominal pain. As per the patient's daughter who had discussed with the ER physician had stated that patient has been having constipation and she had received some edema at home. Patient also was given additional edema in the ER. Since patient is coming of abdominal pain labs showed mildly elevated lipase CT abdomen and pelvis was done which at this time only shows proctitis probably from the enema received. In addition labs also show sodium of 119. Patient has been admitted for further management. When I examined the patient at this time patient only complains of rectal pain. Denies any chest pain or shortness of breath. Patient denies any new complaints.   Objective: Vital signs in last 24 hours: Temp:  [97.2 F (36.2 C)-98.5 F (36.9 C)] 98.1 F (36.7 C) (09/11 0932) Pulse Rate:  [71-84] 71  (09/11 0932) Resp:  [18-20] 20  (09/11 0932) BP: (101-145)/(55-77) 125/65 mmHg (09/11 0932) SpO2:  [94 %-98 %] 97 % (09/11 0932) Weight:  [53.3 kg (117 lb 8.1 oz)] 53.3 kg (117 lb 8.1 oz) (09/10 2019) Weight change: 0.2 kg (7.1 oz) Last BM Date: 04/20/12  Intake/Output from previous day: 09/10 0701 - 09/11 0700 In: 1260 [P.O.:1260] Out: 3145 [Urine:3145] Total I/O In: 240 [P.O.:240] Out: -    Physical Exam: Head: Normocephalic, atraumatic.  Eyes: No signs of jaundice, EOMI Nose: Mucous membranes dry.  Neck: supple,No deformities, masses, or tenderness noted. Lungs: Normal respiratory effort. B/L Clear to auscultation, no crackles or wheezes.  Heart: Regular RR. S1 and S2 normal  Abdomen: BS normoactive. Soft, Nondistended, non-tender.  Extremities: No pretibial edema, no erythema   Lab Results: Basic Metabolic Panel:  Basename  04/23/12 0530 04/22/12 2108 04/22/12 0608  NA 125* 119* --  K 4.1 4.2 --  CL 88* 83* --  CO2 32 29 --  GLUCOSE 109* 149* --  BUN 9 11 --  CREATININE 0.69 0.69 --  CALCIUM 9.8 9.8 --  MG -- -- --  PHOS 3.7 -- 2.7   Liver Function Tests:  Basename 04/23/12 0530 04/22/12 0608  AST -- --  ALT -- --  ALKPHOS -- --  BILITOT -- --  PROT -- --  ALBUMIN 3.5 3.6   Recent Results (from the past 240 hour(s))  URINE CULTURE     Status: Normal   Collection Time   04/17/12 10:07 AM      Component Value Range Status Comment   Specimen Description URINE, CLEAN CATCH   Final    Special Requests NONE   Final    Culture  Setup Time 04/17/2012 10:53   Final    Colony Count >=100,000 COLONIES/ML   Final    Culture ESCHERICHIA COLI   Final    Report Status 04/19/2012 FINAL   Final    Organism ID, Bacteria ESCHERICHIA COLI   Final     Studies/Results: Ct Chest Wo Contrast  04/22/2012  *RADIOLOGY REPORT*  Clinical Data: Nonproductive cough.  Congestion.  CT CHEST WITHOUT CONTRAST  Technique:  Multidetector CT imaging of the chest was performed following the standard protocol without IV contrast.  Comparison: Chest x-ray 04/17/2012.  No prior chest CTs.  Findings:  Mediastinum: Heart size is normal. There is no significant pericardial fluid, thickening or pericardial calcification. There is atherosclerosis of the thoracic aorta, the  great vessels of the mediastinum and the coronary arteries, including calcified atherosclerotic plaque in the left anterior descending, left circumflex and right coronary arteries. No pathologically enlarged mediastinal or hilar lymph nodes. Please note that accurate exclusion of hilar adenopathy is limited on noncontrast CT scans. The esophagus appears to be mildly patulous and contains some fluid or debris.  Lungs/Pleura: In the right middle lobe and the lingula there are areas of architectural distortion, mild cylindrical and varicose bronchiectasis, bronchial wall  thickening with peribronchovascular ground-glass attenuation and micronodularity.  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  There are a few other tiny 2-3 mm nodules scattered throughout the lungs bilaterally which are highly nonspecific and likely part of the same process.  Mild bilateral apical pleuroparenchymal thickening is compatible with post infectious/inflammatory scarring.  No other larger more suspicious appearing pulmonary nodules or masses are otherwise identified.  No acute consolidative airspace disease.  No pleural effusions.  Upper Abdomen: Atherosclerosis.  Musculoskeletal: There are no aggressive appearing lytic or blastic lesions noted in the visualized portions of the skeleton. Orthopedic fixation hardware in the lower cervical spine is incompletely visualized.  IMPRESSION: 1.  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 2. Atherosclerosis, including three-vessel coronary artery disease. 3.  Patulous esophagus which appears to be filled with fluid and/or debris.   Original Report Authenticated By: Florencia Reasons, M.D.     Medications: Scheduled Meds:   . azithromycin  1 drop Both Eyes Daily  . feeding supplement  237 mL Oral BID BM  . feeding supplement  1 Container Oral TID BM  . levothyroxine  75 mcg Oral Q0600  . multivitamin with minerals  1 tablet Oral Daily  . pantoprazole  40 mg Oral Q1200  . pindolol  5 mg Oral BID  . polyethylene glycol  17 g Oral Daily  . polyvinyl alcohol  1 drop Both Eyes QID  . senna-docusate  1 tablet Oral BID  . tolvaptan  15 mg Oral Q24H  . cyanocobalamin  1,000 mcg Oral Daily  . DISCONTD: ciprofloxacin  500 mg Oral BID   Continuous Infusions:  PRN Meds:.acetaminophen, acetaminophen, guaiFENesin, ondansetron (ZOFRAN) IV, ondansetron, polyvinyl  alcohol  Assessment/Plan:  Principal Problem:  *Abdominal pain Active Problems:  HYPERTENSION  Hypothyroidism  Hyponatremia  Proctitis  Underweight  Hyponatremia Sodium has improved. Tolvaptan has been discontinued. Nephrology following.  ? MAI complex Will get sputum for AFB ID consult. Called pulmonary consult UTI Will continue on cipro  Proctitis: Patient treated with cipro and flagyl     LOS: 7 days   [facility] Pager: [phone] 04/23/2012, 3:05 PM

## 2012-04-24 ENCOUNTER — Inpatient Hospital Stay (HOSPITAL_COMMUNITY): Payer: Medicare Other

## 2012-04-24 LAB — BASIC METABOLIC PANEL
Calcium: 9.7 mg/dL (ref 8.4–10.5)
GFR calc Af Amer: 86 mL/min — ABNORMAL LOW (ref >90)
GFR calc non Af Amer: 74 mL/min — ABNORMAL LOW (ref >90)
Potassium: 4.1 mEq/L (ref 3.5–5.1)
Sodium: 127 mEq/L — ABNORMAL LOW (ref 135–145)

## 2012-04-24 MED ORDER — INFLUENZA VIRUS VACC SPLIT PF IM SUSP
0.5000 mL | INTRAMUSCULAR | Status: AC
  Administered 2012-04-25: 0.5 mL via INTRAMUSCULAR
  Filled 2012-04-24: qty 0.5

## 2012-04-24 MED ORDER — TOLVAPTAN 15 MG PO TABS
15.0000 mg | ORAL_TABLET | ORAL | Status: AC
  Administered 2012-04-24: 15 mg via ORAL
  Filled 2012-04-24: qty 1

## 2012-04-24 NOTE — Consult Note (Signed)
[facility] Consultation Note  Referring Provider: Dr. Mauro Kaufmann Primary Gastroenterology:  Dr. Vida Rigger  Reason for Consultation:  Dysphagia, failure-to-thrive  HPI: Margaret [name] is a 78 y.o. female whom we've been asked to see for dysphagia and failure-to-thrive.  Patient was admitted to hospital one week ago for nausea, vomiting and constipation.  Her constipation is improving, had bowel movement today.  Is having some post-prandial cough as well as some solid-predominant dysphagia.  Upon repeated direct questioning, patient tells me that her dysphagia has been fairly stable and relatively unchanged over the past several months but that her post-prandial cough is new.  Aspiration has apparently been problem for patient now, and in the past.  CT chest showed patulous and fluid-filled distal esophagus.  Speech therapy has seen patient and recommends GI evaluation.  She has had several endoscopies with Dr. Ewing Schlein, last January 2013, showing essentially normal esophagus without stricture/ring, but with dilatation to 15-mm done which has, from time-to-time, at least transiently helped her dysphagia.   Past Medical History  Diagnosis Date  . Unspecified sinusitis (chronic)   . Unspecified essential hypertension   . Cerebrovascular disease, unspecified   . Unspecified venous (peripheral) insufficiency   . Pure hypercholesterolemia   . Unspecified disorder of thyroid   . Esophageal reflux   . Unspecified constipation   . Urinary tract infection, site not specified   . Osteoarthrosis, unspecified whether generalized or localized, unspecified site   . Degeneration of cervical intervertebral disc   . Closed fracture of other bone of wrist   . Anxiety state, unspecified   . Idiopathic urticaria   . Anemia, unspecified   . Other B-complex deficiencies   . Herpes zoster without mention of complication     Past Surgical History  Procedure Date  . Tonsillectomy   . Total  abdominal hysterectomy w/ bilateral salpingoophorectomy   . Cervical fusion   . Cataract extraction   . Nasal sinus surgery   . Esophagogastroduodenoscopy 09/10/2011    Procedure: ESOPHAGOGASTRODUODENOSCOPY (EGD);  Surgeon: Petra Kuba, MD;  Location: [facility] ENDOSCOPY;  Service: Endoscopy;  Laterality: N/A;  c-arm needed  . Savory dilation 09/10/2011    Procedure: SAVORY DILATION;  Surgeon: Petra Kuba, MD;  Location: [facility] ENDOSCOPY;  Service: Endoscopy;  Laterality: N/A;    Prior to Admission medications   Medication Sig Start Date End Date Taking? Authorizing Provider  aspirin 81 MG tablet Take 81 mg by mouth daily.     Yes Historical Provider, MD  azithromycin (AZASITE) 1 % ophthalmic solution Place 1 drop into both eyes daily.   Yes Historical Provider, MD  cyanocobalamin 1000 MCG tablet Take 1,000 mcg by mouth daily.    Yes Historical Provider, MD  feeding supplement (ENSURE) PUDG Take 1 Container by mouth daily.  09/03/11  Yes Salem Senate, PA  guaiFENesin (MUCINEX) 600 MG 12 hr tablet Take 1,200 mg by mouth 2 (two) times daily as needed. For cough.   Yes Historical Provider, MD  levothyroxine (SYNTHROID, LEVOTHROID) 75 MCG tablet Take 75 mcg by mouth daily.   Yes Historical Provider, MD  losartan (COZAAR) 50 MG tablet Take 50 mg by mouth daily.   Yes Historical Provider, MD  methylcellulose (ARTIFICIAL TEARS) 1 % ophthalmic solution Place 1 drop into both eyes 4 (four) times daily.   Yes Historical Provider, MD  methylcellulose (ARTIFICIAL TEARS) 1 % ophthalmic solution Place 1 drop into both eyes as needed.   Yes Historical Provider, MD  pindolol (VISKEN)  5 MG tablet Take 5 mg by mouth 2 (two) times daily.   Yes Pricilla Riffle, MD  polyethylene glycol powder (GLYCOLAX/MIRALAX) powder Take 17 g by mouth daily.   Yes Historical Provider, MD  vitamin D, CHOLECALCIFEROL, 400 UNITS tablet Take 400 Units by mouth daily.   Yes Historical Provider, MD  lansoprazole (PREVACID) 30 MG  capsule Take 30 mg by mouth daily.     Historical Provider, MD    Current Facility-Administered Medications  Medication Dose Route Frequency Provider Last Rate Last Dose  . acetaminophen (TYLENOL) tablet 650 mg  650 mg Oral Q6H PRN Eduard Clos, MD       Or  . acetaminophen (TYLENOL) suppository 650 mg  650 mg Rectal Q6H PRN Eduard Clos, MD      . azithromycin (AZASITE) 1 % ophthalmic solution 1 drop  1 drop Both Eyes Daily Altha Harm, MD   1 drop at 04/24/12 1247  . ciprofloxacin (CIPRO) tablet 500 mg  500 mg Oral BID Ginnie Smart, MD   500 mg at 04/24/12 0757  . feeding supplement (ENSURE COMPLETE) liquid 237 mL  237 mL Oral BID BM Haynes Bast, RD   237 mL at 04/24/12 1245  . feeding supplement (ENSURE) pudding 1 Container  1 Container Oral TID BM Haynes Bast, RD   1 Container at 04/24/12 1248  . guaiFENesin (ROBITUSSIN) 100 MG/5ML solution 100 mg  5 mL Oral Q4H PRN Rolan Lipa, NP      . levothyroxine (SYNTHROID, LEVOTHROID) tablet 75 mcg  75 mcg Oral Q0600 Eduard Clos, MD   75 mcg at 04/24/12 0552  . multivitamin with minerals tablet 1 tablet  1 tablet Oral Daily Haynes Bast, RD   1 tablet at 04/24/12 1248  . ondansetron (ZOFRAN) tablet 4 mg  4 mg Oral Q6H PRN Eduard Clos, MD       Or  . ondansetron [facility]) injection 4 mg  4 mg Intravenous Q6H PRN Eduard Clos, MD      . pantoprazole (PROTONIX) EC tablet 40 mg  40 mg Oral Q1200 Eduard Clos, MD   40 mg at 04/24/12 1245  . pindolol (VISKEN) tablet 5 mg  5 mg Oral BID Eduard Clos, MD   5 mg at 04/24/12 1245  . polyethylene glycol (MIRALAX / GLYCOLAX) packet 17 g  17 g Oral Daily Altha Harm, MD   17 g at 04/24/12 1245  . polyvinyl alcohol (LIQUIFILM TEARS) 1.4 % ophthalmic solution 1 drop  1 drop Both Eyes QID Altha Harm, MD   1 drop at 04/24/12 1444  . polyvinyl alcohol (LIQUIFILM TEARS) 1.4 % ophthalmic solution 1 drop  1 drop  Both Eyes PRN Altha Harm, MD      . senna-docusate (Senokot-S) tablet 1 tablet  1 tablet Oral BID Altha Harm, MD   1 tablet at 04/24/12 1245  . tolvaptan (SAMSCA) tablet 15 mg  15 mg Oral Q24H Irena Cords, MD   15 mg at 04/24/12 1310  . vitamin B-12 (CYANOCOBALAMIN) tablet 1,000 mcg  1,000 mcg Oral Daily Eduard Clos, MD   1,000 mcg at 04/24/12 1248  . white petrolatum (VASELINE) gel             Allergies as of 04/16/2012 - Review Complete 04/16/2012  Allergen Reaction Noted  . Aspirin Hives 04/14/2012    Family History  Problem Relation Age of Onset  .  Diabetes Father   . Heart failure Mother   . Diabetes Sister     History   Social History  . Marital Status: Widowed    Spouse Name: N/A    Number of Children: 3  . Years of Education: N/A   Occupational History  . Retired    Social History Main Topics  . Smoking status: Never Smoker   . Smokeless tobacco: Never Used  . Alcohol Use: No  . Drug Use: No  . Sexually Active: No   Other Topics Concern  . Not on file   Social History Narrative  . No narrative on file    Review of Systems: As per HPI from Dr. Toniann Fail dated 04/17/12, I have reviewed and I agree  Physical Exam: Vital signs in last 24 hours: Temp:  [97.4 F (36.3 C)-98.4 F (36.9 C)] 98.4 F (36.9 C) (09/12 1425) Pulse Rate:  [70-79] 79  (09/12 1425) Resp:  [18] 18  (09/12 1425) BP: (117-145)/(52-69) 117/64 mmHg (09/12 1425) SpO2:  [94 %-98 %] 94 % (09/12 1425) Weight:  [53.887 kg (118 lb 12.8 oz)] 53.887 kg (118 lb 12.8 oz) (09/11 2024) Last BM Date: 04/23/12 General:  Cachectic-appearing with supraclavicular wasting, but is in NAD Head:  Normocephalic and atraumatic. Eyes:  Sclera clear, no icterus.   Conjunctiva pink. Ears:  Normal auditory acuity. Nose:  No deformity, discharge,  or lesions. Mouth:  No deformity or lesions.  Lower lip a bit swollen (? Local trauma)  Oropharynx pink & moist. Neck:  Supple; no  masses or thyromegaly. Lungs:  Clear throughout to auscultation.   No wheezes, crackles, or rhonchi. No acute distress. Heart:  Regular rate and rhythm; no murmurs, clicks, rubs,  or gallops. Abdomen:  Soft, non-tender, does appear a bit distended (she tells me this is her chronic baseline) No masses, hepatosplenomegaly or hernias noted. Faintly hyperactive bowel sounds, without guarding, and without rebound.     Msk:  Diffusely atrophic musculature; Symmetrical without gross deformities. Normal posture. Pulses:  Normal pulses noted. Extremities:  Without clubbing or edema. Neurologic:  Alert and  oriented x4;  Diffusely weak, but otherwise grossly normal neurologically. Skin:  Thin fragile skin with some scattered ecchymoses; otherwise intact without significant lesions or rashes. Psych:  Depressed mood, flat affect   Lab Results: No results found for this basename: WBC:3,HGB:3,HCT:3,PLT:3 in the last 72 hours BMET  [facility] 04/24/12 0645 04/23/12 1411 04/23/12 0530  NA 127* 123* 125*  K 4.1 4.0 4.1  CL 89* 86* 88*  CO2 31 31 32  GLUCOSE 104* 165* 109*  BUN 12 12 9   CREATININE 0.76 0.78 0.69  CALCIUM 9.7 9.9 9.8   LFT  Basename 04/23/12 0530  PROT --  ALBUMIN 3.5  AST --  ALT --  ALKPHOS --  BILITOT --  BILIDIR --  IBILI --   PT/INR No results found for this basename: LABPROT:2,INR:2 in the last 72 hours  Studies/Results: No results found.  Impression:  1.  Dysphagia.  Chronic and, per patient, unchanged for several months.  Suspect this is both oropharyngeal- and generalized dysmotility-related (=presbyesophagus). 2.  Patulous and fluid-filled distal esophagus on CT.  Suspect GERD with regurgitation. 3.  Failure-to-thrive seemingly with recurrent aspiration in past.  Plan:  1.  Unfortunately, I don't feel there is much to be done from GI perspective. 2.  I have offered endoscopy to reinspect the distal esophagus, especially with findings of fluid in distal  esophagus, to ensure there is no  latent stricture needing dilatation.  Patient declines, but the yield of endoscopy in this setting is nevertheless quite low. 3.  Would follow speech therapy guidelines re: diet. 4.  We will revisit tomorrow.  Thank you for the consultation.   LOS: 8 days   Vail Vuncannon M  04/24/2012, 3:27 PM

## 2012-04-24 NOTE — Clinical Social Work Psychosocial (Signed)
     Clinical Social Work Department BRIEF PSYCHOSOCIAL ASSESSMENT 04/24/2012  Patient:  Margaret [name], Margaret [name]     Account Number:  [id number]     Admit date:  04/16/2012  Clinical Social Worker:  Delmer Islam  Date/Time:  04/24/2012 03:13 AM  Referred by:  Care Management  Date Referred:  04/24/2012 Referred for  SNF Placement   Other Referral:   Physical therapist made referral for skilled nursing facility and communicated this with RN case manager.   Interview type:  Patient Other interview type:    PSYCHOSOCIAL DATA Living Status:  ALONE Admitted from facility:   Level of care:   Primary support name:  Claris Che Primary support relationship to patient:  SIBLING Degree of support available:   Sood support per patient. Patient also has a daughter - Charlestine Night, who is currently at the beach and will return home on Saturday. Per patient, daughter is aware that she is hospitalized.    CURRENT CONCERNS Current Concerns  Post-Acute Placement   Other Concerns:    SOCIAL WORK ASSESSMENT / PLAN The initial plan for patient was home with [facility] services or ALF, however this plan changed to SNF for short-term rehab.    CSW advised patient of reson for visit and confirmed that she was in agreement for short term rehab (as told to CSW by RN case manager and physical therapist). Patient has a sister who helps her out but is not able to stay with her 24/7.    Patient reported that she has never been to a skilled nursing facility before and knows nothing about them. Patient anxious about going to rehab due to limited information about facilities and making a decision about placement. CSW advised patient that we cannot make recommendations, however patient encouraged to seek assistance from her sister in choosing a facility. CSW assured the patient that she will be kept informed.   Assessment/plan status:  Psychosocial Support/Ongoing Assessment of Needs Other assessment/ plan:    Information/referral to community resources:   Skilled nursing facility list for [facility].    PATIENTS/FAMILYS RESPONSE TO PLAN OF CARE: Patient was very accepting and appreciative of the services provided by CSW.

## 2012-04-24 NOTE — Progress Notes (Signed)
I have seen and examined this patient and agree with plan as outlined by Dr. Everardo Beals.  Will give one more dose of tolvaptan given fluctuation in serum na yesterday evening.  Will resume fluid restriction tomorrow and await tx for presumed MAI.  Agree with swallowing eval. Anthonee Gelin A,MD 04/24/2012 9:11 AM

## 2012-04-24 NOTE — Progress Notes (Signed)
   CARE MANAGEMENT NOTE 04/24/2012  Patient:  Margaret [name], Margaret [name]   Account Number:  [id number]  Date Initiated:  04/22/2012  Documentation initiated by:  Tareka Jhaveri  Subjective/Objective Assessment:   Pt lives alone  04/24/2012 Per pt evaluation pt would benefit from short term rehab.     Action/Plan:   Per PT eval, sister to assist pt independent, just needs occasional assistance  09/12 Pt unable to get up and down without assistance, would benefit from short term SNF. CSW referral made   Anticipated DC Date:  04/25/2012   Anticipated DC Plan:  SKILLED NURSING FACILITY         Choice offered to / List presented to:             Status of service:  Completed, signed off Medicare Important Message given?   (If response is "NO", the following Medicare IM given date fields will be blank) Date Medicare IM given:   Date Additional Medicare IM given:    Discharge Disposition:  SKILLED NURSING FACILITY  Per UR Regulation:    If discussed at Long Length of Stay Meetings, dates discussed:   04/22/2012  04/24/2012    Comments:  04/24/2012 Pt now with increased weakness, unable to get OOB without assistance. Able to ambulate with walker, however PT recommending rehab 5x per week for increases strengthening and mobility. Pt agreeable and referral made to CSW to assist pt with placement for short term rehab. Sister who would assist is unable to stay with the pt in the evening or at night. Johny Shock RN MPH [phone]   04/22/2012 Pt states that sister will come to assist, pt essentially independent, just needs occassional assistance. MD continues to adjust medications to help to stabalize sodium levels. CRoyal RN MPH Case Manager [phone]

## 2012-04-24 NOTE — Progress Notes (Signed)
INFECTIOUS DISEASE PROGRESS NOTE  ID: Margaret [name] is a 78 y.o. female with   Principal Problem:  *Abdominal pain Active Problems:  HYPERTENSION  Hypothyroidism  Hyponatremia  Proctitis  Underweight  Pulmonary infiltrate  Hypoxemia  Subjective: -------  Abtx:  Anti-infectives     Start     Dose/Rate Route Frequency Ordered Stop   04/23/12 2000   ciprofloxacin (CIPRO) tablet 500 mg        500 mg Oral 2 times daily 04/23/12 1607     04/21/12 1200   demeclocycline (DECLOMYCIN) tablet 300 mg  Status:  Discontinued        300 mg Oral Every 12 hours 04/21/12 1102 04/22/12 0918   04/19/12 2000   ciprofloxacin (CIPRO) tablet 500 mg  Status:  Discontinued        500 mg Oral 2 times daily 04/19/12 1410 04/22/12 1544   04/19/12 1415   metroNIDAZOLE (FLAGYL) tablet 500 mg  Status:  Discontinued        500 mg Oral 3 times per day 04/19/12 1410 04/22/12 1135   04/17/12 0800   ciprofloxacin (CIPRO) IVPB 400 mg  Status:  Discontinued        400 mg 200 mL/hr over 60 Minutes Intravenous Every 12 hours 04/17/12 0707 04/19/12 1410   04/17/12 0715   metroNIDAZOLE (FLAGYL) IVPB 500 mg  Status:  Discontinued        500 mg 100 mL/hr over 60 Minutes Intravenous 3 times per day 04/17/12 0704 04/19/12 1410          Medications:  Scheduled:   . azithromycin  1 drop Both Eyes Daily  . ciprofloxacin  500 mg Oral BID  . feeding supplement  237 mL Oral BID BM  . feeding supplement  1 Container Oral TID BM  . levothyroxine  75 mcg Oral Q0600  . multivitamin with minerals  1 tablet Oral Daily  . pantoprazole  40 mg Oral Q1200  . pindolol  5 mg Oral BID  . polyethylene glycol  17 g Oral Daily  . polyvinyl alcohol  1 drop Both Eyes QID  . senna-docusate  1 tablet Oral BID  . tolvaptan  15 mg Oral Q24H  . cyanocobalamin  1,000 mcg Oral Daily  . white petrolatum        Objective: Vital signs in last 24 hours: Temp:  [97.4 F (36.3 C)-98.4 F (36.9 C)] 98.4 F (36.9 C) (09/12  1425) Pulse Rate:  [70-79] 79  (09/12 1425) Resp:  [18] 18  (09/12 1425) BP: (117-145)/(52-69) 117/64 mmHg (09/12 1425) SpO2:  [94 %-98 %] 94 % (09/12 1425) Weight:  [53.887 kg (118 lb 12.8 oz)] 53.887 kg (118 lb 12.8 oz) (09/11 2024)   General appearance: alert and cooperative  Lab Results  Basename 04/24/12 0645 04/23/12 1411  WBC -- --  HGB -- --  HCT -- --  NA 127* 123*  K 4.1 4.0  CL 89* 86*  CO2 31 31  BUN 12 12  CREATININE 0.76 0.78  GLU -- --   Liver Panel  Basename 04/23/12 0530 04/22/12 0608  PROT -- --  ALBUMIN 3.5 3.6  AST -- --  ALT -- --  ALKPHOS -- --  BILITOT -- --  BILIDIR -- --  IBILI -- --   Sedimentation Rate No results found for this basename: ESRSEDRATE in the last 72 hours C-Reactive Protein No results found for this basename: CRP:2 in the last 72 hours  Microbiology: Recent Results (  from the past 240 hour(s))  URINE CULTURE     Status: Normal   Collection Time   04/17/12 10:07 AM      Component Value Range Status Comment   Specimen Description URINE, CLEAN CATCH   Final    Special Requests NONE   Final    Culture  Setup Time 04/17/2012 10:53   Final    Colony Count >=100,000 COLONIES/ML   Final    Culture ESCHERICHIA COLI   Final    Report Status 04/19/2012 FINAL   Final    Organism ID, Bacteria ESCHERICHIA COLI   Final     Studies/Results: No results found.   Assessment/Plan: Lung Nodules Bronchiectasis Aspiration Hyponatremia UTI  Total days of antibiotics 8 (cipro)  Check BCx AFB Induce sputum for AFB x 3 Stop her anbx for UTI at day 10 Hold MAI therapy pending more data.         Margaret [name] Infectious Diseases [phone] 04/24/2012, 3:15 PM   LOS: 8 days

## 2012-04-24 NOTE — Clinical Social Work Placement (Addendum)
    Clinical Social Work Department CLINICAL SOCIAL WORK PLACEMENT NOTE 04/24/2012  Patient:  Margaret [name], Margaret [name]  Account Number:  [id number] Admit date:  04/16/2012  Clinical Social Worker:  Genelle Bal, LCSW  Date/time:  04/24/2012 05:16 AM  Clinical Social Work is seeking post-discharge placement for this patient at the following level of care:   SKILLED NURSING   (*CSW will update this form in Epic as items are completed)   04/24/2012  Patient/family provided with [facility] Department of Clinical Social Work's list of facilities offering this level of care within the geographic area requested by the patient (or if unable, by the patient's family).  04/24/2012  Patient/family informed of their freedom to choose among providers that offer the needed level of care, that participate in Medicare, Medicaid or managed care program needed by the patient, have an available bed and are willing to accept the patient.    Patient/family informed of [facility]' ownership interest in [facility], as well as of the fact that they are under no obligation to receive care at this facility.  PASARR submitted to EDS on 04/24/2012 PASARR number received from EDS on 04/24/2012  FL2 transmitted to all facilities in geographic area requested by pt/family on  04/24/2012 FL2 transmitted to all facilities within larger geographic area on   Patient informed that his/her managed care company has contracts with or will negotiate with  certain facilities, including the following:     Patient/family informed of bed offers received: 04/25/12  Patient chooses bed at [facility] Physician recommends and patient chooses bed at    Patient to be transferred to [facility] on 04/28/12   Patient to be transferred to facility by ambulance  The following physician request were entered in Epic:  Additional Comments: 04/28/12 - CSW spoke with patient's daughter, Margaret [name] ([phone]) and her husband  Margaret [name] who were at the bedside this morning and daughter completed the admissions paperwork at [facility].  Patient sister Margaret [name] was at the bedside this afternoon as patient prepared to be transported to SNF.

## 2012-04-24 NOTE — Evaluation (Signed)
Clinical/Bedside Swallow Evaluation Patient Details  Name: Margaret [name] MRN: [id number] Date of Birth: 16-Sep-1923  Today's Date: 04/24/2012 Time: 0930-1000 SLP Time Calculation (min): 30 min  Past Medical History:  Past Medical History  Diagnosis Date  . Unspecified sinusitis (chronic)   . Unspecified essential hypertension   . Cerebrovascular disease, unspecified   . Unspecified venous (peripheral) insufficiency   . Pure hypercholesterolemia   . Unspecified disorder of thyroid   . Esophageal reflux   . Unspecified constipation   . Urinary tract infection, site not specified   . Osteoarthrosis, unspecified whether generalized or localized, unspecified site   . Degeneration of cervical intervertebral disc   . Closed fracture of other bone of wrist   . Anxiety state, unspecified   . Idiopathic urticaria   . Anemia, unspecified   . Other B-complex deficiencies   . Herpes zoster without mention of complication    Past Surgical History:  Past Surgical History  Procedure Date  . Tonsillectomy   . Total abdominal hysterectomy w/ bilateral salpingoophorectomy   . Cervical fusion   . Cataract extraction   . Nasal sinus surgery   . Esophagogastroduodenoscopy 09/10/2011    Procedure: ESOPHAGOGASTRODUODENOSCOPY (EGD);  Surgeon: Petra Kuba, MD;  Location: [facility] ENDOSCOPY;  Service: Endoscopy;  Laterality: N/A;  c-arm needed  . Savory dilation 09/10/2011    Procedure: SAVORY DILATION;  Surgeon: Petra Kuba, MD;  Location: [facility] ENDOSCOPY;  Service: Endoscopy;  Laterality: N/A;   HPI:  78yo female with hx UTI, GERD, chronic hyponatremia and HTN admitted 9/5 r/t hyponatremia, constipation, nausea and vomiting.  Renal has followed pt and rx for SIADH.  CT chest was done to w/u potential malignancy in setting significant hyponatremia and showed ?chronic atypical infection with multiple 2-23mm nodules. [facility] reports pt likely with chronic aspiraiton. This finding is consistent with pts history of  chronic dysphagia. In January 2013, MBS showed severe pharyngeal and suspected primary esophageal dysphagia. (Pt has a history of multiple dilatations with Dr. Ewing Schlein). Limited swallow elevation or opening of UES, Cervical hardware pressing of UES, very little bolus clearance through cervical esophagus, silent aspiraiton of thin liquids. Dyspahgia with solids most severe. Pt recommended to consume small sips of thin lqiudis and consume very little pureed solids for pleasure with known risk of aspriation. Pt today has little memory of this, reports she has a little trouble swallowing. Has been eating soft solids at home.    Assessment / Plan / Recommendation Clinical Impression  Pt with consistent presentation from last assessment in January. Pt with weak laryngeal elevation/hyoid excursion, imcomple swallow repsponse. Multiple swallow attempts inidcating severe residual. Posturing to attempt to open UES. Belching, wet vocal qualitya nd soft throat clearing likely due to aspriation/penetration. This is a difficult situation. Pt enjoys eating and does not fully undersatnd severity of dysphagia. At this time would recommend small sips of thin liquids and bits of purees only with esophageal precautions. Aspraiton risk will still be high. Discussed with Dr. Sharl Ma who will consult with GI. SLP will follow for education and treatment as needed.     Aspiration Risk  Severe    Diet Recommendation Dysphagia 1 (Puree);Thin liquid   Liquid Administration via: Cup;No straw Medication Administration: Crushed with puree Supervision: Intermittent supervision to cue for compensatory strategies;Patient able to self feed Compensations: Slow rate;Small sips/bites;Multiple dry swallows after each bite/sip;Clear throat intermittently;Follow solids with liquid Postural Changes and/or Swallow Maneuvers: Seated upright 90 degrees;Upright 30-60 min after meal    Other  Recommendations Recommended Consults: Consider GI  evaluation Oral Care Recommendations: Oral care BID   Follow Up Recommendations   (TBD)    Frequency and Duration min 2x/week  2 weeks   Pertinent Vitals/Pain NA    SLP Swallow Goals Patient will utilize recommended strategies during swallow to increase swallowing safety with: Modified independent assistance Swallow Study Goal #2 - Progress: Progressing toward goal   Swallow Study Prior Functional Status       General HPI: 78yo female with hx UTI, GERD, chronic hyponatremia and HTN admitted 9/5 r/t hyponatremia, constipation, nausea and vomiting.  Renal has followed pt and rx for SIADH.  CT chest was done to w/u potential malignancy in setting significant hyponatremia and showed ?chronic atypical infection with multiple 2-54mm nodules. [facility] reports pt likely with chronic aspiraiton. This finding is consistent with pts history of chronic dysphagia. In January 2013, MBS showed severe pharyngeal and suspected primary esophageal dysphagia. (Pt has a history of multiple dilatations with Dr. Ewing Schlein). Limited swallow elevation or opening of UES, Cervical hardware pressing of UES, very little bolus clearance through cervical esophagus, silent aspiraiton of thin liquids. Dyspahgia with solids most severe. Pt recommended to consume small sips of thin lqiudis and consume very little pureed solids for pleasure with known risk of aspriation. Pt today has little memory of this, reports she has a little trouble swallowing. Has been eating soft solids at home.  Type of Study: Bedside swallow evaluation Previous Swallow Assessment: MBS 1/13 Diet Prior to this Study: Dysphagia 3 (soft);Thin liquids Temperature Spikes Noted: No Respiratory Status: Room air History of Recent Intubation: No Behavior/Cognition: Alert;Cooperative;Pleasant mood Oral Cavity - Dentition: Poor condition Self-Feeding Abilities: Able to feed self Patient Positioning: Upright in bed Baseline Vocal Quality: Clear Volitional Cough:  Strong Volitional Swallow: Able to elicit    Oral/Motor/Sensory Function Overall Oral Motor/Sensory Function: Appears within functional limits for tasks assessed Labial ROM: Within Functional Limits Labial Symmetry: Within Functional Limits (drooping lip bilaterally at rest, adequate strength and move) Labial Strength: Within Functional Limits Lingual ROM: Within Functional Limits Lingual Symmetry: Within Functional Limits Lingual Strength: Within Functional Limits Facial ROM: Within Functional Limits Facial Symmetry: Within Functional Limits Facial Strength: Within Functional Limits Velum: Within Functional Limits Mandible: Within Functional Limits   Ice Chips     Thin Liquid Thin Liquid: Impaired Presentation: Cup;Self Fed Pharyngeal  Phase Impairments: Suspected delayed Swallow;Decreased hyoid-laryngeal movement;Unable to trigger swallow;Multiple swallows;Throat Clearing - Immediate (posturing, extending neck, tiling head back to (get it down))    Nectar Thick Nectar Thick Liquid: Not tested   Honey Thick Honey Thick Liquid: Not tested   Puree Puree: Impaired Presentation: Spoon;Self Fed Pharyngeal Phase Impairments: Multiple swallows;Wet Vocal Quality;Throat Clearing - Immediate;Decreased hyoid-laryngeal movement;Unable to trigger swallow   Solid   GO    Solid: Not tested       Claudine Mouton 04/24/2012,11:01 AM

## 2012-04-24 NOTE — Progress Notes (Signed)
Subjective: Pt is an 78 year old female with history of chronic constipation and chronic hyponatremia, hypertension and hypothyroidism was brought to the ER because of constipation and patient having nausea vomiting. Patient also complained of some nonspecific abdominal pain. As per the patient's daughter who had discussed with the ER physician had stated that patient has been having constipation and she had received some edema at home. Patient also was given additional edema in the ER. Since patient is coming of abdominal pain labs showed mildly elevated lipase CT abdomen and pelvis was done which at this time only shows proctitis probably from the enema received. In addition labs also show sodium of 119. Patient has been admitted for further management.  Patient had CT chest which showed possible MAI complex, ID and pulmonary has seen the patient and plan  is not to start the treatment due to poor tolerance to these medications, also they would fail to provide symptomatic relief. There is also possibility of aspiration pneumonitis, due to recurrent aspiration. She was seen by Dr Ewing Schlein in past.  Today patient denies any chest pain or shortness of breath.  Objective: Vital signs in last 24 hours: Temp:  [97.3 F (36.3 C)-98.4 F (36.9 C)] 97.4 F (36.3 C) (09/12 0530) Pulse Rate:  [70-72] 70  (09/12 0530) Resp:  [18] 18  (09/12 0530) BP: (121-145)/(52-69) 145/69 mmHg (09/12 0530) SpO2:  [96 %-98 %] 97 % (09/12 0530) Weight:  [53.887 kg (118 lb 12.8 oz)] 53.887 kg (118 lb 12.8 oz) (09/11 2024) Weight change: 0.587 kg (1 lb 4.7 oz) Last BM Date: 04/23/12  Intake/Output from previous day: 09/11 0701 - 09/12 0700 In: 600 [P.O.:600] Out: 350 [Urine:350]     Physical Exam: Head: Normocephalic, atraumatic.  Eyes: No signs of jaundice, EOMI Nose: Mucous membranes dry.  Neck: supple,No deformities, masses, or tenderness noted. Lungs: Normal respiratory effort. B/L Clear to auscultation, no  crackles or wheezes.  Heart: Regular RR. S1 and S2 normal  Abdomen: BS normoactive. Soft, Nondistended, non-tender.  Extremities: No pretibial edema, no erythema   Lab Results: Basic Metabolic Panel:  Basename 04/24/12 0645 04/23/12 1411 04/23/12 0530 04/22/12 0608  NA 127* 123* -- --  K 4.1 4.0 -- --  CL 89* 86* -- --  CO2 31 31 -- --  GLUCOSE 104* 165* -- --  BUN 12 12 -- --  CREATININE 0.76 0.78 -- --  CALCIUM 9.7 9.9 -- --  MG -- -- -- --  PHOS -- -- 3.7 2.7   Liver Function Tests:  Basename 04/23/12 0530 04/22/12 0608  AST -- --  ALT -- --  ALKPHOS -- --  BILITOT -- --  PROT -- --  ALBUMIN 3.5 3.6   Recent Results (from the past 240 hour(s))  URINE CULTURE     Status: Normal   Collection Time   04/17/12 10:07 AM      Component Value Range Status Comment   Specimen Description URINE, CLEAN CATCH   Final    Special Requests NONE   Final    Culture  Setup Time 04/17/2012 10:53   Final    Colony Count >=100,000 COLONIES/ML   Final    Culture ESCHERICHIA COLI   Final    Report Status 04/19/2012 FINAL   Final    Organism ID, Bacteria ESCHERICHIA COLI   Final     Studies/Results: No results found.  Medications: Scheduled Meds:    . azithromycin  1 drop Both Eyes Daily  . ciprofloxacin  500 mg  Oral BID  . feeding supplement  237 mL Oral BID BM  . feeding supplement  1 Container Oral TID BM  . levothyroxine  75 mcg Oral Q0600  . multivitamin with minerals  1 tablet Oral Daily  . pantoprazole  40 mg Oral Q1200  . pindolol  5 mg Oral BID  . polyethylene glycol  17 g Oral Daily  . polyvinyl alcohol  1 drop Both Eyes QID  . senna-docusate  1 tablet Oral BID  . tolvaptan  15 mg Oral Q24H  . tolvaptan  15 mg Oral Q24H  . cyanocobalamin  1,000 mcg Oral Daily  . white petrolatum       Continuous Infusions:  PRN Meds:.acetaminophen, acetaminophen, guaiFENesin, ondansetron (ZOFRAN) IV, ondansetron, polyvinyl alcohol  Assessment/Plan:  Principal Problem:   *Abdominal pain Active Problems:  HYPERTENSION  Hypothyroidism  Hyponatremia  Proctitis  Underweight  Pulmonary infiltrate  Hypoxemia  Hyponatremia Sodium has improved. On Tolvapton Nephrology following.  ? MAI complex No treatment per ID Will await  AFB smear results  UTI Will continue on cipro  Proctitis: Patient treated with cipro and flagyl  Dysphagia Speech therapy has evaluated the patient, and she is on dysphagia 1 diet with high risk of aspiration Will consult GI    LOS: 8 days   [facility] Pager: [phone] 04/24/2012, 12:28 PM

## 2012-04-24 NOTE — Progress Notes (Signed)
[facility] Progress Note    Subjective:   No acute events overnight.  Continues to c/o dysuria with urination.   Objective:   BP 145/69  Pulse 70  Temp 97.4 F (36.3 C) (Oral)  Resp 18  Ht 5\' 6"  (1.676 m)  Wt 118 lb 12.8 oz (53.887 kg)  BMI 19.17 kg/m2  SpO2 97%  Intake/Output Summary (Last 24 hours) at 04/24/12 0744 Last data filed at 04/24/12 0600  Gross per 24 hour  Intake    600 ml  Output    350 ml  Net    250 ml   Weight change: 1 lb 4.7 oz (0.587 kg)  Physical Exam: Gen:no acute distress, appropriate and cooperative  CVS:RRR, no m/r/g, normal S1S2  Resp:CTA b/l without wheezing/rales/rhonchi  ZOX:WRUE, NT/ND, normoactive BS  Ext:no pedal or pretibial edema  Neuro: AAO x3   Imaging: Ct Chest Wo Contrast  04/22/2012  *RADIOLOGY REPORT*  Clinical Data: Nonproductive cough.  Congestion.  CT CHEST WITHOUT CONTRAST  Technique:  Multidetector CT imaging of the chest was performed following the standard protocol without IV contrast.  Comparison: Chest x-ray 04/17/2012.  No prior chest CTs.  Findings:  Mediastinum: Heart size is normal. There is no significant pericardial fluid, thickening or pericardial calcification. There is atherosclerosis of the thoracic aorta, the great vessels of the mediastinum and the coronary arteries, including calcified atherosclerotic plaque in the left anterior descending, left circumflex and right coronary arteries. No pathologically enlarged mediastinal or hilar lymph nodes. Please note that accurate exclusion of hilar adenopathy is limited on noncontrast CT scans. The esophagus appears to be mildly patulous and contains some fluid or debris.  Lungs/Pleura: In the right middle lobe and the lingula there are areas of architectural distortion, mild cylindrical and varicose bronchiectasis, bronchial wall thickening with peribronchovascular ground-glass attenuation and micronodularity.  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  There are a few other tiny 2-3 mm nodules scattered throughout the lungs bilaterally which are highly nonspecific and likely part of the same process.  Mild bilateral apical pleuroparenchymal thickening is compatible with post infectious/inflammatory scarring.  No other larger more suspicious appearing pulmonary nodules or masses are otherwise identified.  No acute consolidative airspace disease.  No pleural effusions.  Upper Abdomen: Atherosclerosis.  Musculoskeletal: There are no aggressive appearing lytic or blastic lesions noted in the visualized portions of the skeleton. Orthopedic fixation hardware in the lower cervical spine is incompletely visualized.  IMPRESSION: 1.  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 2. Atherosclerosis, including three-vessel coronary artery disease. 3.  Patulous esophagus which appears to be filled with fluid and/or debris.   Original Report Authenticated By: Florencia Reasons, M.D.     Labs: BMET  Lab 04/24/12 0645 04/23/12 1411 04/23/12 0530 04/22/12 2108 04/22/12 1356 04/22/12 0608 04/21/12 0720 04/20/12 1120  NA 127* 123* 125* 119* 114* 119* 122* --  K 4.1 4.0 4.1 4.2 3.6 3.4* 3.4* --  CL 89* 86* 88* 83* 78* 81* 83* --  CO2 31 31 32 29 30 33* 31 --  GLUCOSE 104* 165* 109* 149* 116* 107* 111* --  BUN 12 12 9 11 6 7 7  --  CREATININE 0.76 0.78 0.69 0.69 0.58 0.61 0.70 --  ALB -- -- -- -- -- -- -- --  CALCIUM 9.7 9.9 9.8 9.8 9.4 9.5 9.2 --  PHOS -- -- 3.7 -- -- 2.7 3.1 2.9   CBC  Lab 04/17/12 0800  WBC 3.7*  NEUTROABS 1.9  HGB 10.0*  HCT 28.1*  MCV 84.1  PLT 172    Medications:      . azithromycin  1 drop Both Eyes Daily  . ciprofloxacin  500 mg Oral BID  . feeding supplement  237 mL Oral BID BM  . feeding supplement  1 Container Oral TID BM  .  levothyroxine  75 mcg Oral Q0600  . multivitamin with minerals  1 tablet Oral Daily  . pantoprazole  40 mg Oral Q1200  . pindolol  5 mg Oral BID  . polyethylene glycol  17 g Oral Daily  . polyvinyl alcohol  1 drop Both Eyes QID  . senna-docusate  1 tablet Oral BID  . tolvaptan  15 mg Oral Q24H  . cyanocobalamin  1,000 mcg Oral Daily  . white petrolatum         Assessment/ Plan:    #Hyponatremia: SIADH. Sodium improving with tolvaptan (urine osm yesterday 129, urine Na <10 & serum osm 262). Currently IVF is NSL.  Cipro restarted by primary.  CT chest suggestive of chronic indolent atypical infectious process & 2-15mm nodules scattered throughout lungs.  Also revealed patulous esophagus with fluid/debris.  ? MAI vs Chronic Aspiration  -Consider another dose Tolvaptan 15mg  daily today -BMET in AM  -ID & pulm consulted yesterday - sputum AFB & FOB -consider swallow study -Continue to hold losartan 50 daily for now  -Continue 2g sodium to diet  -monitor sodium with AML  -continue to hold any medications that may contribute to hyponatremia- suggest avoiding medications such as NSAIDs, thiazides, and SSRIs that may exacerbate hyponatremia.   #Hypothyroidism: TSH wnl, continue home dose levothyroxine per primary   #Normocytic Anemia: Hb at baseline at admission. Continue to monitor if symptomatic  -Consider repeat b12, folate and ferritin/iron studies if symptomatic   #HTN: Blood pressure adequately managed - per primary, continue pindolol   #Proctitis: Treatment complete - treated with Cipro/Flagyl per primary   #UTI: Cipro restarted per primary (has received 8d total); repeat UA since patient had indwelling foley until yesterday and c/o persistent dysuria  DVT PPX - SCDs    Stacy Gardner, MD 04/24/2012, 7:44 AM

## 2012-04-24 NOTE — Progress Notes (Signed)
Physical Therapy Treatment Patient Details Name: Margaret [name] MRN: [id number] DOB: April 28, 1924 Today's Date: 04/24/2012 Time: 8295-6213 PT Time Calculation (min): 30 min  PT Assessment / Plan / Recommendation Comments on Treatment Session  This therapist does not believe pt. safe enough to DC home at current mobility status without 24 hour assist and that she will benefit from 5x/week PT and be safer to ultimately return home alone.      Follow Up Recommendations  Supervision for mobility/OOB;Skilled nursing facility;Supervision/Assistance - 24 hour    Barriers to Discharge        Equipment Recommendations  Defer to next venue;Other (comment) (will likely need RW and 3n1 for home use)    Recommendations for Other Services OT consult  Frequency Min 3X/week   Plan Discharge plan needs to be updated    Precautions / Restrictions Precautions Precautions: Fall Restrictions Weight Bearing Restrictions: No   Pertinent Vitals/Pain No pain, no distress    Mobility  Bed Mobility Bed Mobility: Not assessed Transfers Transfers: Sit to Stand;Stand to Sit Sit to Stand: 4: Min guard;From chair/3-in-1;With armrests Stand to Sit: 4: Min guard;To chair/3-in-1;With armrests Details for Transfer Assistance: cues for hand placement and safety; pt. needs assist to control descent as LE's weak and she is unable to control with own leg strength.  Struggles to stand from surfaces as well. Ambulation/Gait Ambulation/Gait Assistance: 5: Supervision Ambulation Distance (Feet): 200 Feet Assistive device: Rolling walker Ambulation/Gait Assistance Details: Pt. needs assist at times for RW adjustments, safety cues for walking up into RW when at sink washing up.  Forgot and left RW behind once finished washing up and needed reminder to take it with her. Gait Pattern: Step-through pattern;Decreased stride length;Trunk flexed Gait velocity: decreased Stairs: No    Exercises General Exercises - Lower  Extremity Ankle Circles/Pumps: 20 reps;Both;Seated Quad Sets: AROM;Both;20 reps;Seated Long Arc Quad: AROM;Both;20 reps;Seated Hip Flexion/Marching: AROM;Both;20 reps;Seated   PT Diagnosis:    PT Problem List:   PT Treatment Interventions:     PT Goals Acute Rehab PT Goals PT Goal: Sit to Stand - Progress: Progressing toward goal PT Goal: Stand to Sit - Progress: Progressing toward goal PT Goal: Ambulate - Progress: Progressing toward goal  Visit Information  Last PT Received On: 04/24/12 Assistance Needed: +1    Subjective Data  Subjective: "I'm concerned ablut nighttime and being alone"   Cognition  Overall Cognitive Status: Appears within functional limits for tasks assessed/performed Arousal/Alertness: Awake/alert Orientation Level: Appears intact for tasks assessed Behavior During Session: [facility] for tasks performed    Balance     End of Session PT - End of Session Equipment Utilized During Treatment: Gait belt Activity Tolerance: Patient limited by fatigue Patient left: in chair   GP     Ferman Hamming 04/24/2012, 10:51 AM Weldon Picking PT Acute Rehab Services [phone] Beeper [phone]

## 2012-04-25 ENCOUNTER — Inpatient Hospital Stay (HOSPITAL_COMMUNITY): Payer: Medicare Other

## 2012-04-25 LAB — URINALYSIS, ROUTINE W REFLEX MICROSCOPIC
Bilirubin Urine: NEGATIVE
Hgb urine dipstick: NEGATIVE
Nitrite: NEGATIVE
Protein, ur: NEGATIVE mg/dL
Specific Gravity, Urine: 1.006 (ref 1.005–1.030)
Urobilinogen, UA: 0.2 mg/dL (ref 0.0–1.0)

## 2012-04-25 LAB — URINE MICROSCOPIC-ADD ON

## 2012-04-25 LAB — BASIC METABOLIC PANEL
Calcium: 9.8 mg/dL (ref 8.4–10.5)
GFR calc Af Amer: 73 mL/min — ABNORMAL LOW (ref >90)
GFR calc non Af Amer: 63 mL/min — ABNORMAL LOW (ref >90)
Glucose, Bld: 118 mg/dL — ABNORMAL HIGH (ref 70–99)
Potassium: 4.4 mEq/L (ref 3.5–5.1)
Sodium: 130 mEq/L — ABNORMAL LOW (ref 135–145)

## 2012-04-25 LAB — SODIUM, URINE, RANDOM: Sodium, Ur: <10 mEq/L

## 2012-04-25 NOTE — Progress Notes (Signed)
I have seen and examined this patient and agree with plan as outlined by Dr. Everardo Beals.  Have stopped tolvaptan and cont with fluid restriction for now.  Follow daily serum Na. Hiedi Touchton A,MD 04/25/2012 10:07 AM

## 2012-04-25 NOTE — Progress Notes (Signed)
INFECTIOUS DISEASE PROGRESS NOTE  ID: [name] is a 78 y.o. female with Principal Problem:  *Abdominal pain Active Problems:  HYPERTENSION  Hypothyroidism  Hyponatremia  Proctitis  Underweight  Pulmonary infiltrate  Hypoxemia  Subjective: Without complaints.   Abtx:  Anti-infectives     Start     Dose/Rate Route Frequency Ordered Stop   04/23/12 2000   ciprofloxacin (CIPRO) tablet 500 mg        500 mg Oral 2 times daily 04/23/12 1607 04/26/12 1522   04/21/12 1200   demeclocycline (DECLOMYCIN) tablet 300 mg  Status:  Discontinued        300 mg Oral Every 12 hours 04/21/12 1102 04/22/12 0918   04/19/12 2000   ciprofloxacin (CIPRO) tablet 500 mg  Status:  Discontinued        500 mg Oral 2 times daily 04/19/12 1410 04/22/12 1544   04/19/12 1415   metroNIDAZOLE (FLAGYL) tablet 500 mg  Status:  Discontinued        500 mg Oral 3 times per day 04/19/12 1410 04/22/12 1135   04/17/12 0800   ciprofloxacin (CIPRO) IVPB 400 mg  Status:  Discontinued        400 mg 200 mL/hr over 60 Minutes Intravenous Every 12 hours 04/17/12 0707 04/19/12 1410   04/17/12 0715   metroNIDAZOLE (FLAGYL) IVPB 500 mg  Status:  Discontinued        500 mg 100 mL/hr over 60 Minutes Intravenous 3 times per day 04/17/12 0704 04/19/12 1410          Medications:  Scheduled:    . azithromycin  1 drop Both Eyes Daily  . ciprofloxacin  500 mg Oral BID  . feeding supplement  237 mL Oral BID BM  . feeding supplement  1 Container Oral TID BM  . influenza  inactive virus vaccine  0.5 mL Intramuscular Tomorrow-1000  . levothyroxine  75 mcg Oral Q0600  . multivitamin with minerals  1 tablet Oral Daily  . pantoprazole  40 mg Oral Q1200  . pindolol  5 mg Oral BID  . polyethylene glycol  17 g Oral Daily  . polyvinyl alcohol  1 drop Both Eyes QID  . senna-docusate  1 tablet Oral BID  . cyanocobalamin  1,000 mcg Oral Daily    Objective: Vital signs in last 24 hours: Temp:  [97.5 F (36.4 C)-98.5  F (36.9 C)] 98.5 F (36.9 C) (09/13 1522) Pulse Rate:  [60-88] 60  (09/13 1522) Resp:  [17-19] 18  (09/13 1522) BP: (118-166)/(48-76) 166/75 mmHg (09/13 1522) SpO2:  [96 %-99 %] 97 % (09/13 1522) Weight:  [56.246 kg (124 lb)] 56.246 kg (124 lb) (09/12 2125)   General appearance: alert, cooperative and no distress Resp: rhonchi bilaterally Cardio: regular rate and rhythm GI: normal findings: bowel sounds normal and soft, non-tender  Lab Results  Basename 04/25/12 0548 04/24/12 0645  WBC -- --  HGB -- --  HCT -- --  NA 130* 127*  K 4.4 4.1  CL 91* 89*  CO2 32 31  BUN 16 12  CREATININE 0.82 0.76  GLU -- --   Liver Panel  Basename 04/23/12 0530  PROT --  ALBUMIN 3.5  AST --  ALT --  ALKPHOS --  BILITOT --  BILIDIR --  IBILI --   Sedimentation Rate No results found for this basename: ESRSEDRATE in the last 72 hours C-Reactive Protein No results found for this basename: CRP:2 in the last 72 hours  Microbiology:  Recent Results (from the past 240 hour(s))  URINE CULTURE     Status: Normal   Collection Time   04/17/12 10:07 AM      Component Value Range Status Comment   Specimen Description URINE, CLEAN CATCH   Final    Special Requests NONE   Final    Culture  Setup Time 04/17/2012 10:53   Final    Colony Count >=100,000 COLONIES/ML   Final    Culture ESCHERICHIA COLI   Final    Report Status 04/19/2012 FINAL   Final    Organism ID, Bacteria ESCHERICHIA COLI   Final   AFB CULTURE, BLOOD     Status: Normal (Preliminary result)   Collection Time   04/24/12  4:00 PM      Component Value Range Status Comment   Specimen Description BLOOD LEFT ARM   Final    Special Requests 4CC   Final    Culture     Final    Value: CULTURE WILL BE EXAMINED FOR 6 WEEKS BEFORE ISSUING A FINAL REPORT   Report Status PENDING   Incomplete     Studies/Results: Dg Esophagus  04/25/2012  *RADIOLOGY REPORT*  Clinical Data: 78 year old female with dysphagia.  Longstanding history of  esophageal dysphagia.  Previous negative EGD.  ESOPHOGRAM/BARIUM SWALLOW  Technique:  Single contrast examination was performed using thin barium.  Fluoroscopy time:  3.5 minutes.  Comparison:  Chest CT 04/22/2012.  Findings:  The patient unable to stand was placed on the table in the left lateral oblique position with 820 degrees table incline. She was given thin barium by mouth which she tolerated well without apparent difficulty.  Contrast filled the cervical and upper thoracic esophagus to the mid thoracic level.  Virtual complete absence of esophageal motility is noted.  Contrast was maintained in the mid thoracic esophagus without distal flow of contrast for nearly 2 minutes. This is the same level of the esophagus seen be mildly distended with fluid on the earlier CT, and below the level the esophagus appears compressed between the posterior heart and descending thoracic aorta.  After 2 minutes contrast began to transit more distally, and the distal segment of the esophagus is nondilated.  The gastroesophageal junction is within normal limits.  Once distended, the caliber of the distal thoracic esophagus segment appears fairly normal (series 34).  Contrast filling of the stomach appeared within normal limits, the stomach is tonic.  Despite the improved distal transit, the volume of contrast was maintained in the upper thoracic esophagus for the remainder of the study.  The patient was assisted to the seated upright position. Subsequently a 20 minute delayed overhead radiograph demonstrates complete clearance of contrast from the esophagus.  IMPRESSION: Moderate to severe delay in contrast transit through the mid thoracic esophagus appears related to both absent esophageal motility and extrinsic compression on the mid thoracic esophagus from the heart and aorta. See details above.  Eventually, (after 20- minute delay) all contrast passed into the stomach.   Original Report Authenticated By: Harley Hallmark,  M.D.      Assessment/Plan: Lung Nodules  Bronchiectasis  Aspiration  Esophageal Dysfunction Hyponatremia  UTI  Total days of antibiotics 9 (cipro)  Await BCx AFB  Induce sputum for AFB x 3  Stop her anbx for UTI at day 10 (written) Hold MAI therapy pending more data. Explained at length to pt and her family member         Johny Sax Infectious Diseases [phone] 04/25/2012,  4:31 PM   LOS: 9 days  .

## 2012-04-25 NOTE — Progress Notes (Signed)
INFECTIOUS DISEASE PROGRESS NOTE  ID: Margaret [name] is a 78 y.o. female with Principal Problem:  *Abdominal pain Active Problems:  HYPERTENSION  Hypothyroidism  Hyponatremia  Proctitis  Underweight  Pulmonary infiltrate  Hypoxemia  Subjective: Without complaints.   Abtx:  Anti-infectives     Start     Dose/Rate Route Frequency Ordered Stop   04/23/12 2000   ciprofloxacin (CIPRO) tablet 500 mg        500 mg Oral 2 times daily 04/23/12 1607 04/26/12 1522   04/21/12 1200   demeclocycline (DECLOMYCIN) tablet 300 mg  Status:  Discontinued        300 mg Oral Every 12 hours 04/21/12 1102 04/22/12 0918   04/19/12 2000   ciprofloxacin (CIPRO) tablet 500 mg  Status:  Discontinued        500 mg Oral 2 times daily 04/19/12 1410 04/22/12 1544   04/19/12 1415   metroNIDAZOLE (FLAGYL) tablet 500 mg  Status:  Discontinued        500 mg Oral 3 times per day 04/19/12 1410 04/22/12 1135   04/17/12 0800   ciprofloxacin (CIPRO) IVPB 400 mg  Status:  Discontinued        400 mg 200 mL/hr over 60 Minutes Intravenous Every 12 hours 04/17/12 0707 04/19/12 1410   04/17/12 0715   metroNIDAZOLE (FLAGYL) IVPB 500 mg  Status:  Discontinued        500 mg 100 mL/hr over 60 Minutes Intravenous 3 times per day 04/17/12 0704 04/19/12 1410          Medications:  Scheduled:   . azithromycin  1 drop Both Eyes Daily  . ciprofloxacin  500 mg Oral BID  . feeding supplement  237 mL Oral BID BM  . feeding supplement  1 Container Oral TID BM  . influenza  inactive virus vaccine  0.5 mL Intramuscular Tomorrow-1000  . levothyroxine  75 mcg Oral Q0600  . multivitamin with minerals  1 tablet Oral Daily  . pantoprazole  40 mg Oral Q1200  . pindolol  5 mg Oral BID  . polyethylene glycol  17 g Oral Daily  . polyvinyl alcohol  1 drop Both Eyes QID  . senna-docusate  1 tablet Oral BID  . cyanocobalamin  1,000 mcg Oral Daily    Objective: Vital signs in last 24 hours: Temp:  [97.5 F (36.4 C)-98.5  F (36.9 C)] 98.5 F (36.9 C) (09/13 1522) Pulse Rate:  [60-88] 60  (09/13 1522) Resp:  [17-19] 18  (09/13 1522) BP: (118-166)/(48-76) 166/75 mmHg (09/13 1522) SpO2:  [96 %-99 %] 97 % (09/13 1522) Weight:  [56.246 kg (124 lb)] 56.246 kg (124 lb) (09/12 2125)   General appearance: alert, cooperative and no distress Resp: rhonchi bilaterally Cardio: regular rate and rhythm GI: normal findings: bowel sounds normal and soft, non-tender  Lab Results  Basename 04/25/12 0548 04/24/12 0645  WBC -- --  HGB -- --  HCT -- --  NA 130* 127*  K 4.4 4.1  CL 91* 89*  CO2 32 31  BUN 16 12  CREATININE 0.82 0.76  GLU -- --   Liver Panel  Basename 04/23/12 0530  PROT --  ALBUMIN 3.5  AST --  ALT --  ALKPHOS --  BILITOT --  BILIDIR --  IBILI --   Sedimentation Rate No results found for this basename: ESRSEDRATE in the last 72 hours C-Reactive Protein No results found for this basename: CRP:2 in the last 72 hours  Microbiology: Recent  Results (from the past 240 hour(s))  URINE CULTURE     Status: Normal   Collection Time   04/17/12 10:07 AM      Component Value Range Status Comment   Specimen Description URINE, CLEAN CATCH   Final    Special Requests NONE   Final    Culture  Setup Time 04/17/2012 10:53   Final    Colony Count >=100,000 COLONIES/ML   Final    Culture ESCHERICHIA COLI   Final    Report Status 04/19/2012 FINAL   Final    Organism ID, Bacteria ESCHERICHIA COLI   Final   AFB CULTURE, BLOOD     Status: Normal (Preliminary result)   Collection Time   04/24/12  4:00 PM      Component Value Range Status Comment   Specimen Description BLOOD LEFT ARM   Final    Special Requests 4CC   Final    Culture     Final    Value: CULTURE WILL BE EXAMINED FOR 6 WEEKS BEFORE ISSUING A FINAL REPORT   Report Status PENDING   Incomplete     Studies/Results: Dg Esophagus  04/25/2012  *RADIOLOGY REPORT*  Clinical Data: 78 year old female with dysphagia.  Longstanding history of  esophageal dysphagia.  Previous negative EGD.  ESOPHOGRAM/BARIUM SWALLOW  Technique:  Single contrast examination was performed using thin barium.  Fluoroscopy time:  3.5 minutes.  Comparison:  Chest CT 04/22/2012.  Findings:  The patient unable to stand was placed on the table in the left lateral oblique position with 820 degrees table incline. She was given thin barium by mouth which she tolerated well without apparent difficulty.  Contrast filled the cervical and upper thoracic esophagus to the mid thoracic level.  Virtual complete absence of esophageal motility is noted.  Contrast was maintained in the mid thoracic esophagus without distal flow of contrast for nearly 2 minutes. This is the same level of the esophagus seen be mildly distended with fluid on the earlier CT, and below the level the esophagus appears compressed between the posterior heart and descending thoracic aorta.  After 2 minutes contrast began to transit more distally, and the distal segment of the esophagus is nondilated.  The gastroesophageal junction is within normal limits.  Once distended, the caliber of the distal thoracic esophagus segment appears fairly normal (series 34).  Contrast filling of the stomach appeared within normal limits, the stomach is tonic.  Despite the improved distal transit, the volume of contrast was maintained in the upper thoracic esophagus for the remainder of the study.  The patient was assisted to the seated upright position. Subsequently a 20 minute delayed overhead radiograph demonstrates complete clearance of contrast from the esophagus.  IMPRESSION: Moderate to severe delay in contrast transit through the mid thoracic esophagus appears related to both absent esophageal motility and extrinsic compression on the mid thoracic esophagus from the heart and aorta. See details above.  Eventually, (after 20- minute delay) all contrast passed into the stomach.   Original Report Authenticated By: Harley Hallmark,  M.D.      Assessment/Plan: Lung Nodules  Bronchiectasis  Aspiration  Esophageal Dysfunction Hyponatremia  UTI  Total days of antibiotics 9 (cipro)  Await BCx AFB  Induce sputum for AFB x 3  Stop her anbx for UTI at day 10 (written) Hold MAI therapy pending more data.          Johny Sax Infectious Diseases [phone] 04/25/2012, 4:15 PM   LOS: 9 days  .

## 2012-04-25 NOTE — Progress Notes (Signed)
Speech Language Pathology Dysphagia Treatment Patient Details Name: Margaret [name] MRN: [id number] DOB: [birth year] Today's Date: 04/25/2012 Time: 1600-1630 SLP Time Calculation (min): 30 min  Assessment / Plan / Recommendation Clinical Impression  SLP f/u after pts Esophagram. Await GI recommendations. Does appear that pt has a signficant esophageal and pharyngeal dysphagia. SLP provided extensive eduation to pt and sister regarding esopahgeal precautions. Suspect pt unlikely to have any effective treatment or resolution to this problem prior to d/c. Suggested thin liquids with strategies as the safest option though pt is sure she would want to continue consumption of solid foods for comfort. SLP provided diet suggestions with this in mind. Pt would benefit from further education from pulmonologist regarding severity of aspiration pneumonitis if that is truly the most likely diagnosis.     Diet Recommendation  Continue with Current Diet: Dysphagia 1 (puree);Thin liquid    SLP Plan Continue with current plan of care   Pertinent Vitals/Pain NA   Swallowing Goals  SLP Swallowing Goals Patient will utilize recommended strategies during swallow to increase swallowing safety with: Modified independent assistance Swallow Study Goal #2 - Progress: Progressing toward goal  General    Oral Cavity - Oral Hygiene     Dysphagia Treatment Treatment focused on: Patient/family/caregiver education Family/Caregiver Educated: sister Patient observed directly with PO's: No Reason PO's not observed: Other (comment) (focus on education/compensatory strategies. )   GO     Delvina Mizzell, Riley Nearing 04/25/2012, 4:51 PM

## 2012-04-25 NOTE — Clinical Social Work Note (Addendum)
MD contacted by charge nurse to determine possible discharge date. Per MD patient will D/C early next week. CSW will continue to monitor patient progress and facilitate discharge to a SNF for short-term rehab. [name] contacted and message left to determine if bed will be available on Monday.  CSW was able to speak with [name] in admissions at [name] later same date and she anticipates having an available bed on Monday - 9/16 if patient ready for discharge. Patient and her sister, who was at the bedside advised that facility has an available bed on Monday.  Margaret [name], MSW, LCSW [phone]

## 2012-04-25 NOTE — Progress Notes (Signed)
Subjective: Pt is an 78 year old female with history of chronic constipation and chronic hyponatremia, hypertension and hypothyroidism was brought to the ER because of constipation and patient having nausea vomiting. Patient also complained of some nonspecific abdominal pain. As per the patient's daughter who had discussed with the ER physician had stated that patient has been having constipation and she had received some edema at home. Patient also was given additional edema in the ER. Since patient is coming of abdominal pain labs showed mildly elevated lipase CT abdomen and pelvis was done which at this time only shows proctitis probably from the enema received. In addition labs also show sodium of 119. Patient has been admitted for further management.  Patient had CT chest which showed possible MAI complex, ID and pulmonary has seen the patient and plan  is not to start the treatment due to poor tolerance to these medications, also they would fail to provide symptomatic relief. There is also possibility of aspiration pneumonitis, due to recurrent aspiration. She was seen by Dr Ewing Schlein in past.  Today patient denies any chest pain or shortness of breath.  Objective: Vital signs in last 24 hours: Temp:  [97.6 F (36.4 C)-98.5 F (36.9 C)] 97.8 F (36.6 C) (09/13 1752) Pulse Rate:  [60-88] 73  (09/13 1752) Resp:  [17-19] 18  (09/13 1752) BP: (118-166)/(48-75) 152/73 mmHg (09/13 1752) SpO2:  [96 %-99 %] 96 % (09/13 1752) Weight:  [51.9 kg (114 lb 6.7 oz)-56.246 kg (124 lb)] 51.9 kg (114 lb 6.7 oz) (09/13 2008) Weight change: 2.359 kg (5 lb 3.2 oz) Last BM Date: 04/23/12  Intake/Output from previous day: 09/12 0701 - 09/13 0700 In: 480 [P.O.:480] Out: 500 [Urine:500]     Physical Exam: Head: Normocephalic, atraumatic.  Eyes: No signs of jaundice, EOMI Nose: Mucous membranes dry.  Neck: supple,No deformities, masses, or tenderness noted. Lungs: Normal respiratory effort. B/L Clear to  auscultation, no crackles or wheezes.  Heart: Regular RR. S1 and S2 normal  Abdomen: BS normoactive. Soft, Nondistended, non-tender.  Extremities: No pretibial edema, no erythema   Lab Results: Basic Metabolic Panel:  Basename 04/25/12 0548 04/24/12 0645 04/23/12 0530  NA 130* 127* --  K 4.4 4.1 --  CL 91* 89* --  CO2 32 31 --  GLUCOSE 118* 104* --  BUN 16 12 --  CREATININE 0.82 0.76 --  CALCIUM 9.8 9.7 --  MG -- -- --  PHOS -- -- 3.7   Liver Function Tests:  Basename 04/23/12 0530  AST --  ALT --  ALKPHOS --  BILITOT --  PROT --  ALBUMIN 3.5   Recent Results (from the past 240 hour(s))  URINE CULTURE     Status: Normal   Collection Time   04/17/12 10:07 AM      Component Value Range Status Comment   Specimen Description URINE, CLEAN CATCH   Final    Special Requests NONE   Final    Culture  Setup Time 04/17/2012 10:53   Final    Colony Count >=100,000 COLONIES/ML   Final    Culture ESCHERICHIA COLI   Final    Report Status 04/19/2012 FINAL   Final    Organism ID, Bacteria ESCHERICHIA COLI   Final   AFB CULTURE, BLOOD     Status: Normal (Preliminary result)   Collection Time   04/24/12  4:00 PM      Component Value Range Status Comment   Specimen Description BLOOD LEFT ARM   Final  Special Requests 4CC   Final    Culture     Final    Value: CULTURE WILL BE EXAMINED FOR 6 WEEKS BEFORE ISSUING A FINAL REPORT   Report Status PENDING   Incomplete     Studies/Results: Dg Esophagus  04/25/2012  *RADIOLOGY REPORT*  Clinical Data: 78 year old female with dysphagia.  Longstanding history of esophageal dysphagia.  Previous negative EGD.  ESOPHOGRAM/BARIUM SWALLOW  Technique:  Single contrast examination was performed using thin barium.  Fluoroscopy time:  3.5 minutes.  Comparison:  Chest CT 04/22/2012.  Findings:  The patient unable to stand was placed on the table in the left lateral oblique position with 820 degrees table incline. She was given thin barium by mouth  which she tolerated well without apparent difficulty.  Contrast filled the cervical and upper thoracic esophagus to the mid thoracic level.  Virtual complete absence of esophageal motility is noted.  Contrast was maintained in the mid thoracic esophagus without distal flow of contrast for nearly 2 minutes. This is the same level of the esophagus seen be mildly distended with fluid on the earlier CT, and below the level the esophagus appears compressed between the posterior heart and descending thoracic aorta.  After 2 minutes contrast began to transit more distally, and the distal segment of the esophagus is nondilated.  The gastroesophageal junction is within normal limits.  Once distended, the caliber of the distal thoracic esophagus segment appears fairly normal (series 34).  Contrast filling of the stomach appeared within normal limits, the stomach is tonic.  Despite the improved distal transit, the volume of contrast was maintained in the upper thoracic esophagus for the remainder of the study.  The patient was assisted to the seated upright position. Subsequently a 20 minute delayed overhead radiograph demonstrates complete clearance of contrast from the esophagus.  IMPRESSION: Moderate to severe delay in contrast transit through the mid thoracic esophagus appears related to both absent esophageal motility and extrinsic compression on the mid thoracic esophagus from the heart and aorta. See details above.  Eventually, (after 20- minute delay) all contrast passed into the stomach.   Original Report Authenticated By: Harley Hallmark, M.D.     Medications: Scheduled Meds:    . azithromycin  1 drop Both Eyes Daily  . ciprofloxacin  500 mg Oral BID  . feeding supplement  237 mL Oral BID BM  . feeding supplement  1 Container Oral TID BM  . influenza  inactive virus vaccine  0.5 mL Intramuscular Tomorrow-1000  . levothyroxine  75 mcg Oral Q0600  . multivitamin with minerals  1 tablet Oral Daily  .  pantoprazole  40 mg Oral Q1200  . pindolol  5 mg Oral BID  . polyethylene glycol  17 g Oral Daily  . polyvinyl alcohol  1 drop Both Eyes QID  . senna-docusate  1 tablet Oral BID  . cyanocobalamin  1,000 mcg Oral Daily   Continuous Infusions:  PRN Meds:.acetaminophen, acetaminophen, guaiFENesin, ondansetron (ZOFRAN) IV, ondansetron, polyvinyl alcohol  Assessment/Plan:  Principal Problem:  *Abdominal pain Active Problems:  HYPERTENSION  Hypothyroidism  Hyponatremia  Proctitis  Underweight  Pulmonary infiltrate  Hypoxemia  Hyponatremia Sodium has improved. On Tolvapton Nephrology following.  ? MAI complex No treatment per ID Will await  AFB smear results  UTI Will continue on cipro  Proctitis: Patient treated with cipro and flagyl  Dysphagia Speech therapy has evaluated the patient, and she is on dysphagia 1 diet with high risk of aspiration Esophagram shows significant  esophageal and pharyngeal dysphagia. Await GI recommendation  Palliative medicine team consulted for goals of care..    LOS: 9 days   [facility] Pager: [phone] 04/25/2012, 8:13 PM

## 2012-04-25 NOTE — Progress Notes (Signed)
[facility] Progress Note    Subjective:   No acute events overnight.  Seen by GI yesterday. No c/o dysuria today, but reports increased frequency.   Objective:   BP 120/48  Pulse 73  Temp 97.6 F (36.4 C) (Oral)  Resp 17  Ht 5\' 6"  (1.676 m)  Wt 124 lb (56.246 kg)  BMI 20.01 kg/m2  SpO2 99%  Intake/Output Summary (Last 24 hours) at 04/25/12 0753 Last data filed at 04/25/12 0217  Gross per 24 hour  Intake    480 ml  Output    500 ml  Net    -20 ml   Weight change: 5 lb 3.2 oz (2.359 kg)  Physical Exam: Gen:no acute distress, appropriate and cooperative  CVS:RRR, no m/r/g, normal S1S2  Resp:CTA b/l without wheezing/rales/rhonchi  ZOX:WRUE, NT/ND, normoactive BS  Ext:no pedal or pretibial edema  Neuro: AAO x3  Imaging: No results found.  Labs: BMET  Lab 04/25/12 0548 04/24/12 0645 04/23/12 1411 04/23/12 0530 04/22/12 2108 04/22/12 1356 04/22/12 0608 04/21/12 0720 04/20/12 1120  NA 130* 127* 123* 125* 119* 114* 119* -- --  K 4.4 4.1 4.0 4.1 4.2 3.6 3.4* -- --  CL 91* 89* 86* 88* 83* 78* 81* -- --  CO2 32 31 31 32 29 30 33* -- --  GLUCOSE 118* 104* 165* 109* 149* 116* 107* -- --  BUN 16 12 12 9 11 6 7  -- --  CREATININE 0.82 0.76 0.78 0.69 0.69 0.58 0.61 -- --  ALB -- -- -- -- -- -- -- -- --  CALCIUM 9.8 9.7 9.9 9.8 9.8 9.4 9.5 -- --  PHOS -- -- -- 3.7 -- -- 2.7 3.1 2.9   CBC No results found for this basename: WBC:4,NEUTROABS:4,HGB:4,HCT:4,MCV:4,PLT:4 in the last 168 hours  Medications:      . azithromycin  1 drop Both Eyes Daily  . ciprofloxacin  500 mg Oral BID  . feeding supplement  237 mL Oral BID BM  . feeding supplement  1 Container Oral TID BM  . influenza  inactive virus vaccine  0.5 mL Intramuscular Tomorrow-1000  . levothyroxine  75 mcg Oral Q0600  . multivitamin with minerals  1 tablet Oral Daily  . pantoprazole  40 mg Oral Q1200  . pindolol  5 mg Oral BID  . polyethylene glycol  17 g Oral Daily  . polyvinyl alcohol  1 drop  Both Eyes QID  . senna-docusate  1 tablet Oral BID  . tolvaptan  15 mg Oral Q24H  . cyanocobalamin  1,000 mcg Oral Daily     Assessment/ Plan:    #Hyponatremia: SIADH. Sodium improving with tolvaptan (urine osm today 143, urine Na <10). Currently IVF is NSL. Cipro restarted by primary (day 9/10). CT chest suggestive of chronic indolent atypical infectious process & 2-47mm nodules scattered throughout lungs. ? MAI vs Chronic Aspiration; Apparently patient has had problems with aspiration in the past and is likely contributing to chronic SIADH (Seen by GI yesterday & in the past for chronic dysphagia - Dr. Ewing Schlein performed several EGDs and esophageal dilatation to 15mm) Patient declined repeat endoscopy - presume chronic aspiration, follow speech recs -Restart fluid restriction today; consider salt tabs at discharge -Follow sodium -follow AFB cx - appreciate ID input - MAI tx held for now -Continue to hold losartan 50 daily for now  -Continue 2g sodium to diet  -continue to hold any medications that may contribute to hyponatremia- suggest avoiding medications such as NSAIDs, thiazides, and SSRIs that  may exacerbate hyponatremia.   #Hypothyroidism: TSH wnl, continue home dose levothyroxine per primary   #Normocytic Anemia: Hb at baseline at admission. Continue to monitor if symptomatic  -Consider repeat b12, folate and ferritin/iron studies if symptomatic   #HTN: Blood pressure adequately managed - per primary, continue pindolol   #Proctitis: Treatment complete - treated with Cipro/Flagyl per primary   #UTI: Cipro restarted per primary (has received 9d total); urine micro suggests many squams (rare bacteria, 3-6 WBC) - UA with trace leukocytes -Follow urine culture for resistance  -Anticipate tx with Cipro for 10d total  DVT PPX - SCDs   Dispo: plan to d/c to SNF for rehab   Margaret Gardner, MD 04/25/2012, 7:53 AM

## 2012-04-25 NOTE — Progress Notes (Signed)
Patient would like to have further evaluation of her swallowing function. There is clearly an oropharyngeal dysfunction component, as evidenced by the speech therapist's bedside swallowing evaluation. The question is whether there is also an esophageal component, as there has been some question of in the past, although no stricture has been identified endoscopically.  I feel that barium swallow with a tablet would help to determine whether there is a problem with esophageal motility, and/or any correctable anatomic problem such as a stricture. This will be arranged for later today.  Florencia Reasons, M.D. [phone]

## 2012-04-25 NOTE — Progress Notes (Signed)
Physical Therapy Treatment Patient Details Name: Margaret [name] MRN: [id number] DOB: April 01, 1924 Today's Date: 04/25/2012 Time: 4098-1191 PT Time Calculation (min): 25 min  PT Assessment / Plan / Recommendation Comments on Treatment Session  Pt. has increasing tolerance of activity today, though still needs ongoing PT 5x/week.  Pt. wth improved spirits today.      Follow Up Recommendations  Supervision for mobility/OOB;Skilled nursing facility;Supervision/Assistance - 24 hour    Barriers to Discharge        Equipment Recommendations  Defer to next venue;Other (comment)    Recommendations for Other Services    Frequency Min 3X/week   Plan Discharge plan remains appropriate    Precautions / Restrictions Precautions Precautions: Fall Restrictions Weight Bearing Restrictions: No   Pertinent Vitals/Pain Reports generalized discomfort in right shoulder area, no pain med requested.  MD in room and aware.    Mobility  Bed Mobility Bed Mobility: Not assessed Transfers Transfers: Sit to Stand;Stand to Sit Sit to Stand: 4: Min assist;From chair/3-in-1;With armrests Stand to Sit: 4: Min guard;To chair/3-in-1;With armrests Details for Transfer Assistance: Pt. practiced 5 trials of sit<>stand requiring rests between each attempt.  Pt. needs ongoing assist due to LE weakness, and is noted to have increased forward trunk flexion to compensate for LE weakness.  Fatigues with this task. Ambulation/Gait Ambulation/Gait Assistance: 5: Supervision;4: Min assist Ambulation Distance (Feet): 250 Feet Assistive device: Rolling walker;None Ambulation/Gait Assistance Details: Pt. demos multiple losses of balance, especially when she is moving and an object around her also moves.  Instability worse without device, but she has instability even with device and is at risk of fall. Gait Pattern: Step-through pattern;Decreased stride length;Trunk flexed Gait velocity: decreased General Gait Details:  sways, takes cross over steps to stabilize self Stairs: No    Exercises General Exercises - Lower Extremity Ankle Circles/Pumps: 20 reps;Both;Seated Long Arc Quad: AROM;Both;20 reps;Seated Hip Flexion/Marching: AROM;Both;20 reps;Seated   PT Diagnosis:    PT Problem List:   PT Treatment Interventions:     PT Goals Acute Rehab PT Goals PT Goal: Sit to Stand - Progress: Progressing toward goal PT Goal: Stand to Sit - Progress: Progressing toward goal PT Goal: Ambulate - Progress: Progressing toward goal  Visit Information  Last PT Received On: 04/25/12 Assistance Needed: +1    Subjective Data  Subjective: "I'm feeling a little better"   Cognition  Overall Cognitive Status: Appears within functional limits for tasks assessed/performed Arousal/Alertness: Awake/alert Orientation Level: Appears intact for tasks assessed Behavior During Session: Margaret [name] for tasks performed    Balance     End of Session PT - End of Session Equipment Utilized During Treatment: Gait belt Activity Tolerance: Patient limited by fatigue;Patient tolerated treatment well Patient left: in chair;with call bell/phone within reach Nurse Communication: Mobility status   GP     Margaret [name] 04/25/2012, 8:41 AM Margaret [name] PT Acute Rehab Services [phone] [place] [phone]

## 2012-04-26 DIAGNOSIS — R1310 Dysphagia, unspecified: Secondary | ICD-10-CM

## 2012-04-26 LAB — BASIC METABOLIC PANEL
BUN: 14 mg/dL (ref 6–23)
Calcium: 9.7 mg/dL (ref 8.4–10.5)
Creatinine, Ser: 0.84 mg/dL (ref 0.50–1.10)
GFR calc Af Amer: 71 mL/min — ABNORMAL LOW (ref >90)
GFR calc non Af Amer: 61 mL/min — ABNORMAL LOW (ref >90)
Glucose, Bld: 101 mg/dL — ABNORMAL HIGH (ref 70–99)
Potassium: 4.2 mEq/L (ref 3.5–5.1)

## 2012-04-26 NOTE — Progress Notes (Signed)
Palliative medicine consult received. I placed call to patients daughter Charlestine Night, [place] to schedule GOC meeting. I briefly (over the phone) discussed the patients condition and realized she has not received information about her mothers condition or work-up and that the majority of discussion has probably been with the patients sister who is at the bedside and according to [facility] does not/and may not be able to pass along that information. Malachi Bonds will be out-of-town and has a job that requires her to travel so she is unable to meet until Monday morning. GOC set for 11:30AM 9/16.  Anderson Malta, DO Palliative Medicine

## 2012-04-26 NOTE — Progress Notes (Signed)
Subjective: Pt is an 78 year old female with history of chronic constipation and chronic hyponatremia, hypertension and hypothyroidism was brought to the ER because of constipation and patient having nausea vomiting. Patient also complained of some nonspecific abdominal pain. As per the patient's daughter who had discussed with the ER physician had stated that patient has been having constipation and she had received some edema at home. Patient also was given additional edema in the ER. Since patient is coming of abdominal pain labs showed mildly elevated lipase CT abdomen and pelvis was done which at this time only shows proctitis probably from the enema received. In addition labs also show sodium of 119. Patient has been admitted for further management.  Patient had CT chest which showed possible MAI complex, ID and pulmonary has seen the patient and plan  is not to start the treatment due to poor tolerance to these medications, also they would fail to provide symptomatic relief. There is also possibility of aspiration pneumonitis, due to recurrent aspiration. She was seen by Dr Ewing Schlein in past.  Today patient denies any chest pain or shortness of breath.  Objective: Vital signs in last 24 hours: Temp:  [97.3 F (36.3 C)-98.5 F (36.9 C)] 97.3 F (36.3 C) (09/14 1323) Pulse Rate:  [60-86] 79  (09/14 1323) Resp:  [18-19] 18  (09/14 1323) BP: (123-166)/(52-75) 123/52 mmHg (09/14 1323) SpO2:  [96 %-100 %] 99 % (09/14 1323) Weight:  [51.9 kg (114 lb 6.7 oz)] 51.9 kg (114 lb 6.7 oz) (09/13 2008) Weight change: -4.346 kg (-9 lb 9.3 oz) Last BM Date: 04/23/12  Intake/Output from previous day: 09/13 0701 - 09/14 0700 In: 360 [P.O.:360] Out: 900 [Urine:900] Total I/O In: 360 [P.O.:360] Out: -    Physical Exam: Head: Normocephalic, atraumatic.  Eyes: No signs of jaundice, EOMI Nose: Mucous membranes dry.  Neck: supple,No deformities, masses, or tenderness noted. Lungs: Normal respiratory  effort. B/L Clear to auscultation, no crackles or wheezes.  Heart: Regular RR. S1 and S2 normal  Abdomen: BS normoactive. Soft, Nondistended, non-tender.  Extremities: No pretibial edema, no erythema   Lab Results: Basic Metabolic Panel:  Basename 04/26/12 0616 04/25/12 0548  NA 130* 130*  K 4.2 4.4  CL 91* 91*  CO2 32 32  GLUCOSE 101* 118*  BUN 14 16  CREATININE 0.84 0.82  CALCIUM 9.7 9.8  MG -- --  PHOS -- --   Liver Function Tests: No results found for this basename: AST:2,ALT:2,ALKPHOS:2,BILITOT:2,PROT:2,ALBUMIN:2 in the last 72 hours Recent Results (from the past 240 hour(s))  URINE CULTURE     Status: Normal   Collection Time   04/17/12 10:07 AM      Component Value Range Status Comment   Specimen Description URINE, CLEAN CATCH   Final    Special Requests NONE   Final    Culture  Setup Time 04/17/2012 10:53   Final    Colony Count >=100,000 COLONIES/ML   Final    Culture ESCHERICHIA COLI   Final    Report Status 04/19/2012 FINAL   Final    Organism ID, Bacteria ESCHERICHIA COLI   Final   AFB CULTURE, BLOOD     Status: Normal (Preliminary result)   Collection Time   04/24/12  4:00 PM      Component Value Range Status Comment   Specimen Description BLOOD LEFT ARM   Final    Special Requests 4CC   Final    Culture     Final    Value: CULTURE WILL  BE EXAMINED FOR 6 WEEKS BEFORE ISSUING A FINAL REPORT   Report Status PENDING   Incomplete   URINE CULTURE     Status: Normal (Preliminary result)   Collection Time   04/25/12  1:59 AM      Component Value Range Status Comment   Specimen Description URINE, RANDOM   Final    Special Requests NONE   Final    Culture  Setup Time 04/25/2012 02:30   Final    Colony Count 10,000 COLONIES/ML   Final    Culture     Final    Value: STAPHYLOCOCCUS SPECIES (COAGULASE NEGATIVE)     Note: RIFAMPIN AND GENTAMICIN SHOULD NOT BE USED AS SINGLE DRUGS FOR TREATMENT OF STAPH INFECTIONS.   Report Status PENDING   Incomplete      Studies/Results: Dg Esophagus  04/25/2012  *RADIOLOGY REPORT*  Clinical Data: 78 year old female with dysphagia.  Longstanding history of esophageal dysphagia.  Previous negative EGD.  ESOPHOGRAM/BARIUM SWALLOW  Technique:  Single contrast examination was performed using thin barium.  Fluoroscopy time:  3.5 minutes.  Comparison:  Chest CT 04/22/2012.  Findings:  The patient unable to stand was placed on the table in the left lateral oblique position with 820 degrees table incline. She was given thin barium by mouth which she tolerated well without apparent difficulty.  Contrast filled the cervical and upper thoracic esophagus to the mid thoracic level.  Virtual complete absence of esophageal motility is noted.  Contrast was maintained in the mid thoracic esophagus without distal flow of contrast for nearly 2 minutes. This is the same level of the esophagus seen be mildly distended with fluid on the earlier CT, and below the level the esophagus appears compressed between the posterior heart and descending thoracic aorta.  After 2 minutes contrast began to transit more distally, and the distal segment of the esophagus is nondilated.  The gastroesophageal junction is within normal limits.  Once distended, the caliber of the distal thoracic esophagus segment appears fairly normal (series 34).  Contrast filling of the stomach appeared within normal limits, the stomach is tonic.  Despite the improved distal transit, the volume of contrast was maintained in the upper thoracic esophagus for the remainder of the study.  The patient was assisted to the seated upright position. Subsequently a 20 minute delayed overhead radiograph demonstrates complete clearance of contrast from the esophagus.  IMPRESSION: Moderate to severe delay in contrast transit through the mid thoracic esophagus appears related to both absent esophageal motility and extrinsic compression on the mid thoracic esophagus from the heart and aorta. See  details above.  Eventually, (after 20- minute delay) all contrast passed into the stomach.   Original Report Authenticated By: Harley Hallmark, M.D.     Medications: Scheduled Meds:    . azithromycin  1 drop Both Eyes Daily  . ciprofloxacin  500 mg Oral BID  . feeding supplement  237 mL Oral BID BM  . feeding supplement  1 Container Oral TID BM  . levothyroxine  75 mcg Oral Q0600  . multivitamin with minerals  1 tablet Oral Daily  . pantoprazole  40 mg Oral Q1200  . pindolol  5 mg Oral BID  . polyethylene glycol  17 g Oral Daily  . polyvinyl alcohol  1 drop Both Eyes QID  . senna-docusate  1 tablet Oral BID  . cyanocobalamin  1,000 mcg Oral Daily   Continuous Infusions:  PRN Meds:.acetaminophen, acetaminophen, guaiFENesin, ondansetron (ZOFRAN) IV, ondansetron, polyvinyl alcohol  Assessment/Plan:  Principal Problem:  *Abdominal pain Active Problems:  HYPERTENSION  Hypothyroidism  Hyponatremia  Proctitis  Underweight  Pulmonary infiltrate  Hypoxemia  Hyponatremia Sodium has improved. On Tolvapton Nephrology following.  ? MAI complex No treatment per ID Will await  AFB smear results  UTI Will d/c cipro , treated for 10 days  Proctitis: Patient treated with cipro and flagyl  Dysphagia Speech therapy has evaluated the patient, and she is on dysphagia 1 diet with high risk of aspiration Esophagram shows significant esophageal and pharyngeal dysphagia. Await GI recommendation  Palliative medicine team consulted for goals of care..Goals of care on Monday Possible d/c to SNF on monday    LOS: 10 days   [facility] Pager: [phone] 04/26/2012, 3:08 PM

## 2012-04-26 NOTE — Progress Notes (Signed)
I have seen and examined this patient and agree with plan as outlined by Dr. Dorise Hiss.  Continue with fluid restriction.  Could resume lasix if Na declines but otherwise nothing further to add.  Treating underlying etiology for SIADH (MAI vs chronic asp PNA) and improving protein intake will help prevent future occurrences of hyponatremia.  Will sign off.  F/u with her PCP at SNF.  Call with questions/concerns.  Avoid HCTZ and cipro in future Atiyana Welte A,MD 04/26/2012 10:28 AM

## 2012-04-26 NOTE — Progress Notes (Signed)
[facility] Progress Note    Subjective:   No acute events overnight.  Primary team would like to have palliative care consult for goals of care. Stable and no complaints.    Objective:   BP 154/75  Pulse 82  Temp 97.6 F (36.4 C) (Oral)  Resp 18  Ht 5\' 6"  (1.676 m)  Wt 114 lb 6.7 oz (51.9 kg)  BMI 18.47 kg/m2  SpO2 98%  Intake/Output Summary (Last 24 hours) at 04/26/12 0751 Last data filed at 04/26/12 0600  Gross per 24 hour  Intake    360 ml  Output    900 ml  Net   -540 ml   Weight change: -9 lb 9.3 oz (-4.346 kg)  Physical Exam: Gen:no acute distress, appropriate and cooperative  CVS:RRR, no m/r/g, normal S1S2  Resp:CTA b/l without wheezing/rales/rhonchi  AVW:UJWJ, NT/ND, normoactive BS  Ext:no pedal or pretibial edema  Neuro: AAO x3  Imaging: Dg Esophagus  04/25/2012  *RADIOLOGY REPORT*  Clinical Data: 78 year old female with dysphagia.  Longstanding history of esophageal dysphagia.  Previous negative EGD.  ESOPHOGRAM/BARIUM SWALLOW  Technique:  Single contrast examination was performed using thin barium.  Fluoroscopy time:  3.5 minutes.  Comparison:  Chest CT 04/22/2012.  Findings:  The patient unable to stand was placed on the table in the left lateral oblique position with 820 degrees table incline. She was given thin barium by mouth which she tolerated well without apparent difficulty.  Contrast filled the cervical and upper thoracic esophagus to the mid thoracic level.  Virtual complete absence of esophageal motility is noted.  Contrast was maintained in the mid thoracic esophagus without distal flow of contrast for nearly 2 minutes. This is the same level of the esophagus seen be mildly distended with fluid on the earlier CT, and below the level the esophagus appears compressed between the posterior heart and descending thoracic aorta.  After 2 minutes contrast began to transit more distally, and the distal segment of the esophagus is nondilated.  The  gastroesophageal junction is within normal limits.  Once distended, the caliber of the distal thoracic esophagus segment appears fairly normal (series 34).  Contrast filling of the stomach appeared within normal limits, the stomach is tonic.  Despite the improved distal transit, the volume of contrast was maintained in the upper thoracic esophagus for the remainder of the study.  The patient was assisted to the seated upright position. Subsequently a 20 minute delayed overhead radiograph demonstrates complete clearance of contrast from the esophagus.  IMPRESSION: Moderate to severe delay in contrast transit through the mid thoracic esophagus appears related to both absent esophageal motility and extrinsic compression on the mid thoracic esophagus from the heart and aorta. See details above.  Eventually, (after 20- minute delay) all contrast passed into the stomach.   Original Report Authenticated By: Ulla Potash III, M.D.     Labs: BMET  Lab 04/26/12 1914 04/25/12 7829 04/24/12 0645 04/23/12 1411 04/23/12 0530 04/22/12 2108 04/22/12 1356 04/22/12 0608 04/21/12 0720 04/20/12 1120  NA 130* 130* 127* 123* 125* 119* 114* -- -- --  K 4.2 4.4 4.1 4.0 4.1 4.2 3.6 -- -- --  CL 91* 91* 89* 86* 88* 83* 78* -- -- --  CO2 32 32 31 31 32 29 30 -- -- --  GLUCOSE 101* 118* 104* 165* 109* 149* 116* -- -- --  BUN 14 16 12 12 9 11 6  -- -- --  CREATININE 0.84 0.82 0.76 0.78 0.69 0.69 0.58 -- -- --  ALB -- -- -- -- -- -- -- -- -- --  CALCIUM 9.7 9.8 9.7 9.9 9.8 9.8 9.4 -- -- --  PHOS -- -- -- -- 3.7 -- -- 2.7 3.1 2.9     Medications:       . azithromycin  1 drop Both Eyes Daily  . ciprofloxacin  500 mg Oral BID  . feeding supplement  237 mL Oral BID BM  . feeding supplement  1 Container Oral TID BM  . influenza  inactive virus vaccine  0.5 mL Intramuscular Tomorrow-1000  . levothyroxine  75 mcg Oral Q0600  . multivitamin with minerals  1 tablet Oral Daily  . pantoprazole  40 mg Oral Q1200  . pindolol  5  mg Oral BID  . polyethylene glycol  17 g Oral Daily  . polyvinyl alcohol  1 drop Both Eyes QID  . senna-docusate  1 tablet Oral BID  . cyanocobalamin  1,000 mcg Oral Daily     Assessment/ Plan:    #Hyponatremia: SIADH. Sodium stable off tolvaptan. Currently IVF is NSL. Cipro restarted by primary (day 10/10). CT chest suggestive of chronic indolent atypical infectious process & 2-76mm nodules scattered throughout lungs. ? MAI vs Chronic Aspiration; Apparently patient has had problems with aspiration in the past and is likely contributing to chronic SIADH (Seen by GI yesterday & in the past for chronic dysphagia - Dr. Ewing Schlein performed several EGDs and esophageal dilatation to 15mm) Patient declined repeat endoscopy - presume chronic aspiration, follow speech recs -Continue fluid restriction today; consider salt tabs at discharge -Follow sodium -follow AFB cx - appreciate ID input - MAI tx held for now -Continue to hold losartan 50 daily for now  -Continue 2g sodium to diet  -continue to hold any medications that may contribute to hyponatremia- suggest avoiding medications such as NSAIDs, thiazides, and SSRIs that may exacerbate hyponatremia.   #Hypothyroidism: TSH wnl, continue home dose levothyroxine per primary   #Normocytic Anemia: Hb at baseline at admission. Continue to monitor if symptomatic  -Consider repeat b12, folate and ferritin/iron studies if symptomatic   #HTN: Blood pressure adequately managed - per primary, continue pindolol   #Proctitis: Treatment complete - treated with Cipro/Flagyl per primary   #UTI: Cipro restarted per primary (has received 9d total); urine micro suggests many squams (rare bacteria, 3-6 WBC) - UA with trace leukocytes -Follow urine culture for resistance prelim is saying coag negative staph indicating CAUTI. -Anticipate tx with Cipro for 10d total  DVT PPX - SCDs   Dispo: plan to d/c to SNF for rehab after palliative care consult.   Genella Mech, MD 04/26/2012, 7:51 AM

## 2012-04-26 NOTE — Progress Notes (Signed)
[facility] Progress Note  Subjective: Patient sitting up in chair no new complaints think she may be going to the nursing home soon.. Objective: Vital signs in last 24 hours: Temp:  [97.3 F (36.3 C)-98.5 F (36.9 C)] 97.3 F (36.3 C) (09/14 0900) Pulse Rate:  [60-86] 81  (09/14 0900) Resp:  [18-19] 19  (09/14 0900) BP: (127-166)/(55-75) 160/55 mmHg (09/14 0900) SpO2:  [96 %-100 %] 100 % (09/14 0900) Weight:  [51.9 kg (114 lb 6.7 oz)] 51.9 kg (114 lb 6.7 oz) (09/13 2008) Weight change: -4.346 kg (-9 lb 9.3 oz)   PE: Unchanged  Lab Results: Results for orders placed during the hospital encounter of 04/16/12 (from the past 24 hour(s))  BASIC METABOLIC PANEL     Status: Abnormal   Collection Time   04/26/12  6:16 AM      Component Value Range   Sodium 130 (*) 135 - 145 mEq/L   Potassium 4.2  3.5 - 5.1 mEq/L   Chloride 91 (*) 96 - 112 mEq/L   CO2 32  19 - 32 mEq/L   Glucose, Bld 101 (*) 70 - 99 mg/dL   BUN 14  6 - 23 mg/dL   Creatinine, Ser 9.60  0.50 - 1.10 mg/dL   Calcium 9.7  8.4 - 45.4 mg/dL   GFR calc non Af Amer 61 (*) >90 mL/min   GFR calc Af Amer 71 (*) >90 mL/min    Studies/Results: Dg Esophagus  04/25/2012  *RADIOLOGY REPORT*  Clinical Data: 78 year old female with dysphagia.  Longstanding history of esophageal dysphagia.  Previous negative EGD.  ESOPHOGRAM/BARIUM SWALLOW  Technique:  Single contrast examination was performed using thin barium.  Fluoroscopy time:  3.5 minutes.  Comparison:  Chest CT 04/22/2012.  Findings:  The patient unable to stand was placed on the table in the left lateral oblique position with 820 degrees table incline. She was given thin barium by mouth which she tolerated well without apparent difficulty.  Contrast filled the cervical and upper thoracic esophagus to the mid thoracic level.  Virtual complete absence of esophageal motility is noted.  Contrast was maintained in the mid thoracic esophagus without distal flow of contrast for  nearly 2 minutes. This is the same level of the esophagus seen be mildly distended with fluid on the earlier CT, and below the level the esophagus appears compressed between the posterior heart and descending thoracic aorta.  After 2 minutes contrast began to transit more distally, and the distal segment of the esophagus is nondilated.  The gastroesophageal junction is within normal limits.  Once distended, the caliber of the distal thoracic esophagus segment appears fairly normal (series 34).  Contrast filling of the stomach appeared within normal limits, the stomach is tonic.  Despite the improved distal transit, the volume of contrast was maintained in the upper thoracic esophagus for the remainder of the study.  The patient was assisted to the seated upright position. Subsequently a 20 minute delayed overhead radiograph demonstrates complete clearance of contrast from the esophagus.  IMPRESSION: Moderate to severe delay in contrast transit through the mid thoracic esophagus appears related to both absent esophageal motility and extrinsic compression on the mid thoracic esophagus from the heart and aorta. See details above.  Eventually, (after 20- minute delay) all contrast passed into the stomach.   Original Report Authenticated By: Harley Hallmark, M.D.       Assessment: Dysphagia, multifactorial with both oropharyngeal and esophageal component. Barium swallow findings are confusing, especially correlated with  recent EGD in January not showing any mechanical narrowing or stricture. It appears that there is some sort of functional aperistaltic segment of the distal esophagus which does not correlate with achalasia or any other specific esophageal motility disorder. Apparently she has gotten transient relief from empiric esophageal dilatation but this is not clear to me and would like to discuss this with Dr. Ewing Schlein.  Plan: Continue dysphagia 1 diet for now. I will discuss with Dr. Ewing Schlein whether it might be  beneficial to try another dilatation or not. Esophageal manometry could be performed but I doubt it will be diagnostic. Will continue to follow at she is in the hospital.    Margaret [name] C 04/26/2012, 10:14 AM

## 2012-04-27 DIAGNOSIS — R1310 Dysphagia, unspecified: Secondary | ICD-10-CM

## 2012-04-27 LAB — BASIC METABOLIC PANEL
BUN: 12 mg/dL (ref 6–23)
Calcium: 9.5 mg/dL (ref 8.4–10.5)
Chloride: 92 mEq/L — ABNORMAL LOW (ref 96–112)
Creatinine, Ser: 0.72 mg/dL (ref 0.50–1.10)
GFR calc Af Amer: 88 mL/min — ABNORMAL LOW (ref >90)

## 2012-04-27 LAB — URINE CULTURE: Colony Count: 10000

## 2012-04-27 NOTE — Progress Notes (Signed)
Subjective: Pt is an 78 year old female with history of chronic constipation and chronic hyponatremia, hypertension and hypothyroidism was brought to the ER because of constipation and patient having nausea vomiting. Patient also complained of some nonspecific abdominal pain. As per the patient's daughter who had discussed with the ER physician had stated that patient has been having constipation and she had received some edema at home. Patient also was given additional edema in the ER. Since patient is coming of abdominal pain labs showed mildly elevated lipase CT abdomen and pelvis was done which at this time only shows proctitis probably from the enema received. In addition labs also show sodium of 119. Patient has been admitted for further management.  Patient had CT chest which showed possible MAI complex, ID and pulmonary has seen the patient and plan  is not to start the treatment due to poor tolerance to these medications, also they would fail to provide symptomatic relief. There is also possibility of aspiration pneumonitis, due to recurrent aspiration. She was seen by Dr Ewing Schlein in past.  Today patient denies any chest pain or shortness of breath. GI to decide whether esophageal dilation helpful or not.  Objective: Vital signs in last 24 hours: Temp:  [97 F (36.1 C)-98.7 F (37.1 C)] 98.2 F (36.8 C) (09/15 1403) Pulse Rate:  [77-83] 83  (09/15 1403) Resp:  [16-18] 17  (09/15 1403) BP: (125-158)/(64-84) 143/72 mmHg (09/15 1403) SpO2:  [95 %-97 %] 97 % (09/15 1403) Weight:  [52.617 kg (116 lb)] 52.617 kg (116 lb) (09/14 2036) Weight change: 0.717 kg (1 lb 9.3 oz) Last BM Date: 04/26/12  Intake/Output from previous day: 09/14 0701 - 09/15 0700 In: 840 [P.O.:840] Out: 700 [Urine:700] Total I/O In: 120 [P.O.:120] Out: 351 [Urine:350; Stool:1]   Physical Exam: Head: Normocephalic, atraumatic.  Eyes: No signs of jaundice, EOMI Nose: Mucous membranes dry.  Neck: supple,No  deformities, masses, or tenderness noted. Lungs: Normal respiratory effort. B/L Clear to auscultation, no crackles or wheezes.  Heart: Regular RR. S1 and S2 normal  Abdomen: BS normoactive. Soft, Nondistended, non-tender.  Extremities: No pretibial edema, no erythema   Lab Results: Basic Metabolic Panel:  Basename 04/27/12 0535 04/26/12 0616  NA 129* 130*  K 3.9 4.2  CL 92* 91*  CO2 31 32  GLUCOSE 99 101*  BUN 12 14  CREATININE 0.72 0.84  CALCIUM 9.5 9.7  MG -- --  PHOS -- --   Liver Function Tests: No results found for this basename: AST:2,ALT:2,ALKPHOS:2,BILITOT:2,PROT:2,ALBUMIN:2 in the last 72 hours Recent Results (from the past 240 hour(s))  AFB CULTURE, BLOOD     Status: Normal (Preliminary result)   Collection Time   04/24/12  4:00 PM      Component Value Range Status Comment   Specimen Description BLOOD LEFT ARM   Final    Special Requests 4CC   Final    Culture     Final    Value: CULTURE WILL BE EXAMINED FOR 6 WEEKS BEFORE ISSUING A FINAL REPORT   Report Status PENDING   Incomplete   URINE CULTURE     Status: Normal (Preliminary result)   Collection Time   04/25/12  1:59 AM      Component Value Range Status Comment   Specimen Description URINE, RANDOM   Final    Special Requests NONE   Final    Culture  Setup Time 04/25/2012 02:30   Final    Colony Count 10,000 COLONIES/ML   Final    Culture  Final    Value: STAPHYLOCOCCUS SPECIES (COAGULASE NEGATIVE)     Note: RIFAMPIN AND GENTAMICIN SHOULD NOT BE USED AS SINGLE DRUGS FOR TREATMENT OF STAPH INFECTIONS.   Report Status PENDING   Incomplete     Studies/Results: Dg Esophagus  04/25/2012  *RADIOLOGY REPORT*  Clinical Data: 78 year old female with dysphagia.  Longstanding history of esophageal dysphagia.  Previous negative EGD.  ESOPHOGRAM/BARIUM SWALLOW  Technique:  Single contrast examination was performed using thin barium.  Fluoroscopy time:  3.5 minutes.  Comparison:  Chest CT 04/22/2012.  Findings:  The  patient unable to stand was placed on the table in the left lateral oblique position with 820 degrees table incline. She was given thin barium by mouth which she tolerated well without apparent difficulty.  Contrast filled the cervical and upper thoracic esophagus to the mid thoracic level.  Virtual complete absence of esophageal motility is noted.  Contrast was maintained in the mid thoracic esophagus without distal flow of contrast for nearly 2 minutes. This is the same level of the esophagus seen be mildly distended with fluid on the earlier CT, and below the level the esophagus appears compressed between the posterior heart and descending thoracic aorta.  After 2 minutes contrast began to transit more distally, and the distal segment of the esophagus is nondilated.  The gastroesophageal junction is within normal limits.  Once distended, the caliber of the distal thoracic esophagus segment appears fairly normal (series 34).  Contrast filling of the stomach appeared within normal limits, the stomach is tonic.  Despite the improved distal transit, the volume of contrast was maintained in the upper thoracic esophagus for the remainder of the study.  The patient was assisted to the seated upright position. Subsequently a 20 minute delayed overhead radiograph demonstrates complete clearance of contrast from the esophagus.  IMPRESSION: Moderate to severe delay in contrast transit through the mid thoracic esophagus appears related to both absent esophageal motility and extrinsic compression on the mid thoracic esophagus from the heart and aorta. See details above.  Eventually, (after 20- minute delay) all contrast passed into the stomach.   Original Report Authenticated By: Harley Hallmark, M.D.     Medications: Scheduled Meds:    . azithromycin  1 drop Both Eyes Daily  . feeding supplement  237 mL Oral BID BM  . feeding supplement  1 Container Oral TID BM  . levothyroxine  75 mcg Oral Q0600  . multivitamin  with minerals  1 tablet Oral Daily  . pantoprazole  40 mg Oral Q1200  . pindolol  5 mg Oral BID  . polyethylene glycol  17 g Oral Daily  . polyvinyl alcohol  1 drop Both Eyes QID  . senna-docusate  1 tablet Oral BID  . cyanocobalamin  1,000 mcg Oral Daily   Continuous Infusions:  PRN Meds:.acetaminophen, acetaminophen, guaiFENesin, ondansetron (ZOFRAN) IV, ondansetron, polyvinyl alcohol  Assessment/Plan:  Principal Problem:  *Abdominal pain Active Problems:  HYPERTENSION  Hypothyroidism  Hyponatremia  Proctitis  Underweight  Pulmonary infiltrate  Hypoxemia  Dysphagia  Hyponatremia Sodium has improved. Nephrology following.  ? MAI complex No treatment per ID Will await  AFB smear results  UTI Will d/c cipro , treated for 10 days  Proctitis: Patient treated with cipro and flagyl  Dysphagia Speech therapy has evaluated the patient, and she is on dysphagia 1 diet with high risk of aspiration Esophagram shows significant esophageal and pharyngeal dysphagia. Await GI recommendation ? Esophageal dilation, as per GI.  Palliative medicine team consulted  for goals of care..Goals of care on Monday Possible d/c to SNF on monday    LOS: 11 days   [facility] Pager: [phone] 04/27/2012, 2:08 PM

## 2012-04-27 NOTE — Progress Notes (Signed)
[facility] Progress Note  Subjective: Tolerating diet as long as she eats and drinks very slowly  Objective: Vital signs in last 24 hours: Temp:  [97 F (36.1 C)-98.7 F (37.1 C)] 98.7 F (37.1 C) (09/15 0838) Pulse Rate:  [77-82] 82  (09/15 0838) Resp:  [16-18] 16  (09/15 0838) BP: (123-158)/(52-84) 150/84 mmHg (09/15 0838) SpO2:  [95 %-99 %] 96 % (09/15 0838) Weight:  [52.617 kg (116 lb)] 52.617 kg (116 lb) (09/14 2036) Weight change: 0.717 kg (1 lb 9.3 oz)   PE: Unchanged  Lab Results: Results for orders placed during the hospital encounter of 04/16/12 (from the past 24 hour(s))  BASIC METABOLIC PANEL     Status: Abnormal   Collection Time   04/27/12  5:35 AM      Component Value Range   Sodium 129 (*) 135 - 145 mEq/L   Potassium 3.9  3.5 - 5.1 mEq/L   Chloride 92 (*) 96 - 112 mEq/L   CO2 31  19 - 32 mEq/L   Glucose, Bld 99  70 - 99 mg/dL   BUN 12  6 - 23 mg/dL   Creatinine, Ser 6.04  0.50 - 1.10 mg/dL   Calcium 9.5  8.4 - 54.0 mg/dL   GFR calc non Af Amer 76 (*) >90 mL/min   GFR calc Af Amer 88 (*) >90 mL/min    Studies/Results: Dg Esophagus  04/25/2012  *RADIOLOGY REPORT*  Clinical Data: 78 year old female with dysphagia.  Longstanding history of esophageal dysphagia.  Previous negative EGD.  ESOPHOGRAM/BARIUM SWALLOW  Technique:  Single contrast examination was performed using thin barium.  Fluoroscopy time:  3.5 minutes.  Comparison:  Chest CT 04/22/2012.  Findings:  The patient unable to stand was placed on the table in the left lateral oblique position with 820 degrees table incline. She was given thin barium by mouth which she tolerated well without apparent difficulty.  Contrast filled the cervical and upper thoracic esophagus to the mid thoracic level.  Virtual complete absence of esophageal motility is noted.  Contrast was maintained in the mid thoracic esophagus without distal flow of contrast for nearly 2 minutes. This is the same level of the  esophagus seen be mildly distended with fluid on the earlier CT, and below the level the esophagus appears compressed between the posterior heart and descending thoracic aorta.  After 2 minutes contrast began to transit more distally, and the distal segment of the esophagus is nondilated.  The gastroesophageal junction is within normal limits.  Once distended, the caliber of the distal thoracic esophagus segment appears fairly normal (series 34).  Contrast filling of the stomach appeared within normal limits, the stomach is tonic.  Despite the improved distal transit, the volume of contrast was maintained in the upper thoracic esophagus for the remainder of the study.  The patient was assisted to the seated upright position. Subsequently a 20 minute delayed overhead radiograph demonstrates complete clearance of contrast from the esophagus.  IMPRESSION: Moderate to severe delay in contrast transit through the mid thoracic esophagus appears related to both absent esophageal motility and extrinsic compression on the mid thoracic esophagus from the heart and aorta. See details above.  Eventually, (after 20- minute delay) all contrast passed into the stomach.   Original Report Authenticated By: Harley Hallmark, M.D.       Assessment: Dysphagia with both oropharyngeal and esophageal component barium swallow results somewhat confusing with severe motility disorder not consistent with achalasia, may be a component of extrinsic  compression of the thoracic esophagus from the heart and aorta.  Plan: And not sure whether EGD and dilatation would be helpful. Will discuss with Dr. Ewing Schlein he was apparently done it before and in the patient's recollection this has enabled some palliation. We will see her tomorrow unless she goes home in which case we'll arrange followup at our office   Shirlette Scarber C 04/27/2012, 9:52 AM

## 2012-04-28 DIAGNOSIS — I10 Essential (primary) hypertension: Secondary | ICD-10-CM

## 2012-04-28 DIAGNOSIS — E871 Hypo-osmolality and hyponatremia: Secondary | ICD-10-CM

## 2012-04-28 LAB — BASIC METABOLIC PANEL
CO2: 31 mEq/L (ref 19–32)
Calcium: 9.5 mg/dL (ref 8.4–10.5)
Creatinine, Ser: 0.64 mg/dL (ref 0.50–1.10)
GFR calc non Af Amer: 79 mL/min — ABNORMAL LOW (ref >90)
Sodium: 128 mEq/L — ABNORMAL LOW (ref 135–145)

## 2012-04-28 MED ORDER — ENSURE COMPLETE PO LIQD: 237.0000 mL | Freq: Two times a day (BID) | ORAL | Status: DC

## 2012-04-28 MED ORDER — SENNOSIDES-DOCUSATE SODIUM 8.6-50 MG PO TABS: 1.0000 | ORAL_TABLET | Freq: Two times a day (BID) | ORAL | Status: DC

## 2012-04-28 NOTE — Progress Notes (Signed)
RT Note: RT attempted to obtain sputum sample from pt. Pt was in restroom about to being physical therapy. RT instructed pt to expectorate sputum into sample cup. Pt verbalized understanding, RT will monitor.

## 2012-04-28 NOTE — Discharge Summary (Signed)
[facility]                                                                                   Margaret [name], is a 78 y.o. female  DOB 09-30-1923  MRN [id number].  Admission date:  04/16/2012  Discharge Date:  04/28/2012  Primary MD  Michele Mcalpine, MD  Admitting Physician  Eduard Clos, MD  Admission Diagnosis  Unspecified essential hypertension [401.9] Hyponatremia [276.1] Hypothyroidism [244.9] Nausea & vomiting [787.01] Elevated lipase [790.5] Constipation [564.00] Abdominal pain [789.00] Abdominal pain  Discharge Diagnosis     Principal Problem:  *Abdominal pain Active Problems:  HYPERTENSION  Hypothyroidism  Hyponatremia  Proctitis  Underweight  Pulmonary infiltrate  Hypoxemia  Dysphagia      Past Medical History  Diagnosis Date  . Unspecified sinusitis (chronic)   . Unspecified essential hypertension   . Cerebrovascular disease, unspecified   . Unspecified venous (peripheral) insufficiency   . Pure hypercholesterolemia   . Unspecified disorder of thyroid   . Esophageal reflux   . Unspecified constipation   . Urinary tract infection, site not specified   . Osteoarthrosis, unspecified whether generalized or localized, unspecified site   . Degeneration of cervical intervertebral disc   . Closed fracture of other bone of wrist   . Anxiety state, unspecified   . Idiopathic urticaria   . Anemia, unspecified   . Other B-complex deficiencies   . Herpes zoster without mention of complication     Past Surgical History  Procedure Date  . Tonsillectomy   . Total abdominal hysterectomy w/ bilateral salpingoophorectomy   . Cervical fusion   . Cataract extraction   . Nasal sinus surgery   . Esophagogastroduodenoscopy 09/10/2011    Procedure: ESOPHAGOGASTRODUODENOSCOPY (EGD);  Surgeon: Petra Kuba, MD;  Location: [facility] ENDOSCOPY;  Service: Endoscopy;  Laterality: N/A;  c-arm needed  . Savory dilation 09/10/2011    Procedure: SAVORY  DILATION;  Surgeon: Petra Kuba, MD;  Location: [facility] ENDOSCOPY;  Service: Endoscopy;  Laterality: N/A;     Recommendations for primary care physician for things to follow:   Will need induced sputum samples for AFB x3 at the SNF, for Mycobacterium avium complex, no need for isolation. Patient will follow up with Dr Ninetta Lights in one month..     Discharge Condition: Stable   Diet recommendation: dysphagia 1 diet   Consults  Nephrology Infectious disease pulmonary Gastroenterology Palliative care  History of present illness and  Hospital Course:    78 year old female with history of chronic constipation and chronic hyponatremia, hypertension and hypothyroidism was brought to the ER because of constipation and patient having nausea vomiting. Patient also complained of some nonspecific abdominal pain. As per the patient's daughter who had discussed with the ER physician had stated that patient has been having constipation and she had received some edema at home. Patient also was given additional edema in the ER. Since patient is coming of abdominal pain labs showed mildly elevated lipase CT abdomen and pelvis was done which at this time only shows proctitis probably from the enema received. In addition labs also show sodium of 119. Patient has been admitted for further management.  When I examined the patient at this time patient only complains of rectal pain. Denies any chest pain or shortness of breath.  Proctitis Patient came with abdominal pain. CT scan abdomen/pelvis showed findings of proctitis. Patient was treated with cipro and flagyl in the hospital.at this time the symptoms have resolved.  Hyponatremia Patient has chronic hyponatremia, her sodium level fell to 119, so nephrology was consulted.  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 Later patient received Tolvaptan. At this time the sodium has improved to 128.   UTI Patient had urine culture on 9/5  which was  positive for E coli. Patient received 10 days of antibiotics in the hospital. At this time she is afebrile.  ? MAI complex  Patient had CT chest which showed possible MAI complex, so ID and pulmonary were consulted. ID did not recommend treatment at this time due to high toxicity of treatment, sputum for AFB smear and culture   was ordered, unable to obtain samples in the hospital. Samples will have to obtained at SNF and then follow up with dr Ninetta Lights as outpatient.  Dysphagia There was also a question of possible aspiration pneumonia, due to dysphagia. Patient is on modified diet with dysphagia 1 diet.GI was consulted and they have seen her in past and did dilation of esophagus. At this time , the dysphagia seems more oropharyngeal. But they would see her as outpatient with Dr Ewing Schlein  On 9/26 at 14:00 for appointment. For possible EGD and esophageal dilation.  Palliative care Palliative care meeting was done for goals of care , and the patient is DNR and does not want feeding tube. Patient will benefit from palliative care follow up as outpatient.  Underweight Continue ensure complete and ensure pudding for protein intake.  Today   Subjective:   Margaret [name] today has no headache,no chest abdominal pain/  Objective:   Blood pressure 120/59, pulse 74, temperature 98.3 F (36.8 C), temperature source Oral, resp. rate 18, height 5\' 6"  (1.676 m), weight 53.4 kg (117 lb 11.6 oz), SpO2 95.00%.   Intake/Output Summary (Last 24 hours) at 04/28/12 1243 Last data filed at 04/28/12 0906  Gross per 24 hour  Intake    300 ml  Output    475 ml  Net   -175 ml    Exam Awake Alert, Oriented *3, No new F.N deficits, Normal affect [place].AT,PERRAL Supple Neck,No JVD, No cervical lymphadenopathy appriciated.  Symmetrical Chest wall movement, Good air movement bilaterally, CTAB RRR,No Gallops,Rubs or new Murmurs, No Parasternal Heave +ve B.Sounds, Abd Soft, Non tender, No organomegaly appriciated,  No rebound -guarding or rigidity. No Cyanosis, Clubbing or edema, No new Rash or bruise  Data Review      Dg Chest 2 View  04/17/2012  *RADIOLOGY REPORT*  Clinical Data: Hypernatremia.  Evaluate for possible lung mass. Chills and shortness of breath.  CHEST - 2 VIEW  Comparison: 03/27/2010  Findings: The cardiac silhouette is normal in size and configuration.  No mediastinal or hilar masses or adenopathy. Lungs are mildly hyperexpanded. There is minor stable lung scarring in the apices.  The lungs are otherwise clear.  The bony thorax is demineralized but intact.  Calcification is noted along the course of a normal caliber mildly tortuous thoracic aorta.  There are changes from an anterior lower cervical spine fusion.   IMPRESSION: No acute cardiopulmonary disease.  No change from the prior study.   Original Report Authenticated By: Domenic Moras, M.D.  Dg Abd 1 View  04/16/2012  *RADIOLOGY REPORT*  Clinical Data: Constipation  ABDOMEN - 1 VIEW  Comparison: 10/11/2011  Findings: Supine abdomen shows no gaseous bowel dilatation to suggest obstruction.  Air is seen scattered along the length of a nondilated colon.  There is some stool in the right colon and in the rectal vault.  Convex leftward lumbar scoliosis is evident.   IMPRESSION: No evidence for bowel obstruction.  Prominent stool volume noted in the rectum.   Original Report Authenticated By: ERIC A. MANSELL, M.D.    Ct Chest Wo Contrast  04/22/2012  *RADIOLOGY REPORT*  Clinical Data: Nonproductive cough.  Congestion.  CT CHEST WITHOUT CONTRAST  Technique:  Multidetector CT imaging of the chest was performed following the standard protocol without IV contrast.  Comparison: Chest x-ray 04/17/2012.  No prior chest CTs.  Findings:  Mediastinum: Heart size is normal. There is no significant pericardial fluid, thickening or pericardial calcification. There is atherosclerosis of the thoracic aorta, the great vessels of the mediastinum and the  coronary arteries, including calcified atherosclerotic plaque in the left anterior descending, left circumflex and right coronary arteries. No pathologically enlarged mediastinal or hilar lymph nodes. Please note that accurate exclusion of hilar adenopathy is limited on noncontrast CT scans. The esophagus appears to be mildly patulous and contains some fluid or debris.  Lungs/Pleura: In the right middle lobe and the lingula there are areas of architectural distortion, mild cylindrical and varicose bronchiectasis, bronchial wall thickening with peribronchovascular ground-glass attenuation and micronodularity.  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  There are a few other tiny 2-3 mm nodules scattered throughout the lungs bilaterally which are highly nonspecific and likely part of the same process.  Mild bilateral apical pleuroparenchymal thickening is compatible with post infectious/inflammatory scarring.  No other larger more suspicious appearing pulmonary nodules or masses are otherwise identified.  No acute consolidative airspace disease.  No pleural effusions.  Upper Abdomen: Atherosclerosis.  Musculoskeletal: There are no aggressive appearing lytic or blastic lesions noted in the visualized portions of the skeleton. Orthopedic fixation hardware in the lower cervical spine is incompletely visualized.   IMPRESSION: 1.  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 2. Atherosclerosis, including three-vessel coronary artery disease. 3.  Patulous esophagus which appears to be filled with fluid and/or debris.   Original Report Authenticated By: Florencia Reasons, M.D.    Ct Abdomen Pelvis W Contrast  04/17/2012  *RADIOLOGY REPORT*  Clinical Data: Constipation for 4 days with abdominal pain.  CT ABDOMEN AND PELVIS WITH  CONTRAST  Technique:  Multidetector CT imaging of the abdomen and pelvis was performed following the standard protocol during bolus administration of intravenous contrast.  Contrast: 80mL OMNIPAQUE IOHEXOL 300 MG/ML  SOLN  Comparison: Radiographs 04/16/2012.  CT 06/09/2010.  Findings: There is mild atelectasis or scarring in the right middle lobe and lingula.  The liver, gallbladder and biliary system appear normal.  The pancreas is atrophied without focal abnormality.  The spleen and adrenal glands appear normal.  Small low-density renal lesions bilaterally are unchanged.  Most of these are cysts.  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  Bilateral extrarenal pelves are noted.  There is extensive aorto iliac and proximal visceral arterial atherosclerosis.  No enlarged abdominal pelvic lymph nodes are identified.  Urinary bladder is moderately  distended.  There is no pelvic mass status post hysterectomy.  Perirectal soft tissue stranding is similar to the prior examination.  There is no focal fluid collection or other pelvic inflammatory process.  There is multilevel spondylosis inferiorly in the lumbar spine with a degenerative anterolisthesis at L4-L5.  Diffuse bulging of the disc contributes to severe spinal and biforaminal stenosis at that level.  There is asymmetric left hip arthropathy.  No acute osseous findings are seen.  MPRESSION:  1.  Persistent or recurrent perirectal soft tissue stranding, possibly representing proctitis.  Correlate clinically. 2.  No evidence of bowel obstruction or other inflammatory process. 3.  Extensive atherosclerosis. 4.  Lower lumbar spondylosis with severe multifactorial spinal stenosis at L4-L5.   Original Report Authenticated By: Gerrianne Scale, M.D.    Dg Esophagus  04/25/2012  *RADIOLOGY REPORT*  Clinical Data: 78 year old female with dysphagia.  Longstanding history of esophageal dysphagia.  Previous negative EGD.   ESOPHOGRAM/BARIUM SWALLOW  Technique:  Single contrast examination was performed using thin barium.  Fluoroscopy time:  3.5 minutes.  Comparison:  Chest CT 04/22/2012.  Findings:  The patient unable to stand was placed on the table in the left lateral oblique position with 820 degrees table incline. She was given thin barium by mouth which she tolerated well without apparent difficulty.  Contrast filled the cervical and upper thoracic esophagus to the mid thoracic level.  Virtual complete absence of esophageal motility is noted.  Contrast was maintained in the mid thoracic esophagus without distal flow of contrast for nearly 2 minutes. This is the same level of the esophagus seen be mildly distended with fluid on the earlier CT, and below the level the esophagus appears compressed between the posterior heart and descending thoracic aorta.  After 2 minutes contrast began to transit more distally, and the distal segment of the esophagus is nondilated.  The gastroesophageal junction is within normal limits.  Once distended, the caliber of the distal thoracic esophagus segment appears fairly normal (series 34).  Contrast filling of the stomach appeared within normal limits, the stomach is tonic.  Despite the improved distal transit, the volume of contrast was maintained in the upper thoracic esophagus for the remainder of the study.  The patient was assisted to the seated upright position. Subsequently a 20 minute delayed overhead radiograph demonstrates complete clearance of contrast from the esophagus.   IMPRESSION: Moderate to severe delay in contrast transit through the mid thoracic esophagus appears related to both absent esophageal motility and extrinsic compression on the mid thoracic esophagus from the heart and aorta. See details above.  Eventually, (after 20- minute delay) all contrast passed into the stomach.   Original Report Authenticated By: Harley Hallmark, M.D.     Micro Results     Recent Results  (from the past 240 hour(s))  AFB CULTURE, BLOOD     Status: Normal (Preliminary result)   Collection Time   04/24/12  4:00 PM      Component Value Range Status Comment   Specimen Description BLOOD LEFT ARM   Final    Special Requests 4CC   Final    Culture     Final    Value: CULTURE WILL BE EXAMINED FOR 6 WEEKS BEFORE ISSUING A FINAL REPORT   Report Status PENDING   Incomplete   URINE CULTURE     Status: Normal   Collection Time   04/25/12  1:59 AM      Component Value Range Status Comment  Specimen Description URINE, RANDOM   Final    Special Requests NONE   Final    Culture  Setup Time 04/25/2012 02:30   Final    Colony Count 10,000 COLONIES/ML   Final    Culture     Final    Value: STAPHYLOCOCCUS SPECIES (COAGULASE NEGATIVE)     Note: RIFAMPIN AND GENTAMICIN SHOULD NOT BE USED AS SINGLE DRUGS FOR TREATMENT OF STAPH INFECTIONS.   Report Status 04/27/2012 FINAL   Final    Organism ID, Bacteria STAPHYLOCOCCUS SPECIES (COAGULASE NEGATIVE)   Final      CBC w Diff: Lab Results  Component Value Date   WBC 3.7* 04/17/2012   HGB 10.0* 04/17/2012   HCT 28.1* 04/17/2012   PLT 172 04/17/2012   LYMPHOPCT 39 04/17/2012   MONOPCT 9 04/17/2012   EOSPCT 0 04/17/2012   BASOPCT 0 04/17/2012    CMP: Lab Results  Component Value Date   NA 128* 04/28/2012   K 4.3 04/28/2012   CL 90* 04/28/2012   CO2 31 04/28/2012   BUN 16 04/28/2012   CREATININE 0.64 04/28/2012   PROT 7.0 04/17/2012   ALBUMIN 3.5 04/23/2012   BILITOT 0.4 04/17/2012   ALKPHOS 76 04/17/2012   AST 29 04/17/2012   ALT 14 04/17/2012  .   Discharge Instructions       Follow-up Information    Follow up with [facility], MD. In 2 weeks.   Contact information:   [address] [place] [address] [phone]            Discharge Medications     Medication List     As of 04/28/2012 12:43 PM    START taking these medications         * feeding supplement Liqd   Take 237 mLs by  mouth 2 (two) times daily between meals.      senna-docusate 8.6-50 MG per tablet   Commonly known as: Senokot-S   Take 1 tablet by mouth 2 (two) times daily.     * Notice: This list has 1 medication(s) that are the same as other medications prescribed for you. Read the directions carefully, and ask your doctor or other care provider to review them with you.    CONTINUE taking these medications         aspirin 81 MG tablet      azithromycin 1 % ophthalmic solution   Commonly known as: AZASITE      cyanocobalamin 1000 MCG tablet      * feeding supplement Pudg      guaiFENesin 600 MG 12 hr tablet   Commonly known as: MUCINEX      lansoprazole 30 MG capsule   Commonly known as: PREVACID      levothyroxine 75 MCG tablet   Commonly known as: SYNTHROID, LEVOTHROID      losartan 50 MG tablet   Commonly known as: COZAAR      * methylcellulose 1 % ophthalmic solution   Commonly known as: ARTIFICIAL TEARS      * methylcellulose 1 % ophthalmic solution   Commonly known as: ARTIFICIAL TEARS      pindolol 5 MG tablet   Commonly known as: VISKEN      polyethylene glycol powder powder   Commonly known as: GLYCOLAX/MIRALAX      vitamin D (CHOLECALCIFEROL) 400 UNITS tablet     * Notice: This list has 3 medication(s) that are  the same as other medications prescribed for you. Read the directions carefully, and ask your doctor or other care provider to review them with you.        Where to get your medications    These are the prescriptions that you need to pick up. We sent them to a specific pharmacy, so you will need to go there to get them.   [id number] - [place], [place] - [address] AT [facility] OF [address]    [address] [place] [address]    Phone: [phone]    Hours: 24-hours        feeding supplement Liqd         You may get these medications from any pharmacy.         senna-docusate 8.6-50 MG per tablet                Total Time in preparing paper work, data evaluation and todays exam - 35 minutes  Terrill Wauters S M.D on 04/28/2012 at 12:43 PM  [facility] Office  [phone]

## 2012-04-28 NOTE — Progress Notes (Signed)
Nutrition Follow-up  Intervention:   1. Encourage protein intake via diet and Ensure supplements 2. RD to continue to follow nutrition care plan   Assessment:   BSE completed on 9/12, recommending downgrade of diet to Dysphagia 1. Pt had palliative care meeting on 9/16. Per MD's note, family does not want feeding tube. Pt is consuming 25 - 50% of meals.  Sodium is currently 128.  Diet Order:  Dysphagia 1 Supplements: Ensure Complete PO BID  Meds: Scheduled Meds:    . azithromycin  1 drop Both Eyes Daily  . feeding supplement  237 mL Oral BID BM  . feeding supplement  1 Container Oral TID BM  . levothyroxine  75 mcg Oral Q0600  . multivitamin with minerals  1 tablet Oral Daily  . pantoprazole  40 mg Oral Q1200  . pindolol  5 mg Oral BID  . polyethylene glycol  17 g Oral Daily  . polyvinyl alcohol  1 drop Both Eyes QID  . senna-docusate  1 tablet Oral BID  . cyanocobalamin  1,000 mcg Oral Daily   Continuous Infusions:  PRN Meds:.acetaminophen, acetaminophen, guaiFENesin, ondansetron (ZOFRAN) IV, ondansetron, polyvinyl alcohol  Labs:  CMP     Component Value Date/Time   NA 128* 04/28/2012 0445   K 4.3 04/28/2012 0445   CL 90* 04/28/2012 0445   CO2 31 04/28/2012 0445   GLUCOSE 106* 04/28/2012 0445   BUN 16 04/28/2012 0445   CREATININE 0.64 04/28/2012 0445   CALCIUM 9.5 04/28/2012 0445   PROT 7.0 04/17/2012 0800   ALBUMIN 3.5 04/23/2012 0530   AST 29 04/17/2012 0800   ALT 14 04/17/2012 0800   ALKPHOS 76 04/17/2012 0800   BILITOT 0.4 04/17/2012 0800   GFRNONAA 79* 04/28/2012 0445   GFRAA >90 04/28/2012 0445   Sodium  Date/Time Value Range Status  04/28/2012  4:45 AM 128* 135 - 145 mEq/L Final  04/27/2012  5:35 AM 129* 135 - 145 mEq/L Final  04/26/2012  6:16 AM 130* 135 - 145 mEq/L Final     Intake/Output Summary (Last 24 hours) at 04/28/12 1535 Last data filed at 04/28/12 0906  Gross per 24 hour  Intake    180 ml  Output    325 ml  Net   -145 ml  BM 9/14  Weight Status:  117 lb  - wt up 8 lb x 5 days  Estimated needs:  1235 - 1450 kcal, 48 - 58 grams protein, 1.2 - 1.4 liters daily  Nutrition Dx:  Inadequate oral intake r/t recent GI distress AEB pt report. Ongoing.  Goal: Pt to meet >/= 90% of their estimated nutrition needs;  Met with PO intake of meals and supplements  Monitor: weight trends, lab trends, I/O's, PO intake, supplement tolerance  Jarold Motto MS, RD, LDN Pager: [phone] After-hours pager: [phone]

## 2012-04-28 NOTE — Progress Notes (Signed)
[facility] PROGRESS NOTE Subjective Patient reports that she is still having some trouble swallowing. She is unable to tell me if that is better or worse. She is unable to tell me she got better after she was dilated in the past.  Objective: Vital signs in last 24 hours: Temp:  [97.9 F (36.6 C)-98.3 F (36.8 C)] 98.3 F (36.8 C) (09/16 0957) Pulse Rate:  [74-87] 74  (09/16 0957) Resp:  [17-18] 18  (09/16 0957) BP: (120-161)/(51-76) 120/59 mmHg (09/16 0957) SpO2:  [95 %-100 %] 95 % (09/16 0957) Weight:  [53.4 kg (117 lb 11.6 oz)] 53.4 kg (117 lb 11.6 oz) (09/15 2102) Last BM Date: 04/26/12  Intake/Output from previous day: 09/15 0701 - 09/16 0700 In: 180 [P.O.:180] Out: 676 [Urine:675; Stool:1] Intake/Output this shift: Total I/O In: 120 [P.O.:120] Out: -    Lab Results: No results found for this basename: WBC:5,HGB:5,HCT:5,PLT:5 in the last 72 hours BMET  Basename 04/28/12 0445 04/27/12 0535 04/26/12 0616  NA 128* 129* 130*  K 4.3 3.9 4.2  CL 90* 92* 91*  CO2 31 31 32  CREATININE 0.64 0.72 0.84   LFT No results found for this basename: PROT:3ALBUMIN:3,AST:3,ALT:3,ALKPHOS:3,BILITOT:3,BILIDIR:3,IBILI:3 in the last 72 hours PT/INR No results found for this basename: LABPROT:3,INR:3 in the last 72 hours PANCREAS No results found for this basename: LIPASE:3 in the last 72 hours       Studies/Results: No results found.  Medications: I have reviewed the patient's current medications.  Assessment/Plan: 1. Dysphagia. It is not really clear if she got better in the past when she was dilated or not. At this point she has had a CT scan of the chest shows a patulous esophagus with a fluid and the esophagram showing contrast in the mid thoracic esophagus that eventually passed into the stomach. The GE junction was felt to be normal without stricture. This was interpreted as absent esophageal motility with extrinsic compression. I think a big issue is whether or  not this woman has achalasia or dysuria poor motility. Since her is no clear stricture that distal part of the esophagus is not clear the dilation will help and she could in fact need a PEG tube. Currently, she seems to be doing okay and that would be reasonable to discharge her and have her come back and see Dr. Ewing Schlein in the office. Would probably be reasonable to dilate her esophagus to see if that helps and if not consider PEG tube.   Rylei Masella JR,Domenik Trice L 04/28/2012, 12:34 PM

## 2012-04-28 NOTE — Progress Notes (Signed)
Chaplain visited patient as follow up from previous visits and discovered that patient was being discharged to another facility. Patient concerned about new place...fear of the unknown.  Patient expressed appreciation for the support she had received while hospitalized.  Patient's sister requested chaplain continue praying for the patient.

## 2012-04-28 NOTE — Progress Notes (Signed)
Physical Therapy Treatment Patient Details Name: Margaret [name] MRN: [id number] DOB: 1923-09-14 Today's Date: 04/28/2012 Time: 6578-4696 PT Time Calculation (min): 26 min  PT Assessment / Plan / Recommendation Comments on Treatment Session  Nervous about going to SNF for ongoing rehab, but still agreeable; Score of 36/56 indicates significant fall risk    Follow Up Recommendations  Supervision for mobility/OOB;Skilled nursing facility;Supervision/Assistance - 24 hour    Barriers to Discharge        Equipment Recommendations  Defer to next venue;Other (comment)    Recommendations for Other Services    Frequency Min 3X/week   Plan Discharge plan remains appropriate    Precautions / Restrictions Precautions Precautions: Fall   Pertinent Vitals/Pain No specific reports of pain     Mobility  Bed Mobility Bed Mobility: Not assessed Transfers Transfers: Sit to Stand;Stand to Sit Sit to Stand: 4: Min guard;From chair/3-in-1;With upper extremity assist Stand to Sit: 4: Min guard;To chair/3-in-1;With armrests Details for Transfer Assistance: Definite need for UE assist secondary to weakness Ambulation/Gait Ambulation/Gait Assistance: 5: Supervision;4: Min assist Ambulation Distance (Feet): 150 Feet Assistive device: Rolling walker;None Ambulation/Gait Assistance Details: Continued fall risk present; Cues for RW proximity and posture; Pt showing incr insight into balance deficits, asking how she can get a walker Gait velocity: decreased    Exercises     PT Diagnosis:    PT Problem List:   PT Treatment Interventions:     PT Goals Acute Rehab PT Goals Time For Goal Achievement: 05/01/12 (goals still appropriate fo rtwo weeks after initial eval) Potential to Achieve Goals: Good Pt will go Sit to Stand: with modified independence PT Goal: Sit to Stand - Progress: Progressing toward goal Pt will go Stand to Sit: with modified independence PT Goal: Stand to Sit - Progress:  Progressing toward goal Pt will Ambulate: with modified independence;51 - 150 feet;with least restrictive assistive device PT Goal: Ambulate - Progress: Progressing toward goal  Visit Information  Last PT Received On: 04/28/12 Assistance Needed: +1    Subjective Data  Subjective: Worried about dc'ing to SNF   Cognition  Overall Cognitive Status: Appears within functional limits for tasks assessed/performed Arousal/Alertness: Awake/alert Orientation Level: Appears intact for tasks assessed Behavior During Session: [facility] for tasks performed    Balance  Standardized Balance Assessment Standardized Balance Assessment: Berg Balance Test Berg Balance Test Sit to Stand: Able to stand  independently using hands Standing Unsupported: Able to stand 2 minutes with supervision Sitting with Back Unsupported but Feet Supported on Floor or Stool: Able to sit safely and securely 2 minutes Stand to Sit: Controls descent by using hands Transfers: Able to transfer safely, definite need of hands Standing Unsupported with Eyes Closed: Able to stand 10 seconds with supervision Standing Ubsupported with Feet Together: Able to place feet together independently and stand 1 minute safely From Standing, Reach Forward with Outstretched Arm: Can reach forward >12 cm safely (5") From Standing Position, Pick up Object from Floor: Unable to pick up and needs supervision From Standing Position, Turn to Look Behind Over each Shoulder: Turn sideways only but maintains balance Turn 360 Degrees: Able to turn 360 degrees safely but slowly Standing Unsupported, Alternately Place Feet on Step/Stool: Able to complete >2 steps/needs minimal assist Standing Unsupported, One Foot in Front: Able to plae foot ahead of the other independently and hold 30 seconds Standing on One Leg: Tries to lift leg/unable to hold 3 seconds but remains standing independently Total Score: 36   End of Session  PT - End of Session Equipment  Utilized During Treatment: Gait belt Activity Tolerance: Patient tolerated treatment well Patient left: in chair;with call bell/phone within reach;with family/visitor present Nurse Communication: Mobility status   GP     Olen Pel [place], [place] [phone]' 04/28/2012, 4:09 PM

## 2012-04-28 NOTE — Consult Note (Signed)
Patient Margaret [name]      DOB: 09/30/1923      BJY:[id number]     Consult Note from the Palliative Medicine Team at [facility]    Consult Requested by: Dr Sharl Ma     PCP: Michele Mcalpine, MD Reason for Consultation:Clarification of GOC and options     Phone Number:[phone]  Assessment of patients Current state:78 yr old female with overall decline over the past six months, long h/o chronic constipation, dysphagia, hyponatremia.  Will need work-up for bronchiectasis and lung nodules on dc, found on chest CT (family aware).  Plan is to dc to SNF for rehabilitation and follow up with infectious disease and GI on dc   This NP Lorinda Creed reviewed medical records, received report from team, assessed the patient and then meet at the patient's bedside along with her daughter Charlestine Night # [phone] and husband Launa Flight and son Oriyah Lamphear # [phone] to discuss diagnosis prognosis, GOC, EOL wishes disposition and options.   A detailed discussion was had today regarding advanced directives.  Concepts specific to code status, artifical feeding and hydration, continued IV antibiotics and rehospitalization was had.  The difference between a aggressive medical intervention path  and a palliative comfort care path for this patient at this time was had.  Values and goals of care important to patient and family were attempted to be elicited.  Concept of Hospice and Palliative Care were discussed  Natural trajectory and expectations at EOL were discussed.  Questions and concerns addressed. Family encouraged to call with questions or concerns.  PMT will continue to support holistically.    Goals of Care: 1.  Code Status: DNR/DNI   2. Scope of Treatment: Continue with present medical treatment plan, patient is hopeful for improvement. Patient and family are aware of follow up appointments with Dr Hatcher(infectious disease) and Dr Ewing Schlein (GI)  4. Disposition: Disposition to SNF  ([facility]) for rehabilitation, open to Palliative Care Services to follow on Discharge.   3. Symptom Management: -Dysphagia -Discussed importance of Pured diet, conscience/slow  eating awareness, family encouraged to request speech therapy at facitly  -Constipation -medications as written, importance of diet  And fluids discussed    4. Psychosocial:  Emotional support offered to both patient and family at bedside.  It was very difficult for patient to discuss advanced directives  5. Spiritual:refused chaplain at this time        Patient Documents Completed or Given: Document Given Completed  Advanced Directives Pkt    MOST    DNR  yes  Gone from [facility]    Hard Choices      Brief HPI:78 yr old female with overall decline over the past six months, long h/o chronic constipation, dysphagia, hyponatremia.  Will need work-up for bronchiectasis and lung nodules on dc, found on chest CT (family aware).  Plan is to dc to SNF for rehabilitation and follow up with infectious disease and GI on dc    UVO:[id number] swallowing, weakness and fatigue, weight loss    PMH:  Past Medical History  Diagnosis Date  . Unspecified sinusitis (chronic)   . Unspecified essential hypertension   . Cerebrovascular disease, unspecified   . Unspecified venous (peripheral) insufficiency   . Pure hypercholesterolemia   . Unspecified disorder of thyroid   . Esophageal reflux   . Unspecified constipation   . Urinary tract infection, site not specified   . Osteoarthrosis, unspecified whether generalized or localized, unspecified site   . Degeneration  of cervical intervertebral disc   . Closed fracture of other bone of wrist   . Anxiety state, unspecified   . Idiopathic urticaria   . Anemia, unspecified   . Other B-complex deficiencies   . Herpes zoster without mention of complication      PSH: Past Surgical History  Procedure Date  . Tonsillectomy   . Total abdominal hysterectomy w/  bilateral salpingoophorectomy   . Cervical fusion   . Cataract extraction   . Nasal sinus surgery   . Esophagogastroduodenoscopy 09/10/2011    Procedure: ESOPHAGOGASTRODUODENOSCOPY (EGD);  Surgeon: Petra Kuba, MD;  Location: [facility] ENDOSCOPY;  Service: Endoscopy;  Laterality: N/A;  c-arm needed  . Savory dilation 09/10/2011    Procedure: SAVORY DILATION;  Surgeon: Petra Kuba, MD;  Location: [facility] ENDOSCOPY;  Service: Endoscopy;  Laterality: N/A;   I have reviewed the FH and SH and  If appropriate update it with new information. Allergies  Allergen Reactions  . Aspirin Hives    High Doses   Scheduled Meds:   . azithromycin  1 drop Both Eyes Daily  . feeding supplement  237 mL Oral BID BM  . feeding supplement  1 Container Oral TID BM  . levothyroxine  75 mcg Oral Q0600  . multivitamin with minerals  1 tablet Oral Daily  . pantoprazole  40 mg Oral Q1200  . pindolol  5 mg Oral BID  . polyethylene glycol  17 g Oral Daily  . polyvinyl alcohol  1 drop Both Eyes QID  . senna-docusate  1 tablet Oral BID  . cyanocobalamin  1,000 mcg Oral Daily   Continuous Infusions:  PRN Meds:.acetaminophen, acetaminophen, guaiFENesin, ondansetron (ZOFRAN) IV, ondansetron, polyvinyl alcohol    BP 120/59  Pulse 74  Temp 98.3 F (36.8 C) (Oral)  Resp 18  Ht 5\' 6"  (1.676 m)  Wt 53.4 kg (117 lb 11.6 oz)  BMI 19.00 kg/m2  SpO2 95%   PPS: 40%   Intake/Output Summary (Last 24 hours) at 04/28/12 1240 Last data filed at 04/28/12 0906  Gross per 24 hour  Intake    300 ml  Output    475 ml  Net   -175 ml   LBM: 04-28-12                  Stool Softner: Miralax/  Senna-s,  Physical Exam:  General: frail elderly white female, HEENT:  Moist mucous membranes, HOH Chest:   CTA CVS: RRR Abdomen:soft NT +BS Ext: without edema Neuro:alet and oriented X3  Labs: CBC    Component Value Date/Time   WBC 3.7* 04/17/2012 0800   RBC 3.34* 04/17/2012 0800   HGB 10.0* 04/17/2012 0800   HCT 28.1* 04/17/2012 0800     PLT 172 04/17/2012 0800   MCV 84.1 04/17/2012 0800   MCH 29.9 04/17/2012 0800   MCHC 35.6 04/17/2012 0800   RDW 14.3 04/17/2012 0800   LYMPHSABS 1.4 04/17/2012 0800   MONOABS 0.3 04/17/2012 0800   EOSABS 0.0 04/17/2012 0800   BASOSABS 0.0 04/17/2012 0800    BMET    Component Value Date/Time   NA 128* 04/28/2012 0445   K 4.3 04/28/2012 0445   CL 90* 04/28/2012 0445   CO2 31 04/28/2012 0445   GLUCOSE 106* 04/28/2012 0445   BUN 16 04/28/2012 0445   CREATININE 0.64 04/28/2012 0445   CALCIUM 9.5 04/28/2012 0445   GFRNONAA 79* 04/28/2012 0445   GFRAA >90 04/28/2012 0445    CMP  Component Value Date/Time   NA 128* 04/28/2012 0445   K 4.3 04/28/2012 0445   CL 90* 04/28/2012 0445   CO2 31 04/28/2012 0445   GLUCOSE 106* 04/28/2012 0445   BUN 16 04/28/2012 0445   CREATININE 0.64 04/28/2012 0445   CALCIUM 9.5 04/28/2012 0445   PROT 7.0 04/17/2012 0800   ALBUMIN 3.5 04/23/2012 0530   AST 29 04/17/2012 0800   ALT 14 04/17/2012 0800   ALKPHOS 76 04/17/2012 0800   BILITOT 0.4 04/17/2012 0800   GFRNONAA 79* 04/28/2012 0445   GFRAA >90 04/28/2012 0445       Time In Time Out Total Time Spent with Patient Total Overall Time  1100 1300 110 min 120 min    Greater than 50%  of this time was spent counseling and coordinating care related to the above assessment and plan.  Dr Sharl Ma aware of above  Lorinda Creed NP

## 2012-05-01 NOTE — Consult Note (Signed)
I have reviewed and discussed the care of this patient in detail with the nurse practitioner including pertinent patient records, physical exam findings and data. I agree with details of this encounter.  

## 2012-05-13 ENCOUNTER — Ambulatory Visit (HOSPITAL_COMMUNITY): Payer: Medicare Other

## 2012-05-13 ENCOUNTER — Encounter (HOSPITAL_COMMUNITY): Payer: Self-pay

## 2012-05-13 ENCOUNTER — Encounter (HOSPITAL_COMMUNITY)
Admission: RE | Disposition: A | Discharge status: Home / Self Care | Payer: Self-pay | Source: Ambulatory Visit | Attending: Gastroenterology

## 2012-05-13 ENCOUNTER — Ambulatory Visit (HOSPITAL_COMMUNITY)
Admission: RE | Admit: 2012-05-13 | Discharge: 2012-05-13 | Disposition: A | Discharge status: Home / Self Care | Payer: Medicare Other | Source: Ambulatory Visit | Attending: Gastroenterology | Admitting: Gastroenterology

## 2012-05-13 DIAGNOSIS — R1310 Dysphagia, unspecified: Secondary | ICD-10-CM | POA: Insufficient documentation

## 2012-05-13 HISTORY — PX: SAVORY DILATION: SHX5439

## 2012-05-13 HISTORY — PX: ESOPHAGOGASTRODUODENOSCOPY: SHX5428

## 2012-05-13 SURGERY — EGD (ESOPHAGOGASTRODUODENOSCOPY)
Anesthesia: Moderate Sedation

## 2012-05-13 MED ORDER — SODIUM CHLORIDE 0.9 % IV SOLN: INTRAVENOUS | Status: DC

## 2012-05-13 MED ORDER — MIDAZOLAM HCL 10 MG/2ML IJ SOLN
INTRAMUSCULAR | Status: DC | PRN
  Administered 2012-05-13 (×2): 2 mg via INTRAVENOUS

## 2012-05-13 MED ORDER — FENTANYL CITRATE 0.05 MG/ML IJ SOLN
INTRAMUSCULAR | Status: DC | PRN
  Administered 2012-05-13 (×2): 25 ug via INTRAVENOUS

## 2012-05-13 MED ORDER — MIDAZOLAM HCL 10 MG/2ML IJ SOLN
INTRAMUSCULAR | Status: AC
  Filled 2012-05-13: qty 4

## 2012-05-13 MED ORDER — BUTAMBEN-TETRACAINE-BENZOCAINE 2-2-14 % EX AERO
INHALATION_SPRAY | CUTANEOUS | Status: DC | PRN
  Administered 2012-05-13: 1 via TOPICAL

## 2012-05-13 MED ORDER — DIPHENHYDRAMINE HCL 50 MG/ML IJ SOLN
INTRAMUSCULAR | Status: AC
  Filled 2012-05-13: qty 1

## 2012-05-13 MED ORDER — FENTANYL CITRATE 0.05 MG/ML IJ SOLN
INTRAMUSCULAR | Status: AC
  Filled 2012-05-13: qty 4

## 2012-05-13 NOTE — Progress Notes (Signed)
Margaret [name] 8:07 AM  Subjective: Patient has no new problems from when I saw her recently in the office and has no additional questions  Objective: Vital signs stable afebrile no acute distress exam please see pre-assessment evaluation  Assessment: Multiple medical problems including dysphasia helped with dilations in the past  Plan: Okay to proceed with EGD with Savary dilatation  [employer] E

## 2012-05-13 NOTE — Op Note (Signed)
[facility] [address] [place], [address]   ENDOSCOPY PROCEDURE REPORT  PATIENT: Margaret [name], Margaret [name]  MR#: #[id number] BIRTHDATE: November 10, 1923 , 88  yrs. old GENDER: Female  ENDOSCOPIST: Vida Rigger, MD REFERRED XB:JYNWG Kriste Basque, M.D.  Adrian Prince, M.D.  PROCEDURE DATE:  05/13/2012 PROCEDURE:   Savary dilation of esophagus and EGD, diagnostic ASA CLASS:   Class III INDICATIONS:Dysphasia abnormal barium swallow helped with dilation in the past.  MEDICATIONS: Fentanyl 50 mcg IV and Versed 4 mg IV  TOPICAL ANESTHETIC:used  DESCRIPTION OF PROCEDURE:   After the risks benefits and alternatives of the procedure were thoroughly explained, informed consent was obtained.  The Pentax Gastroscope [id number]  endoscope was introduced through the mouth and advanced to the second portion of the duodenum , limited by Without limitations.   The instrument was slowly withdrawn as the mucosa was fully examined.the findings are recorded below and once the endoscopy was completed and the esophagus was evaluated on advancement and complete withdrawal x2 we advanced to the antrum and under fluoroscopy guidance the Savary wire was advanced  and the customary J. loop of the wire was confirmed under both fluoroscopic guidance and endoscopic guidanceand the scope was removed making sure the wire stayed in the proper location in the stomach and then the 15 mm Savary dilator was advanced over the wire in the customary fashion and confirmed in the proper location in the stomach and the wire was withdrawn back into the dilator and both were removed in tandem . There was no resistance on the dilator and no heme on the dilator and the patient tolerated the procedure well there was no obvious immediate complication      FINDINGS:1 small hiatal hernia with probable short segment Barrett's not biopsied due to age and other medical problem and need for dilation today 2. No esophageal  motility seen 3. Multiple small gastric polyps 4. Otherwise within normal limits EGD therapy: Savary dilatation under fluoroscopy using 15 mm dilator only COMPLICATIONS:none  ENDOSCOPIC IMPRESSION: above   RECOMMENDATIONS:see how dilation works call me when necessary give MiraLAX every day for her chronic constipation and followup in one month   REPEAT EXAM:as needed   _______________________________ Vida Rigger, MD    CC:  PATIENT NAME:  Belissa, Kooy MR#: #[id number]

## 2012-05-14 ENCOUNTER — Encounter (HOSPITAL_COMMUNITY): Payer: Self-pay

## 2012-05-14 ENCOUNTER — Encounter (HOSPITAL_COMMUNITY): Payer: Self-pay | Admitting: Gastroenterology

## 2012-05-22 ENCOUNTER — Encounter: Payer: Self-pay | Admitting: Infectious Diseases

## 2012-05-22 ENCOUNTER — Ambulatory Visit (INDEPENDENT_AMBULATORY_CARE_PROVIDER_SITE_OTHER): Payer: Medicare Other | Admitting: Infectious Diseases

## 2012-05-22 VITALS — BP 156/60 | HR 72 | Temp 97.8°F | Ht 67.0 in | Wt 114.0 lb

## 2012-05-22 DIAGNOSIS — R918 Other nonspecific abnormal finding of lung field: Secondary | ICD-10-CM

## 2012-05-22 NOTE — Assessment & Plan Note (Signed)
She is doing well. I am not sure about her continued fatigue but her respiratory status is unchanged. Her AFB Bcx, AFB sputum are negative so far. Would rec to not treat her for the suggestion of MAI on her CT scan. Agree that these findings are more likely related to chronic aspiration. This is explained to pt and family. Her family asks what to look for if her status should change- increased sputum, fever, chills, increased cough, dyspnea. Will see her back prn.

## 2012-05-22 NOTE — Progress Notes (Signed)
  Subjective:    Patient ID: Margaret [name], female    DOB: [birth year], 78 y.o.   MRN: [id number]  HPI 78 yo F with hx of HTN, GERD, chronic hypoNatremia admitted with n/v, worsened chronic constipation, and hypoNatremia (119) on 9-5. She was given enemas, and underwent CT scanning (revealing proctitis). She was started on cipro and flagyl. Her cipro was continued after she was found to have E coli UTI (stopped 9-10 due to concerns about hyponatremia). 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 This showed nodules, bronchiectasis, bronchial wall thickening suggestive of atypical infection (MAI).  It also showed a patulous esophagus with fluid/debris present. Pt was also eval by CCM who felt that her radiograph findings were more likely to be related to aspiration that infection. She underwent swallowing eval showing that contrast entered stomach after 20 minutes.  She had BCx for AFB 9-12 (-) and sputum Cx for AFB (ngtd).  Today c/o having a bad cold, R arm and leg aching. Coughing a lot, stuffy nose, headaches. Minimal sputum production. Stays cold, but does not know if she has had fever. Exercise tolerance is at her baseline per pt. Per family, she is fatigued more easily than previous.  Had esophageal dilatation last week. Eating pureed food.   Review of Systems  Constitutional: Negative for fever and chills.  HENT: Positive for trouble swallowing.   Respiratory: Positive for cough. Negative for shortness of breath.   Cardiovascular: Negative for leg swelling.  Gastrointestinal: Positive for constipation. Negative for diarrhea.  Genitourinary: Negative for dyspareunia.  Hematological: Negative for adenopathy.       Objective:   Physical Exam  Constitutional: She appears well-developed and well-nourished.  HENT:  Mouth/Throat: No oropharyngeal exudate.  Eyes: EOM are normal. Pupils are equal, round, and reactive to light.  Neck: Neck supple.  Cardiovascular:  Normal rate, regular rhythm and normal heart sounds.   Pulmonary/Chest: Effort normal and breath sounds normal.  Abdominal: Soft. Bowel sounds are normal. She exhibits no distension. There is no tenderness.  Musculoskeletal: She exhibits no edema.  Lymphadenopathy:    She has no cervical adenopathy.    She has no axillary adenopathy.       Right: No supraclavicular adenopathy present.          Assessment & Plan:

## 2012-06-06 LAB — AFB CULTURE, BLOOD

## 2012-06-10 LAB — AFB CULTURE WITH SMEAR (NOT AT ARMC)

## 2012-07-03 ENCOUNTER — Telehealth: Payer: Self-pay | Admitting: Pulmonary Disease

## 2012-07-03 NOTE — Telephone Encounter (Signed)
Called and spoke with pts daughter and she stated that the pt had appt with SN on 11/27 but the had to cancel due to schedules and pt is currently still at [facility] for rehab.  Daughter stated that the pt states that she is ok to drive but the pt is weak and the family feels that she should not be driving.  i explained to the pts daughter that she will need to contact the [facility] and get the proper papers to be filled out and i have scheduled pt to come in on 12/09 at 4 to discuss and fill these papers out.  Daughter is aware.  Nothing further is needed.

## 2012-07-09 ENCOUNTER — Ambulatory Visit: Payer: Medicare Other | Admitting: Pulmonary Disease

## 2012-07-18 ENCOUNTER — Encounter: Payer: Self-pay | Admitting: *Deleted

## 2012-07-21 ENCOUNTER — Encounter: Payer: Self-pay | Admitting: Pulmonary Disease

## 2012-07-21 ENCOUNTER — Telehealth: Payer: Self-pay | Admitting: *Deleted

## 2012-07-21 ENCOUNTER — Ambulatory Visit (INDEPENDENT_AMBULATORY_CARE_PROVIDER_SITE_OTHER): Payer: Medicare Other | Admitting: Pulmonary Disease

## 2012-07-21 VITALS — BP 152/82 | HR 73 | Temp 97.4°F | Ht 66.0 in | Wt 116.0 lb

## 2012-07-21 DIAGNOSIS — J4489 Other specified chronic obstructive pulmonary disease: Secondary | ICD-10-CM

## 2012-07-21 DIAGNOSIS — J449 Chronic obstructive pulmonary disease, unspecified: Secondary | ICD-10-CM

## 2012-07-21 DIAGNOSIS — L501 Idiopathic urticaria: Secondary | ICD-10-CM

## 2012-07-21 DIAGNOSIS — D649 Anemia, unspecified: Secondary | ICD-10-CM

## 2012-07-21 DIAGNOSIS — I10 Essential (primary) hypertension: Secondary | ICD-10-CM

## 2012-07-21 DIAGNOSIS — M5030 Other cervical disc degeneration, unspecified cervical region: Secondary | ICD-10-CM

## 2012-07-21 DIAGNOSIS — I679 Cerebrovascular disease, unspecified: Secondary | ICD-10-CM

## 2012-07-21 DIAGNOSIS — F411 Generalized anxiety disorder: Secondary | ICD-10-CM

## 2012-07-21 DIAGNOSIS — M1990 Unspecified osteoarthritis, unspecified site: Secondary | ICD-10-CM

## 2012-07-21 DIAGNOSIS — I872 Venous insufficiency (chronic) (peripheral): Secondary | ICD-10-CM

## 2012-07-21 DIAGNOSIS — K5900 Constipation, unspecified: Secondary | ICD-10-CM

## 2012-07-21 DIAGNOSIS — J329 Chronic sinusitis, unspecified: Secondary | ICD-10-CM

## 2012-07-21 DIAGNOSIS — R1310 Dysphagia, unspecified: Secondary | ICD-10-CM

## 2012-07-21 DIAGNOSIS — E039 Hypothyroidism, unspecified: Secondary | ICD-10-CM

## 2012-07-21 DIAGNOSIS — N390 Urinary tract infection, site not specified: Secondary | ICD-10-CM

## 2012-07-21 DIAGNOSIS — K219 Gastro-esophageal reflux disease without esophagitis: Secondary | ICD-10-CM

## 2012-07-21 NOTE — Progress Notes (Signed)
Subjective:    Patient ID: Margaret [name], female    DOB: [birth year], 78 y.o.   MRN: [id number]  HPI 78 y/o WF here for a follow up visit... She has Chr Sinusitis;  HBP;  ASPVD;  Chol;  GERD/ Constip w/ hx impaction;  UTIs;  DJD/ DDD;  Hx anxiety;  Hx anemia...   ~  July 17, 2010:  she was hosp 10/27 to 06/10/10 by [facility] w/ constipation & fecal impaction> her GI is DrMagod;  CT Abd showed norm GB, sm bilat renal cysts, prev hyst, rectal wall thickening c/w proctitis;  Labs OK x mild anemia w/ Hg~10, borderline hypothy;  she was disimpacted & re-started on her bowel regimen of MIRALAX & SENAKOT-S...  she is back to baseline but weak, wt down to 110# & asked to incr the supplements to Bid... chr sinus prob w/o change;  BP controlled & stable on meds;  denies cerebral ischemic symptoms;  see prob list below>>>  ~  November 06, 2010:  67mo ROV w/ review of mult chr problems & FASTING blood work today>  Review shows elev TSH= 9.85 & we will need to start her on Synthroid 100mcg/d...  ~  March 08, 2011:  67mo ROV & she feels as though she is "a little bit better" but still c/o weak & thinks one of her meds is keeping her up at night (doesn't know which one);  She stopped several meds including Mucinex, Losartan, Crestor, KCl, & Fe;  We decided to recheck labs> FLP looks good today ?is this on or off the Cres20 (Pharm says last filled #30 on 11/11/10)==> keep same & bring all med bottles to f/u visit; Chems= norm x Na=128; Hg=11.1, Fe= 87, B12= 418, TSH=0.27  ~  July 09, 2011:  67mo ROV & she brought all of her med bottles to the office today to review>>    Chr sinusitis>  Hx allergies & chr sinus problems w/ polyps & septal perf prev eval by DrShoemaker;  Currently only uses Mucinex & Saline prn...    HBP & pre-syncope>  She saw DrPRoss 10/12 after a spell at [facility] w/ near syncope; she adjusted her meds by stopping Cozaar50 & Amlodipine2.5, & starting PINDOLOL5Bid; Review of meds shows that she never stopped  Losartan or Norvasc, she did fill Visken5 but only taking one/d; BP= 132/70 w/ transient postural drop ==> Rec to stop Amlodipine, continue Cozaar50, incr Pindolol5Bid...     Cerebrovasc dis>  On ASA 81mg /d;  sm vessel dis on prev MRI & faint bilat CBruits w/ hx neg CDopplers in the past...    Chol>  Prev on Simva40 but stopped by the pt 8/11 due to "weak"; we tried to get her on Crestor & so did DrRoss but pt stopped this too (?made her weak)...    GERD>  On Prevacid 30mg /d, known HH & followed by [facility] for GI w/ dilatation 11/11 & swallowing better...    Constip>  Hx severe constip w/ hosp for impaction 2003;  On Miralax daily & doing OK...    DJD>  Can't take NSAIDs well, uses Tylenol & prn Advil;  C/o some left ankle pain on & off...    Anxiety>  Not on meds...    Anemia & B12 defic>  Takes Fe daily & ?B12 shots monthly;  Labs today showed Hg=12.0, MCV=90...  ~  September 05, 2011:  34mo ROV & post hosp> Adm 08/29/11 w/ adb pain, fecal impaction, worsening dysphagia, UTI, HBP &  chronic hyponatremia==> improved after fluids, laxatives, antibiotics, etc; MBS showed dysphagia & she is on a liq diet==> she has f/u w/ DrMagod soon for EGD & dilatation...  See Prob List below>>  ~  October 09, 2011:  28mo ROV> she reports doing fine, having good BMs, & no new complaints or concerns today...   BP controlled on Cozaar & Visken, 142/64 today, denies CP/ palpit/ ch in SOB/ edema...  She had EGD w/ Savory dilatation from her gastroenterologist [facility] 09/10/11> essentially neg EGD (sm HH, few tiny gastric polyps) & dilated w/o difficulty; states swallowing is improved, OK now...  Denies constip now on good regimen w/ Miralax daily & Dulcolax tabs/ suppos as needed...  Other problems as noted>>  ~  December 10, 2011:  36mo ROV & f/u from ER 2/13> seen in ER w/ abd pain, loose stool & N/V;  Exam was neg w/o abd tenderness or distention but rectal showed large hard stool burden in rectum;  Labs showed Hg=10.3, Na=125,  UA was +UTI;  XRay showed mod stool burden in colon;  She was given dose of Rocephin, suppository, asked to incr Miralax to Bid...  I note that for all her trouble w/ chronic constipation she just doesn't get it & won't take regular dosing of Miralax, Senakot-S, dulcolax etc; she could not afford & would not fill the Amitiza Rx or Linzess...  See prob list below>>    She reports "everything is OK now" & when pressed she admitted to not taking regular meds for her chr constipation> advised to restart MIRALAX Daily Qam & SENAKOT-S Qhs... She tells me she fell at church several weeks ago, no serious injury & didn't go to the ER but this launched [place] into a discussion about her status>> Lives alone in her own home, does some housework, takes her own meds ?OK, daughter visits 2-3x per week, son lives in [place], son in law does her yard work, sister drives her to appts, she says she is safe & does ok at home, she has no intention of looking into AL etc...  ~  March 10, 2012:  31mo ROV & Margaret [name] has a number of chronic complaints> feels weak, not able to walk far, food doesn't taste good, some minor bruising noted, etc; states "I'm doing pretty good on my own at home" & she refuses to make other plans for the future...     As noted she has had a signif prob w/ constipation & XRays have consistently shown large stool burden; she seems incapable of self monitoring BMs/ constip/ etc; she has only been taking 1 tablesp of Miralax daily & once again asked to take 1 capful of Miralax daily along w/ 1-2 Senakot-S at bedtime!!!    We reviewed prob list, meds, xrays and labs> see below for updates >>  ~  July 21, 2012:  13mo ROV & post hosp check> presents from [facility] w/o med list today for post [facility] check, states she is trying to get into AL or independ living facility...    Hosp 9/4 - 04/28/12 by [facility] w/ "I was sick" per pt- chr constip, chr hyponatremia, HBP- she was c/o n/v & abd pain- assoc w/ recurrent constip,  her gastroenterologist is DrMagod & he has her on Miralax alone for her slow transit (although we have had her on more meds & she still gets constipated); also had evid proctitis on CT Abd & treated w/ Cipro/ Flagyl in hosp; also had patulous esoph w/ fluid &  debris present- DrMagod did EGD/ dilatation 10/13 showing smHH w/ short segm Barrett's, poor esoph motility, mult sm gastric polyps, s/p savory dilatation;  She also had prob asp pneumonia & saw DrHatcher for ID- they considered MAI but signs more c/w chr asp due to her esoph prob;  They also had the palliative care team involved- she is a DNR, declines feeding tubes, they were to f/u as out pt...     As noted she remains in [facility] at present, attended by DrSouth> we have called for her current med list >> We reviewed the following medical problems during today's office visit >>     Chronic sinus problems> followed by DrShoemaker- hx nasal septal perf, on Saline, Mucinex, Fluids...    Chronic Lung Dis> Bronchiectasis, recurrent asp pneumonia, ?MAI?> prev Mucinex was stopped in the NH, no pos resp cultures in EPIC...    HBP> on Cozaar50, Pindolol5Bid; BP= 150/80 & she denies CP, palpit, ch in SOB, edema, etc...    Cerebrovasc Dis> prev on ASA81; she denies cerebral ischemic symptoms...    Ven Insuffic> she knows to avoid sodium, elev legs, wear support hose when needed...    CHOL> she declines any chol meds; last FLP 7/12 was ?on Cres20- TChol 163, TG 91, HDL 72, LDL 73    Hypothy> on Synth75, last TSH 9/13 = 0.51    GI- GERD, esoph dysmotility w/ chr asp, ?Barretts, severe chr constipation> Prevacid30/d, pills crushed in applesauce, Miralax 17gm/d, Senakot-S Bid; her GI issues are managed by DrMagod et al...    Hx recurrent UTIs> Urine cult at [facility] grew Citrobacter- sens to Cipro, treated...    DJD, DDD in Cspine> on VitD 50K weekly, Tylenol prn...    Anxiety> not currently on anxiolytic Rx...    Hx Urticaria> the etiology was never  delineated, but hasn't been recurrent either...    Anemia, B12 Defic> on VitB121000 mcg/d; last B12 level was 1/13 = 635... We reviewed prob list, meds, xrays and labs> see below for updates >>    Problem List:      SINUSITIS, CHRONIC (ICD-473.9) - Long Hx chronic sinus problems - Eval & Rx by DrShoemaker w/ hx epistaxis, nasal septal erosion w/ prev "button";  and nasal polyps... she uses Saline, Mucinex, etc... she notes mild chr cough from the drainage- we reviewed chronic nasal regimen & rec MUCINEX DM 2Bid...  HYPERTENSION (ICD-401.9) >> currently controlled on LOSARTAN 50mg /d & PINDOLOL 5mg Bid... ~  Baseline EKG w/ NSR, WNL... ~  2D ECHO 2001 was normal... ~  CXR 11/07 w/ Biapical pl thickening, NAD... CXR 9/09 showed COPD w/ incr markings, NAD... f/u CXR 5/11 showed COPD, NAD...  ~  11/10: c/o weak in AM- decision to decr Amlodipine to 5mg /d & BP has been stable on this. ~  8/11: Hosp by [facility] w/ "presyncope" & meds were adjusted but she restarted prev Rx on discharge> we decided to stop Diovan/Hct in favor of LOSARTAN 50mg /d (?if she ever even filled it- reminded to bring all med bottles to every visit). ~  7/12: BP today= 120/70, no postural changes, ?on Amlodip5 & Losartan50... she denies HA, visual symptoms, CP, palpit, change in SOB, edema, etc... ~  10/12: Near syncope at [facility]> saw DrRoss w/ attempted meds changes (asked to stop Amlodip/Losartan & start Pindolol5Bid) but she decided to take all 3... ~  11/12: BP today= 132/70 w/ transient postural drop; asked to stop Amlodip, continue LOSARTAN50, take VISKEN5Bid... ~  1/13: BP= 118/72 on same meds &  feeling better since disch... ~  2/13: BP= 142/64 on Losar50 & Pindolol5Bid, continue same; 1 view CXR 2/13 showed COPD, biapical scarring, NAD... ~  4/13: BP= 128/72 w/o postural changes; she denies CP, palpit, ch in DOE, edema... ~  7/13:  BP= 118/72 & she remains asymptomatic...  CEREBROVASCULAR DISEASE (ICD-437.9) - on ASA 81mg /d...  Prev eval for syncope (related to postural BP changes) w/ MRI showing small vessel dz;  she has bilat CBruits w/ Carotid Dopplers in 2001 showing mild plaque, no signif ICA stenoses... ~  she denies cerebral ischemic symptoms on the ASA 81mg /d...  VENOUS INSUFFICIENCY (ICD-459.81) - on low sodium diet + support hose as needed.  HYPERCHOLESTEROLEMIA (ICD-272.0) - prev on Simva40 but she thinks this made her weak & she stopped it;  Tried Crestor 20mg /d but ?if filling this med regularly or not, asked to bring all bottles to each visit... ~  FLP 12/08 showed TChol 204, TG 93, HDL 67, LDL 106... On Simva40/d. ~  FLP 9/09 showed TChol 183, TG 53, HDL 68, LDL 105... Contin same + diet. ~  FLP 8/10 showed TChol 224, TG 78, HDL 67, LDL 137... rec> take med regularly & work on diet. ~  FLP 5/11 showed TChol 187, TG 80, HDL 67, LDL 104 ~  8/11: she thinks the Simva40 makes her weak & she wants to put it on HOLD. ~  FLP off meds 3/12 showed TChol 293, TG 90, HDL 81, LDL 191... rec to restart Cres20. ~  FLP 7/12 ?on Cres20 showed TChol 163, TG 91, HDL 72, LDL 73 ~  She has since stopped the Crestor on her own (?making her weak). ~  1/13 & 4/13: Pt on diet alone & doesn't want Chol meds; not fasting today for FLP...  UNSPECIFIED DISORDER OF THYROID/ HYPOTHYROIDISM - she has a sl elevated TSH which we were following & finally started SYNTHROID 44mcg/d 3/12... ~  labs 9/09 showed TSH= 7.31 ~  labs 1/10 showed TSH= 5.40 ~  labs 8/10 showed TSH= 5.08... she does c/o being "cold" ~  labs 5/11 showed TSH= 6.11... she notes that her energy is "pretty fair" ~  labs 8/11 showed TSH= 0.73... wt= 116# & stable over the last 18mo. ~  Labs 3/12 showed TSH= 9.85... Rec> start SYNTHROID 2mcg/d. ~  Labs 7/12 on Levo75 showed TSH= 0.27... Keep same for now. ~  Labs 1/13 on Levo75 showed TSH= 4.68  GERD (ICD-530.81) - PPI changed to generic PREVACID 30mg /d... Known HH & GERD w/ EGD from [facility] 8/01 & 2/06 showing HH,  GERD, erosions, & gastric polyp...  ~  EGD 12/08 w/ savory dilatation... she is encouraged to f/u w/ DrMagod if symptoms occur... ~  f/u EGD w/ dilatation 11/11 by [facility] for dysphagia symptoms showed tort esoph, sm HH, tiny gastric polyps, Savory dil done. ~  Yet another EGD & dilatation 1/13 by DrMagod; smHH, few tiny gastric polyps, Savory dilatation done...  CONSTIPATION (ICD-564.00) - Hx severe constipation w/ fecal impaction req hosp 2/03 & intermittently over the yrs... ~  colonoscopy 2/06 DrMagod w/ hemorrhoids & sm polyps... Encouraged to use MIRALAX daily... ~  [facility] 10/11 w/ constip & fecal impaction> disimpacted & started back on MIRALAX & SENAKOT-S... ~  [facility] 1/13 w/ similar problem & we reviewed the importance of taking the Miralax & Senakot every day & monitoring her output w/ contingency meds (Mag citrate & Dulcolax) if needed... ~  2/13 OV: she reports improved w/ regular Miralax & prn  Dulcolax; asked to restart the SENAKOT-S Qhs as well...  UTI (ICD-599.0) - Hx chronic UTI's Rx by Urology Joneen Boers w/ Macrodantin in the past... ~  8/11: Hosp by [facility] w/ UTI- sens EColi treated w/ Rocephin/ then Septra. ~  2/13: UTI w/ final C&S growing Staph & Enterococcus both sens to Levaquin & Tcn> Rx Levaquin 500mg /d + Align.  DEGENERATIVE JOINT DISEASE (ICD-715.90) - she uses Tylenol/ Advil as needed... ~  5/09:  she fell and broke her right wrist- s/p ORIF 6/09 by DrWeingold  DEGENERATIVE DISC DISEASE, CERVICAL SPINE (ICD-722.4) - CSpine surg by DrJenkins 1998... ~  12/13:  She is back on Vit D 50,000 u weekly...  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  ANEMIA (ICD-285.9) >> she takes one IRON tab daily & B12 1057mcg/d... VITAMIN B12 DEFICIENCY (ICD-266.2) ~  labs 6/08 showed Hg= 10.5 ~  labs 12/08 showed Hg= 12.3, Fe= 92 ~  labs 9/09 showed Hg= 11.8, MCV=89 ~  labs 1/10 showed Hg= 12.1,  MCV= 89, Fe= 83, B12= 186... started on B12 shots monthly. ~  labs 8/10 showed Hg= 12.2, MCV= 92, Fe= 87, B12= 470... continue Rx. ~  labs 5/11 showed Hg= 11.2, MCV= 88 ~  labs 8/11 in hosp showed Hg= 10-11 range; ?switched to Oral B12- 1013mcg/d... ~  labs 10/11 in hosp showed Hg= 10-11 range... asked to start Femiron one tab daily. ~  Labs 3/12 showed Hg= 12.0, MCV= 90 ~  Labs 7/12 showed Hg= 11.1, MCV= 89, Fe= 87 (22%sat) ~  Labs 1/13 showed Hg=10.3, MCV=90, Fe=46 (13%sat), B12=635, SPE=neg (no monoclonal spike).  Hx SHINGLES - right T9-10 distrib 6/10 & treated w/ Famvir, Medrol, DCN100...   Past Surgical History  Procedure Date  . Tonsillectomy   . Total abdominal hysterectomy w/ bilateral salpingoophorectomy   . Cervical fusion   . Cataract extraction   . Nasal sinus surgery   . Esophagogastroduodenoscopy 09/10/2011    Procedure: ESOPHAGOGASTRODUODENOSCOPY (EGD);  Surgeon: Petra Kuba, MD;  Location: [facility] ENDOSCOPY;  Service: Endoscopy;  Laterality: N/A;  c-arm needed  . Savory dilation 09/10/2011    Procedure: SAVORY DILATION;  Surgeon: Petra Kuba, MD;  Location: [facility] ENDOSCOPY;  Service: Endoscopy;  Laterality: N/A;  . Esophagogastroduodenoscopy 05/13/2012    Procedure: ESOPHAGOGASTRODUODENOSCOPY (EGD);  Surgeon: Petra Kuba, MD;  Location: [facility] ENDOSCOPY;  Service: Endoscopy;  Laterality: N/A;  with carm  . Savory dilation 05/13/2012    Procedure: SAVORY DILATION;  Surgeon: Petra Kuba, MD;  Location: [facility] ENDOSCOPY;  Service: Endoscopy;  Laterality: N/A;    Outpatient Encounter Prescriptions as of 07/21/2012  Medication Sig Dispense Refill  . aspirin 81 MG tablet Take 81 mg by mouth daily.        Marland Kitchen azithromycin (AZASITE) 1 % ophthalmic solution Place 1 drop into both eyes daily.      . cyanocobalamin 1000 MCG tablet Take 1,000 mcg by mouth daily.       . feeding supplement (ENSURE COMPLETE) LIQD Take 237 mLs by mouth 2 (two) times daily between meals.  10 Bottle  2  . feeding  supplement (ENSURE) PUDG Take 1 Container by mouth daily.       Marland Kitchen guaiFENesin (MUCINEX) 600 MG 12 hr tablet Take 1,200 mg by mouth 2 (two) times daily as needed. For cough.      . lansoprazole (PREVACID) 30 MG capsule Take 30 mg by mouth  daily.       . levothyroxine (SYNTHROID, LEVOTHROID) 75 MCG tablet Take 75 mcg by mouth daily.      Marland Kitchen losartan (COZAAR) 50 MG tablet Take 50 mg by mouth daily.      . methylcellulose (ARTIFICIAL TEARS) 1 % ophthalmic solution Place 1 drop into both eyes as needed.      . pindolol (VISKEN) 5 MG tablet Take 5 mg by mouth 2 (two) times daily.      . polyethylene glycol powder (GLYCOLAX/MIRALAX) powder Take 17 g by mouth daily.      Marland Kitchen senna-docusate (SENOKOT-S) 8.6-50 MG per tablet Take 1 tablet by mouth 2 (two) times daily.  30 tablet  1  . vitamin D, CHOLECALCIFEROL, 400 UNITS tablet Take 400 Units by mouth daily.        Allergies  Allergen Reactions  . Aspirin Hives    High Doses     Current Medications, Allergies, Past Medical History, Past Surgical History, Family History, and Social History were reviewed in [employer] record.    Review of Systems        See HPI - all other systems neg except as noted...  The patient complains of weight loss, decreased hearing, dyspnea on exertion, headaches, severe indigestion/heartburn, muscle weakness, and difficulty walking.  The patient denies anorexia, fever, weight gain, vision loss, hoarseness, chest pain, syncope, peripheral edema, prolonged cough, hemoptysis, abdominal pain, melena, hematochezia, hematuria, incontinence, suspicious skin lesions, transient blindness, depression, unusual weight change, abnormal bleeding, enlarged lymph nodes, and angioedema.     Objective:   Physical Exam     WD, WN, 78 y/o WF in NAD... she is chr ill appearing... wt 112# GENERAL:  Alert & oriented; pleasant & cooperative... HEENT:  [employer]/AT, EOM-wnl, PERRLA, EACs- sl wax, TMs-wnl, NOSE- nasal septal  perf, THROAT- sl red +drainage... NECK:  Supple w/ decrROM; no JVD; normal carotid impulses w/ 1+bruits; no thyromegaly or nodules palpated; no lymphadenopathy. CHEST:  Clear to P & A; without wheezes or rales, but scat rhonchi heard... HEART:  Regular Rhythm;  gr1/6 SEM without rubs or gallops detected... ABDOMEN:  Soft & nontender; normal bowel sounds; no organomegaly or masses palpated... EXT: without deformities, mild arthritic changes; no varicose veins/ +venous insuffic/ no edema. NEURO:  CN's intact;  no focal neuro deficits, she is weak diffusely... DERM: scarring in the right T9-10 distrib of her prev shingles eruption...  RADIOLOGY DATA:  Reviewed in the EPIC EMR & discussed w/ the patient...  LABORATORY DATA:  Reviewed in the EPIC EMR & discussed w/ the patient...   Assessment & Plan:    Hx Chronic Sinusitis>  Long hx chr sinus problems and perforated nasal septum; rec to use Mucinex 1-2 Bid + Fluids and nasal saline mist...  Chr Lung dis- Bronchiectasis, Asp pneumonias, ?MAI?- not proven> most findings the result of her severe esoph problems...  HBP>  BP meds adjusted freq but she has been non-compliant; pt asked to bring all med bottles to every visit; we decided upon LOSARTAN 50mg /d & PINDOLOL 5mg Bid==> BP is stable on this Rx.  Cerebrovasc Dis>  She denies any cerebral ischemic symptoms; no postural BP changes; continue ASA daily...  Venous Insuffic>  Reminded to elim sodium, elevate legs, wear support hose...  CHOL>  She stopped the low dose Crestor also rec by [place]; for now she is on diet alone & she doesn't want cholesterol meds.  HYPOTHYROID>  On Synthroid 67mcg/d & labs are better- keep same for  now...  GI> GERD, Dysphagia, Constip, recurrent impactions, etc>  rec to take PPI/ Prevacid regularly, and continue Miralax/ Senakot-S etc; reminded to take laxative regimen every day!!!  She had repeat EGD by [facility] 1/13 & 10/13 w/ dilatations...  DJD, CSpine Disc dis,  etc>  Stable on OTC Tylenol & occas Advil...  ANEMIA & B12 Defic>  She remains on Fe one tab daily & B12 orally as well.Marland KitchenMarland Kitchen

## 2012-07-21 NOTE — Telephone Encounter (Signed)
Pt left office before I could give paperwork and inform of what was needed.  1.  She needed to take back form to [facility] signed by Dr. Kriste Basque.  I called [facility] spoke with Iris.  I have faxed this form to [phone]. 2.  Pt needed labs done.  I called lab, pt has not shown up for this.    I called pt's daughter's cell # as she was with pt during OV and [email or web address].  Will route to Leigh to f/u on tomorrow.

## 2012-07-21 NOTE — Patient Instructions (Addendum)
Today we updated your med list in our EPIC system...    Continue your current medications the same...  We are requesting a copy of your medication list from [facility]...  Today we did your follow up blood work & we will call you w/ the results when avail...  Let's plan a follow up visit in 6 weeks time.Marland KitchenMarland Kitchen

## 2012-07-24 ENCOUNTER — Telehealth: Payer: Self-pay | Admitting: Pulmonary Disease

## 2012-07-24 ENCOUNTER — Other Ambulatory Visit: Payer: Self-pay | Admitting: Internal Medicine

## 2012-07-24 NOTE — Telephone Encounter (Signed)
Called and spoke with michelle and she did take the verbal order for the speech therapy and the Child psychotherapist.  She stated that her BP was a little elevated today but she is not concerned about this at this time due to michelle is new to the pt and the pt was recently d/c from the facility.  They will cont to monitor her BP but pt did not have any complaints today.  Marcelino Duster also took a verbal order for the pt to be on the iron tablet daily since she was not sent home with this order.  Pt will follow up with SN on jan 22 at 12.  Nothing further is needed.

## 2012-07-24 NOTE — Telephone Encounter (Signed)
Called and spoke with Margaret [name] will not be able to come back until her appt with SN--1/22.  She stated that the pt is at home now but is getting [facility] help in the home for PT.  Will make SN aware that pt will not be able to come back in before this appt.

## 2012-07-25 ENCOUNTER — Telehealth: Payer: Self-pay | Admitting: Pulmonary Disease

## 2012-07-25 NOTE — Telephone Encounter (Signed)
I spoke with Margaret [name] and he stated they are sending over an order for PT, OT and nursing. Pt is home from rehab center now. This is only FYI to look out for the form. Will forward to SN so he is aware of this

## 2012-08-01 ENCOUNTER — Telehealth: Payer: Self-pay | Admitting: Pulmonary Disease

## 2012-08-01 MED ORDER — ALPRAZOLAM 0.25 MG PO TABS: ORAL_TABLET | ORAL | Status: DC

## 2012-08-01 NOTE — Telephone Encounter (Signed)
Xanax 0.25 mg # 30 one every 6 hours if needed no refills

## 2012-08-01 NOTE — Telephone Encounter (Signed)
Called and spoke with pts daughter and she is aware of MW recs and that this medication has been called to her pharmacy. Nothing further is needed.

## 2012-08-01 NOTE — Telephone Encounter (Addendum)
SPoke with daughter and pt is being placed in an assisted living facility.per Daughter pt is not sleeping well Having a lot of anxiety.requesting something to help her to take the edge off and make this a easier move On her.  Allergies  Allergen Reactions  . Aspirin Hives    High Doses   Dr Kriste Basque Please advise Thank you

## 2012-08-09 ENCOUNTER — Encounter (HOSPITAL_COMMUNITY): Payer: Self-pay | Admitting: *Deleted

## 2012-08-09 ENCOUNTER — Inpatient Hospital Stay (HOSPITAL_COMMUNITY)
Admission: EM | Admit: 2012-08-09 | Discharge: 2012-08-20 | DRG: 064 | Disposition: A | Discharge status: Skilled Nursing Facility | Payer: Medicare Other | Attending: Internal Medicine | Admitting: Internal Medicine

## 2012-08-09 DIAGNOSIS — Z7982 Long term (current) use of aspirin: Secondary | ICD-10-CM

## 2012-08-09 DIAGNOSIS — I6350 Cerebral infarction due to unspecified occlusion or stenosis of unspecified cerebral artery: Principal | ICD-10-CM | POA: Diagnosis present

## 2012-08-09 DIAGNOSIS — I5032 Chronic diastolic (congestive) heart failure: Secondary | ICD-10-CM

## 2012-08-09 DIAGNOSIS — K5641 Fecal impaction: Secondary | ICD-10-CM

## 2012-08-09 DIAGNOSIS — I5033 Acute on chronic diastolic (congestive) heart failure: Secondary | ICD-10-CM | POA: Diagnosis present

## 2012-08-09 DIAGNOSIS — R109 Unspecified abdominal pain: Secondary | ICD-10-CM

## 2012-08-09 DIAGNOSIS — F0390 Unspecified dementia without behavioral disturbance: Secondary | ICD-10-CM | POA: Diagnosis present

## 2012-08-09 DIAGNOSIS — N179 Acute kidney failure, unspecified: Secondary | ICD-10-CM | POA: Diagnosis present

## 2012-08-09 DIAGNOSIS — F70 Mild intellectual disabilities: Secondary | ICD-10-CM | POA: Diagnosis present

## 2012-08-09 DIAGNOSIS — R410 Disorientation, unspecified: Secondary | ICD-10-CM

## 2012-08-09 DIAGNOSIS — E871 Hypo-osmolality and hyponatremia: Secondary | ICD-10-CM | POA: Diagnosis present

## 2012-08-09 DIAGNOSIS — I509 Heart failure, unspecified: Secondary | ICD-10-CM | POA: Diagnosis present

## 2012-08-09 DIAGNOSIS — F411 Generalized anxiety disorder: Secondary | ICD-10-CM | POA: Diagnosis present

## 2012-08-09 DIAGNOSIS — N390 Urinary tract infection, site not specified: Secondary | ICD-10-CM | POA: Diagnosis present

## 2012-08-09 DIAGNOSIS — E039 Hypothyroidism, unspecified: Secondary | ICD-10-CM | POA: Diagnosis present

## 2012-08-09 DIAGNOSIS — G9341 Metabolic encephalopathy: Secondary | ICD-10-CM | POA: Diagnosis present

## 2012-08-09 DIAGNOSIS — I679 Cerebrovascular disease, unspecified: Secondary | ICD-10-CM

## 2012-08-09 DIAGNOSIS — B961 Klebsiella pneumoniae [K. pneumoniae] as the cause of diseases classified elsewhere: Secondary | ICD-10-CM | POA: Diagnosis present

## 2012-08-09 DIAGNOSIS — E7800 Pure hypercholesterolemia, unspecified: Secondary | ICD-10-CM

## 2012-08-09 DIAGNOSIS — I10 Essential (primary) hypertension: Secondary | ICD-10-CM | POA: Diagnosis present

## 2012-08-09 DIAGNOSIS — Z66 Do not resuscitate: Secondary | ICD-10-CM | POA: Diagnosis present

## 2012-08-09 DIAGNOSIS — K5900 Constipation, unspecified: Secondary | ICD-10-CM | POA: Diagnosis present

## 2012-08-09 DIAGNOSIS — I639 Cerebral infarction, unspecified: Secondary | ICD-10-CM

## 2012-08-09 DIAGNOSIS — R1310 Dysphagia, unspecified: Secondary | ICD-10-CM | POA: Diagnosis present

## 2012-08-09 DIAGNOSIS — R4182 Altered mental status, unspecified: Secondary | ICD-10-CM

## 2012-08-09 LAB — COMPREHENSIVE METABOLIC PANEL
ALT: 15 U/L (ref 0–35)
AST: 31 U/L (ref 0–37)
CO2: 25 mEq/L (ref 19–32)
Calcium: 8.9 mg/dL (ref 8.4–10.5)
GFR calc non Af Amer: 84 mL/min — ABNORMAL LOW (ref >90)
Sodium: 121 mEq/L — ABNORMAL LOW (ref 135–145)

## 2012-08-09 LAB — CBC WITH DIFFERENTIAL/PLATELET
Basophils Absolute: 0 10*3/uL (ref 0.0–0.1)
Eosinophils Relative: 0 % (ref 0–5)
Lymphocytes Relative: 36 % (ref 12–46)
Neutro Abs: 3.1 10*3/uL (ref 1.7–7.7)
Neutrophils Relative %: 53 % (ref 43–77)
Platelets: 218 10*3/uL (ref 150–400)
RDW: 14 % (ref 11.5–15.5)
WBC: 5.8 10*3/uL (ref 4.0–10.5)

## 2012-08-09 LAB — OCCULT BLOOD, POC DEVICE: Fecal Occult Bld: NEGATIVE

## 2012-08-09 MED ORDER — SODIUM CHLORIDE 0.9 % IV SOLN
INTRAVENOUS | Status: DC
  Administered 2012-08-10: 02:00:00 via INTRAVENOUS

## 2012-08-09 NOTE — ED Notes (Signed)
Pt c/o constipation w/ leakage of watery diarrhea since Thursday a.m. Pt has been using OTC laxatives and suppositories w/o results. Pt experiencing considerable abdominal pain. Pt denies n/v at this time.

## 2012-08-09 NOTE — ED Notes (Signed)
[id number]> Expected date:<BR> Expected time:<BR> Means of arrival:<BR> Comments:<BR>

## 2012-08-09 NOTE — ED Provider Notes (Addendum)
History     CSN: [id number]  Arrival date & time 08/09/12  2129   First MD Initiated Contact with Patient 08/09/12 2318      Chief Complaint  Patient presents with  . Fecal Impaction    leaking diarrhea    (Consider location/radiation/quality/duration/timing/severity/associated sxs/prior treatment) HPI Level V caveat: Altered mental status. This is an 78 year old female with a history of episodic fecal impactions. She is supposed to be on daily MiraLAX but only takes it as needed. She has been constipated for the past 3 days, passing liquid stool around a presumed impaction. She has subsequently taken MiraLAX and mineral oil by mouth as well as suppositories and enemas rectally without relief. She has some mild abdominal pain and moderate rectal discomfort.  Her family states that her mental status has become altered in the last hour. She is minimally conversant and has a gaze to the right. Even when addressed from her left hand side her eyes look to the right. She continues to move all limbs symmetrically.   Past Medical History  Diagnosis Date  . Unspecified sinusitis (chronic)   . Unspecified essential hypertension   . Cerebrovascular disease, unspecified   . Unspecified venous (peripheral) insufficiency   . Pure hypercholesterolemia   . Unspecified disorder of thyroid   . Esophageal reflux   . Unspecified constipation   . Urinary tract infection, site not specified   . Osteoarthrosis, unspecified whether generalized or localized, unspecified site   . Degeneration of cervical intervertebral disc   . Closed fracture of other bone of wrist   . Anxiety state, unspecified   . Idiopathic urticaria   . Anemia, unspecified   . Other B-complex deficiencies   . Herpes zoster without mention of complication     Past Surgical History  Procedure Date  . Tonsillectomy   . Total abdominal hysterectomy w/ bilateral salpingoophorectomy   . Cervical fusion   . Cataract extraction    . Nasal sinus surgery   . Esophagogastroduodenoscopy 09/10/2011    Procedure: ESOPHAGOGASTRODUODENOSCOPY (EGD);  Surgeon: Petra Kuba, MD;  Location: [facility] ENDOSCOPY;  Service: Endoscopy;  Laterality: N/A;  c-arm needed  . Savory dilation 09/10/2011    Procedure: SAVORY DILATION;  Surgeon: Petra Kuba, MD;  Location: [facility] ENDOSCOPY;  Service: Endoscopy;  Laterality: N/A;  . Esophagogastroduodenoscopy 05/13/2012    Procedure: ESOPHAGOGASTRODUODENOSCOPY (EGD);  Surgeon: Petra Kuba, MD;  Location: [facility] ENDOSCOPY;  Service: Endoscopy;  Laterality: N/A;  with carm  . Savory dilation 05/13/2012    Procedure: SAVORY DILATION;  Surgeon: Petra Kuba, MD;  Location: [facility] ENDOSCOPY;  Service: Endoscopy;  Laterality: N/A;    Family History  Problem Relation Age of Onset  . Diabetes Father   . Heart failure Mother   . Diabetes Sister     History  Substance Use Topics  . Smoking status: Never Smoker   . Smokeless tobacco: Never Used  . Alcohol Use: No    OB History    Grav Para Term Preterm Abortions TAB SAB Ect Mult Living                  Review of Systems  Unable to perform ROS   Allergies  Aspirin  Home Medications   Current Outpatient Rx  Name  Route  Sig  Dispense  Refill  . ALPRAZOLAM 0.25 MG PO TABS   Oral   Take 0.125 mg by mouth every 6 (six) hours as needed. For anxiety         .  ASPIRIN 81 MG PO TABS   Oral   Take 81 mg by mouth every morning.          . AZITHROMYCIN 1 % OP SOLN   Both Eyes   Place 1 drop into both eyes every evening.          Marland Kitchen CYANOCOBALAMIN 1000 MCG PO TABS   Oral   Take 1,000 mcg by mouth every morning.          Marland Kitchen ENSURE COMPLETE PO LIQD   Oral   Take 237 mLs by mouth daily.         Marland Kitchen LANSOPRAZOLE 30 MG PO CPDR   Oral   Take 30 mg by mouth every morning.          Marland Kitchen LEVOTHYROXINE SODIUM 75 MCG PO TABS   Oral   Take 75 mcg by mouth every morning.          . METHYLCELLULOSE 1 % OP SOLN   Both Eyes   Place 1 drop into both  eyes as needed.         Marland Kitchen MINERAL OIL PO OIL   Oral   Take 30 mLs by mouth daily as needed. For constipation         . PINDOLOL 5 MG PO TABS   Oral   Take 5 mg by mouth 2 (two) times daily.         Marland Kitchen POLYETHYLENE GLYCOL 3350 PO POWD   Oral   Take 17 g by mouth daily.         Bernadette Hoit SODIUM 8.6-50 MG PO TABS   Oral   Take 1 tablet by mouth 2 (two) times daily.   30 tablet   1   . VITAMIN D 400 UNITS PO TABS   Oral   Take 400 Units by mouth every 7 (seven) days. On Thursdays           BP 163/93  Pulse 103  Temp 97.9 F (36.6 C) (Oral)  Resp 18  SpO2 94%  Physical Exam General: Well-developed, well-nourished female in no acute distress; appearance consistent with age of record HENT: normocephalic, atraumatic Eyes: pupils equal round and reactive to light; rightward gaze Neck: supple Heart: regular rate and rhythm Lungs: clear to auscultation bilaterally Abdomen: soft; nondistended; mildly tender; no masses or hepatosplenomegaly; bowel sounds present Rectal: Normal sphincter tone; soft cyst impaction in vault not amenable to manual disimpaction Extremities: Arthritic change; pulses normal; no Neurologic: Awake, alert; motor function intact in all extremities and symmetric; no facial droop Skin: Warm and dry Psychiatric: Flat affect; poverty of speech    ED Course  Procedures (including critical care time)  CRITICAL CARE Performed by: Zoee Heeney L   Total critical care time: 30 minutes  Critical care time was exclusive of separately billable procedures and treating other patients.  Critical care was necessary to treat or prevent imminent or life-threatening deterioration.  Critical care was time spent personally by me on the following activities: development of treatment plan with patient and/or surrogate as well as nursing, discussions with consultants, evaluation of patient's response to treatment, examination of patient, obtaining  history from patient or surrogate, ordering and performing treatments and interventions, ordering and review of laboratory studies, ordering and review of radiographic studies, pulse oximetry and re-evaluation of patient's condition.    MDM   Nursing notes and vitals signs, including pulse oximetry, reviewed.  Summary of this visit's results, reviewed by myself:  Labs:  Results  for orders placed during the hospital encounter of 08/09/12 (from the past 24 hour(s))  CBC WITH DIFFERENTIAL     Status: Abnormal   Collection Time   08/09/12 10:45 PM      Component Value Range   WBC 5.8  4.0 - 10.5 K/uL   RBC 3.44 (*) 3.87 - 5.11 MIL/uL   Hemoglobin 10.5 (*) 12.0 - 15.0 g/dL   HCT 84.1 (*) 32.4 - 40.1 %   MCV 83.7  78.0 - 100.0 fL   MCH 30.5  26.0 - 34.0 pg   MCHC 36.5 (*) 30.0 - 36.0 g/dL   RDW 02.7  25.3 - 66.4 %   Platelets 218  150 - 400 K/uL   Neutrophils Relative 53  43 - 77 %   Neutro Abs 3.1  1.7 - 7.7 K/uL   Lymphocytes Relative 36  12 - 46 %   Lymphs Abs 2.1  0.7 - 4.0 K/uL   Monocytes Relative 10  3 - 12 %   Monocytes Absolute 0.6  0.1 - 1.0 K/uL   Eosinophils Relative 0  0 - 5 %   Eosinophils Absolute 0.0  0.0 - 0.7 K/uL   Basophils Relative 0  0 - 1 %   Basophils Absolute 0.0  0.0 - 0.1 K/uL  COMPREHENSIVE METABOLIC PANEL     Status: Abnormal   Collection Time   08/09/12 10:45 PM      Component Value Range   Sodium 121 (*) 135 - 145 mEq/L   Potassium 4.0  3.5 - 5.1 mEq/L   Chloride 86 (*) 96 - 112 mEq/L   CO2 25  19 - 32 mEq/L   Glucose, Bld 114 (*) 70 - 99 mg/dL   BUN 12  6 - 23 mg/dL   Creatinine, Ser 4.03  0.50 - 1.10 mg/dL   Calcium 8.9  8.4 - 47.4 mg/dL   Total Protein 7.2  6.0 - 8.3 g/dL   Albumin 3.9  3.5 - 5.2 g/dL   AST 31  0 - 37 U/L   ALT 15  0 - 35 U/L   Alkaline Phosphatase 75  39 - 117 U/L   Total Bilirubin 0.6  0.3 - 1.2 mg/dL   GFR calc non Af Amer 84 (*) >90 mL/min   GFR calc Af Amer >90  >90 mL/min  PROTIME-INR     Status: Normal    Collection Time   08/09/12 10:45 PM      Component Value Range   Prothrombin Time 12.9  11.6 - 15.2 seconds   INR 0.98  0.00 - 1.49  OCCULT BLOOD, POC DEVICE     Status: Normal   Collection Time   08/09/12 11:33 PM      Component Value Range   Fecal Occult Bld NEGATIVE  NEGATIVE  URINALYSIS, ROUTINE W REFLEX MICROSCOPIC     Status: Abnormal   Collection Time   08/10/12 12:06 AM      Component Value Range   Color, Urine YELLOW  YELLOW   APPearance CLOUDY (*) CLEAR   Specific Gravity, Urine 1.007  1.005 - 1.030   pH 7.5  5.0 - 8.0   Glucose, UA NEGATIVE  NEGATIVE mg/dL   Hgb urine dipstick SMALL (*) NEGATIVE   Bilirubin Urine NEGATIVE  NEGATIVE   Ketones, ur NEGATIVE  NEGATIVE mg/dL   Protein, ur NEGATIVE  NEGATIVE mg/dL   Urobilinogen, UA 0.2  0.0 - 1.0 mg/dL   Nitrite NEGATIVE  NEGATIVE   Leukocytes, UA  LARGE (*) NEGATIVE  URINE MICROSCOPIC-ADD ON     Status: Abnormal   Collection Time   08/10/12 12:06 AM      Component Value Range   WBC, UA TOO NUMEROUS TO COUNT  <3 WBC/hpf   RBC / HPF 0-2  <3 RBC/hpf   Bacteria, UA MANY (*) RARE    Imaging Studies: Ct Head Wo Contrast  08/10/2012  *RADIOLOGY REPORT*  Clinical Data: Altered level of consciousness.  CT HEAD WITHOUT CONTRAST  Technique:  Contiguous axial images were obtained from the base of the skull through the vertex without contrast.  Comparison: CT of the head performed 11/20/2007, and MRI of the brain performed 09/02/2011  Findings: Vaguely decreased attenuation at the inferior left frontal lobe may reflect a small infarct of indeterminate age. There is no evidence of mass lesion, or intra- or extra-axial hemorrhage on CT.  Diffuse periventricular and subcortical white matter change reflects small vessel ischemic microangiopathy.  Chronic ischemic change is noted at the external capsule bilaterally.  The posterior fossa, including the cerebellum, brainstem and fourth ventricle, is within normal limits.  The third and  lateral ventricles, and basal ganglia are unremarkable in appearance.  No mass effect or midline shift is seen.  There is no evidence of fracture; visualized osseous structures are unremarkable in appearance.  The orbits are within normal limits. There is mild partial opacification of the left maxillary sinus; the remaining paranasal sinuses and mastoid air cells are well- aerated.  No significant soft tissue abnormalities are seen.  IMPRESSION:  1.  Vaguely decreased attenuation at the inferior left frontal lobe may reflect a small infarct of indeterminate age.  No evidence of hemorrhagic transformation. 2.  Diffuse small vessel ischemic microangiopathy and chronic ischemic change at the external capsule bilaterally. 3.  Mild partial opacification of the left maxillary sinus.   Original Report Authenticated By: Tonia Ghent, M.D.       11:43 PM The patient is returned to her baseline mental status. She is now conversant per her baseline. She will now gaze to the left as well as the right  2:56 AM Partial relief of impaction with enema administered by nursing staff. Patient's mental status has been waxing and waning in the ED. Her family states she has a history of sundowning but this is worse than past episodes. She is noted to have urinary tract infection for which Rocephin has been started. A head CT is pending.  3:14 AM Tried hospitalist to admit. Because of abnormal CT findings altered metal status she will likely be transferred to [facility].     Hanley Seamen, MD 08/10/12 0981  Hanley Seamen, MD 08/10/12 [phone]

## 2012-08-10 ENCOUNTER — Observation Stay (HOSPITAL_COMMUNITY): Payer: Medicare Other

## 2012-08-10 ENCOUNTER — Emergency Department (HOSPITAL_COMMUNITY): Payer: Medicare Other

## 2012-08-10 ENCOUNTER — Encounter (HOSPITAL_COMMUNITY): Payer: Self-pay | Admitting: Internal Medicine

## 2012-08-10 DIAGNOSIS — R4182 Altered mental status, unspecified: Secondary | ICD-10-CM

## 2012-08-10 DIAGNOSIS — I679 Cerebrovascular disease, unspecified: Secondary | ICD-10-CM

## 2012-08-10 DIAGNOSIS — N390 Urinary tract infection, site not specified: Secondary | ICD-10-CM

## 2012-08-10 DIAGNOSIS — R410 Disorientation, unspecified: Secondary | ICD-10-CM | POA: Diagnosis present

## 2012-08-10 DIAGNOSIS — I6350 Cerebral infarction due to unspecified occlusion or stenosis of unspecified cerebral artery: Principal | ICD-10-CM

## 2012-08-10 DIAGNOSIS — E871 Hypo-osmolality and hyponatremia: Secondary | ICD-10-CM

## 2012-08-10 DIAGNOSIS — I639 Cerebral infarction, unspecified: Secondary | ICD-10-CM | POA: Diagnosis present

## 2012-08-10 LAB — BASIC METABOLIC PANEL
BUN: 9 mg/dL (ref 6–23)
CO2: 24 mEq/L (ref 19–32)
Calcium: 9 mg/dL (ref 8.4–10.5)
Chloride: 88 mEq/L — ABNORMAL LOW (ref 96–112)
Chloride: 89 mEq/L — ABNORMAL LOW (ref 96–112)
Creatinine, Ser: 0.57 mg/dL (ref 0.50–1.10)
Creatinine, Ser: 0.72 mg/dL (ref 0.50–1.10)
GFR calc Af Amer: >90 mL/min (ref >90)
GFR calc Af Amer: 88 mL/min — ABNORMAL LOW (ref >90)
GFR calc non Af Amer: 82 mL/min — ABNORMAL LOW (ref >90)
Potassium: 3.7 mEq/L (ref 3.5–5.1)
Sodium: 126 mEq/L — ABNORMAL LOW (ref 135–145)

## 2012-08-10 LAB — URINALYSIS, ROUTINE W REFLEX MICROSCOPIC
Bilirubin Urine: NEGATIVE
Nitrite: NEGATIVE
Specific Gravity, Urine: 1.007 (ref 1.005–1.030)
pH: 7.5 (ref 5.0–8.0)

## 2012-08-10 LAB — SODIUM, URINE, RANDOM: Sodium, Ur: 119 mEq/L

## 2012-08-10 LAB — OSMOLALITY, URINE: Osmolality, Ur: 298 mOsm/kg — ABNORMAL LOW (ref 390–1090)

## 2012-08-10 LAB — URINE MICROSCOPIC-ADD ON

## 2012-08-10 LAB — GLUCOSE, CAPILLARY: Glucose-Capillary: 86 mg/dL (ref 70–99)

## 2012-08-10 MED ORDER — PINDOLOL 5 MG PO TABS
5.0000 mg | ORAL_TABLET | Freq: Two times a day (BID) | ORAL | Status: DC
  Administered 2012-08-11 – 2012-08-20 (×15): 5 mg via ORAL
  Filled 2012-08-10 (×24): qty 1

## 2012-08-10 MED ORDER — LIDOCAINE HCL 2 % EX GEL
Freq: Once | CUTANEOUS | Status: AC
  Administered 2012-08-10: 10 via TOPICAL
  Filled 2012-08-10: qty 10

## 2012-08-10 MED ORDER — DIPHENHYDRAMINE HCL 50 MG/ML IJ SOLN
25.0000 mg | Freq: Once | INTRAMUSCULAR | Status: AC
  Administered 2012-08-10: 25 mg via INTRAVENOUS
  Filled 2012-08-10: qty 1

## 2012-08-10 MED ORDER — MINERAL OIL PO OIL
30.0000 mL | TOPICAL_OIL | Freq: Every day | ORAL | Status: DC | PRN
  Administered 2012-08-13: 30 mL via ORAL
  Filled 2012-08-10: qty 30

## 2012-08-10 MED ORDER — SENNOSIDES-DOCUSATE SODIUM 8.6-50 MG PO TABS
1.0000 | ORAL_TABLET | Freq: Two times a day (BID) | ORAL | Status: DC
  Administered 2012-08-11 – 2012-08-20 (×13): 1 via ORAL
  Filled 2012-08-10 (×14): qty 1

## 2012-08-10 MED ORDER — LORAZEPAM 2 MG/ML IJ SOLN
0.5000 mg | Freq: Once | INTRAMUSCULAR | Status: AC
  Administered 2012-08-10: 0.5 mg via INTRAVENOUS
  Filled 2012-08-10: qty 1

## 2012-08-10 MED ORDER — PHENAZOPYRIDINE HCL 200 MG PO TABS
200.0000 mg | ORAL_TABLET | Freq: Three times a day (TID) | ORAL | Status: DC
  Administered 2012-08-11 – 2012-08-12 (×3): 200 mg via ORAL
  Filled 2012-08-10 (×11): qty 1

## 2012-08-10 MED ORDER — DEXTROSE-NACL 5-0.9 % IV SOLN
INTRAVENOUS | Status: DC
Start: 2012-08-10 — End: 2012-08-14
  Administered 2012-08-10 – 2012-08-12 (×2): via INTRAVENOUS

## 2012-08-10 MED ORDER — PANTOPRAZOLE SODIUM 40 MG PO TBEC
40.0000 mg | DELAYED_RELEASE_TABLET | Freq: Every day | ORAL | Status: DC
  Administered 2012-08-12 – 2012-08-20 (×7): 40 mg via ORAL
  Filled 2012-08-10 (×8): qty 1

## 2012-08-10 MED ORDER — POLYVINYL ALCOHOL 1.4 % OP SOLN
1.0000 [drp] | OPHTHALMIC | Status: DC | PRN
  Administered 2012-08-13 – 2012-08-19 (×2): 1 [drp] via OPHTHALMIC
  Filled 2012-08-10: qty 15

## 2012-08-10 MED ORDER — ENSURE COMPLETE PO LIQD
237.0000 mL | Freq: Every day | ORAL | Status: DC
  Administered 2012-08-12 – 2012-08-20 (×7): 237 mL via ORAL

## 2012-08-10 MED ORDER — HYDRALAZINE HCL 20 MG/ML IJ SOLN
10.0000 mg | Freq: Four times a day (QID) | INTRAMUSCULAR | Status: DC | PRN
  Filled 2012-08-10 (×2): qty 0.5

## 2012-08-10 MED ORDER — DEXTROSE 5 % IV SOLN
1.0000 g | Freq: Once | INTRAVENOUS | Status: AC
  Administered 2012-08-10: 1 g via INTRAVENOUS
  Filled 2012-08-10: qty 10

## 2012-08-10 MED ORDER — LORAZEPAM 2 MG/ML IJ SOLN: 1.0000 mg | Freq: Four times a day (QID) | INTRAMUSCULAR | Status: DC | PRN

## 2012-08-10 MED ORDER — SODIUM CHLORIDE 0.9 % IV SOLN: INTRAVENOUS | Status: DC

## 2012-08-10 MED ORDER — SODIUM CHLORIDE 0.9 % IV SOLN
INTRAVENOUS | Status: DC
  Administered 2012-08-10: 06:00:00 via INTRAVENOUS

## 2012-08-10 MED ORDER — CIPROFLOXACIN HCL 0.3 % OP SOLN
1.0000 [drp] | OPHTHALMIC | Status: DC
  Administered 2012-08-10 – 2012-08-20 (×50): 1 [drp] via OPHTHALMIC
  Filled 2012-08-10 (×2): qty 2.5

## 2012-08-10 MED ORDER — MORPHINE SULFATE 2 MG/ML IJ SOLN: 1.0000 mg | INTRAMUSCULAR | Status: DC | PRN

## 2012-08-10 MED ORDER — ASPIRIN 81 MG PO CHEW
81.0000 mg | CHEWABLE_TABLET | Freq: Every day | ORAL | Status: DC
  Administered 2012-08-11 – 2012-08-20 (×9): 81 mg via ORAL
  Filled 2012-08-10 (×10): qty 1

## 2012-08-10 MED ORDER — ACETAMINOPHEN 650 MG RE SUPP
650.0000 mg | RECTAL | Status: DC | PRN
  Administered 2012-08-10 (×2): 650 mg via RECTAL
  Filled 2012-08-10 (×2): qty 1

## 2012-08-10 MED ORDER — METHYLCELLULOSE 1 % OP SOLN: 1.0000 [drp] | OPHTHALMIC | Status: DC | PRN

## 2012-08-10 MED ORDER — DEXTROSE 5 % IV SOLN
1.0000 g | INTRAVENOUS | Status: DC
  Administered 2012-08-10 – 2012-08-15 (×6): 1 g via INTRAVENOUS
  Filled 2012-08-10 (×7): qty 10

## 2012-08-10 MED ORDER — AZITHROMYCIN 1 % OP SOLN: 1.0000 [drp] | Freq: Every evening | OPHTHALMIC | Status: DC

## 2012-08-10 MED ORDER — DEXTROSE-NACL 5-0.45 % IV SOLN: INTRAVENOUS | Status: DC

## 2012-08-10 MED ORDER — POLYETHYLENE GLYCOL 3350 17 G PO PACK
17.0000 g | PACK | Freq: Every day | ORAL | Status: DC
  Administered 2012-08-12 – 2012-08-20 (×6): 17 g via ORAL
  Filled 2012-08-10 (×11): qty 1

## 2012-08-10 MED ORDER — ONDANSETRON HCL 4 MG/2ML IJ SOLN: 4.0000 mg | Freq: Four times a day (QID) | INTRAMUSCULAR | Status: DC | PRN

## 2012-08-10 MED ORDER — POLYETHYLENE GLYCOL 3350 17 GM/SCOOP PO POWD
17.0000 g | Freq: Every day | ORAL | Status: DC
  Filled 2012-08-10: qty 255

## 2012-08-10 MED ORDER — LEVOTHYROXINE SODIUM 75 MCG PO TABS
75.0000 ug | ORAL_TABLET | Freq: Every day | ORAL | Status: DC
  Administered 2012-08-12 – 2012-08-20 (×8): 75 ug via ORAL
  Filled 2012-08-10 (×13): qty 1

## 2012-08-10 MED ORDER — ACETAMINOPHEN 325 MG PO TABS
650.0000 mg | ORAL_TABLET | ORAL | Status: DC | PRN
  Administered 2012-08-11 – 2012-08-19 (×2): 650 mg via ORAL
  Filled 2012-08-10 (×2): qty 2

## 2012-08-10 MED ORDER — ASPIRIN 81 MG PO TABS
81.0000 mg | ORAL_TABLET | Freq: Every morning | ORAL | Status: DC
Start: 2012-08-10 — End: 2012-08-10

## 2012-08-10 MED ORDER — SODIUM CHLORIDE 0.9 % IV SOLN
INTRAVENOUS | Status: DC
  Administered 2012-08-10: 14:00:00 via INTRAVENOUS

## 2012-08-10 NOTE — Progress Notes (Signed)
Placed 49F foley in patient.  Site prepped with betadine and balloon inflated and intact prior to insertion.  Foley advanced until urine return noted.  Urine return is slow and very thick upon flow into drainage bag. Patient tolerated procedure well.  Bag placed below level of bladder at side of bed. Patient positioned to supine position and bed alarm reset.

## 2012-08-10 NOTE — Progress Notes (Signed)
Dr Benjamine Mola, Shanda Bumps paged and made aware of Pt's troponin 0.32.

## 2012-08-10 NOTE — Evaluation (Signed)
Physical Therapy Evaluation Patient Details Name: Margaret [name] MRN: [id number] DOB: 03/30/1924 Today's Date: 08/10/2012 Time: 4098-1191 PT Time Calculation (min): 14 min  PT Assessment / Plan / Recommendation Clinical Impression  Pt admitted with confusion and dysuria. Stoke work up being done. Unablet to gather home living or PLOF due to pt unable to provide and no family available. Evaluation limited seconadry to cognition. Recommending SNF at this time for increased safety and support. Pt will benefit from skilled PT in the acute care setting in order to maximize functional mobility and address the deficits below.    PT Assessment  Patient needs continued PT services    Follow Up Recommendations  SNF;Supervision/Assistance - 24 hour    Does the patient have the potential to tolerate intense rehabilitation      Barriers to Discharge        Equipment Recommendations  Other (comment) (TBD)    Recommendations for Other Services     Frequency Min 4X/week    Precautions / Restrictions Precautions Precautions: Fall Restrictions Weight Bearing Restrictions: No   Pertinent Vitals/Pain Pt nonverbal. No pain on faces scale.       Mobility  Bed Mobility Bed Mobility: Rolling Right;Rolling Left;Supine to Sit;Sitting - Scoot to [place] of Bed;Sit to Supine Rolling Right: 1: +2 Total assist Rolling Right: Patient Percentage: 10% Rolling Left: 1: +2 Total assist Rolling Left: Patient Percentage: 10% Supine to Sit: 1: +2 Total assist Supine to Sit: Patient Percentage: 10% Sitting - Scoot to Edge of Bed: 1: +2 Total assist Sitting - Scoot to Edge of Bed: Patient Percentage: 10% Sit to Supine: 1: +2 Total assist Sit to Supine: Patient Percentage: 10% Details for Bed Mobility Assistance: Pt resistant to movement. Assist to trunk and LEs for support in/out of bed. Transfers Transfers: Not assessed Ambulation/Gait Ambulation/Gait Assistance: Not tested (comment) Modified Rankin  (Stroke Patients Only) Pre-Morbid Rankin Score: No symptoms Modified Rankin: Severe disability    Shoulder Instructions     Exercises     PT Diagnosis: Altered mental status  PT Problem List: Decreased activity tolerance;Decreased mobility;Decreased balance;Decreased cognition;Decreased knowledge of use of DME;Decreased safety awareness;Decreased knowledge of precautions PT Treatment Interventions: DME instruction;Gait training;Functional mobility training;Therapeutic activities;Therapeutic exercise;Balance training;Neuromuscular re-education;Patient/family education   PT Goals Acute Rehab PT Goals PT Goal Formulation: Patient unable to participate in goal setting Time For Goal Achievement: 08/24/12 Potential to Achieve Goals: Fair Pt will go Supine/Side to Sit: with min assist PT Goal: Supine/Side to Sit - Progress: Goal set today Pt will go Sit to Supine/Side: with min assist PT Goal: Sit to Supine/Side - Progress: Goal set today Pt will go Sit to Stand: with mod assist PT Goal: Sit to Stand - Progress: Goal set today Pt will go Stand to Sit: with mod assist PT Goal: Stand to Sit - Progress: Goal set today Pt will Transfer Bed to Chair/Chair to Bed: with mod assist PT Transfer Goal: Bed to Chair/Chair to Bed - Progress: Goal set today  Visit Information  Last PT Received On: 08/10/12 Assistance Needed: +2 PT/OT Co-Evaluation/Treatment: Yes    Subjective Data  Patient Stated Goal: pt nonverbal, no goal stated   Prior Functioning  Home Living Additional Comments: Pt unable to provide home living information, and family unavailable. Prior Function Comments: Pt unable to provide PLOF, and family unavailable.  Communication Communication: Other (comment) (nonverbal most of session, intermittently mumbling phrases )    Cognition  Overall Cognitive Status: No family/caregiver present to determine baseline cognitive functioning  Arousal/Alertness: Lethargic Behavior During  Session: Lethargic    Extremity/Trunk Assessment Right Upper Extremity Assessment RUE ROM/Strength/Tone: Unable to fully assess;Due to impaired cognition Left Upper Extremity Assessment LUE ROM/Strength/Tone: Unable to fully assess;Due to impaired cognition Right Lower Extremity Assessment RLE ROM/Strength/Tone: Deficits;Unable to fully assess;Due to impaired cognition RLE ROM/Strength/Tone Deficits: Pt not following cues for MMT, noted movement in bed Left Lower Extremity Assessment LLE ROM/Strength/Tone: Deficits;Unable to fully assess;Due to impaired cognition LLE ROM/Strength/Tone Deficits: Pt not following cues for MMT, noted movement in bed   Balance Balance Balance Assessed: Yes Static Sitting Balance Static Sitting - Balance Support: Feet supported Static Sitting - Level of Assistance: 2: Max assist Static Sitting - Comment/# of Minutes: Sat EOB 1 min.  End of Session PT - End of Session Activity Tolerance: Patient limited by fatigue;Other (comment) (limited secondary to cognition) Patient left: in bed;with call bell/phone within reach;with nursing in room;with bed alarm set Nurse Communication: Mobility status  GP Functional Assessment Tool Used: clinical juidgement  Functional Limitation: Mobility: Walking and moving around Mobility: Walking and Moving Around Current Status (Z6109): 100 percent impaired, limited or restricted Mobility: Walking and Moving Around Goal Status (U0454): At least 20 percent but less than 40 percent impaired, limited or restricted   Milana Kidney 08/10/2012, 9:26 AM  08/10/2012 Milana Kidney DPT PAGER: [phone] OFFICE: [phone]

## 2012-08-10 NOTE — Progress Notes (Addendum)
Patient admitted early this AM for a CVA work up.  Patient is not talking at this time nor following commands- will  Was given ativan which may have caused this- will d/c.  Also has UTI- culture pending.  Await CVA work up including MRI.  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  ? Baseline as no family present to give information.  Marlin Canary DO

## 2012-08-10 NOTE — Progress Notes (Signed)
[facility] hospitalist progress note. Chief complaint. Transfer note. History of present illness. This 78 year old female presented to [facility] long emergency room with confusion and dysuria. She was evaluated in the emergency room and found to have UTI. A CT scan of the brain was done and this was worrisome for CVA of indeterminate age. She was transferred to [facility] for further stroke workup. The patient has now arrived in am seeing her at bedside to ensure she remains stable clinically and that her orders transferred appropriately. Time the patient alert and somewhat restless. She is nonverbal and does not follow commands. Vital signs. Temperature 97.9, pulse 103, respiration 18, blood pressure 163/93. O2 sats 94%. General appearance. Thin, frail elderly female in no obvious distress. She is alert but nonverbal and does not follow commands. Cardiac. Rate and rhythm regular. No jugular venous distention or edema. Lungs. Somewhat decreased in the bases with poor inspiratory effort. Lung fields are clear with no distress noted. O2 sats are stable. Abdomen. Soft with positive bowel sounds. No pain noted with palpation. Neurologic. Cranial nerves 2-12 grossly intact to exam difficult due to to the degree of cooperation. She moves all 4 extremities independently and strength appears roughly equal. Impression/plan. Problem #1 altered mental status with acute delirium. Probably multifactorial with metabolic encephalopathy do to UTI, hyponatremia, and possible new CVA. Patient will have stroke workup per protocol including MRI/MRA, carotid Doppler, echocardiogram, etc. Patient appears clinically stable post transfer. All orders appear to of transferred appropriately.

## 2012-08-10 NOTE — H&P (Signed)
PCP:    Michele Mcalpine, MD    Chief Complaint:   Confusion, dysuria HPI: Margaret [name] is a 78 y.o. female   has a past medical history of Unspecified sinusitis (chronic); Unspecified essential hypertension; Cerebrovascular disease, unspecified; Unspecified venous (peripheral) insufficiency; Pure hypercholesterolemia; Unspecified disorder of thyroid; Esophageal reflux; Unspecified constipation; Urinary tract infection, site not specified; Osteoarthrosis, unspecified whether generalized or localized, unspecified site; Degeneration of cervical intervertebral disc; Closed fracture of other bone of wrist; Anxiety state, unspecified; Idiopathic urticaria; Anemia, unspecified; Other B-complex deficiencies; and Herpes zoster without mention of complication.   Presented with  She was moving today to Independent living at [facility] and started to act agitated. She was taken to Er and was found to have UTI. CT scan was worrisome for CVA of indetermined age. Today she took alprazolam for the first time today to deal with anxiety of the move. Her main complaint is dysuria.  She has severe constipation she have not had a bowel movement for the past 3 days. She had to have an enema when she first came in ER with large amount of stool produced.  While in ER her mental status have severely deteriorated and she began to be combative receiving benadryl and ativan IV.    Review of Systems:     Pertinent positives include: dysuria, confusion  Constitutional:  No weight loss, night sweats, Fevers, chills, fatigue, weight loss  HEENT:  No headaches, Difficulty swallowing,Tooth/dental problems,Sore throat,  No sneezing, itching, ear ache, nasal congestion, post nasal drip,  Cardio-vascular:  No chest pain, Orthopnea, PND, anasarca, dizziness, palpitations.no Bilateral lower extremity swelling  GI:  No heartburn, indigestion, abdominal pain, nausea, vomiting, diarrhea, change in bowel habits, loss of appetite,  melena, blood in stool, hematemesis Resp:  no shortness of breath at rest. No dyspnea on exertion, No excess mucus, no productive cough, No non-productive cough, No coughing up of blood.No change in color of mucus.No wheezing. Skin:  no rash or lesions. No jaundice GU:  no dysuria, change in color of urine, no urgency or frequency. No straining to urinate.  No flank pain.  Musculoskeletal:  No joint pain or no joint swelling. No decreased range of motion. No back pain.  Psych:  No change in mood or affect. No depression or anxiety. No memory loss.  Neuro: no localizing neurological complaints, no tingling, no weakness, no double vision, no gait abnormality, no slurred speech   Otherwise ROS are negative except for above, 10 systems were reviewed  Past Medical History: Past Medical History  Diagnosis Date  . Unspecified sinusitis (chronic)   . Unspecified essential hypertension   . Cerebrovascular disease, unspecified   . Unspecified venous (peripheral) insufficiency   . Pure hypercholesterolemia   . Unspecified disorder of thyroid   . Esophageal reflux   . Unspecified constipation   . Urinary tract infection, site not specified   . Osteoarthrosis, unspecified whether generalized or localized, unspecified site   . Degeneration of cervical intervertebral disc   . Closed fracture of other bone of wrist   . Anxiety state, unspecified   . Idiopathic urticaria   . Anemia, unspecified   . Other B-complex deficiencies   . Herpes zoster without mention of complication    Past Surgical History  Procedure Date  . Tonsillectomy   . Total abdominal hysterectomy w/ bilateral salpingoophorectomy   . Cervical fusion   . Cataract extraction   . Nasal sinus surgery   . Esophagogastroduodenoscopy 09/10/2011    Procedure:  ESOPHAGOGASTRODUODENOSCOPY (EGD);  Surgeon: Petra Kuba, MD;  Location: [facility] ENDOSCOPY;  Service: Endoscopy;  Laterality: N/A;  c-arm needed  . Savory dilation 09/10/2011     Procedure: SAVORY DILATION;  Surgeon: Petra Kuba, MD;  Location: [facility] ENDOSCOPY;  Service: Endoscopy;  Laterality: N/A;  . Esophagogastroduodenoscopy 05/13/2012    Procedure: ESOPHAGOGASTRODUODENOSCOPY (EGD);  Surgeon: Petra Kuba, MD;  Location: [facility] ENDOSCOPY;  Service: Endoscopy;  Laterality: N/A;  with carm  . Savory dilation 05/13/2012    Procedure: SAVORY DILATION;  Surgeon: Petra Kuba, MD;  Location: [facility] ENDOSCOPY;  Service: Endoscopy;  Laterality: N/A;     Medications: Prior to Admission medications   Medication Sig Start Date End Date Taking? Authorizing Provider  ALPRAZolam (XANAX) 0.25 MG tablet Take 0.125 mg by mouth every 6 (six) hours as needed. For anxiety 08/01/12  Yes Nyoka Cowden, MD  aspirin 81 MG tablet Take 81 mg by mouth every morning.    Yes Historical Provider, MD  azithromycin (AZASITE) 1 % ophthalmic solution Place 1 drop into both eyes every evening.    Yes Historical Provider, MD  cyanocobalamin 1000 MCG tablet Take 1,000 mcg by mouth every morning.    Yes Historical Provider, MD  feeding supplement (ENSURE COMPLETE) LIQD Take 237 mLs by mouth daily. 04/28/12  Yes Meredeth Ide, MD  lansoprazole (PREVACID) 30 MG capsule Take 30 mg by mouth every morning.    Yes Historical Provider, MD  levothyroxine (SYNTHROID, LEVOTHROID) 75 MCG tablet Take 75 mcg by mouth every morning.    Yes Historical Provider, MD  methylcellulose (ARTIFICIAL TEARS) 1 % ophthalmic solution Place 1 drop into both eyes as needed.   Yes Historical Provider, MD  mineral oil liquid Take 30 mLs by mouth daily as needed. For constipation   Yes Historical Provider, MD  pindolol (VISKEN) 5 MG tablet Take 5 mg by mouth 2 (two) times daily.   Yes Pricilla Riffle, MD  polyethylene glycol powder (GLYCOLAX/MIRALAX) powder Take 17 g by mouth daily.   Yes Historical Provider, MD  senna-docusate (SENOKOT-S) 8.6-50 MG per tablet Take 1 tablet by mouth 2 (two) times daily. 04/28/12  Yes Meredeth Ide, MD  vitamin D,  CHOLECALCIFEROL, 400 UNITS tablet Take 400 Units by mouth every 7 (seven) days. On Thursdays   Yes Historical Provider, MD    Allergies:   Allergies  Allergen Reactions  . Aspirin Hives    High Doses    Social History:  Ambulatory   independently   Lives at now will be in Independent living.    reports that she has never smoked. She has never used smokeless tobacco. She reports that she does not drink alcohol or use illicit drugs.   Family History: family history includes Diabetes in her father and sister and Heart failure in her mother.    Physical Exam: Patient Vitals for the past 24 hrs:  BP Temp Temp src Pulse Resp SpO2  08/09/12 2144 163/93 mmHg 97.9 F (36.6 C) Oral 103  18  94 %    1. General:  patient is actively combative 2. Psychological: Alert but not Oriented 3. Head/ENT:    Dry Mucous Membranes                          Head Non traumatic, neck supple  Normal  Dentition 4. SKIN: decreased Skin turgor,  Skin clean Dry and intact no rash, numerous bruising noted.  5. Heart: Regular rate and rhythm no Murmur, Rub or gallop 6. Lungs: Clear to auscultation bilaterally, no wheezes or crackles   7. Abdomen: Soft, non-tender, Non distended 8. Lower extremities: no clubbing, cyanosis, or edema 9. Neurologically Grossly intact, moving all 4 extremities equally but unable to cooperate with exam 10. MSK: Normal range of motion  body mass index is unknown because there is no height or weight on file.   Labs on Admission:   [facility] 08/09/12 2245  NA 121*  K 4.0  CL 86*  CO2 25  GLUCOSE 114*  BUN 12  CREATININE 0.52  CALCIUM 8.9  MG --  PHOS --    Basename 08/09/12 2245  AST 31  ALT 15  ALKPHOS 75  BILITOT 0.6  PROT 7.2  ALBUMIN 3.9   No results found for this basename: LIPASE:2,AMYLASE:2 in the last 72 hours  Basename 08/09/12 2245  WBC 5.8  NEUTROABS 3.1  HGB 10.5*  HCT 28.8*  MCV 83.7  PLT 218   No results found  for this basename: CKTOTAL:3,CKMB:3,CKMBINDEX:3,TROPONINI:3 in the last 72 hours No results found for this basename: TSH,T4TOTAL,FREET3,T3FREE,THYROIDAB in the last 72 hours No results found for this basename: VITAMINB12:2,FOLATE:2,FERRITIN:2,TIBC:2,IRON:2,RETICCTPCT:2 in the last 72 hours No results found for this basename: HGBA1C    The CrCl is unknown because both a height and weight (above a minimum accepted value) are required for this calculation. ABG No results found for this basename: phart, pco2, po2, hco3, tco2, acidbasedef, o2sat     No results found for this basename: DDIMER    UA  Evidence of infection    Cultures:    Component Value Date/Time   SDES SPUTUM 04/28/2012 1523   SPECREQUEST NONE 04/28/2012 1523   CULT NO ACID FAST BACILLI ISOLATED IN 6 WEEKS 04/28/2012 1523   REPTSTATUS 06/10/2012 FINAL 04/28/2012 1523       Radiological Exams on Admission: Ct Head Wo Contrast  08/10/2012  *RADIOLOGY REPORT*  Clinical Data: Altered level of consciousness.  CT HEAD WITHOUT CONTRAST  Technique:  Contiguous axial images were obtained from the base of the skull through the vertex without contrast.  Comparison: CT of the head performed 11/20/2007, and MRI of the brain performed 09/02/2011  Findings: Vaguely decreased attenuation at the inferior left frontal lobe may reflect a small infarct of indeterminate age. There is no evidence of mass lesion, or intra- or extra-axial hemorrhage on CT.  Diffuse periventricular and subcortical white matter change reflects small vessel ischemic microangiopathy.  Chronic ischemic change is noted at the external capsule bilaterally.  The posterior fossa, including the cerebellum, brainstem and fourth ventricle, is within normal limits.  The third and lateral ventricles, and basal ganglia are unremarkable in appearance.  No mass effect or midline shift is seen.  There is no evidence of fracture; visualized osseous structures are unremarkable in  appearance.  The orbits are within normal limits. There is mild partial opacification of the left maxillary sinus; the remaining paranasal sinuses and mastoid air cells are well- aerated.  No significant soft tissue abnormalities are seen.  IMPRESSION:  1.  Vaguely decreased attenuation at the inferior left frontal lobe may reflect a small infarct of indeterminate age.  No evidence of hemorrhagic transformation. 2.  Diffuse small vessel ischemic microangiopathy and chronic ischemic change at the external capsule bilaterally. 3.  Mild partial opacification of the left maxillary  sinus.   Original Report Authenticated By: Tonia Ghent, M.D.     Chart has been reviewed  Assessment/Plan  78 yo F with delirium, UTI and possible CVA  Present on Admission:   . Confusion and delirium - patient likely with metabolic encephalopathy due to UTI, hyponatremia and possible new CVA, will admit and treat supportively . CVA (cerebral infarction) - CT scan showing possible CVA unclear if acute or subacute.  - will admit based on TIA/CVA protocol, await results of MRA/MRI, Carotid Doppler and Echo, obtain cardiac enzymes,  ECG,   Lipid panel, TSH. Order PT/OT evaluation. Will make sure patient is on antiplatelet agent. She has hx of hives with higher dose of aspirin for now will continue at 81 mg po qd.   Neurology consult if MRI confirms CVA.      . CONSTIPATION - continue aggressive bowel regimen  . UTI (lower urinary tract infection) - rocephin and await result of urine culture . Hyponatremia - she has hx of chronic hyponatremia with baseline Na of 125-130. Currently NA down to 121 she appears fluid depleted with dry mucous membranes and evidence of infection vs CVA. Will obtain Urine NA, Cr, osm, TSH and Am cortisol, Given clinical picture will give gentle IVF fluids and repeat Na in a few hours.    Prophylaxis: SCD  Protonix  CODE STATUS: DNR/DNI as per family  Other plan as per orders.  I have spent a  total of 60 min on this admission  Alija Riano 08/10/2012, 3:28 AM

## 2012-08-10 NOTE — Progress Notes (Signed)
VASCULAR LAB PRELIMINARY  PRELIMINARY  PRELIMINARY  PRELIMINARY  Carotid duplex completed.    Preliminary report:  Bilateral:  No evidence of hemodynamically significant internal carotid artery stenosis.   Vertebral artery flow is antegrade.     Jonice Cerra, RVS 08/10/2012, 10:38 AM

## 2012-08-10 NOTE — Progress Notes (Signed)
Pt lethargic but arousable  Upon admission to floor; not following commands. Unable to do NIH score. Admission history provided by daughter at the bedside, visiting.

## 2012-08-10 NOTE — Progress Notes (Signed)
SLP Cancellation Note  Patient Details Name: FANNYE MYER MRN: [id number] DOB: Apr 16, 1924   Cancelled treatment:  ST received order for BSE.  Attempt x1 and not completed due to patient presenting with lethargy with inability to participate fully to complete evaluation. ST to re attempt on 08/11/12.  Moreen Fowler MS, CCC-SLP [phone] [facility] 08/10/2012, 5:46 PM

## 2012-08-10 NOTE — Evaluation (Signed)
Occupational Therapy Evaluation Patient Details Name: Margaret [name] MRN: [id number] DOB: 26-Aug-1923 Today's Date: 08/10/2012 Time: 4098-1191 OT Time Calculation (min): 13 min  OT Assessment / Plan / Recommendation Clinical Impression  Pt admitted with confusion and dysuria.  Stoke work up being done.  Unablet to gather home living or PLOF due to pt unable to provide and no family available.  Currently recommending SNF for d/c planning. Will follow acutely to address below problem list.    OT Assessment  Patient needs continued OT Services    Follow Up Recommendations  SNF    Barriers to Discharge      Equipment Recommendations  Other (comment) (TBD)    Recommendations for Other Services    Frequency  Min 2X/week    Precautions / Restrictions Precautions Precautions: Fall Restrictions Weight Bearing Restrictions: No   Pertinent Vitals/Pain See vitals    ADL  Upper Body Bathing: Simulated;+1 Total assistance Where Assessed - Upper Body Bathing: Supine, head of bed up Lower Body Bathing: Simulated;+1 Total assistance Where Assessed - Lower Body Bathing: Supine, head of bed up;Rolling right and/or left Upper Body Dressing: Simulated;+1 Total assistance Where Assessed - Upper Body Dressing: Supine, head of bed up Lower Body Dressing: Simulated;+1 Total assistance Where Assessed - Lower Body Dressing: Supine, head of bed up;Rolling right and/or left Transfers/Ambulation Related to ADLs: +2 for bed mobility    OT Diagnosis: Generalized weakness;Cognitive deficits  OT Problem List: Impaired balance (sitting and/or standing);Decreased activity tolerance;Decreased strength;Decreased cognition;Decreased knowledge of use of DME or AE OT Treatment Interventions: Self-care/ADL training;DME and/or AE instruction;Therapeutic activities;Patient/family education;Balance training;Cognitive remediation/compensation   OT Goals Acute Rehab OT Goals OT Goal Formulation: Patient unable to  participate in goal setting Time For Goal Achievement: 08/24/12 Potential to Achieve Goals: Fair ADL Goals Pt Will Perform Grooming: with mod assist;Supine, head of bed up;Other (comment) (hand over hand assist to initiate) ADL Goal: Grooming - Progress: Goal set today Miscellaneous OT Goals Miscellaneous OT Goal #1: Pt will initiate rolling to L/R sides 50% of time with min verbal cueing. OT Goal: Miscellaneous Goal #1 - Progress: Goal set today Miscellaneous OT Goal #2: Pt will follow simple one step commands 50% of time. OT Goal: Miscellaneous Goal #2 - Progress: Goal set today  Visit Information  Last OT Received On: 08/10/12 Assistance Needed: +2 PT/OT Co-Evaluation/Treatment: Yes    Subjective Data      Prior Functioning     Home Living Additional Comments: Pt unable to provide home living information, and family unavailable. Prior Function Comments: Pt unable to provide PLOF, and family unavailable.  Communication Communication: Other (comment) (nonverbal most of session, intermittently mumbling phrases )         Vision/Perception     Cognition  Overall Cognitive Status: No family/caregiver present to determine baseline cognitive functioning Arousal/Alertness: Lethargic Behavior During Session: Lethargic    Extremity/Trunk Assessment Right Upper Extremity Assessment RUE ROM/Strength/Tone: Unable to fully assess;Due to impaired cognition Left Upper Extremity Assessment LUE ROM/Strength/Tone: Unable to fully assess;Due to impaired cognition     Mobility Bed Mobility Bed Mobility: Rolling Right;Rolling Left;Supine to Sit;Sitting - Scoot to [place] of Bed;Sit to Supine Rolling Right: 1: +2 Total assist Rolling Right: Patient Percentage: 10% Rolling Left: 1: +2 Total assist Rolling Left: Patient Percentage: 10% Supine to Sit: 1: +2 Total assist Supine to Sit: Patient Percentage: 10% Sitting - Scoot to Edge of Bed: 1: +2 Total assist Sitting - Scoot to Edge of  Bed: Patient Percentage: 10% Sit  to Supine: 1: +2 Total assist Sit to Supine: Patient Percentage: 10% Details for Bed Mobility Assistance: Pt resistant to movement. Assist to trunk and LEs for support in/out of bed.     Shoulder Instructions     Exercise     Balance Balance Balance Assessed: Yes Static Sitting Balance Static Sitting - Balance Support: Feet supported Static Sitting - Level of Assistance: 2: Max assist Static Sitting - Comment/# of Minutes: Sat EOB 1 min.   End of Session OT - End of Session Activity Tolerance: Patient limited by fatigue Patient left: in bed;with call bell/phone within reach;with nursing in room Nurse Communication: Mobility status  GO Functional Assessment Tool Used: clinical judgement Functional Limitation: Self care Self Care Current Status (Z6109): 100 percent impaired, limited or restricted Self Care Goal Status (U0454): At least 60 percent but less than 80 percent impaired, limited or restricted  08/10/2012 Cipriano Mile OTR/L Pager [phone] Office [phone]  Cipriano Mile 08/10/2012, 9:05 AM

## 2012-08-11 DIAGNOSIS — F29 Unspecified psychosis not due to a substance or known physiological condition: Secondary | ICD-10-CM

## 2012-08-11 LAB — BASIC METABOLIC PANEL
Calcium: 9.1 mg/dL (ref 8.4–10.5)
GFR calc non Af Amer: 59 mL/min — ABNORMAL LOW (ref >90)
Glucose, Bld: 112 mg/dL — ABNORMAL HIGH (ref 70–99)
Potassium: 3.4 mEq/L — ABNORMAL LOW (ref 3.5–5.1)
Sodium: 124 mEq/L — ABNORMAL LOW (ref 135–145)

## 2012-08-11 LAB — GLUCOSE, CAPILLARY
Glucose-Capillary: 119 mg/dL — ABNORMAL HIGH (ref 70–99)
Glucose-Capillary: 116 mg/dL — ABNORMAL HIGH (ref 70–99)
Glucose-Capillary: 165 mg/dL — ABNORMAL HIGH (ref 70–99)

## 2012-08-11 LAB — LIPID PANEL: Cholesterol: 200 mg/dL (ref 0–200)

## 2012-08-11 LAB — HEMOGLOBIN A1C
Hgb A1c MFr Bld: 5.8 % — ABNORMAL HIGH (ref <5.7)
Mean Plasma Glucose: 120 mg/dL — ABNORMAL HIGH (ref <117)

## 2012-08-11 LAB — URINE CULTURE

## 2012-08-11 MED ORDER — POTASSIUM CHLORIDE CRYS ER 20 MEQ PO TBCR
40.0000 meq | EXTENDED_RELEASE_TABLET | Freq: Once | ORAL | Status: AC
  Administered 2012-08-11: 40 meq via ORAL
  Filled 2012-08-11: qty 2

## 2012-08-11 MED ORDER — WHITE PETROLATUM GEL
Status: AC
  Administered 2012-08-11: 12:00:00
  Filled 2012-08-11: qty 5

## 2012-08-11 NOTE — Progress Notes (Signed)
Physical Therapy Treatment Patient Details Name: Margaret [name] MRN: [id number] DOB: 07-13-1924 Today's Date: 08/11/2012 Time: 4098-1191 PT Time Calculation (min): 19 min  PT Assessment / Plan / Recommendation Comments on Treatment Session  Pt making good progress with mobility & PT goals at this date.  Ambulated 42' with RW.  Very pleasant & willing to participate.  PT goals updated at this date & gait goal added.       Follow Up Recommendations  SNF;Supervision/Assistance - 24 hour     Does the patient have the potential to tolerate intense rehabilitation     Barriers to Discharge        Equipment Recommendations  Other (comment) (TBD)    Recommendations for Other Services    Frequency Min 4X/week   Plan      Precautions / Restrictions Precautions Precautions: Fall Restrictions Weight Bearing Restrictions: No       Mobility  Bed Mobility Bed Mobility: Supine to Sit;Sitting - Scoot to Edge of Bed Supine to Sit: 4: Min guard Sitting - Scoot to [place] of Bed: 4: Min guard Details for Bed Mobility Assistance: no physical (A) needed.  Cues for technique.   Transfers Transfers: Sit to Stand;Stand to Sit Sit to Stand: 4: Min assist Stand to Sit: 4: Min assist Details for Transfer Assistance: cues for technique.  (A) for balance & safety.   Ambulation/Gait Ambulation/Gait Assistance: 4: Min assist Ambulation Distance (Feet): 60 Feet Assistive device: Rolling walker Ambulation/Gait Assistance Details: (A) for balance & safety.  Cues for RW management & safe body positioning inside RW.   Gait Pattern: Step-through pattern;Decreased stride length;Narrow base of support Modified Rankin (Stroke Patients Only) Pre-Morbid Rankin Score: No symptoms Modified Rankin: Moderately severe disability     PT Goals Acute Rehab PT Goals Time For Goal Achievement: 08/24/12 Potential to Achieve Goals: Fair Pt will go Supine/Side to Sit: with min assist PT Goal: Supine/Side to Sit -  Progress: Met Pt will go Sit to Supine/Side: with min assist Pt will go Sit to Stand: with modified independence PT Goal: Sit to Stand - Progress: Updated due to goal met Pt will go Stand to Sit: with modified independence PT Goal: Stand to Sit - Progress: Updated due to goals met Pt will Transfer Bed to Chair/Chair to Bed: with modified independence PT Transfer Goal: Bed to Chair/Chair to Bed - Progress: Updated due to goal met Pt will Ambulate: with modified independence;51 - 150 feet;with least restrictive assistive device PT Goal: Ambulate - Progress: Goal set today  Visit Information  Last PT Received On: 08/11/12 Assistance Needed: +1    Subjective Data      Cognition  Overall Cognitive Status: Impaired Arousal/Alertness: Awake/alert Orientation Level: Disoriented to;Place Behavior During Session: [facility] for tasks performed    Balance  Balance Balance Assessed: No  End of Session PT - End of Session Equipment Utilized During Treatment: Gait belt Activity Tolerance: Patient tolerated treatment well Patient left: in chair;with call bell/phone within reach Nurse Communication: Mobility status     Verdell Face, [place] [phone] 08/11/2012

## 2012-08-11 NOTE — Care Management Note (Signed)
    Page 1 of 2   08/20/2012     1:42:59 PM   CARE MANAGEMENT NOTE 08/20/2012  Patient:  Margaret, [name]   Account Number:  [id number]  Date Initiated:  08/11/2012  Documentation initiated by:  [facility]  Subjective/Objective Assessment:   Admitted with AMS, UTI, possible CVA     Action/Plan:   PT/OT evals-recommending SNF or HHPT and HHOT   Anticipated DC Date:  08/21/2011   Anticipated DC Plan:  HOME W HOME HEALTH SERVICES  In-house referral  Clinical Social Worker      DC Planning Services  CM consult      Choice offered to / List presented to:  C-4 Adult Children        HH arranged  HH-1 RN  HH-2 PT  HH-3 OT  HH-4 NURSE'S AIDE  HH-5 SPEECH THERAPY  HH-6 SOCIAL WORKER      HH agency  [facility]   Status of service:  Completed, signed off Medicare Important Message given?   (If response is "NO", the following Medicare IM given date fields will be blank) Date Medicare IM given:   Date Additional Medicare IM given:    Discharge Disposition:  HOME W HOME HEALTH SERVICES  Per UR Regulation:  Reviewed for med. necessity/level of care/duration of stay  If discussed at Long Length of Stay Meetings, dates discussed:   08/19/2012    Comments:  08/20/12 Due to financial restraints patient and daughter have decided to have patient return home to her independent living facillity with [facility] and family support. They decided that they want [facility] instead of [facility]. Contacted Payzlee Ryder at [place] and set up [facility], PT, OT, ST, aide and SW. [employer] and informed them that patient has selected another agency. No equipment needs identified. Jacquelynn Cree RN, BSN, CCM   08/20/11 PT/OT recommending SNF post CVA, bed available 08/20/12. Jacquelynn Cree RN, BSN, CCM   08/12/12 [facility], PT, OT and aide ordered.Veriifed with patient 's daughter that they still wanted [facility]. Contacted Diane at [place] and requested [facility], PT,OT and aide with anticipated d/c 08/13/12.She requested  order, face to face, facesheet, Hand P, PT eval and OT eval be faxed to [phone].Faxed requested info. Verified information received per Diane. Toy Cookey RN, BSN, CCM HHST added to order, faxed modified order and verified receipt with Diane at [facility]. Jacquelynn Cree RN, BSN, CCM    08/11/12 Spoke with patient, daughter and son in law about d/c plans. Patient d/c'd from SNF 3 wks ago, moved to an independent living facillity a few days ago. She had [facility] recently after d/c from SNF. Family thinks that patient will be able to d/c to independent living facillity with HHC based on her improvement and that she appears to be getting back to her baseline. They have looked into Medicaid but patient's income is slightly over the Medicaid requirement.  CSW and CM will continue to follow for discharge needs.  Jacquelynn Cree RN, BSN, CCM

## 2012-08-11 NOTE — Progress Notes (Signed)
Unconfirmed EKG for stroke w/u completed and in the chart.

## 2012-08-11 NOTE — Evaluation (Signed)
Clinical/Bedside Swallow Evaluation Patient Details  Name: Margaret [name] MRN: [id number] Date of Birth: 03/20/1924  Today's Date: 08/11/2012 Time: 1010-1045 SLP Time Calculation (min): 35 min  Past Medical History:  Past Medical History  Diagnosis Date  . Unspecified sinusitis (chronic)   . Unspecified essential hypertension   . Cerebrovascular disease, unspecified   . Unspecified venous (peripheral) insufficiency   . Pure hypercholesterolemia   . Unspecified disorder of thyroid   . Esophageal reflux   . Unspecified constipation   . Urinary tract infection, site not specified   . Osteoarthrosis, unspecified whether generalized or localized, unspecified site   . Degeneration of cervical intervertebral disc   . Closed fracture of other bone of wrist   . Anxiety state, unspecified   . Idiopathic urticaria   . Anemia, unspecified   . Other B-complex deficiencies   . Herpes zoster without mention of complication    Past Surgical History:  Past Surgical History  Procedure Date  . Tonsillectomy   . Total abdominal hysterectomy w/ bilateral salpingoophorectomy   . Cervical fusion   . Cataract extraction   . Nasal sinus surgery   . Esophagogastroduodenoscopy 09/10/2011    Procedure: ESOPHAGOGASTRODUODENOSCOPY (EGD);  Surgeon: Petra Kuba, MD;  Location: [facility] ENDOSCOPY;  Service: Endoscopy;  Laterality: N/A;  c-arm needed  . Savory dilation 09/10/2011    Procedure: SAVORY DILATION;  Surgeon: Petra Kuba, MD;  Location: [facility] ENDOSCOPY;  Service: Endoscopy;  Laterality: N/A;  . Esophagogastroduodenoscopy 05/13/2012    Procedure: ESOPHAGOGASTRODUODENOSCOPY (EGD);  Surgeon: Petra Kuba, MD;  Location: [facility] ENDOSCOPY;  Service: Endoscopy;  Laterality: N/A;  with carm  . Savory dilation 05/13/2012    Procedure: SAVORY DILATION;  Surgeon: Petra Kuba, MD;  Location: [facility] ENDOSCOPY;  Service: Endoscopy;  Laterality: N/A;   HPI:  78 yr old admitted with confusion and dysuria.  Found to have UTI  and hypokalemia and failed RN swallow screen.  CXR 12/29 did not reveal abnormalitites.  Significant pharyngeal and esophageal dysphgia history.  Esophageal dilation x 7 with most recent 10/13, MBS 08/31/11 with significant esophageal dysphagia with thin liquid recommended (pt. underwent esophageal dilitaiton soon after MBS).  History of cervical surgery with hardware seen to impinge on UES functioning.Family states pt. has only had "a touch of pna once this past Sept."  She is mobile with a walker.  Family reports frequent throat clearing and some coughing during meals at home which has "gone on for years."    Assessment / Plan / Recommendation Clinical Impression  Pt. demonstrated mild oropharyngeal dysphagia.  Pt. has history of chronic and significant esophageal dysphagia with multiple dilitations, cervical hardware impinging UES function during MBS 08/31/11.  Admitted now with UTI.  SLP does not recommend objective assessment as family does not report an acute change in oropharyngeal swallow function from premorbid status.  Recommend thin liquids via small straw sips (pt. prefers straw,; explained possible increased risk with pt/family) and Dys 3 diet.  ST will follow and will downgrade if texture is too difficult to masiticate and manipulate.     Aspiration Risk  Severe (mod-severe)    Diet Recommendation Dysphagia 3 (Mechanical Soft);Thin liquid   Liquid Administration via: Cup;Straw Medication Administration: Whole meds with puree Supervision: Patient able to self feed;Full supervision/cueing for compensatory strategies Compensations: Slow rate;Small sips/bites Postural Changes and/or Swallow Maneuvers: Seated upright 90 degrees;Upright 30-60 min after meal    Other  Recommendations Oral Care Recommendations: Oral care BID   Follow Up  Recommendations  Skilled Nursing facility (??)    Frequency and Duration min 2x/week  2 weeks       SLP Swallow Goals Patient will utilize recommended  strategies during swallow to increase swallowing safety with: Moderate cueing   Swallow Study Prior Functional Status          Oral/Motor/Sensory Function Overall Oral Motor/Sensory Function:  (generalized weakness)   Ice Chips Ice chips: Impaired Pharyngeal Phase Impairments: Throat Clearing - Delayed   Thin Liquid Thin Liquid: Impaired Presentation: Cup;Spoon;Straw Pharyngeal  Phase Impairments: Throat Clearing - Delayed;Throat Clearing - Immediate    Nectar Thick Nectar Thick Liquid: Not tested   Honey Thick Honey Thick Liquid: Not tested   Puree Puree: Impaired Pharyngeal Phase Impairments: Multiple swallows   Solid       Solid: Not tested       Royce Macadamia M.Ed [employer] [phone]  08/11/2012

## 2012-08-11 NOTE — Progress Notes (Signed)
[facility] PROGRESS NOTE  Margaret [name] NFA:[id number] DOB: [birth year] DOA: 08/09/2012 PCP: Michele Mcalpine, MD  Assessment/Plan: 1. UTI (kleb PNA)- rocephin day #2 2. Hyponatremia- decrease IVF now that patient is eating, fluid restrict 3. AMS due to UTI- improved, MRI negative for CVA 4. Dementia- close to baseline 5. Dysphagia- seen by speech, diet ordered- family aware of known aspiration risks 6. Fever- BC x2 ordered 7. R/o CVA- MRI negative 8. PT/OT- ?SNF vs ALF vs family- case management/social worker consulted  Code Status: DNR Family Communication: daughter at bedside Disposition Plan: ?   Consultants:    Procedures:    Antibiotics:  rocephin  HPI/Subjective: Feeling better Up in chair Slow to respond to questions  Objective: Filed Vitals:   08/10/12 2205 08/11/12 0251 08/11/12 0706 08/11/12 0937  BP: 126/51 143/60 113/55 127/58  Pulse: 104 87 88 84  Temp: 98.3 F (36.8 C) 98.2 F (36.8 C) 98.3 F (36.8 C) 98.1 F (36.7 C)  TempSrc: Axillary Axillary Oral Oral  Resp: 16 16 16 16   Height:      Weight:      SpO2: 96% 98% 98% 98%    Intake/Output Summary (Last 24 hours) at 08/11/12 1042 Last data filed at 08/11/12 0600  Gross per 24 hour  Intake    825 ml  Output    300 ml  Net    525 ml   Filed Weights   08/10/12 1443  Weight: 53.3 kg (117 lb 8.1 oz)    Exam:   General:  Pleasant/cooperative  Cardiovascular: rrr  Respiratory: clear anterior  Abdomen: +BS, soft, NT/ND  Data Reviewed: Basic Metabolic Panel:  Lab 08/11/12 4696 08/10/12 1701 08/10/12 1042 08/09/12 2245  NA 124* 126* 126* 121*  K 3.4* 3.5 3.7 4.0  CL 89* 89* 88* 86*  CO2 24 24 24 25   GLUCOSE 112* 86 94 114*  BUN 17 13 9 12   CREATININE 0.87 0.72 0.57 0.52  CALCIUM 9.1 9.0 9.0 8.9  MG -- -- -- --  PHOS -- -- -- --   Liver Function Tests:  Lab 08/09/12 2245  AST 31  ALT 15  ALKPHOS 75  BILITOT 0.6  PROT 7.2  ALBUMIN 3.9   No results found for  this basename: LIPASE:5,AMYLASE:5 in the last 168 hours No results found for this basename: AMMONIA:5 in the last 168 hours CBC:  Lab 08/09/12 2245  WBC 5.8  NEUTROABS 3.1  HGB 10.5*  HCT 28.8*  MCV 83.7  PLT 218   Cardiac Enzymes:  Lab 08/10/12 1701 08/10/12 1035  CKTOTAL -- --  CKMB -- --  CKMBINDEX -- --  TROPONINI 0.32* <0.30   BNP (last 3 results) No results found for this basename: PROBNP:3 in the last 8760 hours CBG:  Lab 08/11/12 0423 08/10/12 2203 08/10/12 1758  GLUCAP 119* 108* 86    Recent Results (from the past 240 hour(s))  URINE CULTURE     Status: Normal   Collection Time   08/10/12 12:06 AM      Component Value Range Status Comment   Specimen Description URINE, CATHETERIZED   Final    Special Requests NONE   Final    Culture  Setup Time 08/10/2012 11:05   Final    Colony Count >=100,000 COLONIES/ML   Final    Culture KLEBSIELLA PNEUMONIAE   Final    Report Status 08/11/2012 FINAL   Final    Organism ID, Bacteria KLEBSIELLA PNEUMONIAE   Final   CULTURE, BLOOD (  ROUTINE X 2)     Status: Normal (Preliminary result)   Collection Time   08/10/12  2:15 PM      Component Value Range Status Comment   Specimen Description BLOOD RIGHT ARM   Final    Special Requests BOTTLES DRAWN AEROBIC AND ANAEROBIC 10CC   Final    Culture  Setup Time 08/10/2012 18:44   Final    Culture     Final    Value:        BLOOD CULTURE RECEIVED NO GROWTH TO DATE CULTURE WILL BE HELD FOR 5 DAYS BEFORE ISSUING A FINAL NEGATIVE REPORT   Report Status PENDING   Incomplete   CULTURE, BLOOD (ROUTINE X 2)     Status: Normal (Preliminary result)   Collection Time   08/10/12  2:25 PM      Component Value Range Status Comment   Specimen Description BLOOD RIGHT ARM   Final    Special Requests BOTTLES DRAWN AEROBIC AND ANAEROBIC 10CC   Final    Culture  Setup Time 08/10/2012 18:44   Final    Culture     Final    Value:        BLOOD CULTURE RECEIVED NO GROWTH TO DATE CULTURE WILL BE HELD  FOR 5 DAYS BEFORE ISSUING A FINAL NEGATIVE REPORT   Report Status PENDING   Incomplete      Studies: Dg Chest 2 View  08/10/2012  *RADIOLOGY REPORT*  Clinical Data: Cerebrovascular accident.  CHEST - 2 VIEW  Comparison: April 17, 2012.  Findings: Cardiomediastinal silhouette appears normal.  No acute pulmonary disease is noted.  Bony thorax is intact.  IMPRESSION: No acute cardiopulmonary abnormality seen.   Original Report Authenticated By: Lupita Raider.,  M.D.    Ct Head Wo Contrast  08/10/2012  *RADIOLOGY REPORT*  Clinical Data: Altered level of consciousness.  CT HEAD WITHOUT CONTRAST  Technique:  Contiguous axial images were obtained from the base of the skull through the vertex without contrast.  Comparison: CT of the head performed 11/20/2007, and MRI of the brain performed 09/02/2011  Findings: Vaguely decreased attenuation at the inferior left frontal lobe may reflect a small infarct of indeterminate age. There is no evidence of mass lesion, or intra- or extra-axial hemorrhage on CT.  Diffuse periventricular and subcortical white matter change reflects small vessel ischemic microangiopathy.  Chronic ischemic change is noted at the external capsule bilaterally.  The posterior fossa, including the cerebellum, brainstem and fourth ventricle, is within normal limits.  The third and lateral ventricles, and basal ganglia are unremarkable in appearance.  No mass effect or midline shift is seen.  There is no evidence of fracture; visualized osseous structures are unremarkable in appearance.  The orbits are within normal limits. There is mild partial opacification of the left maxillary sinus; the remaining paranasal sinuses and mastoid air cells are well- aerated.  No significant soft tissue abnormalities are seen.  IMPRESSION:  1.  Vaguely decreased attenuation at the inferior left frontal lobe may reflect a small infarct of indeterminate age.  No evidence of hemorrhagic transformation. 2.  Diffuse  small vessel ischemic microangiopathy and chronic ischemic change at the external capsule bilaterally. 3.  Mild partial opacification of the left maxillary sinus.   Original Report Authenticated By: Tonia Ghent, M.D.    Mr Brain Wo Contrast  08/10/2012  *RADIOLOGY REPORT*  Clinical Data:  Stroke.  Confusion.  Abnormal head CT.  MRI HEAD WITHOUT CONTRAST MRA HEAD WITHOUT CONTRAST  Technique:  Multiplanar, multiecho pulse sequences of the brain and surrounding structures were obtained without intravenous contrast. Angiographic images of the head were obtained using MRA technique without contrast.  Comparison:  Head CT same day.  Are 09/02/2011.  MRI HEAD  Findings:  Diffusion imaging does not show any acute or subacute infarction.  There are chronic small vessel changes affecting the brainstem and cerebellum.  The cerebral hemispheres show atrophy with advanced chronic small vessel changes throughout the deep and subcortical white matter. No large vessel territory infarction.  No mass lesion, hemorrhage, hydrocephalus or extra-axial collection. No pituitary mass.  There is some mucosal inflammatory changes of the sinuses, particularly the left maxillary sinus.  IMPRESSION: No acute finding.  Advanced widespread chronic small vessel disease throughout the brain.  MRA HEAD  Findings: Both internal carotid arteries are widely patent into the brain.  The anterior and middle cerebral vessels are patent without proximal stenosis, aneurysm or vascular malformation.  Distal branch vessels show atherosclerotic narrowing and irregularity.  The right vertebral artery terminates in pica.  The left vertebral arteries patent to the basilar.  No basilar stenosis.  Posterior circulation branch vessels are patent.  Distal vessel atherosclerotic changes seen in the PCA territories.  IMPRESSION: No major vessel occlusion or correctable proximal stenosis.  Distal vessel atherosclerotic irregularity.   Original Report Authenticated  By: Paulina Fusi, M.D.    Mr Mra Head/brain Wo Cm  08/10/2012  *RADIOLOGY REPORT*  Clinical Data:  Stroke.  Confusion.  Abnormal head CT.  MRI HEAD WITHOUT CONTRAST MRA HEAD WITHOUT CONTRAST  Technique:  Multiplanar, multiecho pulse sequences of the brain and surrounding structures were obtained without intravenous contrast. Angiographic images of the head were obtained using MRA technique without contrast.  Comparison:  Head CT same day.  Are 09/02/2011.  MRI HEAD  Findings:  Diffusion imaging does not show any acute or subacute infarction.  There are chronic small vessel changes affecting the brainstem and cerebellum.  The cerebral hemispheres show atrophy with advanced chronic small vessel changes throughout the deep and subcortical white matter. No large vessel territory infarction.  No mass lesion, hemorrhage, hydrocephalus or extra-axial collection. No pituitary mass.  There is some mucosal inflammatory changes of the sinuses, particularly the left maxillary sinus.  IMPRESSION: No acute finding.  Advanced widespread chronic small vessel disease throughout the brain.  MRA HEAD  Findings: Both internal carotid arteries are widely patent into the brain.  The anterior and middle cerebral vessels are patent without proximal stenosis, aneurysm or vascular malformation.  Distal branch vessels show atherosclerotic narrowing and irregularity.  The right vertebral artery terminates in pica.  The left vertebral arteries patent to the basilar.  No basilar stenosis.  Posterior circulation branch vessels are patent.  Distal vessel atherosclerotic changes seen in the PCA territories.  IMPRESSION: No major vessel occlusion or correctable proximal stenosis.  Distal vessel atherosclerotic irregularity.   Original Report Authenticated By: Paulina Fusi, M.D.     Scheduled Meds:   . aspirin  81 mg Oral Daily  . cefTRIAXone (ROCEPHIN) IVPB 1 gram/50 mL D5W  1 g Intravenous Q24H  . ciprofloxacin  1 drop Both Eyes Q4H while  awake  . feeding supplement  237 mL Oral Daily  . levothyroxine  75 mcg Oral QAC breakfast  . pantoprazole  40 mg Oral Daily  . phenazopyridine  200 mg Oral TID WC  . pindolol  5 mg Oral BID  . polyethylene glycol  17 g Oral Daily  .  senna-docusate  1 tablet Oral BID   Continuous Infusions:   . dextrose 5 % and 0.9% NaCl 75 mL/hr at 08/11/12 0600    Active Problems:  ANXIETY  CONSTIPATION  UTI (lower urinary tract infection)  Hyponatremia  CVA (cerebral infarction)  Confusion    Time spent: 35    [facility], Averi Kilty  [facility] Pager [phone]. If 8PM-8AM, please contact night-coverage at [email or web address], password [facility] 08/11/2012, 10:42 AM  LOS: 2 days

## 2012-08-11 NOTE — Progress Notes (Signed)
  Echocardiogram 2D Echocardiogram has been performed.  Ellender Hose A 08/11/2012, 12:35 PM

## 2012-08-12 DIAGNOSIS — R109 Unspecified abdominal pain: Secondary | ICD-10-CM

## 2012-08-12 LAB — CREATININE, URINE, RANDOM: Creatinine, Urine: 98.94 mg/dL

## 2012-08-12 LAB — CBC
MCH: 30.8 pg (ref 26.0–34.0)
MCV: 87.3 fL (ref 78.0–100.0)
Platelets: 129 10*3/uL — ABNORMAL LOW (ref 150–400)
RBC: 2.92 MIL/uL — ABNORMAL LOW (ref 3.87–5.11)
RDW: 14.3 % (ref 11.5–15.5)
WBC: 4.1 10*3/uL (ref 4.0–10.5)

## 2012-08-12 LAB — BASIC METABOLIC PANEL
CO2: 25 mEq/L (ref 19–32)
Calcium: 8.9 mg/dL (ref 8.4–10.5)
Creatinine, Ser: 0.62 mg/dL (ref 0.50–1.10)
GFR calc non Af Amer: 79 mL/min — ABNORMAL LOW (ref >90)
Glucose, Bld: 128 mg/dL — ABNORMAL HIGH (ref 70–99)
Sodium: 127 mEq/L — ABNORMAL LOW (ref 135–145)

## 2012-08-12 LAB — SODIUM, URINE, RANDOM: Sodium, Ur: 32 mEq/L

## 2012-08-12 NOTE — Progress Notes (Signed)
Physical Therapy Treatment Patient Details Name: Margaret [name] MRN: [id number] DOB: 1923-11-03 Today's Date: 08/12/2012 Time: 6578-4696 PT Time Calculation (min): 15 min  PT Assessment / Plan / Recommendation Comments on Treatment Session  Pt cont's to make progress with mobility + PT goals at this date however still recommend ST-SNF due to pt not at mod (I) level with mobility.         Follow Up Recommendations  SNF;Supervision/Assistance - 24 hour     Does the patient have the potential to tolerate intense rehabilitation     Barriers to Discharge        Equipment Recommendations  Other (comment) (TBD)    Recommendations for Other Services    Frequency Min 4X/week   Plan Discharge plan remains appropriate    Precautions / Restrictions Precautions Precautions: Fall Restrictions Weight Bearing Restrictions: No       Mobility  Bed Mobility Bed Mobility: Not assessed Details for Bed Mobility Assistance: Pt sitting EOB upon arrival.   Transfers Transfers: Sit to Stand;Stand to Sit Sit to Stand: 4: Min guard;With upper extremity assist;From bed Stand to Sit: 4: Min guard;With upper extremity assist;With armrests;To chair/3-in-1 Details for Transfer Assistance: Cues for safest hand placement.   Ambulation/Gait Ambulation/Gait Assistance: 4: Min guard Ambulation Distance (Feet): 120 Feet Assistive device: Rolling walker Ambulation/Gait Assistance Details: Guarding for safety.  Cues for increased awareness of obstacles in environment.   Gait Pattern: Step-through pattern;Decreased stride length (decreased step height. ) Modified Rankin (Stroke Patients Only) Pre-Morbid Rankin Score: No symptoms Modified Rankin: Moderately severe disability     PT Goals Acute Rehab PT Goals Potential to Achieve Goals: Fair Pt will go Supine/Side to Sit: with min assist Pt will go Sit to Supine/Side: with min assist Pt will go Sit to Stand: with modified independence PT Goal: Sit to  Stand - Progress: Progressing toward goal Pt will go Stand to Sit: with modified independence PT Goal: Stand to Sit - Progress: Progressing toward goal Pt will Transfer Bed to Chair/Chair to Bed: with modified independence Pt will Ambulate: with modified independence;51 - 150 feet;with least restrictive assistive device PT Goal: Ambulate - Progress: Progressing toward goal  Visit Information  Last PT Received On: 08/12/12 Assistance Needed: +1    Subjective Data      Cognition  Overall Cognitive Status: Impaired Area of Impairment: Memory Arousal/Alertness: Awake/alert Orientation Level: Disoriented to;Place;Time;Situation Behavior During Session: [facility] for tasks performed Memory Deficits: Pt unable to recall reason for admittance to hospital.      Balance     End of Session PT - End of Session Equipment Utilized During Treatment: Gait belt Activity Tolerance: Patient tolerated treatment well Patient left: in chair;with call bell/phone within reach Nurse Communication: Mobility status     Verdell Face, [place] [phone] 08/12/2012

## 2012-08-12 NOTE — Clinical Documentation Improvement (Signed)
MENTAL STATUS DOCUMENTATION CLARIFICATION   THIS DOCUMENT IS NOT A PERMANENT PART OF THE MEDICAL RECORD         08/12/12  Dear Dr. Benjamine Mola,  In an effort to better capture your patient's severity of illness, reflect appropriate length of stay and utilization of resources, a review of the patient medical record has revealed the following indicators.   Based on your clinical judgment, please clarify and document in a progress note and/or discharge summary the clinical condition associated with the following supporting information: In responding to this query please exercise your independent judgment.  The fact that a query is asked, does not imply that any particular answer is desired or expected.    Markell was admitted with UTI, hyponatremia and AMS due to UTI. If possible, please help clarify the suspected diagnosis "AMS". Thank you!  Possible Clinical Conditions?  - Acute Metabolic Encephalopathy      - Acute _______ Encephalopathy                     - Other condition (please document in the progress notes and/or discharge summary)  - Cannot Clinically determine at this time   Supporting Information:  - "General: patient is actively combative"; "Neurologically Grossly intact, moving all 4 extremities equally but unable to cooperate with exam" H&P                   Reviewed: additional documentation in the medical record    Thank You,  Saul Fordyce  Clinical Documentation Specialist: [phone] Pager  Health Information Management [place]

## 2012-08-12 NOTE — Progress Notes (Signed)
Occupational Therapy Treatment Patient Details Name: Margaret [name] MRN: [id number] DOB: [birth year] Today's Date: 08/12/2012 Time: 1013-1027 OT Time Calculation (min): 14 min  OT Assessment / Plan / Recommendation    Follow Up Recommendations  SNF       Equipment Recommendations  None recommended by OT       Frequency Min 2X/week   Plan Discharge plan remains appropriate    Precautions / Restrictions Precautions Precautions: Fall Restrictions Weight Bearing Restrictions: No       ADL  Grooming: Performed;Wash/dry hands;Minimal assistance Where Assessed - Grooming: Supported Copywriter, advertising: Performed;Minimal Dentist Method: Sit to Barista: Comfort height toilet Toileting - Architect and Hygiene: Performed;Moderate assistance Where Assessed - Toileting Clothing Manipulation and Hygiene: Standing       OT Goals ADL Goals ADL Goal: Grooming - Progress: Progressing toward goals Pt Will Transfer to Toilet: with min assist ADL Goal: Toilet Transfer - Progress: Goal set today Pt Will Perform Toileting - Clothing Manipulation: with supervision ADL Goal: Toileting - Clothing Manipulation - Progress: Goal set today Miscellaneous OT Goals OT Goal: Miscellaneous Goal #1 - Progress: Met OT Goal: Miscellaneous Goal #2 - Progress: Met  Visit Information  Last OT Received On: 08/12/12 Assistance Needed: +1          Cognition  Overall Cognitive Status: Impaired Area of Impairment: Memory Arousal/Alertness: Awake/alert Orientation Level: Disoriented to;Place;Time;Situation Behavior During Session: WFL for tasks performed Memory Deficits: Pt unable to recall reason for admittance to hospital.      Mobility  Shoulder Instructions Bed Mobility Bed Mobility: Supine to Sit Rolling Right: 4: Min assist Details for Bed Mobility Assistance: Pt sitting EOB upon arrival.   Transfers Transfers: Sit to Stand;Stand  to Sit Sit to Stand: 4: Min assist;With upper extremity assist;From toilet;From bed Stand to Sit: 4: Min assist;With upper extremity assist;To toilet;To bed Details for Transfer Assistance: Cues for safest hand placement.               End of Session OT - End of Session Activity Tolerance: Patient tolerated treatment well Patient left: in bed  GO     Helane Briceno, [place] 08/12/2012, 10:43 AM

## 2012-08-12 NOTE — Progress Notes (Addendum)
[facility] PROGRESS NOTE  Margaret [name] WUJ:[id number] DOB: 1923/11/19 DOA: 08/09/2012 PCP: Michele Mcalpine, MD  Assessment/Plan: 1. UTI (kleb PNA)- rocephin day #3- pan sensitive- can be changed to oral abx at d/c 2. Hyponatremia- decrease IVF now that patient is eating, fluid restrict 3. AMS due to UTI (acute metabolic encephalopathy)- improved, MRI negative for CVA 4. Dementia- close to baseline 5. Dysphagia- seen by speech, diet ordered- family aware of known aspiration risks 6. Fever- BC x2 ordered- no fever in last 24 hour 7. R/o CVA- MRI negative 8. PT/OT- ?SNF vs ALF vs family- case management/social worker consulted  Code Status: DNR Family Communication: patient at bedside Disposition Plan: ?   Consultants:    Procedures:    Antibiotics:  rocephin  HPI/Subjective: Says she is doing better No fever Eating well  Objective: Filed Vitals:   08/11/12 1441 08/11/12 2200 08/12/12 0200 08/12/12 0600  BP: 132/59 144/57 136/52 137/60  Pulse: 86 77 66 66  Temp: 98 F (36.7 C) 97.7 F (36.5 C) 97.7 F (36.5 C) 97.4 F (36.3 C)  TempSrc: Oral Oral Oral Oral  Resp: 16 16 16 16   Height:      Weight:      SpO2: 97% 99% 100% 99%    Intake/Output Summary (Last 24 hours) at 08/12/12 0836 Last data filed at 08/11/12 1700  Gross per 24 hour  Intake    240 ml  Output    600 ml  Net   -360 ml   Filed Weights   08/10/12 1443  Weight: 53.3 kg (117 lb 8.1 oz)    Exam:   General:  Pleasant/cooperative  Cardiovascular: rrr  Respiratory: clear anterior  Abdomen: +BS, soft, NT/ND  Data Reviewed: Basic Metabolic Panel:  Lab 08/12/12 9562 08/11/12 0051 08/10/12 1701 08/10/12 1042 08/09/12 2245  NA 127* 124* 126* 126* 121*  K 3.5 3.4* 3.5 3.7 4.0  CL 96 89* 89* 88* 86*  CO2 25 24 24 24 25   GLUCOSE 128* 112* 86 94 114*  BUN 14 17 13 9 12   CREATININE 0.62 0.87 0.72 0.57 0.52  CALCIUM 8.9 9.1 9.0 9.0 8.9  MG -- -- -- -- --  PHOS -- -- -- -- --    Liver Function Tests:  Lab 08/09/12 2245  AST 31  ALT 15  ALKPHOS 75  BILITOT 0.6  PROT 7.2  ALBUMIN 3.9   No results found for this basename: LIPASE:5,AMYLASE:5 in the last 168 hours No results found for this basename: AMMONIA:5 in the last 168 hours CBC:  Lab 08/12/12 0645 08/09/12 2245  WBC 4.1 5.8  NEUTROABS -- 3.1  HGB 9.0* 10.5*  HCT 25.5* 28.8*  MCV 87.3 83.7  PLT 129* 218   Cardiac Enzymes:  Lab 08/10/12 1701 08/10/12 1035  CKTOTAL -- --  CKMB -- --  CKMBINDEX -- --  TROPONINI 0.32* <0.30   BNP (last 3 results) No results found for this basename: PROBNP:3 in the last 8760 hours CBG:  Lab 08/11/12 1623 08/11/12 1128 08/11/12 0805 08/11/12 0423 08/10/12 2203  GLUCAP 165* 127* 116* 119* 108*    Recent Results (from the past 240 hour(s))  URINE CULTURE     Status: Normal   Collection Time   08/10/12 12:06 AM      Component Value Range Status Comment   Specimen Description URINE, CATHETERIZED   Final    Special Requests NONE   Final    Culture  Setup Time 08/10/2012 11:05   Final  Colony Count >=100,000 COLONIES/ML   Final    Culture KLEBSIELLA PNEUMONIAE   Final    Report Status 08/11/2012 FINAL   Final    Organism ID, Bacteria KLEBSIELLA PNEUMONIAE   Final   CULTURE, BLOOD (ROUTINE X 2)     Status: Normal (Preliminary result)   Collection Time   08/10/12  2:15 PM      Component Value Range Status Comment   Specimen Description BLOOD RIGHT ARM   Final    Special Requests BOTTLES DRAWN AEROBIC AND ANAEROBIC 10CC   Final    Culture  Setup Time 08/10/2012 18:44   Final    Culture     Final    Value:        BLOOD CULTURE RECEIVED NO GROWTH TO DATE CULTURE WILL BE HELD FOR 5 DAYS BEFORE ISSUING A FINAL NEGATIVE REPORT   Report Status PENDING   Incomplete   CULTURE, BLOOD (ROUTINE X 2)     Status: Normal (Preliminary result)   Collection Time   08/10/12  2:25 PM      Component Value Range Status Comment   Specimen Description BLOOD RIGHT ARM   Final     Special Requests BOTTLES DRAWN AEROBIC AND ANAEROBIC 10CC   Final    Culture  Setup Time 08/10/2012 18:44   Final    Culture     Final    Value:        BLOOD CULTURE RECEIVED NO GROWTH TO DATE CULTURE WILL BE HELD FOR 5 DAYS BEFORE ISSUING A FINAL NEGATIVE REPORT   Report Status PENDING   Incomplete      Studies: Dg Chest 2 View  08/10/2012  *RADIOLOGY REPORT*  Clinical Data: Cerebrovascular accident.  CHEST - 2 VIEW  Comparison: April 17, 2012.  Findings: Cardiomediastinal silhouette appears normal.  No acute pulmonary disease is noted.  Bony thorax is intact.  IMPRESSION: No acute cardiopulmonary abnormality seen.   Original Report Authenticated By: Lupita Raider.,  M.D.    Mr Brain Wo Contrast  08/10/2012  *RADIOLOGY REPORT*  Clinical Data:  Stroke.  Confusion.  Abnormal head CT.  MRI HEAD WITHOUT CONTRAST MRA HEAD WITHOUT CONTRAST  Technique:  Multiplanar, multiecho pulse sequences of the brain and surrounding structures were obtained without intravenous contrast. Angiographic images of the head were obtained using MRA technique without contrast.  Comparison:  Head CT same day.  Are 09/02/2011.  MRI HEAD  Findings:  Diffusion imaging does not show any acute or subacute infarction.  There are chronic small vessel changes affecting the brainstem and cerebellum.  The cerebral hemispheres show atrophy with advanced chronic small vessel changes throughout the deep and subcortical white matter. No large vessel territory infarction.  No mass lesion, hemorrhage, hydrocephalus or extra-axial collection. No pituitary mass.  There is some mucosal inflammatory changes of the sinuses, particularly the left maxillary sinus.  IMPRESSION: No acute finding.  Advanced widespread chronic small vessel disease throughout the brain.  MRA HEAD  Findings: Both internal carotid arteries are widely patent into the brain.  The anterior and middle cerebral vessels are patent without proximal stenosis, aneurysm or  vascular malformation.  Distal branch vessels show atherosclerotic narrowing and irregularity.  The right vertebral artery terminates in pica.  The left vertebral arteries patent to the basilar.  No basilar stenosis.  Posterior circulation branch vessels are patent.  Distal vessel atherosclerotic changes seen in the PCA territories.  IMPRESSION: No major vessel occlusion or correctable proximal stenosis.  Distal vessel  atherosclerotic irregularity.   Original Report Authenticated By: Paulina Fusi, M.D.    Mr Mra Head/brain Wo Cm  08/10/2012  *RADIOLOGY REPORT*  Clinical Data:  Stroke.  Confusion.  Abnormal head CT.  MRI HEAD WITHOUT CONTRAST MRA HEAD WITHOUT CONTRAST  Technique:  Multiplanar, multiecho pulse sequences of the brain and surrounding structures were obtained without intravenous contrast. Angiographic images of the head were obtained using MRA technique without contrast.  Comparison:  Head CT same day.  Are 09/02/2011.  MRI HEAD  Findings:  Diffusion imaging does not show any acute or subacute infarction.  There are chronic small vessel changes affecting the brainstem and cerebellum.  The cerebral hemispheres show atrophy with advanced chronic small vessel changes throughout the deep and subcortical white matter. No large vessel territory infarction.  No mass lesion, hemorrhage, hydrocephalus or extra-axial collection. No pituitary mass.  There is some mucosal inflammatory changes of the sinuses, particularly the left maxillary sinus.  IMPRESSION: No acute finding.  Advanced widespread chronic small vessel disease throughout the brain.  MRA HEAD  Findings: Both internal carotid arteries are widely patent into the brain.  The anterior and middle cerebral vessels are patent without proximal stenosis, aneurysm or vascular malformation.  Distal branch vessels show atherosclerotic narrowing and irregularity.  The right vertebral artery terminates in pica.  The left vertebral arteries patent to the basilar.   No basilar stenosis.  Posterior circulation branch vessels are patent.  Distal vessel atherosclerotic changes seen in the PCA territories.  IMPRESSION: No major vessel occlusion or correctable proximal stenosis.  Distal vessel atherosclerotic irregularity.   Original Report Authenticated By: Paulina Fusi, M.D.     Scheduled Meds:    . aspirin  81 mg Oral Daily  . cefTRIAXone (ROCEPHIN) IVPB 1 gram/50 mL D5W  1 g Intravenous Q24H  . ciprofloxacin  1 drop Both Eyes Q4H while awake  . feeding supplement  237 mL Oral Daily  . levothyroxine  75 mcg Oral QAC breakfast  . pantoprazole  40 mg Oral Daily  . phenazopyridine  200 mg Oral TID WC  . pindolol  5 mg Oral BID  . polyethylene glycol  17 g Oral Daily  . senna-docusate  1 tablet Oral BID   Continuous Infusions:    . dextrose 5 % and 0.9% NaCl 75 mL/hr at 08/12/12 1610    Active Problems:  ANXIETY  CONSTIPATION  UTI (lower urinary tract infection)  Hyponatremia  CVA (cerebral infarction)  Confusion    Time spent: 35    [facility], Brandii Lakey  [facility] Pager [phone]. If 8PM-8AM, please contact night-coverage at [email or web address], password [facility] 08/12/2012, 8:36 AM  LOS: 3 days

## 2012-08-12 NOTE — Progress Notes (Signed)
Speech Language Pathology Dysphagia Treatment Patient Details Name: Margaret [name] MRN: [id number] DOB: [birth year] Today's Date: 08/12/2012 Time: 1130-1200 SLP Time Calculation (min): 30 min  Assessment / Plan / Recommendation Clinical Impression  Treatment focused on f/u diet tolerance assessment and patient/family education. As during initial evaluation, diagnostic treatment reveals a continued multifactorial dysphagia with both pharyngeal and esophageal components as characterized by little-no hyo-laryngeal elevation/excursion, multiple swallows upon palpation, significant belching  post swallow, and frequent coughing/throat clearing indicative of decreased airway protection. SLP provided verbal, visual, and tactile cueing and assistant for use of strategies including fully upright posture, small bites/sips, and following solids with liquids during self feeding. Despite severity of difficulty, vocal quality remains clear post cough hopefully suggestive of compensation for aspiration. Current function coincides with results of MBS 1 year ago and given extent of esophageal history, dysphagia likely chronic at this time with relatively high aspiration risk with all pos. Discussed with patient and daughter. [name]) reported understanding of education including compensatory strategies and diet recommendations and acknowledged understanding of aspiration risk, wishing to continue with a po diet. With any decline in health, pt likely to feel the impact of this severe dysphagia. Decision made to downgrade to dysphagia 2 with thin liquids as patient eat very little bread or meat at home, relying mostly on very soft cooked vegetables and pureed solids. Contacted dietician to assist in appropriate dietary choices given new restrictions. No further acute SLP f/u indicated at this time. SLP signing off. Please reconsult as needed.     Diet Recommendation  Initiate / Change Diet: Dysphagia 2 (fine  chop);Thin liquid    SLP Plan All goals met   Pertinent Vitals/Pain n/a   Swallowing Goals  SLP Swallowing Goals Patient will utilize recommended strategies during swallow to increase swallowing safety with: Moderate cueing Swallow Study Goal #2 - Progress: Met  General Temperature Spikes Noted: No Respiratory Status: Room air Behavior/Cognition: Alert;Cooperative;Pleasant mood;Requires cueing Oral Cavity - Dentition: Poor condition Patient Positioning: Upright in bed       Dysphagia Treatment Treatment focused on: Skilled observation of diet tolerance;Patient/family/caregiver education;Utilization of compensatory strategies Family/Caregiver Educated: daughter, Margaret [name], via phone Treatment Methods/Modalities: Skilled observation;Differential diagnosis Patient observed directly with PO's: Yes Type of PO's observed: Dysphagia 1 (puree);Dysphagia 3 (soft);Thin liquids Feeding: Able to feed self Liquids provided via: Straw Pharyngeal Phase Signs & Symptoms: Suspected delayed swallow initiation;Multiple swallows;Wet vocal quality;Immediate throat clear;Immediate cough (poor hyo-laryngeal elevation and excursion) Type of cueing: Verbal Amount of cueing: Moderate   GO    Margaret Lango MA, CCC-SLP [phone]  Margaret [name] Margaret [name] 08/12/2012, 3:01 PM

## 2012-08-13 DIAGNOSIS — E039 Hypothyroidism, unspecified: Secondary | ICD-10-CM

## 2012-08-13 DIAGNOSIS — R1310 Dysphagia, unspecified: Secondary | ICD-10-CM

## 2012-08-13 LAB — BASIC METABOLIC PANEL
BUN: 7 mg/dL (ref 6–23)
Calcium: 9.3 mg/dL (ref 8.4–10.5)
GFR calc Af Amer: >90 mL/min (ref >90)
GFR calc non Af Amer: 89 mL/min — ABNORMAL LOW (ref >90)
Glucose, Bld: 125 mg/dL — ABNORMAL HIGH (ref 70–99)

## 2012-08-13 NOTE — Progress Notes (Signed)
[facility] PROGRESS NOTE  [name] WUJ:[id number] DOB: 1923-10-31 DOA: 08/09/2012 PCP: Margaret Mcalpine, MD  Assessment/Plan: 1. UTI (kleb PNA)- rocephin day #4- pan sensitive- can be changed to oral abx at d/c 2. Hyponatremia- decrease IVF now that patient is eating, fluid restrict, just about resolved 3. AMS due to UTI (acute metabolic encephalopathy)- improved, MRI negative for CVA 4. Dementia- close to baseline 5. Dysphagia- seen by speech, diet ordered- family aware of known aspiration risks 6. Fever- BC x2 ordered- no fever in last 24 hour 7. R/o CVA- MRI negative PT/OT- ?SNF vs ALF.   Code Status: DNR Family Communication: Left messages family 8. Disposition Plan: Given the patient's improvement, family once discharge back independent living with for home health services. Case management setting up.    Consultants:  None  Procedures:  None  Antibiotics: Rocephin D4 HPI/Subjective: Patient herself has no complaints. She is fatigued. No pain. Eating comfortably.  Objective: Filed Vitals:   08/13/12 1007 08/13/12 1034 08/13/12 1112 08/13/12 1455  BP:  187/82 152/64 145/52  Pulse: 77  75 79  Temp: 97.7 F (36.5 C)   97.8 F (36.6 C)  TempSrc: Oral   Oral  Resp: 20   20  Height:      Weight:      SpO2: 99%   98%    Intake/Output Summary (Last 24 hours) at 08/13/12 1519 Last data filed at 08/13/12 0954  Gross per 24 hour  Intake    600 ml  Output   2975 ml  Net  -2375 ml   Filed Weights   08/10/12 1443  Weight: 53.3 kg (117 lb 8.1 oz)    Exam:   General:  No acute distress, alert and oriented x2  Cardiovascular: Regular rate and rhythm, S1-S2 occasional ectopic beat  Respiratory: clear anterior  Abdomen: +BS, soft, NT/ND  Extremities: No edema  Data Reviewed: Basic Metabolic Panel:  Lab 08/13/12 9562 08/12/12 0645 08/11/12 0051 08/10/12 1701 08/10/12 1042  NA 131* 127* 124* 126* 126*  K 3.9 3.5 3.4* 3.5 3.7  CL 96 96 89* 89*  88*  CO2 24 25 24 24 24   GLUCOSE 125* 128* 112* 86 94  BUN 7 14 17 13 9   CREATININE 0.43* 0.62 0.87 0.72 0.57  CALCIUM 9.3 8.9 9.1 9.0 9.0  MG -- -- -- -- --  PHOS -- -- -- -- --   Liver Function Tests:  Lab 08/09/12 2245  AST 31  ALT 15  ALKPHOS 75  BILITOT 0.6  PROT 7.2  ALBUMIN 3.9   CBC:  Lab 08/12/12 0645 08/09/12 2245  WBC 4.1 5.8  NEUTROABS -- 3.1  HGB 9.0* 10.5*  HCT 25.5* 28.8*  MCV 87.3 83.7  PLT 129* 218   Cardiac Enzymes:  Lab 08/10/12 1701 08/10/12 1035  CKTOTAL -- --  CKMB -- --  CKMBINDEX -- --  TROPONINI 0.32* <0.30   CBG:  Lab 08/11/12 1623 08/11/12 1128 08/11/12 0805 08/11/12 0423 08/10/12 2203  GLUCAP 165* 127* 116* 119* 108*    Recent Results (from the past 240 hour(s))  URINE CULTURE     Status: Normal   Collection Time   08/10/12 12:06 AM      Component Value Range Status Comment   Specimen Description URINE, CATHETERIZED   Final    Special Requests NONE   Final    Culture  Setup Time 08/10/2012 11:05   Final    Colony Count >=100,000 COLONIES/ML   Final  Culture KLEBSIELLA PNEUMONIAE   Final    Report Status 08/11/2012 FINAL   Final    Organism ID, Bacteria KLEBSIELLA PNEUMONIAE   Final   CULTURE, BLOOD (ROUTINE X 2)     Status: Normal (Preliminary result)   Collection Time   08/10/12  2:15 PM      Component Value Range Status Comment   Specimen Description BLOOD RIGHT ARM   Final    Special Requests BOTTLES DRAWN AEROBIC AND ANAEROBIC 10CC   Final    Culture  Setup Time 08/10/2012 18:44   Final    Culture     Final    Value:        BLOOD CULTURE RECEIVED NO GROWTH TO DATE CULTURE WILL BE HELD FOR 5 DAYS BEFORE ISSUING A FINAL NEGATIVE REPORT   Report Status PENDING   Incomplete   CULTURE, BLOOD (ROUTINE X 2)     Status: Normal (Preliminary result)   Collection Time   08/10/12  2:25 PM      Component Value Range Status Comment   Specimen Description BLOOD RIGHT ARM   Final    Special Requests BOTTLES DRAWN AEROBIC AND  ANAEROBIC 10CC   Final    Culture  Setup Time 08/10/2012 18:44   Final    Culture     Final    Value:        BLOOD CULTURE RECEIVED NO GROWTH TO DATE CULTURE WILL BE HELD FOR 5 DAYS BEFORE ISSUING A FINAL NEGATIVE REPORT   Report Status PENDING   Incomplete      Studies: No results found.  Scheduled Meds:    . aspirin  81 mg Oral Daily  . cefTRIAXone (ROCEPHIN) IVPB 1 gram/50 mL D5W  1 g Intravenous Q24H  . ciprofloxacin  1 drop Both Eyes Q4H while awake  . feeding supplement  237 mL Oral Daily  . levothyroxine  75 mcg Oral QAC breakfast  . pantoprazole  40 mg Oral Daily  . pindolol  5 mg Oral BID  . polyethylene glycol  17 g Oral Daily  . senna-docusate  1 tablet Oral BID   Continuous Infusions:    . dextrose 5 % and 0.9% NaCl 50 mL/hr at 08/12/12 8295    Active Problems:  ANXIETY  CONSTIPATION  UTI (lower urinary tract infection)  Hyponatremia  CVA (cerebral infarction)  Confusion    Time spent: 20 minutes    Hollice Espy  [facility] Pager [phone] If 8PM-8AM, please contact night-coverage at [email or web address], password [facility] 08/13/2012, 3:19 PM  LOS: 4 days

## 2012-08-14 ENCOUNTER — Inpatient Hospital Stay (HOSPITAL_COMMUNITY): Payer: Medicare Other

## 2012-08-14 DIAGNOSIS — F411 Generalized anxiety disorder: Secondary | ICD-10-CM

## 2012-08-14 LAB — URINALYSIS, ROUTINE W REFLEX MICROSCOPIC
Glucose, UA: NEGATIVE mg/dL
Ketones, ur: NEGATIVE mg/dL
Nitrite: NEGATIVE
Protein, ur: 100 mg/dL — AB

## 2012-08-14 LAB — BLOOD GAS, ARTERIAL
Acid-Base Excess: 5.1 mmol/L — ABNORMAL HIGH (ref 0.0–2.0)
Drawn by: 257081
TCO2: 30.5 mmol/L (ref 0–100)
pCO2 arterial: 43.5 mmHg (ref 35.0–45.0)
pH, Arterial: 7.441 (ref 7.350–7.450)
pO2, Arterial: 71.1 mmHg — ABNORMAL LOW (ref 80.0–100.0)

## 2012-08-14 LAB — COMPREHENSIVE METABOLIC PANEL
ALT: 16 U/L (ref 0–35)
AST: 30 U/L (ref 0–37)
Calcium: 9.7 mg/dL (ref 8.4–10.5)
Sodium: 132 mEq/L — ABNORMAL LOW (ref 135–145)
Total Protein: 7.2 g/dL (ref 6.0–8.3)

## 2012-08-14 LAB — CBC
MCH: 30.4 pg (ref 26.0–34.0)
MCHC: 35 g/dL (ref 30.0–36.0)
Platelets: 177 10*3/uL (ref 150–400)
RDW: 14 % (ref 11.5–15.5)

## 2012-08-14 LAB — URINE MICROSCOPIC-ADD ON

## 2012-08-14 LAB — PRO B NATRIURETIC PEPTIDE: Pro B Natriuretic peptide (BNP): 1779 pg/mL — ABNORMAL HIGH (ref 0–450)

## 2012-08-14 MED ORDER — FUROSEMIDE 10 MG/ML IJ SOLN
20.0000 mg | Freq: Two times a day (BID) | INTRAMUSCULAR | Status: DC
  Administered 2012-08-14 – 2012-08-15 (×3): 20 mg via INTRAVENOUS
  Filled 2012-08-14 (×7): qty 2

## 2012-08-14 NOTE — Progress Notes (Signed)
[facility] PROGRESS NOTE  Margaret [name] AOZ:[id number] DOB: 03/23/1924 DOA: 08/09/2012 PCP: Michele Mcalpine, MD  Assessment/Plan: 1. Delirium: Unclear etiology, patient is quite confused and agitated.BMET and CBC normal. ABG shows mild hypoxia, but nothing severe. Have ordered CT scan of head, urinalysis and BNP of which are pending. 2. UTI (kleb PNA)- rocephin day #5, pansensitive. See above 3. Hyponatremia- decrease IVF now that patient is eating, fluid restrict, just about resolved 4. AMS due to UTI (acute metabolic encephalopathy)- improved, MRI negative for CVA. Now severe acute delirium see above 5. Dementia- close to baseline 6. Dysphagia- seen by speech, diet ordered- family aware of known aspiration risks 7. Fever- BC x2 ordered- no fever in last 24 hour 8. R/o CVA- MRI negative PT/OT- ?SNF vs ALF.   Code Status: DNR Family Communication:  9. Disposition Plan:     Consultants:  None  Procedures:  None  Antibiotics: Rocephin D5  HPI/Subjective: Patient very different from yesterday. Quite confused. Agitated.  Objective: Filed Vitals:   08/14/12 0212 08/14/12 0546 08/14/12 0942 08/14/12 1447  BP: 148/61 176/71 155/78 169/67  Pulse: 71 79 84 98  Temp: 97.4 F (36.3 C) 98 F (36.7 C) 98 F (36.7 C) 97.8 F (36.6 C)  TempSrc: Oral Oral    Resp:  18 20 20   Height:      Weight:      SpO2: 98% 95% 98% 98%    Intake/Output Summary (Last 24 hours) at 08/14/12 1637 Last data filed at 08/14/12 1026  Gross per 24 hour  Intake    360 ml  Output   4025 ml  Net  -3665 ml   Filed Weights   08/10/12 1443  Weight: 53.3 kg (117 lb 8.1 oz)    Exam:   General:  Easily frightened. Concerned about being time. Cannot voice why she is upset  Cardiovascular: Regular rate and rhythm, S1-S2 occasional ectopic beat  Respiratory: clear anterior  Abdomen: +BS, soft, NT/ND  Extremities: No edema  Data Reviewed: Basic Metabolic Panel:  Lab 08/14/12 9629  08/13/12 0725 08/12/12 0645 08/11/12 0051 08/10/12 1701  NA 132* 131* 127* 124* 126*  K 3.6 3.9 3.5 3.4* 3.5  CL 92* 96 96 89* 89*  CO2 30 24 25 24 24   GLUCOSE 121* 125* 128* 112* 86  BUN 5* 7 14 17 13   CREATININE 0.47* 0.43* 0.62 0.87 0.72  CALCIUM 9.7 9.3 8.9 9.1 9.0  MG -- -- -- -- --  PHOS -- -- -- -- --   Liver Function Tests:  Lab 08/14/12 1134 08/09/12 2245  AST 30 31  ALT 16 15  ALKPHOS 68 75  BILITOT 0.3 0.6  PROT 7.2 7.2  ALBUMIN 3.8 3.9   CBC:  Lab 08/14/12 1134 08/12/12 0645 08/09/12 2245  WBC 4.3 4.1 5.8  NEUTROABS -- -- 3.1  HGB 11.3* 9.0* 10.5*  HCT 32.3* 25.5* 28.8*  MCV 86.8 87.3 83.7  PLT 177 129* 218   Cardiac Enzymes:  Lab 08/10/12 1701 08/10/12 1035  CKTOTAL -- --  CKMB -- --  CKMBINDEX -- --  TROPONINI 0.32* <0.30   CBG:  Lab 08/11/12 1623 08/11/12 1128 08/11/12 0805 08/11/12 0423 08/10/12 2203  GLUCAP 165* 127* 116* 119* 108*    Recent Results (from the past 240 hour(s))  URINE CULTURE     Status: Normal   Collection Time   08/10/12 12:06 AM      Component Value Range Status Comment   Specimen Description URINE, CATHETERIZED  Final    Special Requests NONE   Final    Culture  Setup Time 08/10/2012 11:05   Final    Colony Count >=100,000 COLONIES/ML   Final    Culture KLEBSIELLA PNEUMONIAE   Final    Report Status 08/11/2012 FINAL   Final    Organism ID, Bacteria KLEBSIELLA PNEUMONIAE   Final   CULTURE, BLOOD (ROUTINE X 2)     Status: Normal (Preliminary result)   Collection Time   08/10/12  2:15 PM      Component Value Range Status Comment   Specimen Description BLOOD RIGHT ARM   Final    Special Requests BOTTLES DRAWN AEROBIC AND ANAEROBIC 10CC   Final    Culture  Setup Time 08/10/2012 18:44   Final    Culture     Final    Value:        BLOOD CULTURE RECEIVED NO GROWTH TO DATE CULTURE WILL BE HELD FOR 5 DAYS BEFORE ISSUING A FINAL NEGATIVE REPORT   Report Status PENDING   Incomplete   CULTURE, BLOOD (ROUTINE X 2)     Status:  Normal (Preliminary result)   Collection Time   08/10/12  2:25 PM      Component Value Range Status Comment   Specimen Description BLOOD RIGHT ARM   Final    Special Requests BOTTLES DRAWN AEROBIC AND ANAEROBIC 10CC   Final    Culture  Setup Time 08/10/2012 18:44   Final    Culture     Final    Value:        BLOOD CULTURE RECEIVED NO GROWTH TO DATE CULTURE WILL BE HELD FOR 5 DAYS BEFORE ISSUING A FINAL NEGATIVE REPORT   Report Status PENDING   Incomplete      Studies: Ct Head Wo Contrast  08/14/2012  *RADIOLOGY REPORT*  Clinical Data: 79 year old female with acute altered mental status.  CT HEAD WITHOUT CONTRAST  Technique:  Contiguous axial images were obtained from the base of the skull through the vertex without contrast.  Comparison: Brain MRI 08/10/2012 and earlier.  Findings:  Improved left maxillary sinus aeration.  Stable mild left sphenoid sinus mucosal thickening.  Other Visualized paranasal sinuses and mastoids are clear.  No acute osseous abnormality identified.  No acute orbit or scalp soft tissue findings.  Calcified atherosclerosis at the skull base.  No ventriculomegaly. No midline shift, mass effect, or evidence of mass lesion.  No acute intracranial hemorrhage identified.  Confluent white matter hypodensity is stable. No evidence of cortically based acute infarction identified.  Dominant distal left vertebral artery. No suspicious intracranial vascular hyperdensity.   IMPRESSION: Stable. No acute intracranial abnormality.   Original Report Authenticated By: Erskine Speed, M.D.     Scheduled Meds:    . aspirin  81 mg Oral Daily  . cefTRIAXone (ROCEPHIN) IVPB 1 gram/50 mL D5W  1 g Intravenous Q24H  . ciprofloxacin  1 drop Both Eyes Q4H while awake  . feeding supplement  237 mL Oral Daily  . levothyroxine  75 mcg Oral QAC breakfast  . pantoprazole  40 mg Oral Daily  . pindolol  5 mg Oral BID  . polyethylene glycol  17 g Oral Daily  . senna-docusate  1 tablet Oral BID    Continuous Infusions:    Active Problems:  ANXIETY  CONSTIPATION  UTI (lower urinary tract infection)  Hyponatremia  CVA (cerebral infarction)  Confusion    Time spent: 20 minutes    Chrisopher Pustejovsky K  [facility] Pager [phone] If 8PM-8AM, please contact night-coverage at [email or web address], password [facility] 08/14/2012, 4:37 PM  LOS: 5 days

## 2012-08-14 NOTE — Progress Notes (Signed)
Physical Therapy Treatment Patient Details Name: Margaret [name] MRN: [id number] DOB: 04/09/1924 Today's Date: 08/14/2012 Time: 2595-6387 PT Time Calculation (min): 23 min  PT Assessment / Plan / Recommendation Comments on Treatment Session  Pt appears to be more confused today.  Not oriented to place.  Having hallucinations during session.     Follow Up Recommendations  SNF;Supervision/Assistance - 24 hour     Does the patient have the potential to tolerate intense rehabilitation     Barriers to Discharge        Equipment Recommendations       Recommendations for Other Services    Frequency Min 4X/week   Plan Discharge plan remains appropriate    Precautions / Restrictions Precautions Precautions: Fall Restrictions Weight Bearing Restrictions: No       Mobility  Bed Mobility Bed Mobility: Supine to Sit;Sitting - Scoot to Edge of Bed Supine to Sit: 4: Min guard Sitting - Scoot to [place] of Bed: 5: Supervision Transfers Transfers: Sit to Stand;Stand to Sit Sit to Stand: 4: Min assist;With upper extremity assist;From bed;From chair/3-in-1 Stand to Sit: 4: Min assist;With upper extremity assist;With armrests;To chair/3-in-1 Details for Transfer Assistance: Cues for safe hand placement.  (A) for steadiness & safety.   Ambulation/Gait Ambulation/Gait Assistance: 4: Min assist Ambulation Distance (Feet): 50 Feet Assistive device: Rolling walker Ambulation/Gait Assistance Details: (A) for balance & safety.  Cues for safe use of RW  Gait Pattern: Step-through pattern;Decreased stride length;Narrow base of support;Scissoring General Gait Details: Distance limited due to pt requesting to return to room to use bathroom Stairs: No Wheelchair Mobility Wheelchair Mobility: No Modified Rankin (Stroke Patients Only) Pre-Morbid Rankin Score: No symptoms Modified Rankin: Moderately severe disability      PT Goals Acute Rehab PT Goals PT Goal Formulation: Patient unable to  participate in goal setting Time For Goal Achievement: 08/24/12 Potential to Achieve Goals: Fair Pt will go Supine/Side to Sit: with min assist PT Goal: Supine/Side to Sit - Progress: Met Pt will go Sit to Supine/Side: with min assist Pt will go Sit to Stand: with modified independence PT Goal: Sit to Stand - Progress: Not met Pt will go Stand to Sit: with modified independence PT Goal: Stand to Sit - Progress: Not met Pt will Transfer Bed to Chair/Chair to Bed: with modified independence Pt will Ambulate: with modified independence;51 - 150 feet;with least restrictive assistive device PT Goal: Ambulate - Progress: Not met  Visit Information  Last PT Received On: 08/14/12 Assistance Needed: +1    Subjective Data      Cognition  Overall Cognitive Status: Impaired Area of Impairment: Memory;Safety/judgement Arousal/Alertness: Awake/alert Orientation Level: Disoriented to;Place;Situation Behavior During Session: WFL for tasks performed Safety/Judgement: Decreased safety judgement for tasks assessed;Decreased awareness of need for assistance;Decreased awareness of safety precautions Cognition - Other Comments: Pt with increased confusion this session.  Pt with hallucinations.  Stating "Ive got to take care of these babies".      Balance     End of Session PT - End of Session Equipment Utilized During Treatment: Gait belt Activity Tolerance: Patient tolerated treatment well Patient left: in chair;with call bell/phone within reach;with chair alarm set Nurse Communication: Mobility status     Verdell Face, [place] [phone] 08/14/2012

## 2012-08-15 ENCOUNTER — Inpatient Hospital Stay (HOSPITAL_COMMUNITY): Payer: Medicare Other

## 2012-08-15 LAB — GLUCOSE, CAPILLARY: Glucose-Capillary: 108 mg/dL — ABNORMAL HIGH (ref 70–99)

## 2012-08-15 LAB — BASIC METABOLIC PANEL
CO2: 31 mEq/L (ref 19–32)
Calcium: 9.9 mg/dL (ref 8.4–10.5)
Chloride: 90 mEq/L — ABNORMAL LOW (ref 96–112)
GFR calc Af Amer: 85 mL/min — ABNORMAL LOW (ref >90)
Sodium: 134 mEq/L — ABNORMAL LOW (ref 135–145)

## 2012-08-15 MED ORDER — LORAZEPAM 2 MG/ML IJ SOLN
INTRAMUSCULAR | Status: AC
  Administered 2012-08-15: 1 mg via INTRAVENOUS
  Filled 2012-08-15: qty 1

## 2012-08-15 MED ORDER — LORAZEPAM 2 MG/ML IJ SOLN
1.0000 mg | Freq: Once | INTRAMUSCULAR | Status: AC
  Administered 2012-08-15: 1 mg via INTRAVENOUS

## 2012-08-15 MED ORDER — GADOBENATE DIMEGLUMINE 529 MG/ML IV SOLN: 10.0000 mL | Freq: Once | INTRAVENOUS | Status: AC | PRN

## 2012-08-15 NOTE — Progress Notes (Signed)
Patient given 20mg  Lasix at 2000. Noted at around 2200 that only 50ml urine in patient's foley catheter bag. Foley checked for kinks, patient bladder scanned (revealed 0ml), and foley catheter irrigated and was patent. Daphane Shepherd, NP notified of low urine output. No new orders received at this time.

## 2012-08-15 NOTE — Progress Notes (Signed)
Physical Therapy Treatment Patient Details Name: Margaret [name] MRN: [id number] DOB: [birth year] Today's Date: 08/15/2012 Time: 0833-0900 PT Time Calculation (min): 27 min  PT Assessment / Plan / Recommendation Comments on Treatment Session  Mobility seems to be limited by cognitive deficits as well as weakness & fatigue.      Follow Up Recommendations  SNF;Supervision/Assistance - 24 hour     Does the patient have the potential to tolerate intense rehabilitation     Barriers to Discharge        Equipment Recommendations       Recommendations for Other Services    Frequency Min 4X/week   Plan Discharge plan remains appropriate    Precautions / Restrictions Precautions Precautions: Fall Restrictions Weight Bearing Restrictions: No   Pertinent Vitals/Pain Pt c/o dizziness with ambulation- BP 91/49.      Mobility  Bed Mobility Bed Mobility: Supine to Sit;Sitting - Scoot to Edge of Bed Supine to Sit: 4: Min guard;HOB elevated;With rails Sitting - Scoot to Edge of Bed: 5: Supervision Details for Bed Mobility Assistance: Increased time to transition with cues for sequencing & technique.   Transfers Transfers: Sit to Stand;Stand to Sit Sit to Stand: 4: Min assist;With upper extremity assist;From bed Stand to Sit: 4: Min assist;With upper extremity assist;With armrests;To bed;To chair/3-in-1;1: +1 Total assist Details for Transfer Assistance: (A) for steadiness & safety.  Increased (A) for stand>sit on last trial due to pt c/o dizziness & falling backwards without control & keeping ahold of RW causing RW to tilt backwards.   Ambulation/Gait Ambulation/Gait Assistance: 4: Min assist Ambulation Distance (Feet): 20 Feet Assistive device: Rolling walker Ambulation/Gait Assistance Details: (A) for balance & safety.  Pt c/o weakness & fatigue.  Ambulated ~10' then required seated rest break on bed; ambulated bed>door (~10') then c/o dizziness & became very unsteady, falling backwards,  requiring recliner to brought directly behind pt & total (A) to descend to chair.  Pt eyes remained open but stopped responding to questions for brief moment.   Modified Rankin (Stroke Patients Only) Pre-Morbid Rankin Score: No symptoms Modified Rankin: Moderately severe disability      PT Goals Acute Rehab PT Goals Time For Goal Achievement: 08/24/12 Potential to Achieve Goals: Fair Pt will go Supine/Side to Sit: with min assist PT Goal: Supine/Side to Sit - Progress: Met Pt will go Sit to Supine/Side: with min assist Pt will go Sit to Stand: with modified independence PT Goal: Sit to Stand - Progress: Not met Pt will go Stand to Sit: with modified independence PT Goal: Stand to Sit - Progress: Not met Pt will Transfer Bed to Chair/Chair to Bed: with modified independence Pt will Ambulate: with modified independence;51 - 150 feet;with least restrictive assistive device PT Goal: Ambulate - Progress: Not progressing  Visit Information  Last PT Received On: 08/15/12 Assistance Needed: +1    Subjective Data      Cognition  Overall Cognitive Status: Impaired Area of Impairment: Memory;Following commands;Safety/judgement;Problem solving Orientation Level: Disoriented to;Place;Time;Situation Behavior During Session: Lethargic Following Commands: Follows multi-step commands inconsistently;Follows one step commands with increased time Safety/Judgement: Decreased awareness of safety precautions;Decreased safety judgement for tasks assessed Problem Solving: Pt required cueing for increased safety for tasks Cognition - Other Comments: Pt's confusion cont's to worsen as she is disoriented x 3 (only oriented to person).  Pt states "I had a baby last night"    Balance     End of Session PT - End of Session Equipment Utilized During Treatment: Gait  belt Activity Tolerance: Patient limited by fatigue Patient left: in chair;with call bell/phone within reach;with chair alarm set Nurse  Communication: Mobility status     Verdell Face, [place] [phone] 08/15/2012

## 2012-08-15 NOTE — Progress Notes (Signed)
15:00 Pt requested assistance to bathroom. Upon entering the room, the pt seemed much more awake than she had been previously in the day. She was still confused and hallucinating although knew she was in the hospital but not which one. She also stated the year was 2014 but she didn't know the day.   As she stood on the side of the bed, she began to be unresponsive to speech. She had an unfocused/blank stare and staggered back toward the bed. Her left side was weaker and her left arm had muscle twitching. She was immediately laid back in the bed. Her vitals and blood sugar levels were taken and were normal (BP 140, HR 79, O2 96, RR 20, temp 97.4, blood sugar 108). This incident only lasted for about 15 seconds.  Upon revaluation after the incident, the pt is at baseline from yesterday. She generally weak bilaterally. No signs of any twitching movement. She is still hallucinating and only knows who she is and she states that the year is "14-something."  The MD was paged. The pt is stable and awaiting MRI. Will continue to monitor.

## 2012-08-15 NOTE — Progress Notes (Signed)
[facility] PROGRESS NOTE  Margaret [name] OZH:[id number] DOB: 11/08/1923 DOA: 08/09/2012 PCP: Michele Mcalpine, MD  Assessment/Plan: 1. Dementia with acute Delirium: Unclear etiology, patient is quite confused and agitated. she continues to remain this way. Blood work was unremarkable. ABG noted mild hypoxia. CT scan negative. UA unimpressive. BNP somewhat elevated so started IV Lasix. She diuresed 1.5 L in the last 12 hours. In addition, have ordered MRI for today to check for stroke. Given the worsening confusion, it made her n.p.o. for re\re evaluation of swallowing 2. UTI (kleb PNA)- on IV Rocephin, day 6/7 3. Hyponatremia- resolved. Felt to be secondary to some volume overload 4. AMS due to UTI (acute metabolic encephalopathy)- improved, MRI negative for CVA. Now severe acute delirium see above 5. Dysphagia- given worsening delirium, higher risk for dysphasia now. Have asked speech to reevaluate 6. Fever- now remains afebrile. Initially felt to be secondary to UTI. 7. R/o CVA- MRI negative PT/OT- ?SNF vs ALF.   Code Status: DNR Family Communication: Discuss this with her daughter who is present at bedside 8. Disposition Plan: May need skilled nursing, need to figure out what his underlying cause of her acute delirium first    Consultants:  None  Procedures:  None  Antibiotics: Rocephin D6  HPI/Subjective: Patient remained quite agitated and confused. Denies any pain  Objective: Filed Vitals:   08/15/12 0422 08/15/12 0655 08/15/12 1038 08/15/12 1120  BP: 117/54 131/59 94/50 122/63  Pulse: 92 81  77  Temp:  97.4 F (36.3 C)  97.5 F (36.4 C)  TempSrc:  Oral  Oral  Resp:  14  16  Height:      Weight:      SpO2:  97%  95%    Intake/Output Summary (Last 24 hours) at 08/15/12 1201 Last data filed at 08/15/12 0800  Gross per 24 hour  Intake     60 ml  Output   1675 ml  Net  -1615 ml   Filed Weights   08/10/12 1443  Weight: 53.3 kg (117 lb 8.1 oz)     Exam:   General:  Easily agitated, hard for her to focus since her questions.  Cardiovascular: Regular rate and rhythm, S1-S2 occasional ectopic beat  Respiratory: Clear to auscultation bilaterally  Abdomen: +BS, soft, NT/ND  Extremities: No edema  Data Reviewed: Basic Metabolic Panel:  Lab 08/15/12 6295 08/14/12 1134 08/13/12 0725 08/12/12 0645 08/11/12 0051  NA 134* 132* 131* 127* 124*  K 3.4* 3.6 3.9 3.5 3.4*  CL 90* 92* 96 96 89*  CO2 31 30 24 25 24   GLUCOSE 110* 121* 125* 128* 112*  BUN 11 5* 7 14 17   CREATININE 0.78 0.47* 0.43* 0.62 0.87  CALCIUM 9.9 9.7 9.3 8.9 9.1  MG -- -- -- -- --  PHOS -- -- -- -- --   Liver Function Tests:  Lab 08/14/12 1134 08/09/12 2245  AST 30 31  ALT 16 15  ALKPHOS 68 75  BILITOT 0.3 0.6  PROT 7.2 7.2  ALBUMIN 3.8 3.9   CBC:  Lab 08/14/12 1134 08/12/12 0645 08/09/12 2245  WBC 4.3 4.1 5.8  NEUTROABS -- -- 3.1  HGB 11.3* 9.0* 10.5*  HCT 32.3* 25.5* 28.8*  MCV 86.8 87.3 83.7  PLT 177 129* 218   Cardiac Enzymes:  Lab 08/10/12 1701 08/10/12 1035  CKTOTAL -- --  CKMB -- --  CKMBINDEX -- --  TROPONINI 0.32* <0.30   CBG:  Lab 08/11/12 1623 08/11/12 1128 08/11/12 0805 08/11/12 0423  08/10/12 2203  GLUCAP 165* 127* 116* 119* 108*    Recent Results (from the past 240 hour(s))  URINE CULTURE     Status: Normal   Collection Time   08/10/12 12:06 AM      Component Value Range Status Comment   Specimen Description URINE, CATHETERIZED   Final    Special Requests NONE   Final    Culture  Setup Time 08/10/2012 11:05   Final    Colony Count >=100,000 COLONIES/ML   Final    Culture KLEBSIELLA PNEUMONIAE   Final    Report Status 08/11/2012 FINAL   Final    Organism ID, Bacteria KLEBSIELLA PNEUMONIAE   Final   CULTURE, BLOOD (ROUTINE X 2)     Status: Normal (Preliminary result)   Collection Time   08/10/12  2:15 PM      Component Value Range Status Comment   Specimen Description BLOOD RIGHT ARM   Final    Special Requests  BOTTLES DRAWN AEROBIC AND ANAEROBIC 10CC   Final    Culture  Setup Time 08/10/2012 18:44   Final    Culture     Final    Value:        BLOOD CULTURE RECEIVED NO GROWTH TO DATE CULTURE WILL BE HELD FOR 5 DAYS BEFORE ISSUING A FINAL NEGATIVE REPORT   Report Status PENDING   Incomplete   CULTURE, BLOOD (ROUTINE X 2)     Status: Normal (Preliminary result)   Collection Time   08/10/12  2:25 PM      Component Value Range Status Comment   Specimen Description BLOOD RIGHT ARM   Final    Special Requests BOTTLES DRAWN AEROBIC AND ANAEROBIC 10CC   Final    Culture  Setup Time 08/10/2012 18:44   Final    Culture     Final    Value:        BLOOD CULTURE RECEIVED NO GROWTH TO DATE CULTURE WILL BE HELD FOR 5 DAYS BEFORE ISSUING A FINAL NEGATIVE REPORT   Report Status PENDING   Incomplete      Studies: Ct Head Wo Contrast  08/14/2012     IMPRESSION: Stable. No acute intracranial abnormality.   Original Report Authenticated By: Erskine Speed, M.D.     Scheduled Meds:    . aspirin  81 mg Oral Daily  . cefTRIAXone (ROCEPHIN) IVPB 1 gram/50 mL D5W  1 g Intravenous Q24H  . ciprofloxacin  1 drop Both Eyes Q4H while awake  . feeding supplement  237 mL Oral Daily  . furosemide  20 mg Intravenous Q12H  . levothyroxine  75 mcg Oral QAC breakfast  . pantoprazole  40 mg Oral Daily  . pindolol  5 mg Oral BID  . polyethylene glycol  17 g Oral Daily  . senna-docusate  1 tablet Oral BID   Continuous Infusions:    Active Problems:  ANXIETY  CONSTIPATION  UTI (lower urinary tract infection)  Hyponatremia  CVA (cerebral infarction)  Confusion    Time spent: 30 minutes    Hollice Espy  [facility] Pager [phone] If 8PM-8AM, please contact night-coverage at [email or web address], password [facility] 08/15/2012, 12:01 PM  LOS: 6 days

## 2012-08-15 NOTE — Progress Notes (Signed)
SLP Cancellation Note  Patient Details Name: Margaret [name] MRN: [id number] DOB: 1924-01-31   Cancelled treatment:       Reason Eval/Treat Not Completed: Medical issues which prohibited therapy. Patient arousable however lethargic. Confused and on bed pan. Additionally, per RN and daughter who is present, patient with a recent period of "blacking out" when attempting to stand which also occurred this am. MRI pending. Given fluctuations in status and known h/o dysphagia with aspiration risk, plan to hold evaluation until am 1/4 if patient stable and pending MRI results.   Ferdinand Lango MA, CCC-SLP [phone]   Ferdinand Lango Meryl 08/15/2012, 3:39 PM

## 2012-08-16 DIAGNOSIS — N179 Acute kidney failure, unspecified: Secondary | ICD-10-CM | POA: Diagnosis present

## 2012-08-16 LAB — BASIC METABOLIC PANEL
BUN: 26 mg/dL — ABNORMAL HIGH (ref 6–23)
Calcium: 9.7 mg/dL (ref 8.4–10.5)
Creatinine, Ser: 1.88 mg/dL — ABNORMAL HIGH (ref 0.50–1.10)
GFR calc Af Amer: 27 mL/min — ABNORMAL LOW (ref >90)
GFR calc non Af Amer: 23 mL/min — ABNORMAL LOW (ref >90)

## 2012-08-16 LAB — CULTURE, BLOOD (ROUTINE X 2): Culture: NO GROWTH

## 2012-08-16 MED ORDER — SODIUM CHLORIDE 0.9 % IV SOLN
INTRAVENOUS | Status: DC
  Administered 2012-08-16 – 2012-08-18 (×2): via INTRAVENOUS

## 2012-08-16 NOTE — Progress Notes (Signed)
Foley removed at 1340. Pt due to void.  Bladder scan showed 17mL currently in bladder. IVFs NS @ 50cc/hr infusing through IV. Will continue to monitor.

## 2012-08-16 NOTE — Evaluation (Signed)
Clinical/Bedside Swallow Evaluation Patient Details  Name: Margaret [name] MRN: [id number] Date of Birth: [birth year]  Today's Date: 08/16/2012 Time: 1120-1205 SLP Time Calculation (min): 45 min  Past Medical History:  Past Medical History  Diagnosis Date  . Unspecified sinusitis (chronic)   . Unspecified essential hypertension   . Cerebrovascular disease, unspecified   . Unspecified venous (peripheral) insufficiency   . Pure hypercholesterolemia   . Unspecified disorder of thyroid   . Esophageal reflux   . Unspecified constipation   . Urinary tract infection, site not specified   . Osteoarthrosis, unspecified whether generalized or localized, unspecified site   . Degeneration of cervical intervertebral disc   . Closed fracture of other bone of wrist   . Anxiety state, unspecified   . Idiopathic urticaria   . Anemia, unspecified   . Other B-complex deficiencies   . Herpes zoster without mention of complication    Past Surgical History:  Past Surgical History  Procedure Date  . Tonsillectomy   . Total abdominal hysterectomy w/ bilateral salpingoophorectomy   . Cervical fusion   . Cataract extraction   . Nasal sinus surgery   . Esophagogastroduodenoscopy 09/10/2011    Procedure: ESOPHAGOGASTRODUODENOSCOPY (EGD);  Surgeon: Petra Kuba, MD;  Location: [facility] ENDOSCOPY;  Service: Endoscopy;  Laterality: N/A;  c-arm needed  . Savory dilation 09/10/2011    Procedure: SAVORY DILATION;  Surgeon: Petra Kuba, MD;  Location: [facility] ENDOSCOPY;  Service: Endoscopy;  Laterality: N/A;  . Esophagogastroduodenoscopy 05/13/2012    Procedure: ESOPHAGOGASTRODUODENOSCOPY (EGD);  Surgeon: Petra Kuba, MD;  Location: [facility] ENDOSCOPY;  Service: Endoscopy;  Laterality: N/A;  with carm  . Savory dilation 05/13/2012    Procedure: SAVORY DILATION;  Surgeon: Petra Kuba, MD;  Location: [facility] ENDOSCOPY;  Service: Endoscopy;  Laterality: N/A;   HPI:  79 yr old admitted with confusion and dysuria.  Found to have UTI  and hypokalemia and failed RN swallow screen. Pt assessed by SLP at bedside, started on dys 3/thin liquids diet in conjunction with pt/family wishes to continue POs despite baseline severe dysphagia. Pt then found to have acute infact during stay (right occipital), SLP again ordered given change in status.  CXR 12/29 did not reveal abnormalitites.  Significant pharyngeal and esophageal dysphgia history.  Esophageal dilation x 7 with most recent 10/13, MBS 08/31/11 with significant esophageal dysphagia with thin liquid recommended (pt. underwent esophageal dilitaiton soon after MBS).  History of cervical surgery with hardware seen to impinge on UES functioning.Family states pt. has only had "a touch of pna once this past Sept."  She is mobile with a walker.  Family reports frequent throat clearing and some coughing during meals at home which has "gone on for years."    Assessment / Plan / Recommendation Clinical Impression  Pt reassessed given finding of new CVA. Pt continues to demonstrate evidence of severe pharyngeal and esophageal dysphagia with little change from baseline. Pt with limited to no hyolaryngeal elevation, mutliple swallow indicative of residual followed by weak coughing/throat clearing. Daughter at bedside confirms that her function is not very changed from her baseline. High aspiration risk persists with all PO, Pt and family continue to verbalize desire to continue PO intake despite risk. Pt does not even want alternate nutrition and is still capable of verbalizing her wishes. Recommend a dys 2/thin liquid diet with aspiration and esophageal precautions which were reviewed with pt/family. No skilled therapy required at this time. Will sign off.     Aspiration Risk  Severe    Diet Recommendation Dysphagia 2 (Fine chop);Thin liquid   Liquid Administration via: Cup;Straw Medication Administration: Crushed with puree Supervision: Patient able to self feed;Full supervision/cueing for  compensatory strategies Compensations: Slow rate;Small sips/bites;Multiple dry swallows after each bite/sip;Follow solids with liquid Postural Changes and/or Swallow Maneuvers: Seated upright 90 degrees;Upright 30-60 min after meal    Other  Recommendations Oral Care Recommendations: Oral care BID   Follow Up Recommendations  Skilled Nursing facility    Frequency and Duration        Pertinent Vitals/Pain NA    SLP Swallow Goals     Swallow Study Prior Functional Status       General HPI: 79 yr old admitted with confusion and dysuria.  Found to have UTI and hypokalemia and failed RN swallow screen. Pt assessed by SLP at bedside, started on dys 3/thin liquids diet in conjunction with pt/family wishes to continue POs despite baseline severe dysphagia. Pt then found to have acute infact during stay (right occipital), SLP again ordered given change in status.  CXR 12/29 did not reveal abnormalitites.  Significant pharyngeal and esophageal dysphgia history.  Esophageal dilation x 7 with most recent 10/13, MBS 08/31/11 with significant esophageal dysphagia with thin liquid recommended (pt. underwent esophageal dilitaiton soon after MBS).  History of cervical surgery with hardware seen to impinge on UES functioning.Family states pt. has only had "a touch of pna once this past Sept."  She is mobile with a walker.  Family reports frequent throat clearing and some coughing during meals at home which has "gone on for years."  Type of Study: Bedside swallow evaluation Previous Swallow Assessment: see history Diet Prior to this Study: NPO Temperature Spikes Noted: No Respiratory Status: Room air History of Recent Intubation: No Behavior/Cognition: Alert;Cooperative;Pleasant mood;Requires cueing Oral Cavity - Dentition: Poor condition Self-Feeding Abilities: Able to feed self Patient Positioning: Upright in bed Baseline Vocal Quality: Clear Volitional Cough: Weak Volitional Swallow: Able to elicit     Oral/Motor/Sensory Function Overall Oral Motor/Sensory Function: Impaired at baseline (weak labial closure, bottom lip)   Ice Chips Ice chips: Impaired Presentation: Spoon Oral Phase Impairments: Reduced labial seal Pharyngeal Phase Impairments: Decreased hyoid-laryngeal movement;Suspected delayed Swallow   Thin Liquid Thin Liquid: Impaired Presentation: Cup;Straw Oral Phase Impairments: Reduced labial seal Pharyngeal  Phase Impairments: Decreased hyoid-laryngeal movement;Suspected delayed Swallow;Multiple swallows;Throat Clearing - Immediate;Cough - Immediate    Nectar Thick Nectar Thick Liquid: Not tested   Honey Thick Honey Thick Liquid: Not tested   Puree Puree: Impaired Presentation: Spoon;Self Fed Oral Phase Impairments: Reduced labial seal;Reduced lingual movement/coordination Pharyngeal Phase Impairments: Multiple swallows;Throat Clearing - Immediate;Cough - Immediate;Decreased hyoid-laryngeal movement   Solid   GO    Solid: Not tested      Harlon Ditty, MA CCC-SLP [phone]  Marge Vandermeulen, Riley Nearing 08/16/2012,12:17 PM

## 2012-08-16 NOTE — Progress Notes (Signed)
[facility] PROGRESS NOTE  Margaret [name] ZOX:[id number] DOB: April 14, 1924 DOA: 08/09/2012 PCP: Michele Mcalpine, MD  Assessment/Plan: 1. Acute CVA: MRI done yesterday confirms acute CVA of the right occipital lobe. Unclear etiology. Patient had a partial stroke workup on admission for altered mental status. At that time echocardiogram and carotid Dopplers unremarkable. This may be arrhythmia versus small vessel disease. Patient's also had elevated blood pressures as well. Have asked neurology for assistance. 2. UTI (kleb PNA)- completed antibiotics today. 3. Hyponatremia- resolved. Felt to be secondary to some volume overload 4. AMS due to UTI (acute metabolic encephalopathy)- improved, MRI negative for CVA.  5. 2 delirium secondary CVA 6. Dysphagia- speech therapy has seen and readjusted. Patient's swallowing ability is slightly worse. 7. ARF: Mild overdiuresis. Discontinued Lasix and started gentle IV fluids 8. Senile dementia-mild with acute delirium brought on by CVA: Patient is actually showing some signs of improvement today. A little bit more interactive and appropriate. Less agitated. Still not back to baseline. 9. Acute on chronic grade 1 diastolic heart failure: Status post diuresis. See above. Slight overdiuresis.    Code Status: DNR Family Communication: Discuss this with her daughter who is present at bedside 10. Disposition Plan: May need skilled nursing, need to figure out what his underlying cause of her acute delirium first    Consultants:  Neurology  Procedures:  None  Antibiotics: Complete a seven-day course of Rocephin on 1/4   HPI/Subjective: Patient remained somewhat confused. Less agitated. More appropriate. Denies any pain. No headache or chest pain or shortness of breath  Objective: Filed Vitals:   08/16/12 0200 08/16/12 0600 08/16/12 1028 08/16/12 1414  BP: 107/50 119/56 94/46 96/48   Pulse: 87 105 86 87  Temp: 97.4 F (36.3 C) 97.8 F (36.6 C)  97.6 F (36.4 C) 97.7 F (36.5 C)  TempSrc:   Oral Oral  Resp: 16 16 18 18   Height:      Weight:      SpO2: 98% 99% 98% 95%    Intake/Output Summary (Last 24 hours) at 08/16/12 1547 Last data filed at 08/16/12 1416  Gross per 24 hour  Intake    240 ml  Output    150 ml  Net     90 ml   Filed Weights   08/10/12 1443  Weight: 53.3 kg (117 lb 8.1 oz)    Exam:   General:  Easily agitated, hard for her to focus since her questions.  Cardiovascular: Regular rate and rhythm, S1-S2 occasional ectopic beat  Respiratory: Clear to auscultation bilaterally  Abdomen: +BS, soft, NT/ND  Extremities: No edema  Neurological exam: Slightly nonfocal, more because patient has somewhat of a difficult time focusing to cooperate.  Data Reviewed: Basic Metabolic Panel:  Lab 08/16/12 8119 08/15/12 0645 08/14/12 1134 08/13/12 0725 08/12/12 0645  NA 132* 134* 132* 131* 127*  K 3.4* 3.4* 3.6 3.9 3.5  CL 90* 90* 92* 96 96  CO2 29 31 30 24 25   GLUCOSE 98 110* 121* 125* 128*  BUN 26* 11 5* 7 14  CREATININE 1.88* 0.78 0.47* 0.43* 0.62  CALCIUM 9.7 9.9 9.7 9.3 8.9  MG -- -- -- -- --  PHOS -- -- -- -- --   Liver Function Tests:  Lab 08/14/12 1134 08/09/12 2245  AST 30 31  ALT 16 15  ALKPHOS 68 75  BILITOT 0.3 0.6  PROT 7.2 7.2  ALBUMIN 3.8 3.9   CBC:  Lab 08/14/12 1134 08/12/12 0645 08/09/12 2245  WBC 4.3  4.1 5.8  NEUTROABS -- -- 3.1  HGB 11.3* 9.0* 10.5*  HCT 32.3* 25.5* 28.8*  MCV 86.8 87.3 83.7  PLT 177 129* 218   Cardiac Enzymes:  Lab 08/15/12 1209 08/10/12 1701 08/10/12 1035  CKTOTAL -- -- --  CKMB -- -- --  CKMBINDEX -- -- --  TROPONINI <0.30 0.32* <0.30   CBG:  Lab 08/15/12 1510 08/11/12 1623 08/11/12 1128 08/11/12 0805 08/11/12 0423  GLUCAP 108* 165* 127* 116* 119*    Recent Results (from the past 240 hour(s))  URINE CULTURE     Status: Normal   Collection Time   08/10/12 12:06 AM      Component Value Range Status Comment   Specimen Description URINE,  CATHETERIZED   Final    Special Requests NONE   Final    Culture  Setup Time 08/10/2012 11:05   Final    Colony Count >=100,000 COLONIES/ML   Final    Culture KLEBSIELLA PNEUMONIAE   Final    Report Status 08/11/2012 FINAL   Final    Organism ID, Bacteria KLEBSIELLA PNEUMONIAE   Final   CULTURE, BLOOD (ROUTINE X 2)     Status: Normal (Preliminary result)   Collection Time   08/10/12  2:15 PM      Component Value Range Status Comment   Specimen Description BLOOD RIGHT ARM   Final    Special Requests BOTTLES DRAWN AEROBIC AND ANAEROBIC 10CC   Final    Culture  Setup Time 08/10/2012 18:44   Final    Culture     Final    Value:        BLOOD CULTURE RECEIVED NO GROWTH TO DATE CULTURE WILL BE HELD FOR 5 DAYS BEFORE ISSUING A FINAL NEGATIVE REPORT   Report Status PENDING   Incomplete   CULTURE, BLOOD (ROUTINE X 2)     Status: Normal (Preliminary result)   Collection Time   08/10/12  2:25 PM      Component Value Range Status Comment   Specimen Description BLOOD RIGHT ARM   Final    Special Requests BOTTLES DRAWN AEROBIC AND ANAEROBIC 10CC   Final    Culture  Setup Time 08/10/2012 18:44   Final    Culture     Final    Value:        BLOOD CULTURE RECEIVED NO GROWTH TO DATE CULTURE WILL BE HELD FOR 5 DAYS BEFORE ISSUING A FINAL NEGATIVE REPORT   Report Status PENDING   Incomplete      Studies: Ct Head Wo Contrast  08/14/2012     IMPRESSION: Stable. No acute intracranial abnormality.   Original Report Authenticated By: Erskine Speed, M.D.     Scheduled Meds:    . aspirin  81 mg Oral Daily  . ciprofloxacin  1 drop Both Eyes Q4H while awake  . feeding supplement  237 mL Oral Daily  . levothyroxine  75 mcg Oral QAC breakfast  . pantoprazole  40 mg Oral Daily  . pindolol  5 mg Oral BID  . polyethylene glycol  17 g Oral Daily  . senna-docusate  1 tablet Oral BID   Continuous Infusions:    . sodium chloride 50 mL/hr at 08/16/12 1235    Active Problems:  ANXIETY  CONSTIPATION  UTI  (lower urinary tract infection)  Hyponatremia  CVA (cerebral infarction)  Confusion    Time spent: 30 minutes    Hollice Espy  [facility] Pager [phone] If 8PM-8AM, please contact night-coverage at  [email or web address], password [facility] 08/16/2012, 3:47 PM  LOS: 7 days

## 2012-08-16 NOTE — Progress Notes (Signed)
[facility] on call notified again of patient's extreme low urine output. No new orders received. Will continue to monitor.

## 2012-08-16 NOTE — Progress Notes (Signed)
Stroke Team Progress Note  HISTORY 79 y.o. female sinusitis, essential hypertension; Cerebrovascular disease, unspecified; Unspecified venous (peripheral) insufficiency; Pure hypercholesterolemia; Unspecified disorder of thyroid; Esophageal reflux; Unspecified constipation; Urinary tract infection, site not specified; Osteoarthrosis, unspecified whether generalized or localized, unspecified site; Degeneration of cervical intervertebral disc; Closed fracture of other bone of wrist; Anxiety state, unspecified; Idiopathic urticaria; Anemia, unspecified; Other B-complex deficiencies; and Herpes zoster without mention of complication.   She presented with agitation and confusion. She was found to have UTI on 12/29. CT scan was worrisome for CVA of indetermined age. Pt was started on antibiotics and her mental status has improved.   Pt is s/p MRI brain which showed mm area of acute infarct in the right occipital    OBJECTIVE Most recent Vital Signs: Filed Vitals:   08/16/12 1028 08/16/12 1414 08/16/12 1800 08/16/12 2200  BP: 94/46 96/48 132/67 123/62  Pulse: 86 87 81 87  Temp: 97.6 F (36.4 C) 97.7 F (36.5 C) 98.7 F (37.1 C) 97 F (36.1 C)  TempSrc: Oral Oral Oral Oral  Resp: 18 18 18 18   Height:      Weight:      SpO2: 98% 95% 99% 98%   CBG (last 3)   Basename 08/15/12 1510  GLUCAP 108*    IV Fluid Intake:     . sodium chloride 50 mL/hr at 08/16/12 1235    MEDICATIONS    . aspirin  81 mg Oral Daily  . ciprofloxacin  1 drop Both Eyes Q4H while awake  . feeding supplement  237 mL Oral Daily  . levothyroxine  75 mcg Oral QAC breakfast  . pantoprazole  40 mg Oral Daily  . pindolol  5 mg Oral BID  . polyethylene glycol  17 g Oral Daily  . senna-docusate  1 tablet Oral BID   PRN:  acetaminophen, acetaminophen, hydrALAZINE, mineral oil, ondansetron (ZOFRAN) IV, polyvinyl alcohol  Diet:  Dysphagia  DVT Prophylaxis: SCDs  CLINICALLY SIGNIFICANT STUDIES Basic Metabolic Panel:    Lab 08/16/12 0740 08/15/12 0645  NA 132* 134*  K 3.4* 3.4*  CL 90* 90*  CO2 29 31  GLUCOSE 98 110*  BUN 26* 11  CREATININE 1.88* 0.78  CALCIUM 9.7 9.9  MG -- --  PHOS -- --   Liver Function Tests:  Lab 08/14/12 1134 08/09/12 2245  AST 30 31  ALT 16 15  ALKPHOS 68 75  BILITOT 0.3 0.6  PROT 7.2 7.2  ALBUMIN 3.8 3.9   CBC:  Lab 08/14/12 1134 08/12/12 0645 08/09/12 2245  WBC 4.3 4.1 --  NEUTROABS -- -- 3.1  HGB 11.3* 9.0* --  HCT 32.3* 25.5* --  MCV 86.8 87.3 --  PLT 177 129* --   Coagulation:  Lab 08/09/12 2245  LABPROT 12.9  INR 0.98   Cardiac Enzymes:  Lab 08/15/12 1209 08/10/12 1701 08/10/12 1035  CKTOTAL -- -- --  CKMB -- -- --  CKMBINDEX -- -- --  TROPONINI <0.30 0.32* <0.30   Urinalysis:  Lab 08/14/12 1750 08/10/12 0006  COLORURINE YELLOW YELLOW  LABSPEC 1.011 1.007  PHURINE 7.5 7.5  GLUCOSEU NEGATIVE NEGATIVE  HGBUR SMALL* SMALL*  BILIRUBINUR NEGATIVE NEGATIVE  KETONESUR NEGATIVE NEGATIVE  PROTEINUR 100* NEGATIVE  UROBILINOGEN 0.2 0.2  NITRITE NEGATIVE NEGATIVE  LEUKOCYTESUR SMALL* LARGE*   Lipid Panel    Component Value Date/Time   CHOL 200 08/11/2012 0807   TRIG 76 08/11/2012 0807   HDL 78 08/11/2012 0807   CHOLHDL 2.6 08/11/2012 2130  VLDL 15 08/11/2012 0807   LDLCALC 107* 08/11/2012 0807   HgbA1C  Lab Results  Component Value Date   HGBA1C 5.8* 08/11/2012    Urine Drug Screen:   No results found for this basename: labopia, cocainscrnur, labbenz, amphetmu, thcu, labbarb    Alcohol Level: No results found for this basename: ETH:2 in the last 168 hours  Mr Laqueta Jean Wo Contrast  08/16/2012  *RADIOLOGY REPORT*  Clinical Data: Worsening confusion.  CVA.  MRI HEAD WITHOUT AND WITH CONTRAST  Technique:  Multiplanar, multiecho pulse sequences of the brain and surrounding structures were obtained according to standard protocol without and with intravenous contrast  Contrast:  10 ml Multihance IV  Comparison:  CT 08/14/2012  Findings: 5 mm  area of restricted diffusion in the right occipital white matter compatible with acute infarct.  No other area of acute infarction.  Age appropriate atrophy.  Moderate to advanced chronic microvascular ischemic changes throughout the cerebral white matter.  Mild chronic ischemia in the basal ganglia and pons.  No cerebellar infarct.  Negative for hemorrhage or fluid collection.  Negative for mass or edema.  No midline shift.  Mild chronic sinusitis.  Postcontrast imaging reveals no enhancing lesion.  IMPRESSION: 5 mm area of acute infarct in the right occipital white matter.  Moderate to advanced chronic microvascular ischemia.   Original Report Authenticated By: Janeece Riggers, M.D.     CT of the brain  No acute intracranial abnormality.  MRI of the brain  5 mm area of acute infarct in the right occipital  MRA of the brain  No major vessel occlusion or correctable proximal stenosis.   2D Echocardiogram  65-70% EF  Carotid Doppler  Not done  CXR     Physical Exam  Mental Status: Alert, oriented, thought content appropriate.  Speech fluent without evidence of aphasia.  Able to follow 3 step commands without difficulty. Cranial Nerves: II: Discs flat bilaterally; Visual fields grossly normal, pupils equal, round, reactive to light and accommodation III,IV, VI: ptosis not present, extra-ocular motions intact bilaterally V,VII: smile symmetric, facial light touch sensation normal bilaterally VIII: hearing normal bilaterally IX,X: gag reflex present XI: bilateral shoulder shrug XII: midline tongue extension Motor: Right : Upper extremity   5/5    Left:     Upper extremity   5/5  Lower extremity   5/5     Lower extremity   5/5 Tone and bulk:normal tone throughout; no atrophy noted Sensory: Pinprick and light touch intact throughout, bilaterally Deep Tendon Reflexes: 1+ and symmetric throughout Plantars: Right: downgoing   Left: downgoing Cerebellar: normal finger-to-nose, normal rapid  alternating movements and normal heel-to-shin test Gait: not accessed.  CV: pulses palpable throughout      ASSESSMENT She presented with agitation and confusion. She was found to have UTI on 12/29. CT scan was worrisome for CVA of indetermined age. Pt was started on antibiotics and her mental status has improved.   Pt is s/p MRI brain which showed mm area of acute infarct in the right occipital Hospital day # 7  TREATMENT/PLAN  Increase her ASA to 325  Start statin  Carotid doppler  Hemoglobin A1C, lipid panel  Speech/PT/OT  Start DVT prophylaxis.    I have personally obtained a history, examined the patient, evaluated imaging results, and formulated the assessment and plan of care. I agree with the above.

## 2012-08-16 NOTE — Progress Notes (Signed)
Per previous notes foley removed today at 1340. Throughout night shift, patient has urge to void, but is unable to void. Bladder scans revealing 49ml at 2300. M. Lynch, NP notified of no urine output and bladder scans. No new orders received at this time. Will continue to monitor patient.

## 2012-08-17 DIAGNOSIS — I10 Essential (primary) hypertension: Secondary | ICD-10-CM

## 2012-08-17 LAB — BASIC METABOLIC PANEL
BUN: 35 mg/dL — ABNORMAL HIGH (ref 6–23)
Chloride: 92 mEq/L — ABNORMAL LOW (ref 96–112)
Creatinine, Ser: 1.76 mg/dL — ABNORMAL HIGH (ref 0.50–1.10)
Glucose, Bld: 121 mg/dL — ABNORMAL HIGH (ref 70–99)
Potassium: 3.4 mEq/L — ABNORMAL LOW (ref 3.5–5.1)

## 2012-08-17 NOTE — Progress Notes (Signed)
Patient still had not voided this morning. Bladder scan performed revealing . Patient still felt urge. Tried to void. Was able to void 75ml. Will continue to monitor.

## 2012-08-17 NOTE — Progress Notes (Signed)
[facility] PROGRESS NOTE  Margaret [name] ZOX:[id number] DOB: Mar 24, 1924 DOA: 08/09/2012 PCP: Michele Mcalpine, MD  Assessment/Plan: 1. Acute CVA: MRI done yesterday confirms acute CVA of the right occipital lobe. Unclear etiology. Patient had a partial stroke workup on admission for altered mental status. At that time echocardiogram and carotid Dopplers unremarkable. This may be arrhythmia versus small vessel disease. Patient's also had elevated blood pressures as well. Appreciate neurology help. Aspirin increased to 325 mg plus that was started. Carotid Doppler and echocardiogram are done earlier were unremarkable. A1c lipid profile also unimpressive. Appreciate speech therapy help. Diet adjusted. 2. UTI (kleb PNA)- completed antibiotics. 3. Hyponatremia- resolved. Felt to be secondary to some volume overload 4. AMS due to UTI (acute metabolic encephalopathy)-and then from CVA. Continues to improve. 5. Dysphagia- speech therapy has seen and readjusted. Patient's swallowing ability is slightly worse. 6. ARF: Mild overdiuresis. Discontinued Lasix and started gentle IV fluids, continuing to improve 7. Senile dementia-mild with acute delirium brought on by CVA: Patient is actually showing some signs of improvement today. A little bit more interactive and appropriate. Less agitated. Still not back to baseline. 8. Acute on chronic grade 1 diastolic heart failure: Status post diuresis. See above. Slight overdiuresis.    Code Status: DNR Family Communication: Discuss this with her daughter who is present at bedside 9. Disposition Plan: likely will need short-term skilled nursing before looking at lower level of care.    Consultants:  Neurology  Procedures:  None  Antibiotics: Complete a seven-day course of Rocephin  HPI/Subjective: Patient continues to be more alert and oriented. No complaints. Some slurred speech, she is appropriate however  Objective: Filed Vitals:   08/16/12  2200 08/17/12 0207 08/17/12 0548 08/17/12 0903  BP: 123/62 116/57 127/56 111/54  Pulse: 87 78 73 81  Temp: 97 F (36.1 C) 97.4 F (36.3 C) 97.8 F (36.6 C) 97.8 F (36.6 C)  TempSrc: Oral Oral Oral   Resp: 18 19 18 20   Height:      Weight:      SpO2: 98% 98% 100% 98%    Intake/Output Summary (Last 24 hours) at 08/17/12 1532 Last data filed at 08/17/12 8119  Gross per 24 hour  Intake    240 ml  Output    175 ml  Net     65 ml   Filed Weights   08/10/12 1443  Weight: 53.3 kg (117 lb 8.1 oz)    Exam:   General:  Pleasant, alert and oriented, interactive  Cardiovascular: Regular rate and rhythm, S1-S2 occasional ectopic beat  Respiratory: Clear to auscultation bilaterally  Abdomen: +BS, soft, NT/ND  Extremities: No edema  Neurological exam: nonspecific weakness. Some mild facial droop on the lright  Data Reviewed: Basic Metabolic Panel:  Lab 08/17/12 1478 08/16/12 0740 08/15/12 0645 08/14/12 1134 08/13/12 0725  NA 132* 132* 134* 132* 131*  K 3.4* 3.4* 3.4* 3.6 3.9  CL 92* 90* 90* 92* 96  CO2 29 29 31 30 24   GLUCOSE 121* 98 110* 121* 125*  BUN 35* 26* 11 5* 7  CREATININE 1.76* 1.88* 0.78 0.47* 0.43*  CALCIUM 9.5 9.7 9.9 9.7 9.3  MG -- -- -- -- --  PHOS -- -- -- -- --   Liver Function Tests:  Lab 08/14/12 1134  AST 30  ALT 16  ALKPHOS 68  BILITOT 0.3  PROT 7.2  ALBUMIN 3.8   CBC:  Lab 08/14/12 1134 08/12/12 0645  WBC 4.3 4.1  NEUTROABS -- --  HGB 11.3* 9.0*  HCT 32.3* 25.5*  MCV 86.8 87.3  PLT 177 129*   Cardiac Enzymes:  Lab 08/15/12 1209 08/10/12 1701  CKTOTAL -- --  CKMB -- --  CKMBINDEX -- --  TROPONINI <0.30 0.32*   CBG:  Lab 08/15/12 1510 08/11/12 1623 08/11/12 1128 08/11/12 0805 08/11/12 0423  GLUCAP 108* 165* 127* 116* 119*    Recent Results (from the past 240 hour(s))  URINE CULTURE     Status: Normal   Collection Time   08/10/12 12:06 AM      Component Value Range Status Comment   Specimen Description URINE,  CATHETERIZED   Final    Special Requests NONE   Final    Culture  Setup Time 08/10/2012 11:05   Final    Colony Count >=100,000 COLONIES/ML   Final    Culture KLEBSIELLA PNEUMONIAE   Final    Report Status 08/11/2012 FINAL   Final    Organism ID, Bacteria KLEBSIELLA PNEUMONIAE   Final   CULTURE, BLOOD (ROUTINE X 2)     Status: Normal   Collection Time   08/10/12  2:15 PM      Component Value Range Status Comment   Specimen Description BLOOD RIGHT ARM   Final    Special Requests BOTTLES DRAWN AEROBIC AND ANAEROBIC 10CC   Final    Culture  Setup Time 08/10/2012 18:44   Final    Culture NO GROWTH 5 DAYS   Final    Report Status 08/16/2012 FINAL   Final   CULTURE, BLOOD (ROUTINE X 2)     Status: Normal   Collection Time   08/10/12  2:25 PM      Component Value Range Status Comment   Specimen Description BLOOD RIGHT ARM   Final    Special Requests BOTTLES DRAWN AEROBIC AND ANAEROBIC 10CC   Final    Culture  Setup Time 08/10/2012 18:44   Final    Culture NO GROWTH 5 DAYS   Final    Report Status 08/16/2012 FINAL   Final      Studies: Ct Head Wo Contrast  08/14/2012     IMPRESSION: Stable. No acute intracranial abnormality.   Original Report Authenticated By: Erskine Speed, M.D.     Scheduled Meds:    . aspirin  81 mg Oral Daily  . ciprofloxacin  1 drop Both Eyes Q4H while awake  . feeding supplement  237 mL Oral Daily  . levothyroxine  75 mcg Oral QAC breakfast  . pantoprazole  40 mg Oral Daily  . pindolol  5 mg Oral BID  . polyethylene glycol  17 g Oral Daily  . senna-docusate  1 tablet Oral BID   Continuous Infusions:    . sodium chloride 50 mL/hr at 08/16/12 1235    Active Problems:  ANXIETY  HYPERTENSION  CONSTIPATION  Hypothyroidism  UTI (lower urinary tract infection)  Hyponatremia  Dysphagia  CVA (cerebral infarction)  Confusion  ARF (acute renal failure)    Time spent: 25 minutes    Hollice Espy  [facility] Pager [phone] If 8PM-8AM,  please contact night-coverage at [email or web address], password [facility] 08/17/2012, 3:32 PM  LOS: 8 days

## 2012-08-18 DIAGNOSIS — E7800 Pure hypercholesterolemia, unspecified: Secondary | ICD-10-CM

## 2012-08-18 LAB — BASIC METABOLIC PANEL
BUN: 29 mg/dL — ABNORMAL HIGH (ref 6–23)
CO2: 29 mEq/L (ref 19–32)
Calcium: 8.9 mg/dL (ref 8.4–10.5)
Chloride: 95 mEq/L — ABNORMAL LOW (ref 96–112)
Creatinine, Ser: 0.97 mg/dL (ref 0.50–1.10)

## 2012-08-18 MED ORDER — SIMVASTATIN 20 MG PO TABS
20.0000 mg | ORAL_TABLET | Freq: Every day | ORAL | Status: DC
  Administered 2012-08-18 – 2012-08-19 (×2): 20 mg via ORAL
  Filled 2012-08-18 (×3): qty 1

## 2012-08-18 NOTE — Progress Notes (Signed)
Stroke Team Progress Note  HISTORY 79 y.o. female sinusitis, essential hypertension; Cerebrovascular disease, unspecified; Unspecified venous (peripheral) insufficiency; Pure hypercholesterolemia; Unspecified disorder of thyroid; Esophageal reflux; Unspecified constipation; Urinary tract infection, site not specified; Osteoarthrosis, unspecified whether generalized or localized, unspecified site; Degeneration of cervical intervertebral disc; Closed fracture of other bone of wrist; Anxiety state, unspecified; Idiopathic urticaria; Anemia, unspecified; Other B-complex deficiencies; and Herpes zoster without mention of complication.   She presented with agitation and confusion. She was found to have UTI on 12/29. CT scan was worrisome for CVA of indetermined age. Pt was started on antibiotics and her mental status has improved.   Pt is s/p MRI brain which showed mm area of acute infarct in the right occipital  SUBJECTIVE Daughter at bedside  OBJECTIVE Most recent Vital Signs: Filed Vitals:   08/17/12 1739 08/17/12 2137 08/18/12 0208 08/18/12 0440  BP: 107/51 140/69 158/62 154/64  Pulse: 71 74 73 90  Temp: 97.8 F (36.6 C) 98 F (36.7 C) 98.5 F (36.9 C) 97.6 F (36.4 C)  TempSrc:  Oral Oral Oral  Resp: 20 16 18 18   Height:      Weight:      SpO2: 98% 98% 99% 99%   CBG (last 3)   Basename 08/15/12 1510  GLUCAP 108*   IV Fluid Intake:     . sodium chloride 50 mL/hr at 08/18/12 0101   MEDICATIONS    . aspirin  81 mg Oral Daily  . ciprofloxacin  1 drop Both Eyes Q4H while awake  . feeding supplement  237 mL Oral Daily  . levothyroxine  75 mcg Oral QAC breakfast  . pantoprazole  40 mg Oral Daily  . pindolol  5 mg Oral BID  . polyethylene glycol  17 g Oral Daily  . senna-docusate  1 tablet Oral BID   PRN:  acetaminophen, acetaminophen, hydrALAZINE, mineral oil, ondansetron (ZOFRAN) IV, polyvinyl alcohol  Diet:  Dysphagia 2 thin liquids DVT Prophylaxis: SCDs  CLINICALLY  SIGNIFICANT STUDIES Basic Metabolic Panel:   Lab 08/18/12 0800 08/17/12 0810  NA 133* 132*  K 3.5 3.4*  CL 95* 92*  CO2 29 29  GLUCOSE 131* 121*  BUN 29* 35*  CREATININE 0.97 1.76*  CALCIUM 8.9 9.5  MG -- --  PHOS -- --   Liver Function Tests:   Lab 08/14/12 1134  AST 30  ALT 16  ALKPHOS 68  BILITOT 0.3  PROT 7.2  ALBUMIN 3.8   CBC:   Lab 08/14/12 1134 08/12/12 0645  WBC 4.3 4.1  NEUTROABS -- --  HGB 11.3* 9.0*  HCT 32.3* 25.5*  MCV 86.8 87.3  PLT 177 129*   Coagulation: No results found for this basename: LABPROT:4,INR:4 in the last 168 hours Cardiac Enzymes:   Lab 08/15/12 1209  CKTOTAL --  CKMB --  CKMBINDEX --  TROPONINI <0.30   Urinalysis:   Lab 08/14/12 1750  COLORURINE YELLOW  LABSPEC 1.011  PHURINE 7.5  GLUCOSEU NEGATIVE  HGBUR SMALL*  BILIRUBINUR NEGATIVE  KETONESUR NEGATIVE  PROTEINUR 100*  UROBILINOGEN 0.2  NITRITE NEGATIVE  LEUKOCYTESUR SMALL*   Lipid Panel    Component Value Date/Time   CHOL 200 08/11/2012 0807   TRIG 76 08/11/2012 0807   HDL 78 08/11/2012 0807   CHOLHDL 2.6 08/11/2012 0807   VLDL 15 08/11/2012 0807   LDLCALC 107* 08/11/2012 0807   HgbA1C  Lab Results  Component Value Date   HGBA1C 5.8* 08/11/2012    Urine Drug Screen:   No  results found for this basename: labopia,  cocainscrnur,  labbenz,  amphetmu,  thcu,  labbarb    Alcohol Level: No results found for this basename: ETH:2 in the last 168 hours  No results found.  CT of the brain  No acute intracranial abnormality.  MRI of the brain  5 mm area of acute infarct in the right occipital  MRA of the brain  No major vessel occlusion or correctable proximal stenosis.  2D Echocardiogram  65-70% EF.  no source of embolus identified.  Carotid Doppler  08/10/2012 No evidence of hemodynamically significant internal carotid artery stenosis. Vertebral artery flow is antegrade.   CXR    Physical Exam  Mental Status: Alert, oriented, thought content  appropriate.  Speech fluent without evidence of aphasia.  Able to follow 3 step commands without difficulty. Cranial Nerves: II: Discs flat bilaterally; Visual fields grossly normal, pupils equal, round, reactive to light and accommodation III,IV, VI: ptosis not present, extra-ocular motions intact bilaterally V,VII: smile symmetric, facial light touch sensation normal bilaterally VIII: hearing normal bilaterally IX,X: gag reflex present XI: bilateral shoulder shrug XII: midline tongue extension Motor: Right : Upper extremity   5/5    Left:     Upper extremity   5/5  Lower extremity   5/5     Lower extremity   5/5 Tone and bulk:normal tone throughout; no atrophy noted Sensory: Pinprick and light touch intact throughout, bilaterally Deep Tendon Reflexes: 1+ and symmetric throughout Plantars:  Right: downgoing Left: downgoing Cerebellar: normal finger-to-nose, normal rapid alternating movements and normal heel-to-shin test Gait: not accessed.  CV: pulses palpable throughout    ASSESSMENT Patient presented with agitation and confusion. She was found to have UTI on 12/29. CT scan was worrisome for CVA of indetermined age. Pt was started on antibiotics and her mental status improved.  She then had acute mental status worsening. MRI brain now confirms  acute infarct in the right occipital region and incidental finding not related to mental status change. Etiology likely small vessel disease. Pt not on antiplatelet prior to admission. Now on aspirin 325 mg daily. Work up completed. Patient with no resultant neuro deficits, no visual field abnormalities.   Hyperlipidemia, LDL 107, not on statin PTA, now on zocor, goal LDL < 100 HgbA1c 5.8  Hospital day # 9  TREATMENT/PLAN  Continue ASA 325 mg daily for secondary stroke prevention  Continue PT/OT. disposition back to independent living with home health vs short term SNF. Therapy needs not related to stroke Stroke Service will sign off.  Follow up with Dr. Pearlean Brownie, Stroke [facility], in 2 months.  Annie Main, MSN, RN, ANVP-BC, ANP-BC, [facility] Pager: [phone] 08/18/2012 10:09 AM  I have personally obtained a history, examined the patient, evaluated imaging results, and formulated the assessment and plan of care. I agree with the above. Delia Heady, MD Medical Director [facility] Pager: [phone] 08/18/2012 1:06 PM

## 2012-08-18 NOTE — Progress Notes (Signed)
Occupational Therapy Treatment Patient Details Name: Margaret [name] MRN: [id number] DOB: 1923-11-29 Today's Date: 08/18/2012 Time: 4010-2725 OT Time Calculation (min): 38 min  OT Assessment / Plan / Recommendation    Follow Up Recommendations  SNF       Equipment Recommendations  None recommended by OT       Frequency Min 2X/week   Plan Discharge plan remains appropriate    Precautions / Restrictions Precautions Precautions: Fall Restrictions Weight Bearing Restrictions: No       ADL  Grooming: Performed;Min guard Where Assessed - Grooming: Unsupported standing Toilet Transfer: Performed;Min guard Toilet Transfer Method: Sit to Barista: Comfort height toilet Toileting - Clothing Manipulation and Hygiene: Performed;Min guard ADL Comments: Lengthy discussion with pt and daugther regarding DC plans . Feel pt will benefit from ST SNF to increase I with ADL activity and return to PLOF      OT Goals ADL Goals Pt Will Perform Grooming: with modified independence;Standing at sink ADL Goal: Grooming - Progress: Goal set today Pt Will Transfer to Toilet: with modified independence ADL Goal: Toilet Transfer - Progress: Goal set today Pt Will Perform Toileting - Clothing Manipulation: with modified independence ADL Goal: Toileting - Clothing Manipulation - Progress: Goal set today Miscellaneous OT Goals OT Goal: Miscellaneous Goal #1 - Progress: Met OT Goal: Miscellaneous Goal #2 - Progress: Met  Visit Information  Last OT Received On: 08/18/12    Subjective Data  Subjective: I think i should go where i have some help      Cognition  Overall Cognitive Status: Appears within functional limits for tasks assessed/performed    Mobility  Shoulder Instructions Bed Mobility Bed Mobility: Supine to Sit Rolling Right: 5: Supervision;With rail Transfers Transfers: Sit to Stand;Stand to Sit Sit to Stand: 4: Min guard;With upper extremity assist Stand  to Sit: 4: Min guard;With upper extremity assist             End of Session OT - End of Session Activity Tolerance: Patient tolerated treatment well Patient left: in chair;with family/visitor present;with call bell/phone within reach  GO     Prezley Qadir, [place]ung 08/18/2012, 12:17 PM

## 2012-08-18 NOTE — Progress Notes (Signed)
Physical Therapy Treatment Patient Details Name: Margaret [name] MRN: [id number] DOB: 1924/06/13 Today's Date: 08/18/2012 Time: 3474-2595 PT Time Calculation (min): 17 min  PT Assessment / Plan / Recommendation Comments on Treatment Session  Pt is an 79 yo female presenting with generalized deconditioning, mild L sided weakness, and balance impairments requiring RW for safe ambulation. patient remains unsafe to return home alone and would benefit from SNF placement upon d/c to achieve safe independent function prior to transition home.    Follow Up Recommendations  SNF;Supervision/Assistance - 24 hour     Does the patient have the potential to tolerate intense rehabilitation     Barriers to Discharge        Equipment Recommendations       Recommendations for Other Services    Frequency Min 3X/week   Plan Discharge plan remains appropriate    Precautions / Restrictions Precautions Precautions: Fall Restrictions Weight Bearing Restrictions: No   Pertinent Vitals/Pain 0/10    Mobility  Bed Mobility Bed Mobility: Supine to Sit Rolling Right: 5: Supervision;With rail Transfers Transfers: Sit to Stand;Stand to Sit Sit to Stand: 4: Min guard;With upper extremity assist Stand to Sit: 4: Min guard;With upper extremity assist Details for Transfer Assistance: increased time required Ambulation/Gait Ambulation/Gait Assistance: 4: Min assist Ambulation Distance (Feet): 100 Feet (x2 with seated rest break in between) Assistive device: Rolling walker Ambulation/Gait Assistance Details: pt with onset of fatigue, mild SOB. pt c/o my legs just feel weak Gait Pattern: Step-through pattern;Decreased stride length;Narrow base of support Gait velocity: slow General Gait Details: v/c's to increase base of support Stairs: No Modified Rankin (Stroke Patients Only) Pre-Morbid Rankin Score: No symptoms Modified Rankin: Severe disability    Exercises     PT Diagnosis:    PT Problem List:    PT Treatment Interventions:     PT Goals Acute Rehab PT Goals PT Goal: Supine/Side to Sit - Progress: Progressing toward goal PT Goal: Sit to Stand - Progress: Progressing toward goal PT Goal: Stand to Sit - Progress: Progressing toward goal PT Goal: Ambulate - Progress: Progressing toward goal  Visit Information  Last PT Received On: 08/18/12 Assistance Needed: +1    Subjective Data  Subjective: Pt received supine in bed with c/o "I just feel weak."   Cognition  Overall Cognitive Status: Appears within functional limits for tasks assessed/performed Arousal/Alertness: Awake/alert Orientation Level: Appears intact for tasks assessed Behavior During Session: Flat affect    Balance     End of Session PT - End of Session Equipment Utilized During Treatment: Gait belt Activity Tolerance: Patient limited by fatigue Patient left: in chair;with chair alarm set;with nursing in room;with call bell/phone within reach Nurse Communication: Mobility status   GP     Marcene Brawn 08/18/2012, 2:42 PM  Lewis Shock, PT, DPT Pager #: [phone] Office #: [phone]

## 2012-08-18 NOTE — Progress Notes (Signed)
[facility] PROGRESS NOTE  Margaret [name] AVW:[id number] DOB: Dec 12, 1923 DOA: 08/09/2012 PCP: Michele Mcalpine, MD  Assessment/Plan: 1. Acute CVA: MRI confirms acute CVA of the right occipital lobe. Etiology looks to be small vessel disease and not embolic. Statin started and aspirin increased to 325. Patient had a partial stroke workup on admission for altered mental status. At that time echocardiogram and carotid Dopplers unremarkable. This may be arrhythmia versus small vessel disease. Patient's also had elevated blood pressures as well. Appreciate neurology help. Aspirin increased to 325 mg plus that was started. Carotid Doppler and echocardiogram are done earlier were unremarkable. A1c lipid profile also unimpressive. Appreciate speech therapy help. Diet adjusted. 2. UTI (kleb PNA)- completed antibiotics. 3. Hyponatremia- resolved. Felt to be secondary to some volume overload 4. AMS due to UTI (acute metabolic encephalopathy)-and then from CVA. Back to near baseline Dysphagia- speech therapy has seen and readjusted. See below. ARF: Mild overdiuresis. Discontinued Lasix and started gentle IV fluids, continuing to improve 5. Senile dementia-mild with acute delirium brought on by CVA: Much closer to baseline today. Very interactive and appropriate. Will get speech and PT to reassess to make final determinations. Suspect patient will need short-term skilled nursing Acute on chronic grade 1 diastolic heart failure: Status post diuresis. See above. Slight overdiuresis. 6. Hyperlipidemia: Noted to have an LDL of 107.    Code Status: DNR Family Communication: Spoke with daughter over the phone. 7. Disposition Plan: likely will need short-term skilled nursing before looking at lower level of care.    Consultants:  Neurology  Procedures:  None  Antibiotics: Complete a seven-day course of Rocephin  HPI/Subjective: Patient continues to be more alert and oriented. No complaints. Speech  more appropriate   Objective: Filed Vitals:   08/17/12 1739 08/17/12 2137 08/18/12 0208 08/18/12 0440  BP: 107/51 140/69 158/62 154/64  Pulse: 71 74 73 90  Temp: 97.8 F (36.6 C) 98 F (36.7 C) 98.5 F (36.9 C) 97.6 F (36.4 C)  TempSrc:  Oral Oral Oral  Resp: 20 16 18 18   Height:      Weight:      SpO2: 98% 98% 99% 99%    Intake/Output Summary (Last 24 hours) at 08/18/12 1007 Last data filed at 08/18/12 0749  Gross per 24 hour  Intake    360 ml  Output      0 ml  Net    360 ml   Filed Weights   08/10/12 1443  Weight: 53.3 kg (117 lb 8.1 oz)    Exam:   General:  Pleasant, alert and oriented, interactive  Cardiovascular: Regular rate and rhythm, S1-S2 occasional ectopic beat  Respiratory: Clear to auscultation bilaterally  Abdomen: +BS, soft, NT/ND  Extremities: No edema  Neurological exam: nonspecific weakness. Some mild facial droop on the right  Data Reviewed: Basic Metabolic Panel:  Lab 08/18/12 8295 08/17/12 0810 08/16/12 0740 08/15/12 0645 08/14/12 1134  NA 133* 132* 132* 134* 132*  K 3.5 3.4* 3.4* 3.4* 3.6  CL 95* 92* 90* 90* 92*  CO2 29 29 29 31 30   GLUCOSE 131* 121* 98 110* 121*  BUN 29* 35* 26* 11 5*  CREATININE 0.97 1.76* 1.88* 0.78 0.47*  CALCIUM 8.9 9.5 9.7 9.9 9.7  MG -- -- -- -- --  PHOS -- -- -- -- --   Liver Function Tests:  Lab 08/14/12 1134  AST 30  ALT 16  ALKPHOS 68  BILITOT 0.3  PROT 7.2  ALBUMIN 3.8   CBC:  Lab 08/14/12 1134 08/12/12 0645  WBC 4.3 4.1  NEUTROABS -- --  HGB 11.3* 9.0*  HCT 32.3* 25.5*  MCV 86.8 87.3  PLT 177 129*   Cardiac Enzymes:  Lab 08/15/12 1209  CKTOTAL --  CKMB --  CKMBINDEX --  TROPONINI <0.30   CBG:  Lab 08/15/12 1510 08/11/12 1623 08/11/12 1128  GLUCAP 108* 165* 127*    Recent Results (from the past 240 hour(s))  URINE CULTURE     Status: Normal   Collection Time   08/10/12 12:06 AM      Component Value Range Status Comment   Specimen Description URINE, CATHETERIZED    Final    Special Requests NONE   Final    Culture  Setup Time 08/10/2012 11:05   Final    Colony Count >=100,000 COLONIES/ML   Final    Culture KLEBSIELLA PNEUMONIAE   Final    Report Status 08/11/2012 FINAL   Final    Organism ID, Bacteria KLEBSIELLA PNEUMONIAE   Final   CULTURE, BLOOD (ROUTINE X 2)     Status: Normal   Collection Time   08/10/12  2:15 PM      Component Value Range Status Comment   Specimen Description BLOOD RIGHT ARM   Final    Special Requests BOTTLES DRAWN AEROBIC AND ANAEROBIC 10CC   Final    Culture  Setup Time 08/10/2012 18:44   Final    Culture NO GROWTH 5 DAYS   Final    Report Status 08/16/2012 FINAL   Final   CULTURE, BLOOD (ROUTINE X 2)     Status: Normal   Collection Time   08/10/12  2:25 PM      Component Value Range Status Comment   Specimen Description BLOOD RIGHT ARM   Final    Special Requests BOTTLES DRAWN AEROBIC AND ANAEROBIC 10CC   Final    Culture  Setup Time 08/10/2012 18:44   Final    Culture NO GROWTH 5 DAYS   Final    Report Status 08/16/2012 FINAL   Final      Studies: Ct Head Wo Contrast  08/14/2012     IMPRESSION: Stable. No acute intracranial abnormality.   Original Report Authenticated By: Erskine Speed, M.D.     Scheduled Meds:    . aspirin  81 mg Oral Daily  . ciprofloxacin  1 drop Both Eyes Q4H while awake  . feeding supplement  237 mL Oral Daily  . levothyroxine  75 mcg Oral QAC breakfast  . pantoprazole  40 mg Oral Daily  . pindolol  5 mg Oral BID  . polyethylene glycol  17 g Oral Daily  . senna-docusate  1 tablet Oral BID   Continuous Infusions:    . sodium chloride 50 mL/hr at 08/18/12 0101    Active Problems:  ANXIETY  HYPERTENSION  CONSTIPATION  Hypothyroidism  UTI (lower urinary tract infection)  Hyponatremia  Dysphagia  CVA (cerebral infarction)  Confusion  ARF (acute renal failure)    Time spent: 20 minutes    Hollice Espy  [facility] Pager [phone] If 8PM-8AM, please contact  night-coverage at [email or web address], password [facility] 08/18/2012, 10:07 AM  LOS: 9 days

## 2012-08-19 DIAGNOSIS — I509 Heart failure, unspecified: Secondary | ICD-10-CM

## 2012-08-19 DIAGNOSIS — I5032 Chronic diastolic (congestive) heart failure: Secondary | ICD-10-CM

## 2012-08-19 LAB — BASIC METABOLIC PANEL
BUN: 23 mg/dL (ref 6–23)
CO2: 31 mEq/L (ref 19–32)
Calcium: 9.3 mg/dL (ref 8.4–10.5)
Creatinine, Ser: 0.8 mg/dL (ref 0.50–1.10)
Glucose, Bld: 96 mg/dL (ref 70–99)

## 2012-08-19 MED ORDER — LISINOPRIL 2.5 MG PO TABS: 2.5000 mg | ORAL_TABLET | Freq: Every day | ORAL | Status: DC

## 2012-08-19 MED ORDER — SIMVASTATIN 20 MG PO TABS: 20.0000 mg | ORAL_TABLET | Freq: Every day | ORAL | Status: DC

## 2012-08-19 MED ORDER — ASPIRIN EC 325 MG PO TBEC: 325.0000 mg | DELAYED_RELEASE_TABLET | Freq: Every day | ORAL | Status: DC

## 2012-08-19 NOTE — Clinical Social Work Psychosocial (Signed)
     Clinical Social Work Department BRIEF PSYCHOSOCIAL ASSESSMENT 08/19/2012  Patient:  Margaret [name], Margaret [name]     Account Number:  [id number]     Admit date:  08/09/2012  Clinical Social Worker:  Peggyann Shoals  Date/Time:  08/18/2012 05:00 PM  Referred by:  Physician  Date Referred:  08/18/2012 Referred for  SNF Placement   Other Referral:   Interview type:  Family Other interview type:    PSYCHOSOCIAL DATA Living Status:  ALONE Admitted from facility:   Level of care:   Primary support name:  Charlestine Night Primary support relationship to patient:  CHILD, ADULT Degree of support available:   very supportive  [phone]    CURRENT CONCERNS Current Concerns  Post-Acute Placement   Other Concerns:    SOCIAL WORK ASSESSMENT / PLAN CSW met with pt's daughter to address consult. CSW introduced herself and explained role of social work. CSW also explained the process of discharging to SNF when pt has used all of her Medicare days.    Pt lives alone in an independent living apartment at [place]. Pt was a resident of [facility] for short term rehab, where she stayed for 3 months. Pt discharged from SNF and moved into apt, 3 weeks later. Pt was admitted to [facility], where the discharge recommendation is SNF.    Pt has not had a 60 day wellness stay and will likely have to pay privately. Pt's daughter would like a SNF search completed to know options. CSW also explained that if pt and family are unable to pay privately, then pt will need to discharge home with home health services.    CSW will initiate SNF search and  follow up with bed offers. CSW will also follow up with [facility]. CSW will continue to follow.   Assessment/plan status:  Psychosocial Support/Ongoing Assessment of Needs Other assessment/ plan:   Information/referral to community resources:   SNF list    PATIENTS/FAMILYS RESPONSE TO PLAN OF CARE: Pt's daughter is very pleasant.

## 2012-08-19 NOTE — Discharge Summary (Addendum)
Physician Discharge Summary  Margaret [name] OZH:[id number] DOB: 23-Apr-1924 DOA: 08/09/2012  PCP: Michele Mcalpine, MD  Admit date: 08/09/2012 Discharge date: 08/19/2012  Time spent: 40 minutes  Recommendations for Outpatient Follow-up:  1. Patient is being discharged to  short-term skilled nursing 2. She'll followup with neurology in 2-3 months 3. She will make an appointment with establish with [facility] as she is a new diagnosis of grade 1 diastolic heart failure 4. She'll followup with her primary care physician in the next month  Discharge Diagnoses:   Active Problems:  ANXIETY  HYPERTENSION  CONSTIPATION  Hypothyroidism  UTI (lower urinary tract infection)  Hyponatremia  Dysphagia  CVA (cerebral infarction)  Confusion  ARF (acute renal failure)  Chronic diastolic CHF (congestive heart failure)   Discharge Condition: Improved, being discharged to independent living with full home health services including RN, PT, OT, speech language pathology, social work and aide  Diet recommendation: Carb modified heart healthy  Filed Weights   08/10/12 1443  Weight: 53.3 kg (117 lb 8.1 oz)    History of present illness:  79 year old white female with past mental history of hypertension and hypothyroidism presented on 08/10/12 and she was moving into her independent living facility when she started acting confused. She was evaluated in the emergency room and found to have a urinary tract infection, be in moderate hyponatremia and her CT scan noted a questionable CVA. In the emergency room she started getting more agitated and combative requiring Benadryl and Ativan. Hospitalists were called for further evaluation and admission.  Hospital Course:   CVA (cerebral infarction)-principal problem: During initial workup, patient actually did not have a stroke. CT and MRI were negative. Her initial agitation was felt to be secondary to UTI. See below. Patient was improving and was set to  be discharged on 1/2 when she started becoming quite confused again. This time she was quite agitated and delirious. Vital signs were relatively equivocal with a mild increase in her blood pressure. Patient had full electrolytes panel, renal function, liver function tests, ABG and CBC all of which were normal. CT scan is unremarkable with symptoms persisting, an MRI was repeated on 1/4 inch was found to have n acute right occipital infarct. Neurology was consulted. Patient already had done an echocardiogram and carotid Dopplers a few days prior for initial stroke workup. This was felt to be secondary to small vessel disease. Patient was started on a statin and her aspirin was increased from 81-325 mg. Speech therapy had already adjusted her diet from her first presentation but with her stroke, patient's diet was just began to dysphasia 2 with fine chopped meats and thin liquids. She was evaluated by physical therapy who recommended short-term skilled nursing,   Active Problems:  ANXIETY: Patient looked to be excessively fearful during her episodes of delirium. She'll continue on her home dose of when necessary low-dose Xanax.   HYPERTENSION: Patient will resume her dose of blocker. Because of diagnosis of grade 1 diastolic heart failure, low-dose enalapril was added.   CONSTIPATION: Noted on admission. Patient received Lasix and had large bowel movement the emergency room. She did not have further problems with this.   Hypothyroidism: Stable. Continue on Synthroid. TSH was checked of abnormal 1.54 during her initial workup.   UTI (lower urinary tract infection): Patient's initial workup for agitation she was found have a UTI. This grew out Klebsiella pneumonia which was pansensitive. Patient completed 7 days of IV Rocephin   Hyponatremia: Patient's initial sodium on  admission was noted to be 121. As of the second of mild dehydration. Patient received gentle fluids and by 1/1 her sodium was back up to  131. This corresponded with improvement in her mentation.   Dysphagia: See above. During patient's initial confusion from low sodium and urinary tract infection, she does notice some mild dysphasia and put on a dysphagia 1 diet following her acute CVA, her diet was downgraded to a dysphagia 2 diet with thin liquids.   Confusion: Acute delirium initially felt to be secondary to UTI plus hyponatremia and once these were treated improved. It returned when she had her CVA and has since much improved where she is now close to her baseline.   ARF (acute renal failure): Patient during initial stroke workup had an echocardiogram which noted grade 1 diastolic dysfunction. She did not look to be volume overloaded. On 1/2 when the patient started having acute delirium, a BNP was ordered and patient was noted to be mildly elevated at 1779. She was started on some IV Lasix and diuresed (total volume depletion of about 9 L).  Her creatinine started to trend up by 1/4 in her urine output significantly declined. At this point her Lasix was discontinued and her she received very low dose IV fluids. By day of discharge, her creatinine is stable at 0.8.   Chronic diastolic CHF (congestive heart failure): See above. Patient diuresed about 9 L. This is a new diagnosis. She received education for this. On discharge his Ardee on a beta blocker and so enalapril has been added as well. She received an information booklet and she will also follow up with [facility]ology.   Procedures:  Echocardiogram done 12/30: Grade 1 diastolic dysfunction, preserved ejection fraction  Carotid Dopplers done 12/29: No evidence of significant stenoses  Consultations:  Neurology/stroke service  Discharge Exam: Filed Vitals:   08/18/12 2109 08/19/12 0149 08/19/12 0700 08/19/12 1000  BP: 133/69 133/66 176/63 124/59  Pulse: 79 80 88 85  Temp: 98.3 F (36.8 C) 97.7 F (36.5 C) 97.4 F (36.3 C) 97.2 F (36.2 C)  TempSrc: Oral Oral  Oral Oral  Resp: 18 16 16 18   Height:      Weight:      SpO2: 100% 96% 99% 97%    General: Alert and oriented x2, no acute distress, fatigued Cardiovascular: Regular rate and rhythm, S1-S2, 2/6 systolic ejection murmur Respiratory: Clear to auscultation bilaterally Abdomen: Soft, nontender, nondistended, positive bowel sounds Extremities: No clubbing or cyanosis or edema  Discharge Instructions  Discharge Orders    Future Appointments: Provider: Department: Dept Phone: Center:   09/03/2012 12:00 PM Michele Mcalpine, MD  Pulmonary Care [phone] None     Future Orders Please Complete By Expires   Diet - low sodium heart healthy      Increase activity slowly      Walk with assistance          Medication List     As of 08/19/2012 10:29 AM    STOP taking these medications         aspirin 81 MG tablet      TAKE these medications         ALPRAZolam 0.25 MG tablet   Commonly known as: XANAX   Take 0.125 mg by mouth every 6 (six) hours as needed. For anxiety      aspirin EC 325 MG tablet   Take 1 tablet (325 mg total) by mouth daily.      azithromycin  1 % ophthalmic solution   Commonly known as: AZASITE   Place 1 drop into both eyes every evening.      cyanocobalamin 1000 MCG tablet   Take 1,000 mcg by mouth every morning.      feeding supplement Liqd   Take 237 mLs by mouth daily.      lansoprazole 30 MG capsule   Commonly known as: PREVACID   Take 30 mg by mouth every morning.      levothyroxine 75 MCG tablet   Commonly known as: SYNTHROID, LEVOTHROID   Take 75 mcg by mouth every morning.      lisinopril 2.5 MG tablet   Commonly known as: PRINIVIL,ZESTRIL   Take 1 tablet (2.5 mg total) by mouth daily.      methylcellulose 1 % ophthalmic solution   Commonly known as: ARTIFICIAL TEARS   Place 1 drop into both eyes as needed.      mineral oil liquid   Take 30 mLs by mouth daily as needed. For constipation      pindolol 5 MG tablet   Commonly known  as: VISKEN   Take 5 mg by mouth 2 (two) times daily.      polyethylene glycol powder powder   Commonly known as: GLYCOLAX/MIRALAX   Take 17 g by mouth daily.      senna-docusate 8.6-50 MG per tablet   Commonly known as: Senokot-S   Take 1 tablet by mouth 2 (two) times daily.      simvastatin 20 MG tablet   Commonly known as: ZOCOR   Take 1 tablet (20 mg total) by mouth daily at 6 PM.      vitamin D (CHOLECALCIFEROL) 400 UNITS tablet   Take 400 Units by mouth every 7 (seven) days. On Thursdays           Follow-up Information    Follow up with SETHI,PRAMODKUMAR P, MD. In 2 months. (stroke clinic)    Contact information:   [address] [facility] [place] [address] [phone]       Follow up with NADEL,SCOTT M, MD. Schedule an appointment as soon as possible for a visit in 2 weeks.   Contact information:   [facility] [address] [place] [address] [phone]       Follow up with [facility] Main [facility]). Schedule an appointment as soon as possible for a visit in 1 month.   Contact information:   [address] [place] [address] [phone]          The results of significant diagnostics from this hospitalization (including imaging, microbiology, ancillary and laboratory) are listed below for reference.    Significant Diagnostic Studies: Dg Chest 2 View  08/10/2012    IMPRESSION: No acute cardiopulmonary abnormality seen.   Original Report Authenticated By: Lupita Raider.,  M.D.    Ct Head Wo Contrast  08/14/2012  Dominant distal left vertebral artery. No suspicious intracranial vascular hyperdensity.   IMPRESSION: Stable. No acute intracranial abnormality.   Original Report Authenticated By: Erskine Speed, M.D.    Ct Head Wo Contrast  08/10/2012 .  IMPRESSION:  1.  Vaguely decreased attenuation at the inferior left frontal lobe may reflect a small infarct  of indeterminate age.  No evidence of hemorrhagic transformation. 2.  Diffuse small vessel ischemic microangiopathy and chronic ischemic change at the external capsule bilaterally. 3.  Mild partial opacification of the left  maxillary sinus.   Original Report Authenticated By: Tonia Ghent, M.D.    Mr Brain Wo Contrast  08/10/2012   IMPRESSION: No acute finding.  Advanced widespread chronic small vessel disease throughout the brain.   08/16/2012    IMPRESSION: 5 mm area of acute infarct in the right occipital white matter.  Moderate to advanced chronic microvascular ischemia.   Original Report Authenticated By: Janeece Riggers, M.D.    Mr Mra Head/brain Wo Cm  08/10/2012  IMPRESSION: No major vessel occlusion or correctable proximal stenosis.  Distal vessel atherosclerotic irregularity.   Original Report Authenticated By: Paulina Fusi, M.D.     Microbiology: Recent Results (from the past 240 hour(s))  URINE CULTURE     Status: Normal   Collection Time   08/10/12 12:06 AM      Component Value Range Status Comment   Specimen Description URINE, CATHETERIZED   Final    Special Requests NONE   Final    Culture  Setup Time 08/10/2012 11:05   Final    Colony Count >=100,000 COLONIES/ML   Final    Culture KLEBSIELLA PNEUMONIAE   Final    Report Status 08/11/2012 FINAL   Final    Organism ID, Bacteria KLEBSIELLA PNEUMONIAE   Final   CULTURE, BLOOD (ROUTINE X 2)     Status: Normal   Collection Time   08/10/12  2:15 PM      Component Value Range Status Comment   Specimen Description BLOOD RIGHT ARM   Final    Special Requests BOTTLES DRAWN AEROBIC AND ANAEROBIC 10CC   Final    Culture  Setup Time 08/10/2012 18:44   Final    Culture NO GROWTH 5 DAYS   Final    Report Status 08/16/2012 FINAL   Final   CULTURE, BLOOD (ROUTINE X 2)     Status: Normal   Collection Time   08/10/12  2:25 PM      Component Value Range Status Comment   Specimen Description BLOOD RIGHT ARM   Final    Special Requests  BOTTLES DRAWN AEROBIC AND ANAEROBIC 10CC   Final    Culture  Setup Time 08/10/2012 18:44   Final    Culture NO GROWTH 5 DAYS   Final    Report Status 08/16/2012 FINAL   Final      Labs: Basic Metabolic Panel:  Lab 08/19/12 1610 08/18/12 0800 08/17/12 0810 08/16/12 0740 08/15/12 0645  NA 133* 133* 132* 132* 134*  K 3.8 3.5 3.4* 3.4* 3.4*  CL 96 95* 92* 90* 90*  CO2 31 29 29 29 31   GLUCOSE 96 131* 121* 98 110*  BUN 23 29* 35* 26* 11  CREATININE 0.80 0.97 1.76* 1.88* 0.78  CALCIUM 9.3 8.9 9.5 9.7 9.9  MG -- -- -- -- --  PHOS -- -- -- -- --   Liver Function Tests:  Lab 08/14/12 1134  AST 30  ALT 16  ALKPHOS 68  BILITOT 0.3  PROT 7.2  ALBUMIN 3.8   CBC:  Lab 08/14/12 1134  WBC 4.3  NEUTROABS --  HGB 11.3*  HCT 32.3*  MCV 86.8  PLT 177   Cardiac Enzymes:  Lab 08/15/12 1209  CKTOTAL --  CKMB --  CKMBINDEX --  TROPONINI <0.30   BNP: BNP (last 3 results)  Basename 08/14/12 1134  PROBNP 1779.0*   CBG:  Lab 08/15/12 1510  GLUCAP 108*       Signed:  Khamille Beynon K  [facility] 08/19/2012, 10:29 AM

## 2012-08-19 NOTE — Clinical Social Work Placement (Signed)
     Clinical Social Work Department CLINICAL SOCIAL WORK PLACEMENT NOTE 08/19/2012  Patient:  Margaret [name], Margaret [name]  Account Number:  [id number] Admit date:  08/09/2012  Clinical Social Worker:  Peggyann Shoals  Date/time:  08/19/2011 05:00 PM  Clinical Social Work is seeking post-discharge placement for this patient at the following level of care:   SKILLED NURSING   (*CSW will update this form in Epic as items are completed)   08/18/2012  Patient/family provided with [facility] Department of Clinical Social Works list of facilities offering this level of care within the geographic area requested by the patient (or if unable, by the patients family).  08/18/2012  Patient/family informed of their freedom to choose among providers that offer the needed level of care, that participate in Medicare, Medicaid or managed care program needed by the patient, have an available bed and are willing to accept the patient.  08/18/2012  Patient/family informed of [facility] ownership interest in [facility], as well as of the fact that they are under no obligation to receive care at this facility.  PASARR submitted to EDS on 08/19/2012 PASARR number received from EDS on 08/19/2012  FL2 transmitted to all facilities in geographic area requested by pt/family on  08/19/2012 FL2 transmitted to all facilities within larger geographic area on   Patient informed that his/her managed care company has contracts with or will negotiate with  certain facilities, including the following:     Patient/family informed of bed offers received:   Patient chooses bed at  Physician recommends and patient chooses bed at    Patient to be transferred to  on   Patient to be transferred to facility by   The following physician request were entered in Epic:   Additional Comments:

## 2012-08-19 NOTE — Progress Notes (Signed)
Physical Therapy Treatment Patient Details Name: Margaret [name] MRN: [id number] DOB: [birth year] Today's Date: 08/19/2012 Time: 4696-2952 PT Time Calculation (min): 31 min  PT Assessment / Plan / Recommendation Comments on Treatment Session  Patient now may need to transition to independent living facility due to insurance limitations on SNF stay at this time.  Feel she will need maximal HH services offered and may need meals on wheels if they can cater to her soft diet needs.  Continues with limited activity tolerance, decreased safety and limited initiation which may lead to quick readmission.    Follow Up Recommendations  Home health PT;Supervision/Assistance - 24 hour                 Equipment Recommendations  None recommended by PT        Frequency Min 3X/week   Plan Discharge plan needs to be updated    Precautions / Restrictions Precautions Precautions: Fall   Pertinent Vitals/Pain Denies initially, then c/o left ankle pain with ambulation    Mobility  Bed Mobility Supine to Sit: 5: Supervision Sitting - Scoot to Edge of Bed: 5: Supervision Sit to Supine: 5: Supervision Transfers Sit to Stand: 5: Supervision;With upper extremity assist;From bed;From chair/3-in-1;4: Min guard Stand to Sit: 5: Supervision;4: Min guard;To chair/3-in-1;To bed Details for Transfer Assistance: supervision mainly during multiple transitions from bed, 3:1 and during two sessions of ambulation, occasional minguard for safety Ambulation/Gait Ambulation/Gait Assistance: 5: Supervision;4: Min guard Ambulation Distance (Feet): 150 Feet (x 2 with seated rest in between) Assistive device: Rolling walker Ambulation/Gait Assistance Details: cues needed for path negotiation, occasional minguard on turns for directionality.  Min c/o pain left ankle at end of session Gait Pattern: Step-through pattern;Decreased stride length;Trunk flexed    Exercises Other Exercises Other Exercises: Educated patient  and daughter on fall prevention techniques for home including foot wear, lighting, storage of frequently used items at shoulder to hip height, clothing and bed linen kept out of way of feet, use of RW at all times and tacking down any throw rugs.    PT Goals Acute Rehab PT Goals Pt will go Supine/Side to Sit: with modified independence PT Goal: Supine/Side to Sit - Progress: Updated due to goal met Pt will go Sit to Supine/Side: with modified independence PT Goal: Sit to Supine/Side - Progress: Updated due to goal met Pt will go Sit to Stand: with modified independence PT Goal: Sit to Stand - Progress: Progressing toward goal Pt will go Stand to Sit: with modified independence PT Goal: Stand to Sit - Progress: Progressing toward goal Pt will Ambulate: 51 - 150 feet;with least restrictive assistive device;with modified independence PT Goal: Ambulate - Progress: Progressing toward goal  Visit Information  Last PT Received On: 08/19/13    Subjective Data  Subjective: Per daughter "She just keeps getting shuffled around.  I hope she can stabilize at the [facility] if that's our only option."   Cognition  Overall Cognitive Status: Appears within functional limits for tasks assessed/performed Arousal/Alertness: Awake/alert Behavior During Session: [facility] for tasks performed    Balance  Static Standing Balance Static Standing - Balance Support: No upper extremity supported Static Standing - Level of Assistance: 5: Stand by assistance Static Standing - Comment/# of Minutes: while washing hands after toileting  End of Session PT - End of Session Equipment Utilized During Treatment: Gait belt Activity Tolerance: Patient tolerated treatment well Patient left: in bed;with call bell/phone within reach;with family/visitor present   GP  Margaret [name],Margaret [name] 08/19/2012, 5:12 PM Margaret [name], PT [phone] 08/19/2012

## 2012-08-19 NOTE — Clinical Social Work Note (Signed)
Clinical Social Work   CSW informed pt's daughter that in order for pt to go to [facility] the balance would need to be paid off. Pt's daughter opted for pt to return home with home health services. Pt's daughter would like for pt to be discharged tomorrow due to time of day and weather. CSW updated RNCM and MD. CSW is signing off, as no further needs identified.   Dede Query, MSW, [facility] Majors [phone]

## 2012-08-19 NOTE — Clinical Social Work Note (Signed)
Clinical Social Work   CSW and RNCM met with pt address discharge plan. Pt's daughter shared that did not feel that pt should be discharged home and then go to [facility] tomorrow, as it would be a lot of moving for pt. Pt's daughter feels that pt will be best cared for at SNF, and unable to take care of pt at home.  At time of note, [facility] will not have a bed until 08/20/2012 and facility questioned if pt had any Medicare days left. CSW left a message requesting a return phone call. CSW will update family of bed offers. CSW will continue to follow.   Dede Query, MSW, [place] [facility]ors [phone]

## 2012-08-20 LAB — BASIC METABOLIC PANEL
CO2: 31 mEq/L (ref 19–32)
Calcium: 9.5 mg/dL (ref 8.4–10.5)
Creatinine, Ser: 0.65 mg/dL (ref 0.50–1.10)
GFR calc non Af Amer: 78 mL/min — ABNORMAL LOW (ref >90)

## 2012-08-20 NOTE — Progress Notes (Signed)
Physical Therapy Treatment Patient Details Name: Margaret [name] MRN: [id number] DOB: 23-Jan-1924 Today's Date: 08/11/2012 Time: 4098-1191 PT Time Calculation (min): 19 min  PT Assessment / Plan / Recommendation Comments on Treatment Session  Pt making good progress with mobility & PT goals at this date.  Ambulated 74' with RW.  Very pleasant & willing to participate.  PT goals updated at this date & gait goal added.       Follow Up Recommendations  SNF;Supervision/Assistance - 24 hour     Does the patient have the potential to tolerate intense rehabilitation     Barriers to Discharge        Equipment Recommendations  Other (comment) (TBD)    Recommendations for Other Services    Frequency Min 4X/week   Plan      Precautions / Restrictions Precautions Precautions: Fall Restrictions Weight Bearing Restrictions: No       Mobility  Bed Mobility Bed Mobility: Supine to Sit;Sitting - Scoot to Edge of Bed Supine to Sit: 4: Min guard Sitting - Scoot to [place] of Bed: 4: Min guard Details for Bed Mobility Assistance: no physical (A) needed.  Cues for technique.   Transfers Transfers: Sit to Stand;Stand to Sit Sit to Stand: 4: Min assist Stand to Sit: 4: Min assist Details for Transfer Assistance: cues for technique.  (A) for balance & safety.   Ambulation/Gait Ambulation/Gait Assistance: 4: Min assist Ambulation Distance (Feet): 60 Feet Assistive device: Rolling walker Ambulation/Gait Assistance Details: (A) for balance & safety.  Cues for RW management & safe body positioning inside RW.   Gait Pattern: Step-through pattern;Decreased stride length;Narrow base of support Modified Rankin (Stroke Patients Only) Pre-Morbid Rankin Score: No symptoms Modified Rankin: Moderately severe disability     PT Goals Acute Rehab PT Goals Time For Goal Achievement: 08/24/12 Potential to Achieve Goals: Fair Pt will go Supine/Side to Sit: with min assist PT Goal: Supine/Side to Sit -  Progress: Met Pt will go Sit to Supine/Side: with min assist Pt will go Sit to Stand: with modified independence PT Goal: Sit to Stand - Progress: Updated due to goal met Pt will go Stand to Sit: with modified independence PT Goal: Stand to Sit - Progress: Updated due to goals met Pt will Transfer Bed to Chair/Chair to Bed: with modified independence PT Transfer Goal: Bed to Chair/Chair to Bed - Progress: Updated due to goal met Pt will Ambulate: with modified independence;51 - 150 feet;with least restrictive assistive device PT Goal: Ambulate - Progress: Goal set today  Visit Information  Last PT Received On: 08/11/12 Assistance Needed: +1    Subjective Data      Cognition  Overall Cognitive Status: Impaired Arousal/Alertness: Awake/alert Orientation Level: Disoriented to;Place Behavior During Session: [facility] for tasks performed    Balance  Balance Balance Assessed: No  End of Session PT - End of Session Equipment Utilized During Treatment: Gait belt Activity Tolerance: Patient tolerated treatment well Patient left: in chair;with call bell/phone within reach Nurse Communication: Mobility status     Verdell Face, [place] [phone] 08/11/2012  Agree with above assessment  Lewis Shock, PT, DPT Pager #: [phone] Office #: [phone]

## 2012-08-20 NOTE — Progress Notes (Signed)
Pt and daughter given D/C instructions with verbal understanding. Pt and daughter also given stroke understanding with verbal understanding. Pt D/C'd home via wheelchair with daughter per MD order. Pt's Home Health set up prior to D/C. Rema Fendt, RN

## 2012-08-23 DIAGNOSIS — Z8701 Personal history of pneumonia (recurrent): Secondary | ICD-10-CM

## 2012-08-23 DIAGNOSIS — I10 Essential (primary) hypertension: Secondary | ICD-10-CM

## 2012-08-23 DIAGNOSIS — I69991 Dysphagia following unspecified cerebrovascular disease: Secondary | ICD-10-CM

## 2012-08-23 DIAGNOSIS — M6281 Muscle weakness (generalized): Secondary | ICD-10-CM

## 2012-08-23 DIAGNOSIS — R1312 Dysphagia, oropharyngeal phase: Secondary | ICD-10-CM

## 2012-08-25 ENCOUNTER — Telehealth: Payer: Self-pay | Admitting: Pulmonary Disease

## 2012-08-25 NOTE — Telephone Encounter (Signed)
I spoke with Rosanne Ashing. Gave VO. Nothing further was needed

## 2012-08-29 ENCOUNTER — Telehealth: Payer: Self-pay | Admitting: Adult Health

## 2012-08-29 ENCOUNTER — Telehealth: Payer: Self-pay | Admitting: Pulmonary Disease

## 2012-08-29 NOTE — Telephone Encounter (Signed)
Called and spoke with Margaret [name] and she stated that the pt has appt with SN on 1-22 at 57.  They are trying to get the pt qualified for more home health help.  Will need a form completed by SN for the veterans assistance to see if she can qualify for more help.  Pt is currently living in an independent living apartment and has [facility] coming in to do PT/OT and speech therapy.  Margaret [name] will fax this form over and would like to pick this up at her appt next week.   i will keep an eye out for this.  Nothing further is needed.

## 2012-08-29 NOTE — Telephone Encounter (Signed)
Called and spoke with ann from [facility] and VO given for extra visit---ann marie wanted to visit the pt again in 2-3 weeks to make sure the pt was doing ok and adjusting to the move.  Nothing further is needed.

## 2012-09-03 ENCOUNTER — Ambulatory Visit (INDEPENDENT_AMBULATORY_CARE_PROVIDER_SITE_OTHER): Payer: Medicare Other | Admitting: Pulmonary Disease

## 2012-09-03 ENCOUNTER — Encounter: Payer: Self-pay | Admitting: Pulmonary Disease

## 2012-09-03 ENCOUNTER — Encounter: Payer: Self-pay | Admitting: *Deleted

## 2012-09-03 VITALS — BP 180/84 | HR 60 | Temp 96.5°F | Ht 66.0 in | Wt 117.8 lb

## 2012-09-03 DIAGNOSIS — J4489 Other specified chronic obstructive pulmonary disease: Secondary | ICD-10-CM

## 2012-09-03 DIAGNOSIS — D649 Anemia, unspecified: Secondary | ICD-10-CM

## 2012-09-03 DIAGNOSIS — N390 Urinary tract infection, site not specified: Secondary | ICD-10-CM

## 2012-09-03 DIAGNOSIS — K5900 Constipation, unspecified: Secondary | ICD-10-CM

## 2012-09-03 DIAGNOSIS — M5030 Other cervical disc degeneration, unspecified cervical region: Secondary | ICD-10-CM

## 2012-09-03 DIAGNOSIS — K219 Gastro-esophageal reflux disease without esophagitis: Secondary | ICD-10-CM

## 2012-09-03 DIAGNOSIS — M1990 Unspecified osteoarthritis, unspecified site: Secondary | ICD-10-CM

## 2012-09-03 DIAGNOSIS — I679 Cerebrovascular disease, unspecified: Secondary | ICD-10-CM

## 2012-09-03 DIAGNOSIS — E7800 Pure hypercholesterolemia, unspecified: Secondary | ICD-10-CM

## 2012-09-03 DIAGNOSIS — F411 Generalized anxiety disorder: Secondary | ICD-10-CM

## 2012-09-03 DIAGNOSIS — J449 Chronic obstructive pulmonary disease, unspecified: Secondary | ICD-10-CM

## 2012-09-03 DIAGNOSIS — I5032 Chronic diastolic (congestive) heart failure: Secondary | ICD-10-CM

## 2012-09-03 DIAGNOSIS — E039 Hypothyroidism, unspecified: Secondary | ICD-10-CM

## 2012-09-03 DIAGNOSIS — J329 Chronic sinusitis, unspecified: Secondary | ICD-10-CM

## 2012-09-03 DIAGNOSIS — I872 Venous insufficiency (chronic) (peripheral): Secondary | ICD-10-CM

## 2012-09-03 DIAGNOSIS — I10 Essential (primary) hypertension: Secondary | ICD-10-CM

## 2012-09-03 DIAGNOSIS — R1310 Dysphagia, unspecified: Secondary | ICD-10-CM

## 2012-09-03 MED ORDER — LOSARTAN POTASSIUM 50 MG PO TABS: 50.0000 mg | ORAL_TABLET | Freq: Every day | ORAL | Status: DC

## 2012-09-03 NOTE — Progress Notes (Signed)
Subjective:    Patient ID: Margaret [name], female    DOB: Mar 26, 1924, 79 y.o.   MRN: [id number]  HPI 79 y/o WF here for a follow up visit... She has Chr Sinusitis;  HBP;  ASPVD;  Chol;  GERD/ Constip w/ hx impaction;  UTIs;  DJD/ DDD;  Hx anxiety;  Hx anemia...   ~  July 09, 2011:  485mo ROV & she brought all of her med bottles to the office today to review>>    Chr sinusitis>  Hx allergies & chr sinus problems w/ polyps & septal perf prev eval by DrShoemaker;  Currently only uses Mucinex & Saline prn...    HBP & pre-syncope>  She saw DrPRoss 10/12 after a spell at [facility] w/ near syncope; she adjusted her meds by stopping Cozaar50 & Amlodipine2.5, & starting PINDOLOL5Bid; Review of meds shows that she never stopped Losartan or Norvasc, she did fill Visken5 but only taking one/d; BP= 132/70 w/ transient postural drop ==> Rec to stop Amlodipine, continue Cozaar50, incr Pindolol5Bid...     Cerebrovasc dis>  On ASA 81mg /d;  sm vessel dis on prev MRI & faint bilat CBruits w/ hx neg CDopplers in the past...    Chol>  Prev on Simva40 but stopped by the pt 8/11 due to "weak"; we tried to get her on Crestor & so did DrRoss but pt stopped this too (?made her weak)...    GERD>  On Prevacid 30mg /d, known HH & followed by [facility] for GI w/ dilatation 11/11 & swallowing better...    Constip>  Hx severe constip w/ hosp for impaction 2003;  On Miralax daily & doing OK...    DJD>  Can't take NSAIDs well, uses Tylenol & prn Advil;  C/o some left ankle pain on & off...    Anxiety>  Not on meds...    Anemia & B12 defic>  Takes Fe daily & ?B12 shots monthly;  Labs today showed Hg=12.0, MCV=90...  ~  September 05, 2011:  83mo ROV & post hosp> Adm 08/29/11 w/ adb pain, fecal impaction, worsening dysphagia, UTI, HBP & chronic hyponatremia==> improved after fluids, laxatives, antibiotics, etc; MBS showed dysphagia & she is on a liq diet==> she has f/u w/ DrMagod soon for EGD & dilatation...  See Prob List below>>  ~   October 09, 2011:  85mo ROV> she reports doing fine, having good BMs, & no new complaints or concerns today...   BP controlled on Cozaar & Visken, 142/64 today, denies CP/ palpit/ ch in SOB/ edema...  She had EGD w/ Savory dilatation from her gastroenterologist [facility] 09/10/11> essentially neg EGD (sm HH, few tiny gastric polyps) & dilated w/o difficulty; states swallowing is improved, OK now...  Denies constip now on good regimen w/ Miralax daily & Dulcolax tabs/ suppos as needed...  Other problems as noted>>  ~  December 10, 2011:  83mo ROV & f/u from ER 2/13> seen in ER w/ abd pain, loose stool & N/V;  Exam was neg w/o abd tenderness or distention but rectal showed large hard stool burden in rectum;  Labs showed Hg=10.3, Na=125, UA was +UTI;  XRay showed mod stool burden in colon;  She was given dose of Rocephin, suppository, asked to incr Miralax to Bid...  I note that for all her trouble w/ chronic constipation she just doesn't get it & won't take regular dosing of Miralax, Senakot-S, dulcolax etc; she could not afford & would not fill the Amitiza Rx or Linzess...  See prob list  below>>    She reports "everything is OK now" & when pressed she admitted to not taking regular meds for her chr constipation> advised to restart MIRALAX Daily Qam & SENAKOT-S Qhs... She tells me she fell at church several weeks ago, no serious injury & didn't go to the ER but this launched [place] into a discussion about her status>> Lives alone in her own home, does some housework, takes her own meds ?OK, daughter visits 2-3x per week, son lives in [place], son in law does her yard work, sister drives her to appts, she says she is safe & does ok at home, she has no intention of looking into AL etc...  ~  March 10, 2012:  62mo ROV & Margaret [name] has a number of chronic complaints> feels weak, not able to walk far, food doesn't taste good, some minor bruising noted, etc; states "I'm doing pretty good on my own at home" & she refuses to make other  plans for the future...     As noted she has had a signif prob w/ constipation & XRays have consistently shown large stool burden; she seems incapable of self monitoring BMs/ constip/ etc; she has only been taking 1 tablesp of Miralax daily & once again asked to take 1 capful of Miralax daily along w/ 1-2 Senakot-S at bedtime!!!    We reviewed prob list, meds, xrays and labs> see below for updates >>  ~  July 21, 2012:  68mo ROV & post hosp check> presents from [facility] w/o med list today for post [facility] check, states she is trying to get into AL or independ living facility...    Hosp 9/4 - 04/28/12 by [facility] w/ "I was sick" per pt- chr constip, chr hyponatremia, HBP- she was c/o n/v & abd pain- assoc w/ recurrent constip, her gastroenterologist is DrMagod & he has her on Miralax alone for her slow transit (although we have had her on more meds & she still gets constipated); also had evid proctitis on CT Abd & treated w/ Cipro/ Flagyl in hosp; also had patulous esoph w/ fluid & debris present- DrMagod did EGD/ dilatation 10/13 showing smHH w/ short segm Barrett's, poor esoph motility, mult sm gastric polyps, s/p savory dilatation;  She also had prob asp pneumonia & saw DrHatcher for ID- they considered MAI but signs more c/w chr asp due to her esoph prob;  They also had the palliative care team involved- she is a DNR, declines feeding tubes, they were to f/u as out pt...     As noted she remains in [facility] at present, attended by DrSouth> we have called for her current med list >> We reviewed the following medical problems during today's office visit >>     Chronic sinus problems> followed by DrShoemaker- hx nasal septal perf, on Saline, Mucinex, Fluids...    Chronic Lung Dis> Bronchiectasis, recurrent asp pneumonia, ?MAI?> prev Mucinex was stopped in the NH, no pos resp cultures in EPIC...    HBP> on Cozaar50, Pindolol5Bid; BP= 150/80 & she denies CP, palpit, ch in SOB, edema, etc...    Cerebrovasc  Dis> prev on ASA81; she denies cerebral ischemic symptoms...    Ven Insuffic> she knows to avoid sodium, elev legs, wear support hose when needed...    CHOL> she declines any chol meds; last FLP 7/12 was ?on Cres20- TChol 163, TG 91, HDL 72, LDL 73    Hypothy> on Synth75, last TSH 9/13 = 0.51    GI- GERD, esoph dysmotility  w/ chr asp, ?Barretts, severe chr constipation> Prevacid30/d, pills crushed in applesauce, Miralax 17gm/d, Senakot-S Bid; her GI issues are managed by DrMagod et al...    Hx recurrent UTIs> Urine cult at [facility] grew Citrobacter- sens to Cipro, treated...    DJD, DDD in Cspine> on VitD 50K weekly, Tylenol prn...    Anxiety> not currently on anxiolytic Rx...    Hx Urticaria> the etiology was never delineated, but hasn't been recurrent either...    Anemia, B12 Defic> on VitB121000 mcg/d; last B12 level was 1/13 = 635... We reviewed prob list, meds, xrays and labs> see below for updates >>   ~  September 03, 2012:  6wk ROV & post hospital check > Margaret [name] was again admitted 12/28 - 08/19/12 by [facility] w/ episode confusion, ER eval revealed hyponatremia (121), UTI (pan-sens Klebs), & stroke w/ an acute right occipital infarct on MRI (likely due to her small vessel dis); her sodium improved w/ IVF, her aspirin was increased to 325mg /d, & they convinced her to go back on Statin rx w/ Simva20; she had in-patient therapies & was disch to skilled nursing but family declined & took her home w/ home health=> now in new apt at the [facility]...     Chronic sinus problems> followed by DrShoemaker- hx nasal septal perf, on Saline, Mucinex, Fluids...    Chronic Lung Dis> Bronchiectasis, recurrent asp pneumonia, ?MAI?> prev Mucinex was stopped in the NH, no pos resp cultures in EPIC...    HBP> on Lisinopril2.5, Pindolol5Bid; BP= 180/84 & she denies CP, palpit, ch in SOB, edema; we decided to switch the Lisin to LOSARTAN 50mg /d...    Cerebrovasc Dis> on ASA325; she denies recurrent cerebral ischemic  symptoms...    Ven Insuffic> she knows to avoid sodium, elev legs, wear support hose when needed...    CHOL> now on Simva20 since 1/14 hosp; last FLP12/13 in hosp showed TChol 200, TG 76, HDL 78, LDL 107=> Neuro started the Simva20.    Hypothy> on Synth75, last TSH 12/13 = 1.54    GI- GERD, esoph dysmotility w/ chr asp, ?Barretts, severe chr constipation> Prevacid30/d, pills crushed in applesauce, Miralax 17gm/d, Senakot-S Bid & Mineral Oil prn; her GI issues are managed by DrMagod et al...    Hx recurrent UTIs> Urine cult at [facility] grew Citrobacter- sens to Cipro; UTI on adm 12/13=> pan-sens Klebs...    DJD, DDD in Cspine> on VitD 50K weekly, Tylenol prn...    Anxiety> on Xanax 0.25mg  prn since 1/14 hosp...    Hx Urticaria> the etiology was never delineated, but hasn't been recurrent either...    Anemia, B12 Defic> on VitB121000 mcg/d; last B12 level was 1/13 = 635... We reviewed prob list, meds, xrays and labs> see below for updates >>      Problem List:      SINUSITIS, CHRONIC (ICD-473.9) - Long Hx chronic sinus problems - Eval & Rx by DrShoemaker w/ hx epistaxis, nasal septal erosion w/ prev "button";  and nasal polyps... she uses Saline, Mucinex, etc... she notes mild chr cough from the drainage- we reviewed chronic nasal regimen & rec MUCINEX DM 2Bid...  HYPERTENSION (ICD-401.9) >> currently controlled on LOSARTAN 50mg /d & PINDOLOL 5mg Bid... ~  Baseline EKG w/ NSR, WNL... ~  2D ECHO 2001 was normal... ~  CXR 11/07 w/ Biapical pl thickening, NAD... CXR 9/09 showed COPD w/ incr markings, NAD... f/u CXR 5/11 showed COPD, NAD...  ~  11/10: c/o weak in AM- decision to decr Amlodipine to 5mg /d & BP has been  stable on this. ~  8/11: Hosp by [facility] w/ "presyncope" & meds were adjusted but she restarted prev Rx on discharge> we decided to stop Diovan/Hct in favor of LOSARTAN 50mg /d (?if she ever even filled it- reminded to bring all med bottles to every visit). ~  7/12: BP today= 120/70, no  postural changes, ?on Amlodip5 & Losartan50... she denies HA, visual symptoms, CP, palpit, change in SOB, edema, etc... ~  10/12: Near syncope at [facility]> saw DrRoss w/ attempted meds changes (asked to stop Amlodip/Losartan & start Pindolol5Bid) but she decided to take all 3... ~  11/12: BP today= 132/70 w/ transient postural drop; asked to stop Amlodip, continue LOSARTAN50, take VISKEN5Bid... ~  1/13: BP= 118/72 on same meds & feeling better since disch... ~  2/13: BP= 142/64 on Losar50 & Pindolol5Bid, continue same; 1 view CXR 2/13 showed COPD, biapical scarring, NAD... ~  4/13: BP= 128/72 w/o postural changes; she denies CP, palpit, ch in DOE, edema... ~  7/13:  BP= 118/72 & she remains asymptomatic... ~  12/13:  CXR showed norm heart size, clear lungs, NAD;  EKG 12/13 showed NSR, rate87, 1st degree AVB, NAD... ~  12/13:  2DEcho showed norm LVF w/ EF=65-70%, Gr1DD, mildly calcif MV leaflets w/o stenosis or regurg... ~  1/14: on Lisinopril2.5, Pindolol5Bid; BP= 180/84 & she denies CP, palpit, ch in SOB, edema; we decided to switch the Lisin to LOSARTAN 50mg /d.Marland Kitchen  CEREBROVASCULAR DISEASE (ICD-437.9) - on ASA 325mg /d... Prev eval for syncope (related to postural BP changes) w/ MRI showing small vessel dz;  she has bilat CBruits w/ Carotid Dopplers in 2001 showing mild plaque, no signif ICA stenoses... ~  she denies cerebral ischemic symptoms on the ASA 81mg /d...  ~  Adm 12/13-1/14 & MRI brain showed sm acute infarct in right occipital lobe, advanced microvasc ischemia; seen by Neuro & they incr her ASA to 325mg /d. ~  12/13: CDopplers showed mild to mod heterogeneous plaque on right w/ 0-39% ICA stenosis; only mild plaque on left & no stenosis, vertebrals were antegrade... ~  1/14: on ASA325; she denies recurrent cerebral ischemic symptoms...  VENOUS INSUFFICIENCY (ICD-459.81) - on low sodium diet + support hose as needed.  HYPERCHOLESTEROLEMIA (ICD-272.0) - prev on Simva40 but she thinks this made her  weak & she stopped it;  Tried Crestor 20mg /d but ?if filling this med regularly or not, asked to bring all bottles to each visit... ~  FLP 12/08 showed TChol 204, TG 93, HDL 67, LDL 106... On Simva40/d. ~  FLP 9/09 showed TChol 183, TG 53, HDL 68, LDL 105... Contin same + diet. ~  FLP 8/10 showed TChol 224, TG 78, HDL 67, LDL 137... rec> take med regularly & work on diet. ~  FLP 5/11 showed TChol 187, TG 80, HDL 67, LDL 104 ~  8/11: she thinks the Simva40 makes her weak & she wants to put it on HOLD. ~  FLP off meds 3/12 showed TChol 293, TG 90, HDL 81, LDL 191... rec to restart Cres20. ~  FLP 7/12 ?on Cres20 showed TChol 163, TG 91, HDL 72, LDL 73 ~  She has since stopped the Crestor on her own (?making her weak). ~  1/13 & 4/13: Pt on diet alone & doesn't want Chol meds; not fasting today for FLP... ~  FLP 12/13 in hosp showed TChol 200, TG 76, HDL 78, LDL 107=> Neuro started the Simva20.  UNSPECIFIED DISORDER OF THYROID/ HYPOTHYROIDISM - she has a sl elevated TSH which we  were following & finally started SYNTHROID 17mcg/d 3/12... ~  labs 9/09 showed TSH= 7.31 ~  labs 1/10 showed TSH= 5.40 ~  labs 8/10 showed TSH= 5.08... she does c/o being "cold" ~  labs 5/11 showed TSH= 6.11... she notes that her energy is "pretty fair" ~  labs 8/11 showed TSH= 0.73... wt= 116# & stable over the last 51mo. ~  Labs 3/12 showed TSH= 9.85... Rec> start SYNTHROID 57mcg/d. ~  Labs 7/12 on Levo75 showed TSH= 0.27... Keep same for now. ~  Labs 1/13 on Levo75 showed TSH= 4.68 ~  Labs 12/13 on Levo75 showed TSH= 1.54  GERD (ICD-530.81) - PPI changed to generic PREVACID 30mg /d... Known HH & GERD w/ EGD from [facility] 8/01 & 2/06 showing HH, GERD, erosions, & gastric polyp...  ~  EGD 12/08 w/ savory dilatation... she is encouraged to f/u w/ DrMagod if symptoms occur... ~  f/u EGD w/ dilatation 11/11 by [facility] for dysphagia symptoms showed tort esoph, sm HH, tiny gastric polyps, Savory dil done. ~  Yet another EGD &  dilatation 1/13 by DrMagod; smHH, few tiny gastric polyps, Savory dilatation done...  CONSTIPATION (ICD-564.00) - Hx severe constipation w/ fecal impaction req hosp 2/03 & intermittently over the yrs... ~  colonoscopy 2/06 DrMagod w/ hemorrhoids & sm polyps... Encouraged to use MIRALAX daily... ~  [facility] 10/11 w/ constip & fecal impaction> disimpacted & started back on MIRALAX & SENAKOT-S... ~  CT Abd 10/11 showed norm GB, sm bilat renal cysts, prev hyst, rectal wall thickening c/w proctitis. ~  [facility] 1/13 w/ similar problem & we reviewed the importance of taking the Miralax & Senakot every day & monitoring her output w/ contingency meds (Mag citrate & Dulcolax) if needed... ~  2/13 OV: she reports improved w/ regular Miralax & prn Dulcolax; asked to restart the SENAKOT-S Qhs as well...  UTI (ICD-599.0) - Hx chronic UTI's Rx by Urology Joneen Boers w/ Macrodantin in the past... ~  8/11: Hosp by [facility] w/ UTI- sens EColi treated w/ Rocephin/ then Septra. ~  2/13: UTI w/ final C&S growing Staph & Enterococcus both sens to Levaquin & Tcn> Rx Levaquin 500mg /d + Align. ~  She has had numerous UTIs in the interval- Hosp 1/14 w/ pan-sens Klebs...  DEGENERATIVE JOINT DISEASE (ICD-715.90) - she uses Tylenol/ Advil as needed... ~  5/09:  she fell and broke her right wrist- s/p ORIF 6/09 by DrWeingold  DEGENERATIVE DISC DISEASE, CERVICAL SPINE (ICD-722.4) - CSpine surg by DrJenkins 1998... ~  12/13:  She is back on Vit D 50,000 u weekly...  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 ~  1/14: she was disch by [facility] on Lisinopril 2.5mg  & we will watch her closely for allergic reaction...  ANEMIA (ICD-285.9) >> she takes one IRON tab daily & B12 1056mcg/d... VITAMIN B12 DEFICIENCY (ICD-266.2) ~  labs 6/08 showed Hg= 10.5 ~  labs 12/08 showed Hg= 12.3, Fe= 92 ~  labs 9/09 showed Hg= 11.8, MCV=89 ~  labs 1/10 showed Hg=  12.1, MCV= 89, Fe= 83, B12= 186... started on B12 shots monthly. ~  labs 8/10 showed Hg= 12.2, MCV= 92, Fe= 87, B12= 470... continue Rx. ~  labs 5/11 showed Hg= 11.2, MCV= 88 ~  labs 8/11 in hosp showed Hg= 10-11 range; ?switched to Oral B12- 1063mcg/d... ~  labs 10/11 in hosp showed Hg= 10-11 range... asked to start Femiron one tab daily. ~  Labs 3/12 showed Hg= 12.0, MCV= 90 ~  Labs 7/12 showed Hg= 11.1, MCV= 89, Fe= 87 (22%sat) ~  Labs 1/13 showed Hg=10.3, MCV=90, Fe=46 (13%sat), B12=635, SPE=neg (no monoclonal spike). ~  Labs 1/14 showed Hg= 10-11 range  Hx SHINGLES - right T9-10 distrib 6/10 & treated w/ Famvir, Medrol, DCN100...   Past Surgical History  Procedure Date  . Tonsillectomy   . Total abdominal hysterectomy w/ bilateral salpingoophorectomy   . Cervical fusion   . Cataract extraction   . Nasal sinus surgery   . Esophagogastroduodenoscopy 09/10/2011    Procedure: ESOPHAGOGASTRODUODENOSCOPY (EGD);  Surgeon: Petra Kuba, MD;  Location: [facility] ENDOSCOPY;  Service: Endoscopy;  Laterality: N/A;  c-arm needed  . Savory dilation 09/10/2011    Procedure: SAVORY DILATION;  Surgeon: Petra Kuba, MD;  Location: [facility] ENDOSCOPY;  Service: Endoscopy;  Laterality: N/A;  . Esophagogastroduodenoscopy 05/13/2012    Procedure: ESOPHAGOGASTRODUODENOSCOPY (EGD);  Surgeon: Petra Kuba, MD;  Location: [facility] ENDOSCOPY;  Service: Endoscopy;  Laterality: N/A;  with carm  . Savory dilation 05/13/2012    Procedure: SAVORY DILATION;  Surgeon: Petra Kuba, MD;  Location: [facility] ENDOSCOPY;  Service: Endoscopy;  Laterality: N/A;    Outpatient Encounter Prescriptions as of 09/03/2012  Medication Sig Dispense Refill  . ALPRAZolam (XANAX) 0.25 MG tablet Take 0.125 mg by mouth every 6 (six) hours as needed. For anxiety      . aspirin EC 325 MG tablet Take 1 tablet (325 mg total) by mouth daily.  30 tablet  0  . azithromycin (AZASITE) 1 % ophthalmic solution Place 1 drop into both eyes every evening.       .  cyanocobalamin 1000 MCG tablet Take 1,000 mcg by mouth every morning.       . feeding supplement (ENSURE COMPLETE) LIQD Take 237 mLs by mouth daily.      . lansoprazole (PREVACID) 30 MG capsule Take 30 mg by mouth every morning.       Marland Kitchen levothyroxine (SYNTHROID, LEVOTHROID) 75 MCG tablet Take 75 mcg by mouth every morning.       Marland Kitchen lisinopril (ZESTRIL) 2.5 MG tablet Take 1 tablet (2.5 mg total) by mouth daily.  30 tablet  0  . methylcellulose (ARTIFICIAL TEARS) 1 % ophthalmic solution Place 1 drop into both eyes as needed.      . mineral oil liquid Take 30 mLs by mouth daily as needed. For constipation      . pindolol (VISKEN) 5 MG tablet Take 5 mg by mouth 2 (two) times daily.      . polyethylene glycol powder (GLYCOLAX/MIRALAX) powder Take 17 g by mouth daily.      Marland Kitchen senna-docusate (SENOKOT-S) 8.6-50 MG per tablet Take 1 tablet by mouth 2 (two) times daily.  30 tablet  1  . simvastatin (ZOCOR) 20 MG tablet Take 1 tablet (20 mg total) by mouth daily at 6 PM.  30 tablet  0  . vitamin D, CHOLECALCIFEROL, 400 UNITS tablet Take 400 Units by mouth every 7 (seven) days. On Thursdays        Allergies  Allergen Reactions  . Aspirin Hives    High Doses     Current Medications, Allergies, Past Medical History, Past Surgical History, Family History, and Social History were reviewed in [employer] record.    Review of Systems        See HPI - all other systems neg except as noted...  The patient complains of weight loss, decreased hearing,  dyspnea on exertion, headaches, severe indigestion/heartburn, muscle weakness, and difficulty walking.  The patient denies anorexia, fever, weight gain, vision loss, hoarseness, chest pain, syncope, peripheral edema, prolonged cough, hemoptysis, abdominal pain, melena, hematochezia, hematuria, incontinence, suspicious skin lesions, transient blindness, depression, unusual weight change, abnormal bleeding, enlarged lymph nodes, and angioedema.      Objective:   Physical Exam     WD, WN, 79 y/o WF in NAD... she is chr ill appearing... wt 112# GENERAL:  Alert & oriented; pleasant & cooperative... HEENT:  Banks/AT, EOM-wnl, PERRLA, EACs- sl wax, TMs-wnl, NOSE- nasal septal perf, THROAT- sl red +drainage... NECK:  Supple w/ decrROM; no JVD; normal carotid impulses w/ 1+bruits; no thyromegaly or nodules palpated; no lymphadenopathy. CHEST:  Clear to P & A; without wheezes or rales, but scat rhonchi heard... HEART:  Regular Rhythm;  gr1/6 SEM without rubs or gallops detected... ABDOMEN:  Soft & nontender; normal bowel sounds; no organomegaly or masses palpated... EXT: without deformities, mild arthritic changes; no varicose veins/ +venous insuffic/ no edema. NEURO:  CN's intact;  no focal neuro deficits, she is weak diffusely... DERM: scarring in the right T9-10 distrib of her prev shingles eruption...  RADIOLOGY DATA:  Reviewed in the EPIC EMR & discussed w/ the patient...  LABORATORY DATA:  Reviewed in the EPIC EMR & discussed w/ the patient...   Assessment & Plan:    Hx Chronic Sinusitis>  Long hx chr sinus problems and perforated nasal septum; rec to use Mucinex 1-2 Bid + Fluids and nasal saline mist...  Chr Lung dis- Bronchiectasis, Asp pneumonias, ?MAI?- not proven> most findings the result of her severe esoph problems...  HBP>  BP meds adjusted freq but she has been non-compliant; pt asked to bring all med bottles to every visit; we decided upon LOSARTAN 50mg /d & PINDOLOL 5mg Bid==> BP is stable on this Rx.  Cerebrovasc Dis/ right occip stroke>  She denies any recurrent cerebral ischemic symptoms since her [facility] 1/14 w/ the stroke; no postural BP changes; continue ASA 325mg  daily...  Venous Insuffic>  Reminded to elim sodium, elevate legs, wear support hose...  CHOL>  She prev declined all Statin meds, but agreed to Simva20 after her 1/14 hosp w/ right occipital stroke...  HYPOTHYROID>  On Synthroid 46mcg/d & labs are  better- keep same for now...  GI> GERD, Dysphagia, Constip, recurrent impactions, etc>  rec to take PPI/ Prevacid regularly, and continue Miralax/ Senakot-S etc; reminded to take laxative regimen every day!!!  She had repeat EGD by [facility] 1/13 & 10/13 w/ dilatations...  DJD, CSpine Disc dis, etc>  Stable on OTC Tylenol & occas Advil...  ANEMIA & B12 Defic>  She remains on Fe one tab daily & B12 orally as well...   Patient's Medications  New Prescriptions   LOSARTAN (COZAAR) 50 MG TABLET    Take 1 tablet (50 mg total) by mouth daily.  Previous Medications   ALPRAZOLAM (XANAX) 0.25 MG TABLET    Take 0.125 mg by mouth every 6 (six) hours as needed. For anxiety   ASPIRIN EC 325 MG TABLET    Take 1 tablet (325 mg total) by mouth daily.   AZITHROMYCIN (AZASITE) 1 % OPHTHALMIC SOLUTION    Place 1 drop into both eyes every evening.    CYANOCOBALAMIN 1000 MCG TABLET    Take 1,000 mcg by mouth every morning.    FEEDING SUPPLEMENT (ENSURE COMPLETE) LIQD    Take 237 mLs by mouth daily.   LANSOPRAZOLE (PREVACID) 30 MG CAPSULE  Take 30 mg by mouth every morning.    LEVOTHYROXINE (SYNTHROID, LEVOTHROID) 75 MCG TABLET    Take 75 mcg by mouth every morning.    METHYLCELLULOSE (ARTIFICIAL TEARS) 1 % OPHTHALMIC SOLUTION    Place 1 drop into both eyes as needed.   MINERAL OIL LIQUID    Take 30 mLs by mouth daily as needed. For constipation   PINDOLOL (VISKEN) 5 MG TABLET    Take 5 mg by mouth 2 (two) times daily.   POLYETHYLENE GLYCOL POWDER (GLYCOLAX/MIRALAX) POWDER    Take 17 g by mouth daily.   SENNA-DOCUSATE (SENOKOT-S) 8.6-50 MG PER TABLET    Take 1 tablet by mouth 2 (two) times daily.   SIMVASTATIN (ZOCOR) 20 MG TABLET    Take 1 tablet (20 mg total) by mouth daily at 6 PM.   VITAMIN D, CHOLECALCIFEROL, 400 UNITS TABLET    Take 400 Units by mouth every 7 (seven) days. On Thursdays  Modified Medications   No medications on file  Discontinued Medications   LISINOPRIL (ZESTRIL) 2.5 MG TABLET    Take 1  tablet (2.5 mg total) by mouth daily.

## 2012-09-03 NOTE — Patient Instructions (Addendum)
Today we updated your med list in our EPIC system...    We decided to STOP the current Lisinopril Rx & switch back to the LOSARTAN 50mg - one tab daily...    Continue your other meds the same...  Call for any questions...  Let's plan a follow up visit in 6-8 weeks w/ blood work at that time.Marland KitchenMarland Kitchen

## 2012-09-09 ENCOUNTER — Telehealth: Payer: Self-pay | Admitting: Pulmonary Disease

## 2012-09-09 NOTE — Telephone Encounter (Signed)
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  Gloria--patients daughter, will fax over form to front fax.  Will forward to [facility] as FYI

## 2012-09-11 NOTE — Telephone Encounter (Signed)
Margaret [name], was this fax ever received?

## 2012-09-12 NOTE — Telephone Encounter (Signed)
Received this form and waiting for SN to review so we can do the letter.

## 2012-09-18 NOTE — Telephone Encounter (Signed)
Leigh, are you still working on this for the pt?

## 2012-09-22 DIAGNOSIS — R1314 Dysphagia, pharyngoesophageal phase: Secondary | ICD-10-CM

## 2012-09-22 DIAGNOSIS — M5030 Other cervical disc degeneration, unspecified cervical region: Secondary | ICD-10-CM

## 2012-09-22 DIAGNOSIS — D649 Anemia, unspecified: Secondary | ICD-10-CM

## 2012-09-22 DIAGNOSIS — R269 Unspecified abnormalities of gait and mobility: Secondary | ICD-10-CM

## 2012-09-22 DIAGNOSIS — I69991 Dysphagia following unspecified cerebrovascular disease: Secondary | ICD-10-CM

## 2012-09-22 DIAGNOSIS — I69998 Other sequelae following unspecified cerebrovascular disease: Secondary | ICD-10-CM

## 2012-09-22 NOTE — Telephone Encounter (Signed)
Please advise status of the form, thanks

## 2012-09-22 NOTE — Telephone Encounter (Signed)
Pt's daughter Malachi Bonds wants to know status of this- she needs to have this completed asap. Call daughter at work today @ [phone] (let whoever answers the phone know that it is her "mom's dr's office calling" and they will get her to the phone. Hazel Sams

## 2012-09-29 ENCOUNTER — Telehealth: Payer: Self-pay | Admitting: Pulmonary Disease

## 2012-09-30 NOTE — Telephone Encounter (Signed)
Leigh, pls advise on the status of form.  Thank you.

## 2012-10-01 NOTE — Telephone Encounter (Signed)
Verlon Au called and lmom to let the daughter know that SN is working on this and we will call her once this is completed.  There was a second message on this that has been closed.  thanks

## 2012-10-01 NOTE — Telephone Encounter (Signed)
SN has this form and he will complete the letter that is needed.  thanks

## 2012-10-01 NOTE — Telephone Encounter (Signed)
LMOVM for Margaret [name] letting her know that SN has the forms to be completed

## 2012-10-02 ENCOUNTER — Other Ambulatory Visit: Payer: Self-pay | Admitting: Pulmonary Disease

## 2012-10-02 NOTE — Telephone Encounter (Signed)
Received faxed refill request from [facility] Lisinopril 2.5mg  QD Per 1.22.14 ov w/ SN:  Patient Instructions    Today we updated your med list in our EPIC system...  We decided to STOP the current Lisinopril Rx & switch back to the LOSARTAN 50mg - one tab daily...  Continue your other meds the same...  Call for any questions...  Let's plan a follow up visit in 6-8 weeks w/ blood work at that time...    Denial written on request and faxed back to pharmacy.

## 2012-10-03 ENCOUNTER — Telehealth: Payer: Self-pay | Admitting: Pulmonary Disease

## 2012-10-03 ENCOUNTER — Encounter: Payer: Self-pay | Admitting: Pulmonary Disease

## 2012-10-03 NOTE — Telephone Encounter (Signed)
Please advise Leigh thanks 

## 2012-10-03 NOTE — Telephone Encounter (Signed)
Letter has been completed by SN and i have left this up front for the daughter to pick this up.

## 2012-10-15 ENCOUNTER — Ambulatory Visit: Payer: Medicare Other | Admitting: Pulmonary Disease

## 2012-10-28 ENCOUNTER — Emergency Department (HOSPITAL_COMMUNITY): Payer: Medicare Other

## 2012-10-28 ENCOUNTER — Encounter (HOSPITAL_COMMUNITY): Payer: Self-pay

## 2012-10-28 ENCOUNTER — Inpatient Hospital Stay (HOSPITAL_COMMUNITY)
Admission: EM | Admit: 2012-10-28 | Discharge: 2012-11-03 | DRG: 177 | Disposition: A | Discharge status: Hospice | Payer: Medicare Other | Attending: Internal Medicine | Admitting: Internal Medicine

## 2012-10-28 DIAGNOSIS — E079 Disorder of thyroid, unspecified: Secondary | ICD-10-CM

## 2012-10-28 DIAGNOSIS — D72829 Elevated white blood cell count, unspecified: Secondary | ICD-10-CM | POA: Diagnosis present

## 2012-10-28 DIAGNOSIS — R55 Syncope and collapse: Secondary | ICD-10-CM

## 2012-10-28 DIAGNOSIS — R1310 Dysphagia, unspecified: Secondary | ICD-10-CM

## 2012-10-28 DIAGNOSIS — F411 Generalized anxiety disorder: Secondary | ICD-10-CM

## 2012-10-28 DIAGNOSIS — J329 Chronic sinusitis, unspecified: Secondary | ICD-10-CM

## 2012-10-28 DIAGNOSIS — R410 Disorientation, unspecified: Secondary | ICD-10-CM

## 2012-10-28 DIAGNOSIS — E7800 Pure hypercholesterolemia, unspecified: Secondary | ICD-10-CM | POA: Diagnosis present

## 2012-10-28 DIAGNOSIS — I639 Cerebral infarction, unspecified: Secondary | ICD-10-CM

## 2012-10-28 DIAGNOSIS — I10 Essential (primary) hypertension: Secondary | ICD-10-CM | POA: Diagnosis present

## 2012-10-28 DIAGNOSIS — E236 Other disorders of pituitary gland: Secondary | ICD-10-CM | POA: Diagnosis present

## 2012-10-28 DIAGNOSIS — J449 Chronic obstructive pulmonary disease, unspecified: Secondary | ICD-10-CM | POA: Diagnosis present

## 2012-10-28 DIAGNOSIS — I679 Cerebrovascular disease, unspecified: Secondary | ICD-10-CM | POA: Diagnosis present

## 2012-10-28 DIAGNOSIS — E876 Hypokalemia: Secondary | ICD-10-CM | POA: Diagnosis present

## 2012-10-28 DIAGNOSIS — N179 Acute kidney failure, unspecified: Secondary | ICD-10-CM

## 2012-10-28 DIAGNOSIS — M1990 Unspecified osteoarthritis, unspecified site: Secondary | ICD-10-CM

## 2012-10-28 DIAGNOSIS — E039 Hypothyroidism, unspecified: Secondary | ICD-10-CM

## 2012-10-28 DIAGNOSIS — S62109A Fracture of unspecified carpal bone, unspecified wrist, initial encounter for closed fracture: Secondary | ICD-10-CM

## 2012-10-28 DIAGNOSIS — N390 Urinary tract infection, site not specified: Secondary | ICD-10-CM

## 2012-10-28 DIAGNOSIS — M5030 Other cervical disc degeneration, unspecified cervical region: Secondary | ICD-10-CM | POA: Diagnosis present

## 2012-10-28 DIAGNOSIS — E871 Hypo-osmolality and hyponatremia: Secondary | ICD-10-CM | POA: Diagnosis present

## 2012-10-28 DIAGNOSIS — J4489 Other specified chronic obstructive pulmonary disease: Secondary | ICD-10-CM | POA: Diagnosis present

## 2012-10-28 DIAGNOSIS — B029 Zoster without complications: Secondary | ICD-10-CM

## 2012-10-28 DIAGNOSIS — K6289 Other specified diseases of anus and rectum: Secondary | ICD-10-CM

## 2012-10-28 DIAGNOSIS — I509 Heart failure, unspecified: Secondary | ICD-10-CM | POA: Diagnosis present

## 2012-10-28 DIAGNOSIS — I872 Venous insufficiency (chronic) (peripheral): Secondary | ICD-10-CM | POA: Diagnosis present

## 2012-10-28 DIAGNOSIS — L501 Idiopathic urticaria: Secondary | ICD-10-CM

## 2012-10-28 DIAGNOSIS — J690 Pneumonitis due to inhalation of food and vomit: Principal | ICD-10-CM | POA: Diagnosis present

## 2012-10-28 DIAGNOSIS — R1312 Dysphagia, oropharyngeal phase: Secondary | ICD-10-CM | POA: Diagnosis present

## 2012-10-28 DIAGNOSIS — K5900 Constipation, unspecified: Secondary | ICD-10-CM

## 2012-10-28 DIAGNOSIS — E538 Deficiency of other specified B group vitamins: Secondary | ICD-10-CM

## 2012-10-28 DIAGNOSIS — Z66 Do not resuscitate: Secondary | ICD-10-CM | POA: Diagnosis present

## 2012-10-28 DIAGNOSIS — I5032 Chronic diastolic (congestive) heart failure: Secondary | ICD-10-CM | POA: Diagnosis present

## 2012-10-28 DIAGNOSIS — R636 Underweight: Secondary | ICD-10-CM

## 2012-10-28 DIAGNOSIS — E41 Nutritional marasmus: Secondary | ICD-10-CM | POA: Diagnosis present

## 2012-10-28 DIAGNOSIS — Z515 Encounter for palliative care: Secondary | ICD-10-CM

## 2012-10-28 DIAGNOSIS — K219 Gastro-esophageal reflux disease without esophagitis: Secondary | ICD-10-CM

## 2012-10-28 DIAGNOSIS — D649 Anemia, unspecified: Secondary | ICD-10-CM | POA: Diagnosis present

## 2012-10-28 LAB — URINALYSIS, MICROSCOPIC ONLY
Nitrite: NEGATIVE
Specific Gravity, Urine: 1.012 (ref 1.005–1.030)
Urobilinogen, UA: 0.2 mg/dL (ref 0.0–1.0)

## 2012-10-28 LAB — CBC WITH DIFFERENTIAL/PLATELET
Basophils Absolute: 0 10*3/uL (ref 0.0–0.1)
Eosinophils Absolute: 0 10*3/uL (ref 0.0–0.7)
Eosinophils Relative: 0 % (ref 0–5)
Lymphocytes Relative: 16 % (ref 12–46)
MCV: 86.5 fL (ref 78.0–100.0)
Neutrophils Relative %: 79 % — ABNORMAL HIGH (ref 43–77)
Platelets: 201 10*3/uL (ref 150–400)
RDW: 15 % (ref 11.5–15.5)
WBC: 14.4 10*3/uL — ABNORMAL HIGH (ref 4.0–10.5)

## 2012-10-28 LAB — BLOOD GAS, ARTERIAL
Drawn by: 11249
FIO2: 0.21 %
O2 Saturation: 98.5 %
Patient temperature: 98.6
pO2, Arterial: 121 mmHg — ABNORMAL HIGH (ref 80.0–100.0)

## 2012-10-28 LAB — POCT I-STAT, CHEM 8
Glucose, Bld: 111 mg/dL — ABNORMAL HIGH (ref 70–99)
HCT: 26 % — ABNORMAL LOW (ref 36.0–46.0)
Hemoglobin: 8.8 g/dL — ABNORMAL LOW (ref 12.0–15.0)
Potassium: 3.8 mEq/L (ref 3.5–5.1)
Sodium: 122 mEq/L — ABNORMAL LOW (ref 135–145)

## 2012-10-28 LAB — RAPID URINE DRUG SCREEN, HOSP PERFORMED
Amphetamines: NOT DETECTED
Tetrahydrocannabinol: NOT DETECTED

## 2012-10-28 MED ORDER — AZITHROMYCIN 200 MG/5ML PO SUSR
500.0000 mg | Freq: Once | ORAL | Status: AC
  Administered 2012-10-28: 500 mg via ORAL
  Filled 2012-10-28: qty 12.5

## 2012-10-28 MED ORDER — AZITHROMYCIN 250 MG PO TABS
500.0000 mg | ORAL_TABLET | Freq: Once | ORAL | Status: DC
  Filled 2012-10-28: qty 2

## 2012-10-28 MED ORDER — PIPERACILLIN-TAZOBACTAM 3.375 G IVPB: 3.3750 g | Freq: Once | INTRAVENOUS | Status: DC

## 2012-10-28 MED ORDER — PIPERACILLIN-TAZOBACTAM 3.375 G IVPB 30 MIN
3.3750 g | Freq: Three times a day (TID) | INTRAVENOUS | Status: DC
  Administered 2012-10-28: 3.375 g via INTRAVENOUS
  Filled 2012-10-28 (×2): qty 50

## 2012-10-28 MED ORDER — LIDOCAINE VISCOUS 2 % MT SOLN
20.0000 mL | Freq: Once | OROMUCOSAL | Status: DC
  Filled 2012-10-28: qty 20

## 2012-10-28 MED ORDER — LIDOCAINE HCL 2 % EX GEL
CUTANEOUS | Status: AC
  Administered 2012-10-28: 23:00:00
  Filled 2012-10-28: qty 10

## 2012-10-28 MED ORDER — DEXTROSE 5 % IV SOLN
1.0000 g | Freq: Once | INTRAVENOUS | Status: AC
  Administered 2012-10-28: 1 g via INTRAVENOUS
  Filled 2012-10-28: qty 10

## 2012-10-28 NOTE — ED Provider Notes (Signed)
History     CSN: [id number]  Arrival date & time 10/28/12  2013   First MD Initiated Contact with Patient 10/28/12 2056      Chief Complaint  Patient presents with  . Shortness of Breath    (Consider location/radiation/quality/duration/timing/severity/associated sxs/prior treatment) HPI Comments: Last night patient had vomiting and diarrhea EMS came to home and gave Phenergan  Patient refused transport to hospital today has had fever decreased appetite and intake little urine output, cough.  Has Hx of dysphagia using thickened fluids post CVA and frequent episodes of aspiration   Patient is a 79 y.o. female presenting with shortness of breath. The history is provided by the patient.  Shortness of Breath Severity:  Moderate Onset quality:  Unable to specify Timing:  Constant Progression:  Worsening Chronicity:  Recurrent Associated symptoms: cough, fever and vomiting   Associated symptoms: no chest pain     Past Medical History  Diagnosis Date  . Unspecified sinusitis (chronic)   . Unspecified essential hypertension   . Cerebrovascular disease, unspecified   . Unspecified venous (peripheral) insufficiency   . Pure hypercholesterolemia   . Unspecified disorder of thyroid   . Esophageal reflux   . Unspecified constipation   . Urinary tract infection, site not specified   . Osteoarthrosis, unspecified whether generalized or localized, unspecified site   . Degeneration of cervical intervertebral disc   . Closed fracture of other bone of wrist   . Anxiety state, unspecified   . Idiopathic urticaria   . Anemia, unspecified   . Other B-complex deficiencies   . Herpes zoster without mention of complication     Past Surgical History  Procedure Laterality Date  . Tonsillectomy    . Total abdominal hysterectomy w/ bilateral salpingoophorectomy    . Cervical fusion    . Cataract extraction    . Nasal sinus surgery    . Esophagogastroduodenoscopy  09/10/2011    Procedure:  ESOPHAGOGASTRODUODENOSCOPY (EGD);  Surgeon: Petra Kuba, MD;  Location: [facility] ENDOSCOPY;  Service: Endoscopy;  Laterality: N/A;  c-arm needed  . Savory dilation  09/10/2011    Procedure: SAVORY DILATION;  Surgeon: Petra Kuba, MD;  Location: [facility] ENDOSCOPY;  Service: Endoscopy;  Laterality: N/A;  . Esophagogastroduodenoscopy  05/13/2012    Procedure: ESOPHAGOGASTRODUODENOSCOPY (EGD);  Surgeon: Petra Kuba, MD;  Location: [facility] ENDOSCOPY;  Service: Endoscopy;  Laterality: N/A;  with carm  . Savory dilation  05/13/2012    Procedure: SAVORY DILATION;  Surgeon: Petra Kuba, MD;  Location: [facility] ENDOSCOPY;  Service: Endoscopy;  Laterality: N/A;    Family History  Problem Relation Age of Onset  . Diabetes Father   . Heart failure Mother   . Diabetes Sister     History  Substance Use Topics  . Smoking status: Never Smoker   . Smokeless tobacco: Never Used  . Alcohol Use: No    OB History   Grav Para Term Preterm Abortions TAB SAB Ect Mult Living                  Review of Systems  Constitutional: Positive for fever.  Respiratory: Positive for cough and shortness of breath.   Cardiovascular: Negative for chest pain.  Gastrointestinal: Positive for vomiting and diarrhea.  Skin: Positive for pallor.  Neurological: Positive for weakness.  All other systems reviewed and are negative.    Allergies  Aspirin  Home Medications   Current Outpatient Rx  Name  Route  Sig  Dispense  Refill  .  ALPRAZolam (XANAX) 0.25 MG tablet   Oral   Take 0.125 mg by mouth every 6 (six) hours as needed. For anxiety         . aspirin EC 325 MG tablet   Oral   Take 1 tablet (325 mg total) by mouth daily.   30 tablet   0   . azithromycin (AZASITE) 1 % ophthalmic solution   Both Eyes   Place 1 drop into both eyes every evening.          . cyanocobalamin 1000 MCG tablet   Oral   Take 1,000 mcg by mouth every morning.          . feeding supplement (ENSURE COMPLETE) LIQD   Oral   Take 237 mLs by  mouth daily.         . lansoprazole (PREVACID) 30 MG capsule   Oral   Take 30 mg by mouth every morning.          Marland Kitchen levothyroxine (SYNTHROID, LEVOTHROID) 75 MCG tablet   Oral   Take 75 mcg by mouth every morning.          Marland Kitchen losartan (COZAAR) 50 MG tablet   Oral   Take 1 tablet (50 mg total) by mouth daily.   30 tablet   11   . methylcellulose (ARTIFICIAL TEARS) 1 % ophthalmic solution   Both Eyes   Place 1 drop into both eyes as needed.         . mineral oil liquid   Oral   Take 30 mLs by mouth daily as needed. For constipation         . pindolol (VISKEN) 5 MG tablet   Oral   Take 5 mg by mouth 2 (two) times daily.         . polyethylene glycol powder (GLYCOLAX/MIRALAX) powder   Oral   Take 17 g by mouth daily.         Marland Kitchen senna-docusate (SENOKOT-S) 8.6-50 MG per tablet   Oral   Take 1 tablet by mouth 2 (two) times daily.   30 tablet   1   . simvastatin (ZOCOR) 20 MG tablet   Oral   Take 1 tablet (20 mg total) by mouth daily at 6 PM.   30 tablet   0   . vitamin D, CHOLECALCIFEROL, 400 UNITS tablet   Oral   Take 400 Units by mouth every 7 (seven) days. On Thursdays           BP 119/62  Pulse 66  Temp(Src) 99 F (37.2 C)  Resp 20  Physical Exam  Nursing note and vitals reviewed. Constitutional: She appears well-developed and well-nourished.  HENT:  Head: Normocephalic.  Eyes: Pupils are equal, round, and reactive to light.  Neck: Normal range of motion.  Cardiovascular: Normal rate.   Pulmonary/Chest: Effort normal. She has wheezes. She has rales.  Abdominal: Soft.  Musculoskeletal: Normal range of motion.  Neurological: She is alert.  Skin: Skin is warm. There is pallor.    ED Course  Procedures (including critical care time)  Labs Reviewed  CBC WITH DIFFERENTIAL - Abnormal; Notable for the following:    WBC 14.4 (*)    RBC 3.03 (*)    Hemoglobin 9.6 (*)    HCT 26.2 (*)    MCHC 36.6 (*)    Neutrophils Relative 79 (*)     Neutro Abs 11.4 (*)    All other components within normal limits  BLOOD GAS, ARTERIAL -  Abnormal; Notable for the following:    pO2, Arterial 121.0 (*)    Bicarbonate 26.7 (*)    Acid-Base Excess 2.5 (*)    All other components within normal limits  POCT I-STAT, CHEM 8 - Abnormal; Notable for the following:    Sodium 122 (*)    Chloride 87 (*)    BUN 32 (*)    Creatinine, Ser 1.30 (*)    Glucose, Bld 111 (*)    Hemoglobin 8.8 (*)    HCT 26.0 (*)    All other components within normal limits  URINE CULTURE  URINE RAPID DRUG SCREEN (HOSP PERFORMED)  URINALYSIS, MICROSCOPIC ONLY   Dg Chest 2 View  10/28/2012  *RADIOLOGY REPORT*  Clinical Data: Shortness of breath, cough and weakness.  CHEST - 2 VIEW  Comparison: 08/10/2012.  Findings: Normal sized heart.  Interval mild patchy opacity in the right mid and lower lung zones.  Stable mild prominence of the interstitial markings and clear left lung.  Stable cervical spine fixation hardware.  The bones appear osteopenic.  IMPRESSION:  1.  Right mid and lower lung zone pneumonia. 2.  Stable mild chronic interstitial lung disease.   Original Report Authenticated By: Beckie Salts, M.D.      1. Aspiration pneumonia       MDM  Treated with Rocephin, Azithromycin and Zosyn         Arman Filter, NP 10/28/12 2209  Arman Filter, NP 10/28/12 2209

## 2012-10-28 NOTE — ED Notes (Signed)
Per EMS, pt from [facility] (home).  Pt c/o shortness of breath x 5 days.  Pt Alert not oriented.  ? History unknown.  Vitals:  100/60, 76, resp 16, 98% 4l Marks.  90% RA.  IV 18g LAC in Route.

## 2012-10-28 NOTE — Progress Notes (Addendum)
CSW met with pt and pt daughter at bedside. Pt daughter confirmed patient is a resident at [facility] independent living. 2 weeks prior, pt receiving home health serices from [facility] including rn, aid, pt, ot. CSW and Patient daughter may need to resume home health and look into private duty nursing. CSW provided patient family with list of resources. CSW awaiting further medical evaluation. Pt family hopes for patient to return to [facility] if at all possible request pt/ot evaluation for recommendations.   Catha Gosselin, LCSWA  [phone] .10/28/2012 2145pm

## 2012-10-28 NOTE — H&P (Signed)
History and Physical  NOVIS LEAGUE ZOX:[id number] DOB: Nov 06, 1923 DOA: 10/28/2012  Referring physician: ER   PCP: Michele Mcalpine, MD   Chief Complaint: Shortness of breath  HPI:  Patient is an 79 year old female with past medical history most significant for multiple medical problems like hypertension, recent stroke, venous insufficiency, hypercholesterolemia, reflux disease, constipation, history of osteoarthrosis, and anemia who resides in the independent senior citizen housing comes in today with progressive shortness of breath since last one week. Although patient notes that she has been having shortness of breath for last 1 week the daughters visit the patient every day say that patient was absolutely all right on Sunday when she attended a baby shower for her grandniece. They got really concerned chest last night that his 1 day prior to admission when patient had several episodes of vomiting which contained food particles and loose bowel motions. Patient did not have any fever, chills. She does complain of abdominal pain in the lower abdomen for last 2 days it is associated with difficulty in urination with burning.   In the ER initial workup was suggestive of high WBC count with left shift, positive leukocytes and WBCs in urine analysis, and chest x-ray suggestive of right mid and lower zone pneumonia. Medical service was called to admit the patient.   Review of Systems:  15 point review of system is negative except for as noted above.  Past Medical History  Diagnosis Date  . Unspecified sinusitis (chronic)   . Unspecified essential hypertension   . Cerebrovascular disease, unspecified   . Unspecified venous (peripheral) insufficiency   . Pure hypercholesterolemia   . Unspecified disorder of thyroid   . Esophageal reflux   . Unspecified constipation   . Urinary tract infection, site not specified   . Osteoarthrosis, unspecified whether generalized or localized, unspecified site    . Degeneration of cervical intervertebral disc   . Closed fracture of other bone of wrist   . Anxiety state, unspecified   . Idiopathic urticaria   . Anemia, unspecified   . Other B-complex deficiencies   . Herpes zoster without mention of complication   . Stroke     Past Surgical History  Procedure Laterality Date  . Tonsillectomy    . Total abdominal hysterectomy w/ bilateral salpingoophorectomy    . Cervical fusion    . Cataract extraction    . Nasal sinus surgery    . Esophagogastroduodenoscopy  09/10/2011    Procedure: ESOPHAGOGASTRODUODENOSCOPY (EGD);  Surgeon: Petra Kuba, MD;  Location: [facility] ENDOSCOPY;  Service: Endoscopy;  Laterality: N/A;  c-arm needed  . Savory dilation  09/10/2011    Procedure: SAVORY DILATION;  Surgeon: Petra Kuba, MD;  Location: [facility] ENDOSCOPY;  Service: Endoscopy;  Laterality: N/A;  . Esophagogastroduodenoscopy  05/13/2012    Procedure: ESOPHAGOGASTRODUODENOSCOPY (EGD);  Surgeon: Petra Kuba, MD;  Location: [facility] ENDOSCOPY;  Service: Endoscopy;  Laterality: N/A;  with carm  . Savory dilation  05/13/2012    Procedure: SAVORY DILATION;  Surgeon: Petra Kuba, MD;  Location: [facility] ENDOSCOPY;  Service: Endoscopy;  Laterality: N/A;    Social History:  reports that she has never smoked. She has never used smokeless tobacco. She reports that she does not drink alcohol or use illicit drugs.  Allergies  Allergen Reactions  . Aspirin Hives    High Doses    Family History  Problem Relation Age of Onset  . Diabetes Father   . Heart failure Mother   . Diabetes Sister  Prior to Admission medications   Medication Sig Start Date End Date Taking? Authorizing Provider  ALPRAZolam (XANAX) 0.25 MG tablet Take 0.125 mg by mouth every 6 (six) hours as needed. For anxiety 08/01/12   Nyoka Cowden, MD  aspirin EC 325 MG tablet Take 1 tablet (325 mg total) by mouth daily. 08/19/12   Hollice Espy, MD  azithromycin (AZASITE) 1 % ophthalmic solution Place 1 drop  into both eyes every evening.     Historical Provider, MD  cyanocobalamin 1000 MCG tablet Take 1,000 mcg by mouth every morning.     Historical Provider, MD  feeding supplement (ENSURE COMPLETE) LIQD Take 237 mLs by mouth daily. 04/28/12   Meredeth Ide, MD  lansoprazole (PREVACID) 30 MG capsule Take 30 mg by mouth every morning.     Historical Provider, MD  levothyroxine (SYNTHROID, LEVOTHROID) 75 MCG tablet Take 75 mcg by mouth every morning.     Historical Provider, MD  losartan (COZAAR) 50 MG tablet Take 1 tablet (50 mg total) by mouth daily. 09/03/12   Michele Mcalpine, MD  methylcellulose (ARTIFICIAL TEARS) 1 % ophthalmic solution Place 1 drop into both eyes as needed.    Historical Provider, MD  mineral oil liquid Take 30 mLs by mouth daily as needed. For constipation    Historical Provider, MD  pindolol (VISKEN) 5 MG tablet Take 5 mg by mouth 2 (two) times daily.    Pricilla Riffle, MD  polyethylene glycol powder (GLYCOLAX/MIRALAX) powder Take 17 g by mouth daily.    Historical Provider, MD  senna-docusate (SENOKOT-S) 8.6-50 MG per tablet Take 1 tablet by mouth 2 (two) times daily. 04/28/12   Meredeth Ide, MD  simvastatin (ZOCOR) 20 MG tablet Take 1 tablet (20 mg total) by mouth daily at 6 PM. 08/19/12   Hollice Espy, MD  vitamin D, CHOLECALCIFEROL, 400 UNITS tablet Take 400 Units by mouth every 7 (seven) days. On Thursdays    Historical Provider, MD   Physical Exam: Filed Vitals:   10/28/12 2022 10/28/12 2200  BP: 119/62   Pulse: 66 82  Temp: 99 F (37.2 C)   Resp: 20 19  SpO2:  98%   Physical Exam: General: Vital signs reviewed and noted. Frail and looks her age, in no acute distress; alert, appropriate and cooperative throughout examination.difficult to converse as patient has difficulty hearing   Head: Normocephalic, atraumatic.  Eyes: PERRL, EOMI, No signs of anemia or jaundince.  Nose: Mucous membranes moist, not inflammed, nonerythematous.  Throat: Oropharynx nonerythematous, no  exudate appreciated.   Neck: No deformities, masses, or tenderness noted.Supple, No carotid Bruits, no JVD.  Lungs:  Normal respiratory effort. Patient has diffuse bilateral expiratory wheezing without crackles or rhonchi  Heart: RRR. S1 and S2 normal without gallop, murmur, or rubs.  Abdomen:  BS normoactive. Soft, Nondistended, non-tender.  No masses or organomegaly.  Extremities: No pretibial edema.  Neurologic: A&O X3, CN II - XII are grossly intact. Motor strength is 5/5 in the all 4 extremities, Sensations intact to light touch, Cerebellar signs negative.  Skin: No visible rashes, scars.     Wt Readings from Last 3 Encounters:  09/03/12 117 lb 12.8 oz (53.434 kg)  08/10/12 117 lb 8.1 oz (53.3 kg)  07/21/12 116 lb (52.617 kg)    Labs on Admission:  Basic Metabolic Panel:  Recent Labs Lab 10/28/12 2138  NA 122*  K 3.8  CL 87*  GLUCOSE 111*  BUN 32*  CREATININE 1.30*  CBC:  Recent Labs Lab 10/28/12 2126 10/28/12 2138  WBC 14.4*  --   NEUTROABS 11.4*  --   HGB 9.6* 8.8*  HCT 26.2* 26.0*  MCV 86.5  --   PLT 201  --     BNP (last 3 results)  Recent Labs  08/14/12 1134  PROBNP 1779.0*      Radiological Exams on Admission: Dg Chest 2 View  10/28/2012  *RADIOLOGY REPORT*  Clinical Data: Shortness of breath, cough and weakness.  CHEST - 2 VIEW  Comparison: 08/10/2012.  Findings: Normal sized heart.  Interval mild patchy opacity in the right mid and lower lung zones.  Stable mild prominence of the interstitial markings and clear left lung.  Stable cervical spine fixation hardware.  The bones appear osteopenic.  IMPRESSION:  1.  Right mid and lower lung zone pneumonia. 2.  Stable mild chronic interstitial lung disease.   Original Report Authenticated By: Beckie Salts, M.D.     EKG: Not done   Principal Problem:   Aspiration pneumonia Active Problems:   HYPERCHOLESTEROLEMIA   ANEMIA   HYPERTENSION   CEREBROVASCULAR DISEASE   VENOUS INSUFFICIENCY    CHRONIC OBSTRUCTIVE ASTHMA UNSPECIFIED   DEGENERATIVE DISC DISEASE, CERVICAL SPINE   Hyponatremia   Chronic diastolic CHF (congestive heart failure)   Assessment/Plan Patient is an 79 year old female with past medical history as noted above who is admitted with shortness of breath after she had several episodes of vomiting last night. Her chest x-ray is consistent with pneumonia. Given that patient had episodes of vomiting last night this is most consistent with aspiration pneumonia. I would treat the patient with Zosyn and vancomycin to cover for healthcare associated organisms Gram positives, gram negatives and mouth flora, given that patient has been living in a independent senior citizen living home with close contact to multiple older individuals.  -Start vancomycin and Zosyn -Blood cultures -Sputum cultures -Urine for Legionella and strep -IV fluids for gentle hydration carefully as patient has diastolic congestive heart failure -narrow antibiotics in next 48 hours -Continue albuterol nebulizers every 6 hours  Hyponatremia: This is something chronic and patient has had in the past with values as low as 114. Today patient's sodium is 122. She does not have a diagnosis of SIADH. I will start her on IV fluids normal saline at 75 cc an hour and followup basic metabolic profile in the morning.    Continue all her other home medications.  Code Status: Patient is DO NOT INTUBATE DO NOT RESUSCITATE and this was discussed with her daughters Family Communication: Daughters updated at bedside Disposition Plan/Anticipated LOS: Guarded  Time spent: 75 minutes  Lars Mage, MD  [facility] Team 5  If 7PM-7AM, please contact night-coverage at [email or web address], password [facility] 10/28/2012, 11:20 PM

## 2012-10-28 NOTE — ED Provider Notes (Signed)
Medical screening examination/treatment/procedure(s) were conducted as a shared visit with non-physician practitioner(s) and myself.  I personally evaluated the patient during the encounter   Loren Racer, MD 10/28/12 [phone]

## 2012-10-29 ENCOUNTER — Inpatient Hospital Stay (HOSPITAL_COMMUNITY): Payer: Medicare Other

## 2012-10-29 LAB — CBC WITH DIFFERENTIAL/PLATELET
Eosinophils Relative: 0 % (ref 0–5)
HCT: 23.8 % — ABNORMAL LOW (ref 36.0–46.0)
Lymphocytes Relative: 21 % (ref 12–46)
Lymphs Abs: 2.4 10*3/uL (ref 0.7–4.0)
MCV: 85 fL (ref 78.0–100.0)
Neutro Abs: 8.4 10*3/uL — ABNORMAL HIGH (ref 1.7–7.7)
Platelets: 181 10*3/uL (ref 150–400)
RBC: 2.8 MIL/uL — ABNORMAL LOW (ref 3.87–5.11)
WBC: 11.5 10*3/uL — ABNORMAL HIGH (ref 4.0–10.5)

## 2012-10-29 LAB — SODIUM, URINE, RANDOM: Sodium, Ur: 18 mEq/L

## 2012-10-29 LAB — LEGIONELLA ANTIGEN, URINE

## 2012-10-29 LAB — BASIC METABOLIC PANEL
CO2: 25 mEq/L (ref 19–32)
Calcium: 9 mg/dL (ref 8.4–10.5)
Chloride: 84 mEq/L — ABNORMAL LOW (ref 96–112)
Chloride: 83 mEq/L — ABNORMAL LOW (ref 96–112)
Creatinine, Ser: 0.65 mg/dL (ref 0.50–1.10)
GFR calc Af Amer: 87 mL/min — ABNORMAL LOW (ref >90)
GFR calc Af Amer: 90 mL/min — ABNORMAL LOW (ref >90)
GFR calc non Af Amer: 75 mL/min — ABNORMAL LOW (ref >90)
Glucose, Bld: 113 mg/dL — ABNORMAL HIGH (ref 70–99)
Potassium: 3.2 mEq/L — ABNORMAL LOW (ref 3.5–5.1)
Potassium: 4.1 mEq/L (ref 3.5–5.1)
Sodium: 119 mEq/L — CL (ref 135–145)
Sodium: 121 mEq/L — ABNORMAL LOW (ref 135–145)

## 2012-10-29 MED ORDER — LOSARTAN POTASSIUM 50 MG PO TABS
50.0000 mg | ORAL_TABLET | Freq: Every day | ORAL | Status: DC
  Administered 2012-10-29 – 2012-11-03 (×5): 50 mg via ORAL
  Filled 2012-10-29 (×6): qty 1

## 2012-10-29 MED ORDER — PINDOLOL 5 MG PO TABS
5.0000 mg | ORAL_TABLET | Freq: Two times a day (BID) | ORAL | Status: DC
  Administered 2012-10-29 – 2012-11-03 (×10): 5 mg via ORAL
  Filled 2012-10-29 (×13): qty 1

## 2012-10-29 MED ORDER — HYDROCODONE-ACETAMINOPHEN 5-325 MG PO TABS: 1.0000 | ORAL_TABLET | ORAL | Status: DC | PRN

## 2012-10-29 MED ORDER — CHOLECALCIFEROL 10 MCG (400 UNIT) PO TABS
400.0000 [IU] | ORAL_TABLET | ORAL | Status: DC
  Administered 2012-10-30: 400 [IU] via ORAL
  Filled 2012-10-29: qty 1

## 2012-10-29 MED ORDER — VANCOMYCIN HCL 1000 MG IV SOLR
750.0000 mg | INTRAVENOUS | Status: DC
  Administered 2012-10-29: 750 mg via INTRAVENOUS
  Filled 2012-10-29: qty 750

## 2012-10-29 MED ORDER — LEVOTHYROXINE SODIUM 75 MCG PO TABS
75.0000 ug | ORAL_TABLET | Freq: Every morning | ORAL | Status: DC
  Administered 2012-10-29 – 2012-11-03 (×6): 75 ug via ORAL
  Filled 2012-10-29 (×8): qty 1

## 2012-10-29 MED ORDER — AZITHROMYCIN 1 % OP SOLN: 1.0000 [drp] | Freq: Every evening | OPHTHALMIC | Status: DC

## 2012-10-29 MED ORDER — POTASSIUM CHLORIDE 10 MEQ/100ML IV SOLN
10.0000 meq | INTRAVENOUS | Status: AC
  Administered 2012-10-29 (×5): 10 meq via INTRAVENOUS
  Filled 2012-10-29 (×5): qty 100

## 2012-10-29 MED ORDER — VANCOMYCIN HCL 500 MG IV SOLR
500.0000 mg | Freq: Two times a day (BID) | INTRAVENOUS | Status: DC
  Administered 2012-10-29 – 2012-10-30 (×3): 500 mg via INTRAVENOUS
  Filled 2012-10-29 (×4): qty 500

## 2012-10-29 MED ORDER — SODIUM CHLORIDE 0.9 % IV SOLN
INTRAVENOUS | Status: DC
  Administered 2012-10-29: 01:00:00 via INTRAVENOUS

## 2012-10-29 MED ORDER — PIPERACILLIN-TAZOBACTAM 3.375 G IVPB
3.3750 g | Freq: Three times a day (TID) | INTRAVENOUS | Status: DC
  Administered 2012-10-29 – 2012-11-01 (×10): 3.375 g via INTRAVENOUS
  Filled 2012-10-29 (×11): qty 50

## 2012-10-29 MED ORDER — POLYETHYLENE GLYCOL 3350 17 G PO PACK
17.0000 g | PACK | Freq: Every day | ORAL | Status: DC
  Administered 2012-10-29 – 2012-11-03 (×5): 17 g via ORAL
  Filled 2012-10-29 (×6): qty 1

## 2012-10-29 MED ORDER — SENNOSIDES-DOCUSATE SODIUM 8.6-50 MG PO TABS
1.0000 | ORAL_TABLET | Freq: Two times a day (BID) | ORAL | Status: DC
  Administered 2012-10-30 – 2012-11-03 (×8): 1 via ORAL
  Filled 2012-10-29 (×12): qty 1

## 2012-10-29 MED ORDER — ONDANSETRON HCL 4 MG/2ML IJ SOLN: 4.0000 mg | Freq: Four times a day (QID) | INTRAMUSCULAR | Status: DC | PRN

## 2012-10-29 MED ORDER — PANTOPRAZOLE SODIUM 20 MG PO TBEC
20.0000 mg | DELAYED_RELEASE_TABLET | Freq: Every day | ORAL | Status: DC
  Administered 2012-10-29 – 2012-11-02 (×4): 20 mg via ORAL
  Filled 2012-10-29 (×6): qty 1

## 2012-10-29 MED ORDER — SODIUM CHLORIDE 0.9 % IJ SOLN
3.0000 mL | Freq: Two times a day (BID) | INTRAMUSCULAR | Status: DC
Start: 2012-10-29 — End: 2012-11-03

## 2012-10-29 MED ORDER — METHYLCELLULOSE 1 % OP SOLN: 1.0000 [drp] | OPHTHALMIC | Status: DC | PRN

## 2012-10-29 MED ORDER — SODIUM CHLORIDE 0.9 % IV SOLN
INTRAVENOUS | Status: DC
  Administered 2012-10-30 – 2012-11-03 (×5): via INTRAVENOUS

## 2012-10-29 MED ORDER — ALPRAZOLAM 0.25 MG PO TABS
0.1250 mg | ORAL_TABLET | Freq: Four times a day (QID) | ORAL | Status: DC | PRN
  Administered 2012-11-01: 0.125 mg via ORAL
  Filled 2012-10-29: qty 1

## 2012-10-29 MED ORDER — VITAMIN B-12 1000 MCG PO TABS
1000.0000 ug | ORAL_TABLET | Freq: Every morning | ORAL | Status: DC
  Administered 2012-10-29 – 2012-11-01 (×3): 1000 ug via ORAL
  Filled 2012-10-29 (×4): qty 1

## 2012-10-29 MED ORDER — POLYVINYL ALCOHOL 1.4 % OP SOLN
1.0000 [drp] | OPHTHALMIC | Status: DC | PRN
  Administered 2012-10-29 – 2012-11-02 (×2): 1 [drp] via OPHTHALMIC
  Filled 2012-10-29: qty 15

## 2012-10-29 MED ORDER — ENOXAPARIN SODIUM 40 MG/0.4ML ~~LOC~~ SOLN
40.0000 mg | SUBCUTANEOUS | Status: DC
  Administered 2012-10-29 – 2012-11-03 (×5): 40 mg via SUBCUTANEOUS
  Filled 2012-10-29 (×6): qty 0.4

## 2012-10-29 MED ORDER — RESOURCE THICKENUP CLEAR PO POWD
ORAL | Status: DC | PRN
  Filled 2012-10-29: qty 125

## 2012-10-29 MED ORDER — SENNA 8.6 MG PO TABS
1.0000 | ORAL_TABLET | Freq: Two times a day (BID) | ORAL | Status: DC
  Administered 2012-10-30 – 2012-11-02 (×5): 8.6 mg via ORAL
  Filled 2012-10-29 (×6): qty 1

## 2012-10-29 MED ORDER — POLYETHYLENE GLYCOL 3350 17 GM/SCOOP PO POWD
17.0000 g | Freq: Every day | ORAL | Status: DC
  Filled 2012-10-29: qty 255

## 2012-10-29 MED ORDER — ASPIRIN EC 325 MG PO TBEC
325.0000 mg | DELAYED_RELEASE_TABLET | Freq: Every day | ORAL | Status: DC
  Administered 2012-11-01 – 2012-11-02 (×2): 325 mg via ORAL
  Filled 2012-10-29 (×6): qty 1

## 2012-10-29 MED ORDER — ENSURE COMPLETE PO LIQD
237.0000 mL | Freq: Every day | ORAL | Status: DC
  Administered 2012-10-29 – 2012-11-03 (×5): 237 mL via ORAL

## 2012-10-29 MED ORDER — ONDANSETRON HCL 4 MG PO TABS: 4.0000 mg | ORAL_TABLET | Freq: Four times a day (QID) | ORAL | Status: DC | PRN

## 2012-10-29 MED ORDER — SIMVASTATIN 20 MG PO TABS
20.0000 mg | ORAL_TABLET | Freq: Every day | ORAL | Status: DC
  Administered 2012-10-29 – 2012-11-02 (×4): 20 mg via ORAL
  Filled 2012-10-29 (×6): qty 1

## 2012-10-29 NOTE — Progress Notes (Signed)
INITIAL NUTRITION ASSESSMENT  Pt meets criteria for severe MALNUTRITION in the context of chronic illness as evidenced by 10.9% weight loss in the past 5-6 months in addition to pt with visible severe muscle wasting and subcutaneous fat loss in upper arm, temple, and clavicle bone region.  DOCUMENTATION CODES Per approved criteria  -Severe malnutrition in the context of chronic illness   INTERVENTION: - Recommend Ensure Complete BID when diet advanced - Will continue to monitor   NUTRITION DIAGNOSIS: Inadequate oral intake related to inability to eat as evidenced by NPO.   Goal: Advance diet as tolerated to safe textured diet for pt  Monitor:  Weights, labs, diet advancement, dysphagia   Reason for Assessment: Nutrition risk   79 y.o. female  Admitting Dx: Aspiration pneumonia  ASSESSMENT: Patient is an 79 year old female with past medical history most significant for multiple medical problems like hypertension, recent stroke, venous insufficiency, hypercholesterolemia, reflux disease, constipation, history of osteoarthrosis, and anemia who resides in the independent senior citizen housing comes in today with progressive shortness of breath since last one week. Pt has history of dysphagia using thickened liquids s/p CVA and has had frequent episodes of aspiration.   Met with pt today who reports eating mostly 2 meals/day PTA and drinking 1-2 Ensure/day with good appetite. Pt reports she has lost 14 pounds unintentionally in the past 5-6 months. Pt denies any problems chewing but has problems swallowing. Pt had bedside SLP evaluation today which showed pt with high aspiration risk and MBS was recommended and ordered. Pt with loose BM this morning. No vomiting reported today.   Performed nutrition focused physical exam.   Nutrition Focused Physical Exam:  Subcutaneous Fat:  Orbital Region: mild/moderate wasting Upper Arm Region: severe wasting Thoracic and Lumbar Region:  mild/moderate wasting  Muscle:  Temple Region: severe wasting Clavicle Bone Region: severe wasting Clavicle and Acromion Bone Region: severe wasting Scapular Bone Region: severe wasting Dorsal Hand: mild/moderate wasting Patellar Region: mild/moderate wasting Anterior Thigh Region: mild/moderate wasting Posterior Calf Region: severe wasting  Edema: None noted   Height: Ht Readings from Last 1 Encounters:  10/29/12 5\' 4"  (1.626 m)    Weight: Wt Readings from Last 1 Encounters:  10/29/12 114 lb 10.2 oz (52 kg)    Ideal Body Weight: 120 lb  % Ideal Body Weight: 95  Wt Readings from Last 10 Encounters:  10/29/12 114 lb 10.2 oz (52 kg)  09/03/12 117 lb 12.8 oz (53.434 kg)  08/10/12 117 lb 8.1 oz (53.3 kg)  07/21/12 116 lb (52.617 kg)  05/22/12 114 lb (51.71 kg)  04/27/12 117 lb 11.6 oz (53.4 kg)  03/10/12 112 lb 9.6 oz (51.075 kg)  12/10/11 118 lb 9.6 oz (53.797 kg)  10/09/11 117 lb 3.2 oz (53.162 kg)  09/05/11 111 lb 12.8 oz (50.712 kg)    Usual Body Weight: 117 lb  % Usual Body Weight: 97  BMI:  Body mass index is 19.67 kg/(m^2).  Estimated Nutritional Needs: Kcal: 1350-1550 Protein: 55-65g Fluid: 1.3-1.5L/day  Skin: Intact  Diet Order: NPO  EDUCATION NEEDS: -No education needs identified at this time   Intake/Output Summary (Last 24 hours) at 10/29/12 1321 Last data filed at 10/29/12 0627  Gross per 24 hour  Intake 598.75 ml  Output    150 ml  Net 448.75 ml    Last BM: 3/20, loose brown small   Labs:   Recent Labs Lab 10/28/12 2138 10/29/12 0403 10/29/12 1125  NA 122* 119* 121*  K 3.8 3.1*  3.2*  CL 87* 84* 86*  CO2  --  25 26  BUN 32* 29* 25*  CREATININE 1.30* 0.97 0.73  CALCIUM  --  9.0 8.8  GLUCOSE 111* 113* 109*    CBG (last 3)  No results found for this basename: GLUCAP,  in the last 72 hours  Scheduled Meds: . aspirin EC  325 mg Oral Daily  . azithromycin  1 drop Both Eyes QPM  . [START ON 10/30/2012] cholecalciferol   400 Units Oral Q7 days  . enoxaparin (LOVENOX) injection  40 mg Subcutaneous Q24H  . feeding supplement  237 mL Oral Daily  . levothyroxine  75 mcg Oral q morning - 10a  . losartan  50 mg Oral Daily  . pantoprazole  20 mg Oral Daily  . pindolol  5 mg Oral BID  . piperacillin-tazobactam (ZOSYN)  IV  3.375 g Intravenous Q8H  . polyethylene glycol  17 g Oral Daily  . potassium chloride  10 mEq Intravenous Q1 Hr x 5  . senna  1 tablet Oral BID  . senna-docusate  1 tablet Oral BID  . simvastatin  20 mg Oral q1800  . sodium chloride  3 mL Intravenous Q12H  . vancomycin  500 mg Intravenous Q12H  . cyanocobalamin  1,000 mcg Oral q morning - 10a    Continuous Infusions: . sodium chloride 100 mL/hr at 10/29/12 0600    Past Medical History  Diagnosis Date  . Unspecified sinusitis (chronic)   . Unspecified essential hypertension   . Cerebrovascular disease, unspecified   . Unspecified venous (peripheral) insufficiency   . Pure hypercholesterolemia   . Unspecified disorder of thyroid   . Esophageal reflux   . Unspecified constipation   . Urinary tract infection, site not specified   . Osteoarthrosis, unspecified whether generalized or localized, unspecified site   . Degeneration of cervical intervertebral disc   . Closed fracture of other bone of wrist   . Anxiety state, unspecified   . Idiopathic urticaria   . Anemia, unspecified   . Other B-complex deficiencies   . Herpes zoster without mention of complication   . Stroke     Past Surgical History  Procedure Laterality Date  . Tonsillectomy    . Total abdominal hysterectomy w/ bilateral salpingoophorectomy    . Cervical fusion    . Cataract extraction    . Nasal sinus surgery    . Esophagogastroduodenoscopy  09/10/2011    Procedure: ESOPHAGOGASTRODUODENOSCOPY (EGD);  Surgeon: Petra Kuba, MD;  Location: [facility] ENDOSCOPY;  Service: Endoscopy;  Laterality: N/A;  c-arm needed  . Savory dilation  09/10/2011    Procedure: SAVORY  DILATION;  Surgeon: Petra Kuba, MD;  Location: [facility] ENDOSCOPY;  Service: Endoscopy;  Laterality: N/A;  . Esophagogastroduodenoscopy  05/13/2012    Procedure: ESOPHAGOGASTRODUODENOSCOPY (EGD);  Surgeon: Petra Kuba, MD;  Location: [facility] ENDOSCOPY;  Service: Endoscopy;  Laterality: N/A;  with carm  . Savory dilation  05/13/2012    Procedure: SAVORY DILATION;  Surgeon: Petra Kuba, MD;  Location: [facility] ENDOSCOPY;  Service: Endoscopy;  Laterality: N/A;     Levon Hedger MS, RD, LDN [phone] Pager [phone] After Hours Pager

## 2012-10-29 NOTE — Progress Notes (Signed)
Speech Language Pathology Dysphagia Treatment Patient Details Name: Margaret [name] MRN: [id number] DOB: October 07, 1923 Today's Date: 10/29/2012 Time: 4098-1191 SLP Time Calculation (min): 29 min  Assessment / Plan / Recommendation Clinical Impression  Pt seen to educate pt to results of MBS, dietary changes made and compensatory strategies to decrease amount pt may aspirate.  SLP provided written compensatory strategies to pt and educated her that pt will likely aspirate chronically.  Pt admits that her dysphagia is contributing factor to her weight loss.  SLP further modified order to room service with assistance to allow pt to order her meals.  Had her meats ground via dietary.     Teachback used to assure pt understands information, and severity of dysphagia.  Rec full supervision initially to assure pt is using compensatory strategies and tolerating as best able.  SLP to follow briefly.      Diet Recommendation  Continue with Current Diet: Dysphagia 3 (mechanical soft);Thin liquid (with accepted aspiration risk)    SLP Plan Continue with current plan of care   Pertinent Vitals/Pain Afebrile, decreased - congested weak cough   Swallowing Goals  SLP Swallowing Goals Patient will utilize recommended strategies during swallow to increase swallowing safety with: Moderate assistance Swallow Study Goal #2 - Progress: Progressing toward goal  General Temperature Spikes Noted: No Respiratory Status: Supplemental O2 delivered via (comment) Behavior/Cognition: Alert;Cooperative;Hard of hearing Oral Cavity - Dentition: Adequate natural dentition (few lower posterior dentition missing) Patient Positioning: Upright in chair  Oral Cavity - Oral Hygiene Does patient have any of the following "at risk" factors?: Diet - patient on thickened liquids;Oxygen therapy - cannula, mask, simple oxygen devices Patient is AT RISK - Oral Care Protocol followed (see row info): Yes   Dysphagia  Treatment Treatment focused on: Patient/family/caregiver education Family/Caregiver Educated: pt Treatment Methods/Modalities: Other (comment) Patient observed directly with PO's: No Feeding: Able to feed self   GO     Donavan Burnet, MS [facility] SLP [phone]

## 2012-10-29 NOTE — Progress Notes (Signed)
CRITICAL VALUE ALERT  Critical value received: Sodium 119  Date of notification:  10/29/12  Time of notification:  0455  Critical value read back:yes  Nurse who received alert:  Loney Hering  MD notified (1st page):  Dr. Mitchel Honour  Time of first page:  0500  MD notified (2nd page):  Time of second page:  Responding MD:  Dr. Mitchel Honour  Time MD responded:  [phone]

## 2012-10-29 NOTE — Procedures (Signed)
Objective Swallowing Evaluation: Modified Barium Swallowing Study  Patient Details  Name: FAHMIDA JURICH MRN: [id number] Date of Birth: 07/21/1924  Today's Date: 10/29/2012 Time: 1210-1246 SLP Time Calculation (min): 36 min  Past Medical History:  Past Medical History  Diagnosis Date  . Unspecified sinusitis (chronic)   . Unspecified essential hypertension   . Cerebrovascular disease, unspecified   . Unspecified venous (peripheral) insufficiency   . Pure hypercholesterolemia   . Unspecified disorder of thyroid   . Esophageal reflux   . Unspecified constipation   . Urinary tract infection, site not specified   . Osteoarthrosis, unspecified whether generalized or localized, unspecified site   . Degeneration of cervical intervertebral disc   . Closed fracture of other bone of wrist   . Anxiety state, unspecified   . Idiopathic urticaria   . Anemia, unspecified   . Other B-complex deficiencies   . Herpes zoster without mention of complication   . Stroke    Past Surgical History:  Past Surgical History  Procedure Laterality Date  . Tonsillectomy    . Total abdominal hysterectomy w/ bilateral salpingoophorectomy    . Cervical fusion    . Cataract extraction    . Nasal sinus surgery    . Esophagogastroduodenoscopy  09/10/2011    Procedure: ESOPHAGOGASTRODUODENOSCOPY (EGD);  Surgeon: Petra Kuba, MD;  Location: [facility] ENDOSCOPY;  Service: Endoscopy;  Laterality: N/A;  c-arm needed  . Savory dilation  09/10/2011    Procedure: SAVORY DILATION;  Surgeon: Petra Kuba, MD;  Location: [facility] ENDOSCOPY;  Service: Endoscopy;  Laterality: N/A;  . Esophagogastroduodenoscopy  05/13/2012    Procedure: ESOPHAGOGASTRODUODENOSCOPY (EGD);  Surgeon: Petra Kuba, MD;  Location: [facility] ENDOSCOPY;  Service: Endoscopy;  Laterality: N/A;  with carm  . Savory dilation  05/13/2012    Procedure: SAVORY DILATION;  Surgeon: Petra Kuba, MD;  Location: [facility] ENDOSCOPY;  Service: Endoscopy;  Laterality: N/A;   HPI:  79  yo female adm to [facility] with asp pna- episode of vomiting of food particles and loose bowels.   Pt has a complex med hx including HTN, recent CVA, arthritis, anemia, thyroid d/o, anxiety, and reflux.  Pt PSH + for multiple dilatations of esophagus - last being 05/13/2012.  DG esophagus 04/25/2012 moderate to severe delay in contrast- decreased motility - took 20 minutes for esophagus to clear.  CXR 10/28/12 showed right mid and lower lobe pna.  Pt admits to dysphagia for years for which she compensates by taking small bites/sips, chewing well, etc.  She denies dysphagia worsening.  She also admits to sore throat and several "colds" in the last few years.          Assessment / Plan / Recommendation Clinical Impression  Dysphagia Diagnosis: Severe cervical esophageal phase dysphagia;Moderate pharyngeal phase dysphagia;Mild oral phase dysphagia   Clinical impression: Pt presents with mild oral, moderate pharyngeal and severe cervical esophageal dysphagia resulting in gross pharyngeal stasis WITHOUT pt awareness/sensation.  Suspect her dysphagia is multifactorial - due to known h/o CVA impacting oropharyngeal swallow and known h/o significant esophageal deficit.  Although pt did not overtly aspirate, she is at very high risk due to severity of stasis and poor ability to clear- even with dry swallows.  Pt required 5-6 swallows to decreased pyriform/cp residual of small bolus of thin liquid to mild amount - mixing with secretions.  UES opens only minimally and suspected decr laryngeal elevation/closure contributes to this issue as well as ACDF hardware at C6-C7 impinging mildly.  Initial swallows  of pudding and cracker resulted in no movement of consistencies into esophagus from pharynx- - nearly absent muscular contraction, UES opening noted.  100% of pudding/cracker bolus lodged in pharynx at vallecular/pyriform/cp space without pt sensation.  Swallows of thin aided clearance of solids but resulted in stasis of  liquids again with sensory deficits.  Esophageal clearance also appeared compromised, pt has known h/o motility issues.    Suspect pt has been aspirating chronically with tolerance until this medical event.  Unfortuantely due to pharyngeal sensorimotor deficits and chronic nature of dysphagia,  prognosis for safe efficient swallow is poor.   Aspiration risk will be ongoing and exacerbated with acute medical illnesses with decreased tolerance due to advanced age and weakness.    Suspect pt's dysphagia may contribute to her weight loss.   If MD desires to pt to continue po with known risk, thinner liquids will be best tolerated if aspirated and will decrease amount of pharyngeal stasis.  Soft foods that pt can masticate very thoroughly may provide pt with QOL.    SLP educated pt to findings, recommendations during testing but pt will benefit from reinforcement of information.  SLP to follow for education of pt, family to mitigation of aspiration/dysphagia.  Thanks for this referral.        Treatment Recommendation  Therapy as outlined in treatment plan below    Diet Recommendation NPO;Dysphagia 3 (Mechanical Soft);Thin liquid (npo or dys3/GROUND MEATS/thin with risks)   Liquid Administration via: Cup;Straw Medication Administration: Crushed with puree Supervision: Patient able to self feed Compensations: Slow rate;Small sips/bites;Follow solids with liquid;Multiple dry swallows after each bite/sip Postural Changes and/or Swallow Maneuvers: Seated upright 90 degrees;Upright 30-60 min after meal    Other  Recommendations Recommended Consults: MBS Oral Care Recommendations: Oral care QID   Follow Up Recommendations  None    Frequency and Duration min 2x/week  2 weeks   Pertinent Vitals/Pain Congested cough, afebrile    SLP Swallow Goals Patient will utilize recommended strategies during swallow to increase swallowing safety with: Moderate assistance   General HPI: 79 yo female adm to [facility]  with asp pna- episode of vomiting of food particles and loose bowels.   Pt has a complex med hx including HTN, recent CVA, arthritis, anemia, thyroid d/o, anxiety, and reflux.  Pt PSH + for multiple dilatations of esophagus - last being 05/13/2012.  DG esophagus 04/25/2012 moderate to severe delay in contrast- decreased motility - took 20 minutes for esophagus to clear.  CXR 10/28/12 showed right mid and lower lobe pna.  Pt admits to dysphagia for years for which she compensates by taking small bites/sips, chewing well, etc.  She denies dysphagia worsening.  She also admits to sore throat and several "colds" in the last few years.      Type of Study: Modified Barium Swallowing Study Reason for Referral: Objectively evaluate swallowing function Diet Prior to this Study: Regular;Honey-thick liquids Temperature Spikes Noted: No Respiratory Status: Supplemental O2 delivered via (comment) History of Recent Intubation: No Behavior/Cognition: Alert;Cooperative;Hard of hearing;Pleasant mood Oral Cavity - Dentition: Adequate natural dentition Oral Motor / Sensory Function: Impaired - see Bedside swallow eval Self-Feeding Abilities: Able to feed self Patient Positioning: Upright in chair Baseline Vocal Quality: Hoarse;Low vocal intensity Volitional Cough: Weak;Congested Volitional Swallow: Able to elicit Anatomy: Within functional limits (cervical hardware noted at C6-C7) Pharyngeal Secretions: Not observed secondary MBS    Reason for Referral Objectively evaluate swallowing function   Oral Phase Oral Preparation/Oral Phase Oral Phase: WFL Oral -  Nectar Oral - Nectar Cup: Reduced posterior propulsion (decreased tongue baser retraction) Oral - Thin Oral - Thin Teaspoon: Reduced posterior propulsion Oral - Thin Cup: Reduced posterior propulsion Oral - Thin Straw: Reduced posterior propulsion Oral - Solids Oral - Puree: Reduced posterior propulsion Oral - Regular: Reduced posterior propulsion Oral  Phase - Comment Oral Phase - Comment: decreased tongue base retraction noted, suspect decr intraoral pressure to help propel barium through esophagus   Pharyngeal Phase Pharyngeal Phase Pharyngeal Phase: Impaired Pharyngeal - Nectar Pharyngeal - Nectar Cup: Pharyngeal residue - pyriform sinuses;Reduced anterior laryngeal mobility;Reduced epiglottic inversion;Reduced pharyngeal peristalsis;Reduced laryngeal elevation;Reduced airway/laryngeal closure;Reduced tongue base retraction;Pharyngeal residue - cp segment Pharyngeal - Thin Pharyngeal - Thin Teaspoon: Reduced pharyngeal peristalsis;Reduced epiglottic inversion;Reduced anterior laryngeal mobility;Reduced laryngeal elevation;Reduced airway/laryngeal closure;Reduced tongue base retraction;Pharyngeal residue - cp segment;Pharyngeal residue - pyriform sinuses Pharyngeal - Thin Cup: Penetration/Aspiration during swallow;Reduced pharyngeal peristalsis;Reduced epiglottic inversion;Reduced anterior laryngeal mobility;Reduced airway/laryngeal closure;Reduced tongue base retraction;Pharyngeal residue - cp segment;Pharyngeal residue - pyriform sinuses Penetration/Aspiration details (thin cup): Material enters airway, remains ABOVE vocal cords and not ejected out Pharyngeal - Thin Straw: Reduced airway/laryngeal closure;Reduced laryngeal elevation;Reduced pharyngeal peristalsis;Reduced epiglottic inversion;Reduced anterior laryngeal mobility;Reduced tongue base retraction;Pharyngeal residue - cp segment;Pharyngeal residue - pyriform sinuses Pharyngeal - Solids Pharyngeal - Puree: Pharyngeal residue - pyriform sinuses;Pharyngeal residue - cp segment;Reduced laryngeal elevation;Reduced airway/laryngeal closure;Reduced tongue base retraction;Penetration/Aspiration before swallow Pharyngeal - Regular: Reduced tongue base retraction;Reduced airway/laryngeal closure;Reduced laryngeal elevation;Pharyngeal residue - cp segment;Pharyngeal residue - pyriform  sinuses Pharyngeal Phase - Comment Pharyngeal Comment: pt with essentially absent swallow/muscular contraction at times - multifactorial, gross pyriform/cp stasis across consistencies with very poor UES opening - dry swallows aided clearance but pt had to swallow 4-5 times to clear small single cup boluses, head turn right or left and chin tuck not helpful to decr stasis  Cervical Esophageal Phase    GO    Cervical Esophageal Phase Cervical Esophageal Phase: Impaired Cervical Esophageal Phase - Nectar Nectar Cup: Reduced cricopharyngeal relaxation Cervical Esophageal Phase - Thin Thin Teaspoon: Reduced cricopharyngeal relaxation Thin Cup: Reduced cricopharyngeal relaxation Thin Straw: Reduced cricopharyngeal relaxation Cervical Esophageal Phase - Solids Puree: Reduced cricopharyngeal relaxation Regular: Reduced cricopharyngeal relaxation Cervical Esophageal Phase - Comment Cervical Esophageal Comment: Very poor UES opening, with barium filling pyriform sinus - it appears barium leaks into esophagus- UES noted to open with pt belch better than during swallow, ? appearance of CP bar          Donavan Burnet, MS [facility] SLP [phone]

## 2012-10-29 NOTE — Evaluation (Signed)
Clinical/Bedside Swallow Evaluation Patient Details  Name: Margaret [name] MRN: [id number] Date of Birth: Apr 14, 1924  Today's Date: 10/29/2012 Time: 5284-1324 SLP Time Calculation (min): 30 min  Past Medical History:  Past Medical History  Diagnosis Date  . Unspecified sinusitis (chronic)   . Unspecified essential hypertension   . Cerebrovascular disease, unspecified   . Unspecified venous (peripheral) insufficiency   . Pure hypercholesterolemia   . Unspecified disorder of thyroid   . Esophageal reflux   . Unspecified constipation   . Urinary tract infection, site not specified   . Osteoarthrosis, unspecified whether generalized or localized, unspecified site   . Degeneration of cervical intervertebral disc   . Closed fracture of other bone of wrist   . Anxiety state, unspecified   . Idiopathic urticaria   . Anemia, unspecified   . Other B-complex deficiencies   . Herpes zoster without mention of complication   . Stroke    Past Surgical History:  Past Surgical History  Procedure Laterality Date  . Tonsillectomy    . Total abdominal hysterectomy w/ bilateral salpingoophorectomy    . Cervical fusion    . Cataract extraction    . Nasal sinus surgery    . Esophagogastroduodenoscopy  09/10/2011    Procedure: ESOPHAGOGASTRODUODENOSCOPY (EGD);  Surgeon: Petra Kuba, MD;  Location: [facility] ENDOSCOPY;  Service: Endoscopy;  Laterality: N/A;  c-arm needed  . Savory dilation  09/10/2011    Procedure: SAVORY DILATION;  Surgeon: Petra Kuba, MD;  Location: [facility] ENDOSCOPY;  Service: Endoscopy;  Laterality: N/A;  . Esophagogastroduodenoscopy  05/13/2012    Procedure: ESOPHAGOGASTRODUODENOSCOPY (EGD);  Surgeon: Petra Kuba, MD;  Location: [facility] ENDOSCOPY;  Service: Endoscopy;  Laterality: N/A;  with carm  . Savory dilation  05/13/2012    Procedure: SAVORY DILATION;  Surgeon: Petra Kuba, MD;  Location: [facility] ENDOSCOPY;  Service: Endoscopy;  Laterality: N/A;   HPI:  79 yo female adm to [facility] with asp  pna- episode of vomiting of food particles and loose bowels.   Pt has a complex med hx including HTN, recent CVA, arthritis, anemia, thyroid d/o, anxiety, and reflux.  Pt PSH + for multiple dilatations of esophagus - last being 05/13/2012.  DG esophagus 04/25/2012 moderate to severe delay in contrast- decreased motility - took 20 minutes for esophagus to clear.  CXR 10/28/12 showed right mid and lower lobe pna.  Pt admits to dysphagia for years for which she compensates by taking small bites/sips, chewing well, etc.  She denies dysphagia worsening.  She also admits to sore throat and several "colds" in the last few years.        Assessment / Plan / Recommendation Clinical Impression  Pt presents with suspected multifactorial dysphagia with component of both oropharyngeal and known esophageal deficits.  Pt with weak congested cough with inability to clear her secretions and dysphonia.  Observed her to consume tsps of honey thick ensure and thin water - decreased laryngeal elevation palpated at bedside and indications of probable aspiration.  Suspect primary aspiration risk is from known esophageal deficits, but possible oropharyngeal component present.   Rec consider MBS due to pt's known cva hx, right facial.labial CN deficit, cervical spine issues to provide compensatory strategies that may mitigate her dysphagia.  Suspect aspiration will not be prevented in this pt with chronic dysphagia with decreased tolerance when acutely ill due to decreased reserve.    SLP educated pt to results of previous tests, today's findings, strategies to decrease risk and plan for MBS -  she is agreeable.  Order obtained.    Thanks for this consult.     Aspiration Risk    High and suspect chronic   Diet Recommendation NPO        Other  Recommendations Recommended Consults: MBS   Follow Up Recommendations       Frequency and Duration min 2x/week  2 weeks   Pertinent Vitals/Pain Afebrile, congested weak  nonprotective cough    SLP Swallow Goals Patient will utilize recommended strategies during swallow to increase swallowing safety with: Moderate assistance   Swallow Study Prior Functional Status   h/o chronic dysphagia    General HPI: 79 yo female adm to [facility] with asp pna- episode of vomiting of food particles and loose bowels.   Pt has a complex med hx including HTN, recent CVA, arthritis, anemia, thyroid d/o, anxiety, and reflux.  Pt PSH + for multiple dilatations of esophagus - last being 05/13/2012.  DG esophagus 04/25/2012 moderate to severe delay in contrast- decreased motility - took 20 minutes for esophagus to clear.  CXR 10/28/12 showed right mid and lower lobe pna.  Pt admits to dysphagia for years for which she compensates by taking small bites/sips, chewing well, etc.  She denies dysphagia worsening.  She also admits to sore throat and several "colds" in the last few years.      Type of Study: Bedside swallow evaluation Diet Prior to this Study: Regular;Honey-thick liquids Temperature Spikes Noted: No Respiratory Status: Supplemental O2 delivered via (comment) History of Recent Intubation: No Behavior/Cognition: Alert;Cooperative;Hard of hearing;Pleasant mood Self-Feeding Abilities: Able to feed self Patient Positioning: Upright in bed Baseline Vocal Quality: Hoarse;Low vocal intensity Volitional Cough: Weak;Congested Volitional Swallow: Able to elicit    Oral/Motor/Sensory Function Labial Strength: Reduced (right) Facial ROM: Reduced right (slight decreased motility on right) Velum: Within Functional Limits Mandible: Within Functional Limits   Ice Chips Ice chips: Not tested   Thin Liquid Thin Liquid: Impaired Presentation: Spoon Pharyngeal  Phase Impairments: Suspected delayed Swallow;Decreased hyoid-laryngeal movement;Cough - Delayed    Nectar Thick Nectar Thick Liquid: Not tested   Honey Thick Honey Thick Liquid: Impaired Presentation: Spoon Pharyngeal Phase  Impairments: Suspected delayed Swallow;Decreased hyoid-laryngeal movement;Cough - Immediate;Multiple swallows   Puree Puree: Not tested   Solid   GO    Solid: Not tested       Donavan Burnet, MS [facility] SLP [phone]

## 2012-10-29 NOTE — ED Provider Notes (Signed)
History     CSN: [id number]  Arrival date & time 10/28/12  2013   First MD Initiated Contact with Patient 10/28/12 2056      Chief Complaint  Patient presents with  . Shortness of Breath    (Consider location/radiation/quality/duration/timing/severity/associated sxs/prior treatment) HPI  Past Medical History  Diagnosis Date  . Unspecified sinusitis (chronic)   . Unspecified essential hypertension   . Cerebrovascular disease, unspecified   . Unspecified venous (peripheral) insufficiency   . Pure hypercholesterolemia   . Unspecified disorder of thyroid   . Esophageal reflux   . Unspecified constipation   . Urinary tract infection, site not specified   . Osteoarthrosis, unspecified whether generalized or localized, unspecified site   . Degeneration of cervical intervertebral disc   . Closed fracture of other bone of wrist   . Anxiety state, unspecified   . Idiopathic urticaria   . Anemia, unspecified   . Other B-complex deficiencies   . Herpes zoster without mention of complication   . Stroke     Past Surgical History  Procedure Laterality Date  . Tonsillectomy    . Total abdominal hysterectomy w/ bilateral salpingoophorectomy    . Cervical fusion    . Cataract extraction    . Nasal sinus surgery    . Esophagogastroduodenoscopy  09/10/2011    Procedure: ESOPHAGOGASTRODUODENOSCOPY (EGD);  Surgeon: Petra Kuba, MD;  Location: [facility] ENDOSCOPY;  Service: Endoscopy;  Laterality: N/A;  c-arm needed  . Savory dilation  09/10/2011    Procedure: SAVORY DILATION;  Surgeon: Petra Kuba, MD;  Location: [facility] ENDOSCOPY;  Service: Endoscopy;  Laterality: N/A;  . Esophagogastroduodenoscopy  05/13/2012    Procedure: ESOPHAGOGASTRODUODENOSCOPY (EGD);  Surgeon: Petra Kuba, MD;  Location: [facility] ENDOSCOPY;  Service: Endoscopy;  Laterality: N/A;  with carm  . Savory dilation  05/13/2012    Procedure: SAVORY DILATION;  Surgeon: Petra Kuba, MD;  Location: [facility] ENDOSCOPY;  Service: Endoscopy;   Laterality: N/A;    Family History  Problem Relation Age of Onset  . Diabetes Father   . Heart failure Mother   . Diabetes Sister     History  Substance Use Topics  . Smoking status: Never Smoker   . Smokeless tobacco: Never Used  . Alcohol Use: No    OB History   Grav Para Term Preterm Abortions TAB SAB Ect Mult Living                  Review of Systems  Allergies  Aspirin  Home Medications  No current outpatient prescriptions on file.  BP 150/65  Pulse 100  Temp(Src) 98.6 F (37 C) (Oral)  Resp 18  Ht 5\' 4"  (1.626 m)  Wt 114 lb 10.2 oz (52 kg)  BMI 19.67 kg/m2  SpO2 93%  Physical Exam  ED Course  Procedures (including critical care time)  Labs Reviewed  CBC WITH DIFFERENTIAL - Abnormal; Notable for the following:    WBC 14.4 (*)    RBC 3.03 (*)    Hemoglobin 9.6 (*)    HCT 26.2 (*)    MCHC 36.6 (*)    Neutrophils Relative 79 (*)    Neutro Abs 11.4 (*)    All other components within normal limits  BLOOD GAS, ARTERIAL - Abnormal; Notable for the following:    pO2, Arterial 121.0 (*)    Bicarbonate 26.7 (*)    Acid-Base Excess 2.5 (*)    All other components within normal limits  URINALYSIS, MICROSCOPIC ONLY -  Abnormal; Notable for the following:    APPearance CLOUDY (*)    Hgb urine dipstick MODERATE (*)    Leukocytes, UA SMALL (*)    Bacteria, UA MANY (*)    Squamous Epithelial / LPF FEW (*)    Casts HYALINE CASTS (*)    All other components within normal limits  POCT I-STAT, CHEM 8 - Abnormal; Notable for the following:    Sodium 122 (*)    Chloride 87 (*)    BUN 32 (*)    Creatinine, Ser 1.30 (*)    Glucose, Bld 111 (*)    Hemoglobin 8.8 (*)    HCT 26.0 (*)    All other components within normal limits  URINE CULTURE  CULTURE, EXPECTORATED SPUTUM-ASSESSMENT  URINE RAPID DRUG SCREEN (HOSP PERFORMED)  CBC WITH DIFFERENTIAL  BASIC METABOLIC PANEL  STREP PNEUMONIAE URINARY ANTIGEN  LEGIONELLA ANTIGEN, URINE   Dg Chest 2  View  10/28/2012  *RADIOLOGY REPORT*  Clinical Data: Shortness of breath, cough and weakness.  CHEST - 2 VIEW  Comparison: 08/10/2012.  Findings: Normal sized heart.  Interval mild patchy opacity in the right mid and lower lung zones.  Stable mild prominence of the interstitial markings and clear left lung.  Stable cervical spine fixation hardware.  The bones appear osteopenic.  IMPRESSION:  1.  Right mid and lower lung zone pneumonia. 2.  Stable mild chronic interstitial lung disease.   Original Report Authenticated By: Beckie Salts, M.D.     Date: 10/29/2012  Rate: 83  Rhythm: normal sinus rhythm  QRS Axis: normal  Intervals: normal  ST/T Wave abnormalities:normal  Conduction Disutrbances:none one PVC  Narrative Interpretation: abnormal   Old EKG Reviewed: unchanged yes   1. Aspiration pneumonia       MDM          Arman Filter, NP 10/29/12 0230

## 2012-10-29 NOTE — Evaluation (Signed)
Physical Therapy Evaluation Patient Details Name: MAITRI SCHNOEBELEN MRN: [id number] DOB: 1923-10-08 Today's Date: 10/29/2012 Time: 4098-1191 PT Time Calculation (min): 35 min  PT Assessment / Plan / Recommendation Clinical Impression  Pt admitted for SOB.  Pt would benefit from acute PT services in order to improve independence with transfers and ambulation and prepare for d/c back to ILF.  Pt denies SOB with activity today and will likely progress well however recommend 24/7 assist upon d/c as pt required assist to stand and some assist with hygiene.    PT Assessment  Patient needs continued PT services    Follow Up Recommendations  Home health PT;Supervision/Assistance - 24 hour    Does the patient have the potential to tolerate intense rehabilitation      Barriers to Discharge        Equipment Recommendations  None recommended by PT    Recommendations for Other Services     Frequency Min 3X/week    Precautions / Restrictions Precautions Precautions: Fall   Pertinent Vitals/Pain SaO2 96% on 2L      Mobility  Bed Mobility Bed Mobility: Supine to Sit Supine to Sit: 5: Supervision Details for Bed Mobility Assistance: cues for lines Transfers Transfers: Sit to Stand;Stand to Sit Sit to Stand: 4: Min assist;With upper extremity assist;From chair/3-in-1 Stand to Sit: 4: Min assist;With upper extremity assist;To chair/3-in-1 Details for Transfer Assistance: verbal cues for safe technique Ambulation/Gait Ambulation/Gait Assistance: 4: Min guard Ambulation Distance (Feet): 180 Feet Assistive device: Rolling walker Ambulation/Gait Assistance Details: verbal cues for RW, SaO2 96% on 2L upon return to chair Gait Pattern: Step-through pattern;Trunk flexed    Exercises     PT Diagnosis: Difficulty walking  PT Problem List: Decreased activity tolerance;Decreased mobility;Decreased knowledge of use of DME PT Treatment Interventions: Gait training;DME instruction;Functional  mobility training;Therapeutic exercise;Therapeutic activities;Patient/family education   PT Goals Acute Rehab PT Goals PT Goal Formulation: With patient Time For Goal Achievement: 11/05/12 Potential to Achieve Goals: Good Pt will go Sit to Stand: with modified independence PT Goal: Sit to Stand - Progress: Goal set today Pt will go Stand to Sit: with modified independence PT Goal: Stand to Sit - Progress: Goal set today Pt will Ambulate: >150 feet;with modified independence;with rolling walker PT Goal: Ambulate - Progress: Goal set today Pt will Perform Home Exercise Program: with supervision, verbal cues required/provided PT Goal: Perform Home Exercise Program - Progress: Goal set today  Visit Information  Last PT Received On: 10/29/12 Assistance Needed: +1    Subjective Data  Subjective: I need to use the bathroom   Prior Functioning  Home Living Lives With: Alone Type of Home: Independent living facility Home Adaptive Equipment: Walker - rolling Prior Function Level of Independence: Independent with assistive device(s) Communication Communication: No difficulties    Cognition  Cognition Overall Cognitive Status: Appears within functional limits for tasks assessed/performed Arousal/Alertness: Awake/alert Orientation Level: Appears intact for tasks assessed Behavior During Session: [facility] for tasks performed    Extremity/Trunk Assessment Right Upper Extremity Assessment RUE ROM/Strength/Tone: [facility] for tasks assessed Left Upper Extremity Assessment LUE ROM/Strength/Tone: [facility] for tasks assessed Right Lower Extremity Assessment RLE ROM/Strength/Tone: [facility] for tasks assessed Left Lower Extremity Assessment LLE ROM/Strength/Tone: [facility] for tasks assessed   Balance Balance Balance Assessed: Yes Dynamic Standing Balance Dynamic Standing - Balance Support: During functional activity;Left upper extremity supported Dynamic Standing - Level of Assistance: 5: Stand by  assistance Dynamic Standing - Comments: Pt standing with posterior lean against toilet and wide BOS for  performing hygiene 5 min  End of Session PT - End of Session Equipment Utilized During Treatment: Gait belt;Oxygen Activity Tolerance: Patient tolerated treatment well Patient left: in chair;with call bell/phone within reach;with nursing in room  GP     [facility] E 10/29/2012, 4:28 PM Zenovia Jarred, PT, DPT 10/29/2012 Pager: [phone]

## 2012-10-29 NOTE — Progress Notes (Signed)
CRITICAL VALUE ALERT  Critical value received:  Na 117  Date of notification:  10/29/12  Time of notification:  0755pm  Critical value read back: yes  Nurse who received alert:  Chrystie Nose  MD notified (1st page):  Merdis Delay NP  Time of first page:  0757 pm  MD notified (2nd page):  Time of second page:  Responding MD:  Merdis Delay NP  Time MD responded:  0805 No new orders

## 2012-10-29 NOTE — Progress Notes (Signed)
ANTIBIOTIC CONSULT NOTE - INITIAL  Pharmacy Consult for Zosyn/Vancomycin Indication: Aspiration PNA/HCAP  Allergies  Allergen Reactions  . Aspirin Hives    High Doses    Patient Measurements: Height: 5\' 4"  (162.6 cm) Weight: 114 lb 10.2 oz (52 kg) IBW/kg (Calculated) : 54.7   Vital Signs: Temp: 98.6 F (37 C) (03/19 0106) Temp src: Oral (03/19 0106) BP: 150/65 mmHg (03/19 0106) Pulse Rate: 100 (03/19 0106) Intake/Output from previous day:   Intake/Output from this shift:    Labs:  Recent Labs  10/28/12 2126 10/28/12 2138  WBC 14.4*  --   HGB 9.6* 8.8*  PLT 201  --   CREATININE  --  1.30*   Estimated Creatinine Clearance: 25 ml/min (by C-G formula based on Cr of 1.3). No results found for this basename: VANCOTROUGH, VANCOPEAK, VANCORANDOM, GENTTROUGH, GENTPEAK, GENTRANDOM, TOBRATROUGH, TOBRAPEAK, TOBRARND, AMIKACINPEAK, AMIKACINTROU, AMIKACIN,  in the last 72 hours   Microbiology: No results found for this or any previous visit (from the past 720 hour(s)).  Medical History: Past Medical History  Diagnosis Date  . Unspecified sinusitis (chronic)   . Unspecified essential hypertension   . Cerebrovascular disease, unspecified   . Unspecified venous (peripheral) insufficiency   . Pure hypercholesterolemia   . Unspecified disorder of thyroid   . Esophageal reflux   . Unspecified constipation   . Urinary tract infection, site not specified   . Osteoarthrosis, unspecified whether generalized or localized, unspecified site   . Degeneration of cervical intervertebral disc   . Closed fracture of other bone of wrist   . Anxiety state, unspecified   . Idiopathic urticaria   . Anemia, unspecified   . Other B-complex deficiencies   . Herpes zoster without mention of complication   . Stroke     Medications:  Scheduled:  . aspirin EC  325 mg Oral Daily  . azithromycin  1 drop Both Eyes QPM  . [COMPLETED] azithromycin  500 mg Oral Once  . [COMPLETED] cefTRIAXone  (ROCEPHIN)  IV  1 g Intravenous Once  . [START ON 10/30/2012] cholecalciferol  400 Units Oral Q7 days  . enoxaparin (LOVENOX) injection  40 mg Subcutaneous Q24H  . feeding supplement  237 mL Oral Daily  . levothyroxine  75 mcg Oral q morning - 10a  . [COMPLETED] lidocaine      . losartan  50 mg Oral Daily  . pantoprazole  20 mg Oral Daily  . pindolol  5 mg Oral BID  . piperacillin-tazobactam (ZOSYN)  IV  3.375 g Intravenous Q8H  . polyethylene glycol  17 g Oral Daily  . senna  1 tablet Oral BID  . senna-docusate  1 tablet Oral BID  . simvastatin  20 mg Oral q1800  . sodium chloride  3 mL Intravenous Q12H  . vancomycin  750 mg Intravenous Q24H  . cyanocobalamin  1,000 mcg Oral q morning - 10a  . [DISCONTINUED] azithromycin  500 mg Oral Once  . [DISCONTINUED] lidocaine  20 mL Mouth/Throat Once  . [DISCONTINUED] piperacillin-tazobactam (ZOSYN)  IV  3.375 g Intravenous Once  . [DISCONTINUED] piperacillin-tazobactam  3.375 g Intravenous Q8H  . [DISCONTINUED] polyethylene glycol powder  17 g Oral Daily   Infusions:  . sodium chloride 75 mL/hr at 10/29/12 0049   Assessment: 79 yo admitted with SOB.  Pt with recent vomiting and diarrhea and s/p CVA with history of dysphagia.  MD starting Vancomycin and Zosyn per Rx for aspiration PNA/HCAP.  Goal of Therapy:  Vancomycin trough level 15-20 mcg/ml  Plan:   Zosyn 3.375 Gm IV q8h EI infusion  Vancomycin 750mg  IV q24h.  CrCl ~35 (N)  F/U SCr/levels as needed.  Susanne Greenhouse R 10/29/2012,1:41 AM

## 2012-10-29 NOTE — Care Management (Signed)
CARE MANAGEMENT NOTE 10/29/2012  Patient:  Margaret [name], Margaret [name]   Account Number:  [id number]  Date Initiated:  10/29/2012  Documentation initiated by:  Milanni Ayub  Subjective/Objective Assessment:   79 yo female admitted with pneumonia 7 hyponatremia. PTA pt independent. Adult daughters assist in home care.     Action/Plan:   Home when stable   Anticipated DC Date:     Anticipated DC Plan:        DC Planning Services  CM consult      Choice offered to / List presented to:             Status of service:  In process, will continue to follow Medicare Important Message given?   (If response is "NO", the following Medicare IM given date fields will be blank) Date Medicare IM given:   Date Additional Medicare IM given:    Discharge Disposition:    Per UR Regulation:  Reviewed for med. necessity/level of care/duration of stay  If discussed at Long Length of Stay Meetings, dates discussed:    Comments:  10/29/12 1439 Kristopher Delk,RN,BSN [phone]

## 2012-10-29 NOTE — Progress Notes (Signed)
[facility] PROGRESS NOTE  Margaret [name] ZOX:[id number] DOB: 1923/09/12 DOA: 10/28/2012 PCP: Michele Mcalpine, MD  Assessment/Plan: SOB: after several episodes of vomiting last night. Her chest x-ray is consistent with pneumonia. Given that patient had episodes of vomiting last night this is most consistent with aspiration pneumonia.  cover Gram positives, gram negatives and mouth flora, given that patient has been living in a independent senior citizen living home with close contact to multiple older individuals.  -vanc/azithro/rocephin? -Blood cultures  -Sputum cultures  -Urine for Legionella and strep  -narrow antibiotics in next 48 hours  -Continue albuterol nebulizers every 6 hours   Hyponatremia: This is something chronic and patient has had in the past with values as low as 114. Today patient's sodium is 122. She does not have a diagnosis of SIADH. I will start her on IV fluids normal saline at 75 cc an hour and followup basic metabolic profile in the morning.   Dysphagia- has been on dys 2 diet with thicken liquids- asked speech to see- recommend MBS- ordered-- patient has had strictures in the past   Leukocytosis- improved  hypokalemia repleat Code Status: DNR Family Communication: patient at bedside Disposition Plan: PT eval (from assisted living?)   Consultants:    Procedures:    Antibiotics:    HPI/Subjective: Nurse noticed some difficulty swallowing water Patient c/o sore throat   Objective: Filed Vitals:   10/28/12 2200 10/29/12 0015 10/29/12 0106 10/29/12 0625  BP:   150/65 133/84  Pulse: 82  100 97  Temp:  99.4 F (37.4 C) 98.6 F (37 C) 98.6 F (37 C)  TempSrc:  Rectal Oral Oral  Resp: 19  18 18   Height:   5\' 4"  (1.626 m)   Weight:   52 kg (114 lb 10.2 oz)   SpO2: 98%  93% 97%    Intake/Output Summary (Last 24 hours) at 10/29/12 1302 Last data filed at 10/29/12 8119  Gross per 24 hour  Intake 598.75 ml  Output    150 ml  Net 448.75  ml   Filed Weights   10/29/12 0106  Weight: 52 kg (114 lb 10.2 oz)    Exam:   General:  A+Ox3, NAD- sounds very horse  Cardiovascular: rrr  Respiratory: clear anterior  Abdomen: +BS, soft, NT/ND  Musculoskeletal: CN 2-12 grossly intact   Data Reviewed: Basic Metabolic Panel:  Recent Labs Lab 10/28/12 2138 10/29/12 0403 10/29/12 1125  NA 122* 119* 121*  K 3.8 3.1* 3.2*  CL 87* 84* 86*  CO2  --  25 26  GLUCOSE 111* 113* 109*  BUN 32* 29* 25*  CREATININE 1.30* 0.97 0.73  CALCIUM  --  9.0 8.8   Liver Function Tests: No results found for this basename: AST, ALT, ALKPHOS, BILITOT, PROT, ALBUMIN,  in the last 168 hours No results found for this basename: LIPASE, AMYLASE,  in the last 168 hours No results found for this basename: AMMONIA,  in the last 168 hours CBC:  Recent Labs Lab 10/28/12 2126 10/28/12 2138 10/29/12 0403  WBC 14.4*  --  11.5*  NEUTROABS 11.4*  --  8.4*  HGB 9.6* 8.8* 8.8*  HCT 26.2* 26.0* 23.8*  MCV 86.5  --  85.0  PLT 201  --  181   Cardiac Enzymes: No results found for this basename: CKTOTAL, CKMB, CKMBINDEX, TROPONINI,  in the last 168 hours BNP (last 3 results)  Recent Labs  08/14/12 1134  PROBNP 1779.0*   CBG: No results found for this  basename: GLUCAP,  in the last 168 hours  No results found for this or any previous visit (from the past 240 hour(s)).   Studies: Dg Chest 2 View  10/28/2012  *RADIOLOGY REPORT*  Clinical Data: Shortness of breath, cough and weakness.  CHEST - 2 VIEW  Comparison: 08/10/2012.  Findings: Normal sized heart.  Interval mild patchy opacity in the right mid and lower lung zones.  Stable mild prominence of the interstitial markings and clear left lung.  Stable cervical spine fixation hardware.  The bones appear osteopenic.  IMPRESSION:  1.  Right mid and lower lung zone pneumonia. 2.  Stable mild chronic interstitial lung disease.   Original Report Authenticated By: Beckie Salts, M.D.     Scheduled  Meds: . aspirin EC  325 mg Oral Daily  . azithromycin  1 drop Both Eyes QPM  . [START ON 10/30/2012] cholecalciferol  400 Units Oral Q7 days  . enoxaparin (LOVENOX) injection  40 mg Subcutaneous Q24H  . feeding supplement  237 mL Oral Daily  . levothyroxine  75 mcg Oral q morning - 10a  . losartan  50 mg Oral Daily  . pantoprazole  20 mg Oral Daily  . pindolol  5 mg Oral BID  . piperacillin-tazobactam (ZOSYN)  IV  3.375 g Intravenous Q8H  . polyethylene glycol  17 g Oral Daily  . potassium chloride  10 mEq Intravenous Q1 Hr x 5  . senna  1 tablet Oral BID  . senna-docusate  1 tablet Oral BID  . simvastatin  20 mg Oral q1800  . sodium chloride  3 mL Intravenous Q12H  . vancomycin  500 mg Intravenous Q12H  . cyanocobalamin  1,000 mcg Oral q morning - 10a   Continuous Infusions: . sodium chloride 100 mL/hr at 10/29/12 0600    Principal Problem:   Aspiration pneumonia Active Problems:   HYPERCHOLESTEROLEMIA   ANEMIA   HYPERTENSION   CEREBROVASCULAR DISEASE   VENOUS INSUFFICIENCY   CHRONIC OBSTRUCTIVE ASTHMA UNSPECIFIED   DEGENERATIVE DISC DISEASE, CERVICAL SPINE   Hyponatremia   Chronic diastolic CHF (congestive heart failure)    Time spent: 35    [facility], [place]  [facility] Pager [phone]. If 7PM-7AM, please contact night-coverage at [email or web address], password [facility] 10/29/2012, 1:02 PM  LOS: 1 day

## 2012-10-30 ENCOUNTER — Inpatient Hospital Stay (HOSPITAL_COMMUNITY): Payer: Medicare Other

## 2012-10-30 LAB — BASIC METABOLIC PANEL
CO2: 24 mEq/L (ref 19–32)
CO2: 25 mEq/L (ref 19–32)
Calcium: 8.7 mg/dL (ref 8.4–10.5)
Chloride: 91 mEq/L — ABNORMAL LOW (ref 96–112)
GFR calc Af Amer: >90 mL/min (ref >90)
GFR calc Af Amer: >90 mL/min (ref >90)
GFR calc Af Amer: >90 mL/min (ref >90)
GFR calc non Af Amer: 80 mL/min — ABNORMAL LOW (ref >90)
Glucose, Bld: 146 mg/dL — ABNORMAL HIGH (ref 70–99)
Potassium: 4.2 mEq/L (ref 3.5–5.1)
Potassium: 3.5 mEq/L (ref 3.5–5.1)
Sodium: 123 mEq/L — ABNORMAL LOW (ref 135–145)
Sodium: 125 mEq/L — ABNORMAL LOW (ref 135–145)
Sodium: 124 mEq/L — ABNORMAL LOW (ref 135–145)

## 2012-10-30 MED ORDER — FLEET ENEMA 7-19 GM/118ML RE ENEM
1.0000 | ENEMA | Freq: Every day | RECTAL | Status: DC | PRN
  Administered 2012-11-01: 1 via RECTAL
  Filled 2012-10-30 (×2): qty 1

## 2012-10-30 NOTE — Progress Notes (Signed)
Speech Language Pathology Dysphagia Treatment Patient Details Name: Margaret [name] MRN: [id number] DOB: 1924-03-13 Today's Date: 10/30/2012 Time: 5621-3086 SLP Time Calculation (min): 30 min  Assessment / Plan / Recommendation Clinical Impression  Pt seen for skilled dysphagia treatment to review MBS findings, reinforce compensatory strategies and to mitigate aspiration risk.  Pt admitted to becoming "choked" today on mashed potatoes but she attributes this to her family visiting and distracting her.  Pt also misunderstood compensation strategy, thinking she needed to take 6 sips of liquids to clear pharynx of solids, advised pt to take a SINGLE sip and swallow 4-5 times with EACH sip.  SLP reviewed pt's lack of sensation to gross stasis in pharynx and importance of using strategies to decrease residuals.    Observed pt to consume water with immediate cough after, to which she attributes to her "cold".  Reeducated her to cough being due to dysphagia/aspiration from pharyngeal stasis entering laryngeal vestibule/airway.  Pt was able to verbalze her precautions to slp by end of visit and SLP provided them in writing for reinforcement.   Please note, pt admits to "leaking" of saliva from right side of mouth for several months- new this year!   Hoarseness x a few weeks also reported - ? aspiration related.  SLP will sign off as pt has been completely educated to results of MBS, chronic aspiration risk and increased pulmonary risk with multilple med issues & deconditiniong, and dysphagia compensations/precautions.  Pt has been dealing with this dysphagia "for years" and has been managing as best able.  Pt expressed gratitude for the information provided and SLP left phone number for pt/family use if needed.  Please re-consult if desire.  Thanks.       Diet Recommendation  Continue with Current Diet: Dysphagia 3 (mechanical soft);Thin liquid (ground meats, sauce/gravy extra)    SLP Plan All goals met    Pertinent Vitals/Pain Afebrile, decreased   Swallowing Goals  SLP Swallowing Goals Swallow Study Goal #2 - Progress: Met  General Temperature Spikes Noted: No Respiratory Status: Supplemental O2 delivered via (comment) Behavior/Cognition: Alert;Cooperative;Hard of hearing Oral Cavity - Dentition: Adequate natural dentition   Dysphagia Treatment Treatment focused on: Skilled observation of diet tolerance;Patient/family/caregiver education Family/Caregiver Educated: pt Patient observed directly with PO's: Yes Type of PO's observed: Thin liquids Reason PO's not observed:  (fluid restriction, only saw pt with small single sip) Feeding: Able to feed self Liquids provided via: Cup Pharyngeal Phase Signs & Symptoms: Suspected delayed swallow initiation;Multiple swallows;Immediate cough Type of cueing: Verbal Amount of cueing: Minimal   GO     Donavan Burnet, MS [facility] SLP [phone]

## 2012-10-30 NOTE — Progress Notes (Signed)
Clinical Social Work Department BRIEF PSYCHOSOCIAL ASSESSMENT 10/30/2012  Patient:  Margaret [name], Margaret [name]     Account Number:  [id number]     Admit date:  10/28/2012  Clinical Social Worker:  Jacelyn Grip  Date/Time:  10/30/2012 11:00 AM  Referred by:  Physician  Date Referred:  10/30/2012 Referred for  SNF Placement   Other Referral:   Interview type:  Family Other interview type:    PSYCHOSOCIAL DATA Living Status:  FACILITY Admitted from facility:   Level of care:  Independent Living Primary support name:  Malachi Bonds Johnson/daughter Primary support relationship to patient:  CHILD, ADULT Degree of support available:   adequate    CURRENT CONCERNS Current Concerns  Post-Acute Placement   Other Concerns:    SOCIAL WORK ASSESSMENT / PLAN CSW received notification from MD that PT recommendation is for [facility] PT if pt has 24 hour supervision at d/c and per pt daughter, pt family unable to provide 24 hour supervision.    CSW atttempted to meet with pt at bedside, however pt was being tended to for personal care by nursing staff.    CSW contacted pt daughter via telephone. Pt daughter agreeable to SNF search in [place]. Pt daughter preference is [facility], but is unsure if facility will accept pt because pt has been there in the past and has a balance due to facility. Pt daughter agreeable to [facility] search.    CSW completed FL2 and initiated SNF search to [facility].    CSW to follow up with pt and pt daughter re: bed offers.    CSW to continue to follow and facilitate pt discharge needs when pt medically stable for discharge.   Assessment/plan status:  Psychosocial Support/Ongoing Assessment of Needs Other assessment/ plan:   discharge planning   Information/referral to community resources:   [facility] list    PATIENT'S/FAMILY'S RESPONSE TO PLAN OF CARE: Pt was currently being tended to by RN and nurse tech for  personal care and unable to participate in assessment at this time. Pt daughter supportive and actively involved in care and feels rehab would be beneficial as pt family unable to provide 24 hour care at this time.      Jacklynn Lewis, MSW, LCSWA  Clinical Social Work [phone]

## 2012-10-30 NOTE — Progress Notes (Signed)
[facility] PROGRESS NOTE  Margaret [name] RUE:[id number] DOB: 1924-05-05 DOA: 10/28/2012 PCP: Michele Mcalpine, MD  Assessment/Plan: SOB: after several episodes of vomiting last night. Her chest x-ray is consistent with pneumonia. Given that patient had episodes of vomiting last night this is most consistent with aspiration pneumonia.  cover Gram positives, gram negatives and mouth flora, given that patient has been living in a independent senior citizen living home with close contact to multiple older individuals.  -vanc/azithro/rocephin? -Blood cultures  -Sputum cultures  -Urine for Legionella and strep- negative -narrow antibiotics tomm  -Continue albuterol nebulizers every 6 hours   Hyponatremia: This is something chronic and patient has had in the past with values as low as 114. Improved, appears to be component of dehydration and SIADH.  IV fluids normal saline at 75 cc an hour and followup basic metabolic profile in the morning.   Dysphagia- has been on dys 2 diet with thicken liquids- MBS showed Severe cervical esophageal phase dysphagia; Moderate pharyngeal phase dysphagia;Mild oral phase dysphagia   Leukocytosis- improved  hypokalemia repleat  Severe malnutrition in the context of chronic illness  Code Status: DNR Family Communication: patient at bedside Disposition Plan: PT eval (from assisted living?)- family not able to provide 24 hour care   Consultants:    Procedures:    Antibiotics:    HPI/Subjective: Patient c/o loose stools No fever, no chills   Objective: Filed Vitals:   10/29/12 0625 10/29/12 1313 10/29/12 2200 10/30/12 0524  BP: 133/84 157/67 155/62 132/56  Pulse: 97 75 82 80  Temp: 98.6 F (37 C) 99 F (37.2 C) 97.8 F (36.6 C) 98.6 F (37 C)  TempSrc: Oral Oral Oral Oral  Resp: 18 16 18 16   Height:      Weight:      SpO2: 97% 100% 100% 95%    Intake/Output Summary (Last 24 hours) at 10/30/12 1005 Last data filed at 10/30/12  0526  Gross per 24 hour  Intake    270 ml  Output   1200 ml  Net   -930 ml   Filed Weights   10/29/12 0106  Weight: 52 kg (114 lb 10.2 oz)    Exam:   General:  A+Ox3, NAD- sounds very horse  Cardiovascular: rrr  Respiratory: clear anterior  Abdomen: +BS, soft, NT/ND  Musculoskeletal: CN 2-12 grossly intact   Data Reviewed: Basic Metabolic Panel:  Recent Labs Lab 10/28/12 2138 10/29/12 0403 10/29/12 1125 10/29/12 1852 10/30/12 0240  NA 122* 119* 121* 117* 123*  K 3.8 3.1* 3.2* 4.1 3.5  CL 87* 84* 86* 83* 89*  CO2  --  25 26 25 24   GLUCOSE 111* 113* 109* 92 88  BUN 32* 29* 25* 21 15  CREATININE 1.30* 0.97 0.73 0.65 0.64  CALCIUM  --  9.0 8.8 8.6 8.7   Liver Function Tests: No results found for this basename: AST, ALT, ALKPHOS, BILITOT, PROT, ALBUMIN,  in the last 168 hours No results found for this basename: LIPASE, AMYLASE,  in the last 168 hours No results found for this basename: AMMONIA,  in the last 168 hours CBC:  Recent Labs Lab 10/28/12 2126 10/28/12 2138 10/29/12 0403  WBC 14.4*  --  11.5*  NEUTROABS 11.4*  --  8.4*  HGB 9.6* 8.8* 8.8*  HCT 26.2* 26.0* 23.8*  MCV 86.5  --  85.0  PLT 201  --  181   Cardiac Enzymes: No results found for this basename: CKTOTAL, CKMB, CKMBINDEX, TROPONINI,  in the  last 168 hours BNP (last 3 results)  Recent Labs  08/14/12 1134  PROBNP 1779.0*   CBG: No results found for this basename: GLUCAP,  in the last 168 hours  Recent Results (from the past 240 hour(s))  URINE CULTURE     Status: None   Collection Time    10/28/12  9:25 PM      Result Value Range Status   Specimen Description URINE, CATHETERIZED   Final   Special Requests A   Final   Culture  Setup Time 10/29/2012 02:43   Final   Colony Count PENDING   Incomplete   Culture Culture reincubated for better growth   Final   Report Status PENDING   Incomplete     Studies: Dg Chest 2 View  10/28/2012  *RADIOLOGY REPORT*  Clinical Data:  Shortness of breath, cough and weakness.  CHEST - 2 VIEW  Comparison: 08/10/2012.  Findings: Normal sized heart.  Interval mild patchy opacity in the right mid and lower lung zones.  Stable mild prominence of the interstitial markings and clear left lung.  Stable cervical spine fixation hardware.  The bones appear osteopenic.  IMPRESSION:  1.  Right mid and lower lung zone pneumonia. 2.  Stable mild chronic interstitial lung disease.   Original Report Authenticated By: Beckie Salts, M.D.    Dg Swallowing Func-speech Pathology  10/29/2012  Chales Abrahams, CCC-SLP     10/29/2012  2:48 PM Objective Swallowing Evaluation: Modified Barium Swallowing Study   Patient Details  Name: Margaret [name] MRN: [id number] Date of Birth: 14-Apr-1924  Today's Date: 10/29/2012 Time: 1210-1246 SLP Time Calculation (min): 36 min  Past Medical History:  Past Medical History  Diagnosis Date  . Unspecified sinusitis (chronic)   . Unspecified essential hypertension   . Cerebrovascular disease, unspecified   . Unspecified venous (peripheral) insufficiency   . Pure hypercholesterolemia   . Unspecified disorder of thyroid   . Esophageal reflux   . Unspecified constipation   . Urinary tract infection, site not specified   . Osteoarthrosis, unspecified whether generalized or localized,  unspecified site   . Degeneration of cervical intervertebral disc   . Closed fracture of other bone of wrist   . Anxiety state, unspecified   . Idiopathic urticaria   . Anemia, unspecified   . Other B-complex deficiencies   . Herpes zoster without mention of complication   . Stroke    Past Surgical History:  Past Surgical History  Procedure Laterality Date  . Tonsillectomy    . Total abdominal hysterectomy w/ bilateral salpingoophorectomy     . Cervical fusion    . Cataract extraction    . Nasal sinus surgery    . Esophagogastroduodenoscopy  09/10/2011    Procedure: ESOPHAGOGASTRODUODENOSCOPY (EGD);  Surgeon: Petra Kuba, MD;  Location: [facility] ENDOSCOPY;  Service:  Endoscopy;   Laterality: N/A;  c-arm needed  . Savory dilation  09/10/2011    Procedure: SAVORY DILATION;  Surgeon: Petra Kuba, MD;   Location: [facility] ENDOSCOPY;  Service: Endoscopy;  Laterality: N/A;  . Esophagogastroduodenoscopy  05/13/2012    Procedure: ESOPHAGOGASTRODUODENOSCOPY (EGD);  Surgeon: Petra Kuba, MD;  Location: [facility] ENDOSCOPY;  Service: Endoscopy;   Laterality: N/A;  with carm  . Savory dilation  05/13/2012    Procedure: SAVORY DILATION;  Surgeon: Petra Kuba, MD;   Location: [facility] ENDOSCOPY;  Service: Endoscopy;  Laterality: N/A;   HPI:  79 yo female adm to [facility] with asp pna- episode of  vomiting of food  particles and loose bowels.   Pt has a complex med hx including  HTN, recent CVA, arthritis, anemia, thyroid d/o, anxiety, and  reflux.  Pt PSH + for multiple dilatations of esophagus - last  being 05/13/2012.  DG esophagus 04/25/2012 moderate to severe delay  in contrast- decreased motility - took 20 minutes for esophagus  to clear.  CXR 10/28/12 showed right mid and lower lobe pna.  Pt  admits to dysphagia for years for which she compensates by taking  small bites/sips, chewing well, etc.  She denies dysphagia  worsening.  She also admits to sore throat and several "colds" in  the last few years.          Assessment / Plan / Recommendation Clinical Impression  Dysphagia Diagnosis: Severe cervical esophageal phase  dysphagia;Moderate pharyngeal phase dysphagia;Mild oral phase  dysphagia   Clinical impression: Pt presents with mild oral, moderate  pharyngeal and severe cervical esophageal dysphagia resulting in  gross pharyngeal stasis WITHOUT pt awareness/sensation.  Suspect  her dysphagia is multifactorial - due to known h/o CVA impacting  oropharyngeal swallow and known h/o significant esophageal  deficit.  Although pt did not overtly aspirate, she is at very  high risk due to severity of stasis and poor ability to clear-  even with dry swallows.  Pt required 5-6 swallows to decreased  pyriform/cp residual  of small bolus of thin liquid to mild amount  - mixing with secretions.  UES opens only minimally and suspected  decr laryngeal elevation/closure contributes to this issue as  well as ACDF hardware at C6-C7 impinging mildly.  Initial  swallows of pudding and cracker resulted in no movement of  consistencies into esophagus from pharynx- - nearly absent  muscular contraction, UES opening noted.  100% of pudding/cracker  bolus lodged in pharynx at vallecular/pyriform/cp space without  pt sensation.  Swallows of thin aided clearance of solids but  resulted in stasis of liquids again with sensory deficits.   Esophageal clearance also appeared compromised, pt has known h/o  motility issues.    Suspect pt has been aspirating chronically with tolerance until  this medical event.  Unfortuantely due to pharyngeal sensorimotor  deficits and chronic nature of dysphagia,  prognosis for safe  efficient swallow is poor.   Aspiration risk will be ongoing and  exacerbated with acute medical illnesses with decreased tolerance  due to advanced age and weakness.    Suspect pt's dysphagia may contribute to her weight loss.   If MD  desires to pt to continue po with known risk, thinner liquids  will be best tolerated if aspirated and will decrease amount of  pharyngeal stasis.  Soft foods that pt can masticate very  thoroughly may provide pt with QOL.    SLP educated pt to findings, recommendations during testing but  pt will benefit from reinforcement of information.  SLP to follow  for education of pt, family to mitigation of  aspiration/dysphagia.  Thanks for this referral.        Treatment Recommendation  Therapy as outlined in treatment plan below    Diet Recommendation NPO;Dysphagia 3 (Mechanical Soft);Thin liquid  (npo or dys3/GROUND MEATS/thin with risks)   Liquid Administration via: Cup;Straw Medication Administration: Crushed with puree Supervision: Patient able to self feed Compensations: Slow rate;Small sips/bites;Follow  solids with  liquid;Multiple dry swallows after each bite/sip Postural Changes and/or Swallow Maneuvers: Seated upright 90  degrees;Upright 30-60 min after meal    Other  Recommendations  Recommended Consults: MBS Oral Care Recommendations: Oral care QID   Follow Up Recommendations  None    Frequency and Duration min 2x/week  2 weeks   Pertinent Vitals/Pain Congested cough, afebrile    SLP Swallow Goals Patient will utilize recommended strategies during swallow to  increase swallowing safety with: Moderate assistance   General HPI: 79 yo female adm to [facility] with asp pna- episode of  vomiting of food particles and loose bowels.   Pt has a complex  med hx including HTN, recent CVA, arthritis, anemia, thyroid d/o,  anxiety, and reflux.  Pt PSH + for multiple dilatations of  esophagus - last being 05/13/2012.  DG esophagus 04/25/2012  moderate to severe delay in contrast- decreased motility - took  20 minutes for esophagus to clear.  CXR 10/28/12 showed right mid  and lower lobe pna.  Pt admits to dysphagia for years for which  she compensates by taking small bites/sips, chewing well, etc.   She denies dysphagia worsening.  She also admits to sore throat  and several "colds" in the last few years.      Type of Study: Modified Barium Swallowing Study Reason for Referral: Objectively evaluate swallowing function Diet Prior to this Study: Regular;Honey-thick liquids Temperature Spikes Noted: No Respiratory Status: Supplemental O2 delivered via (comment) History of Recent Intubation: No Behavior/Cognition: Alert;Cooperative;Hard of hearing;Pleasant  mood Oral Cavity - Dentition: Adequate natural dentition Oral Motor / Sensory Function: Impaired - see Bedside swallow  eval Self-Feeding Abilities: Able to feed self Patient Positioning: Upright in chair Baseline Vocal Quality: Hoarse;Low vocal intensity Volitional Cough: Weak;Congested Volitional Swallow: Able to elicit Anatomy: Within functional limits (cervical hardware noted at   C6-C7) Pharyngeal Secretions: Not observed secondary MBS    Reason for Referral Objectively evaluate swallowing function   Oral Phase Oral Preparation/Oral Phase Oral Phase: WFL Oral - Nectar Oral - Nectar Cup: Reduced posterior propulsion (decreased tongue  baser retraction) Oral - Thin Oral - Thin Teaspoon: Reduced posterior propulsion Oral - Thin Cup: Reduced posterior propulsion Oral - Thin Straw: Reduced posterior propulsion Oral - Solids Oral - Puree: Reduced posterior propulsion Oral - Regular: Reduced posterior propulsion Oral Phase - Comment Oral Phase - Comment: decreased tongue base retraction noted,  suspect decr intraoral pressure to help propel barium through  esophagus   Pharyngeal Phase Pharyngeal Phase Pharyngeal Phase: Impaired Pharyngeal - Nectar Pharyngeal - Nectar Cup: Pharyngeal residue - pyriform  sinuses;Reduced anterior laryngeal mobility;Reduced epiglottic  inversion;Reduced pharyngeal peristalsis;Reduced laryngeal  elevation;Reduced airway/laryngeal closure;Reduced tongue base  retraction;Pharyngeal residue - cp segment Pharyngeal - Thin Pharyngeal - Thin Teaspoon: Reduced pharyngeal  peristalsis;Reduced epiglottic inversion;Reduced anterior  laryngeal mobility;Reduced laryngeal elevation;Reduced  airway/laryngeal closure;Reduced tongue base  retraction;Pharyngeal residue - cp segment;Pharyngeal residue -  pyriform sinuses Pharyngeal - Thin Cup: Penetration/Aspiration during  swallow;Reduced pharyngeal peristalsis;Reduced epiglottic  inversion;Reduced anterior laryngeal mobility;Reduced  airway/laryngeal closure;Reduced tongue base  retraction;Pharyngeal residue - cp segment;Pharyngeal residue -  pyriform sinuses Penetration/Aspiration details (thin cup): Material enters  airway, remains ABOVE vocal cords and not ejected out Pharyngeal - Thin Straw: Reduced airway/laryngeal closure;Reduced  laryngeal elevation;Reduced pharyngeal peristalsis;Reduced  epiglottic inversion;Reduced anterior  laryngeal mobility;Reduced  tongue base retraction;Pharyngeal residue - cp segment;Pharyngeal  residue - pyriform sinuses Pharyngeal - Solids Pharyngeal - Puree: Pharyngeal residue - pyriform  sinuses;Pharyngeal residue - cp segment;Reduced laryngeal  elevation;Reduced airway/laryngeal closure;Reduced tongue base  retraction;Penetration/Aspiration before swallow Pharyngeal - Regular: Reduced tongue base retraction;Reduced  airway/laryngeal closure;Reduced laryngeal elevation;Pharyngeal  residue - cp segment;Pharyngeal residue - pyriform  sinuses Pharyngeal Phase - Comment Pharyngeal Comment: pt with essentially absent swallow/muscular  contraction at times - multifactorial, gross pyriform/cp stasis  across consistencies with very poor UES opening - dry swallows  aided clearance but pt had to swallow 4-5 times to clear small  single cup boluses, head turn right or left and chin tuck not  helpful to decr stasis  Cervical Esophageal Phase    GO    Cervical Esophageal Phase Cervical Esophageal Phase: Impaired Cervical Esophageal Phase - Nectar Nectar Cup: Reduced cricopharyngeal relaxation Cervical Esophageal Phase - Thin Thin Teaspoon: Reduced cricopharyngeal relaxation Thin Cup: Reduced cricopharyngeal relaxation Thin Straw: Reduced cricopharyngeal relaxation Cervical Esophageal Phase - Solids Puree: Reduced cricopharyngeal relaxation Regular: Reduced cricopharyngeal relaxation Cervical Esophageal Phase - Comment Cervical Esophageal Comment: Very poor UES opening, with barium  filling pyriform sinus - it appears barium leaks into esophagus-  UES noted to open with pt belch better than during swallow, ?  appearance of CP bar          Donavan Burnet, MS [facility] SLP [phone]      Scheduled Meds: . aspirin EC  325 mg Oral Daily  . azithromycin  1 drop Both Eyes QPM  . cholecalciferol  400 Units Oral Q7 days  . enoxaparin (LOVENOX) injection  40 mg Subcutaneous Q24H  . feeding supplement  237 mL Oral Daily  .  levothyroxine  75 mcg Oral q morning - 10a  . losartan  50 mg Oral Daily  . pantoprazole  20 mg Oral Daily  . pindolol  5 mg Oral BID  . piperacillin-tazobactam (ZOSYN)  IV  3.375 g Intravenous Q8H  . polyethylene glycol  17 g Oral Daily  . senna  1 tablet Oral BID  . senna-docusate  1 tablet Oral BID  . simvastatin  20 mg Oral q1800  . sodium chloride  3 mL Intravenous Q12H  . vancomycin  500 mg Intravenous Q12H  . cyanocobalamin  1,000 mcg Oral q morning - 10a   Continuous Infusions: . sodium chloride 75 mL/hr at 10/30/12 0440    Principal Problem:   Aspiration pneumonia Active Problems:   HYPERCHOLESTEROLEMIA   ANEMIA   HYPERTENSION   CEREBROVASCULAR DISEASE   VENOUS INSUFFICIENCY   CHRONIC OBSTRUCTIVE ASTHMA UNSPECIFIED   DEGENERATIVE DISC DISEASE, CERVICAL SPINE   Hyponatremia   Chronic diastolic CHF (congestive heart failure)    Time spent: 25    Marlin Canary  [facility] Pager [phone]. If 7PM-7AM, please contact night-coverage at [email or web address], password [facility] 10/30/2012, 10:05 AM  LOS: 2 days

## 2012-10-30 NOTE — Progress Notes (Signed)
Pt complained of rt sided chest pain, not other complaints. Pt not short of breath, without s/s of distress. On-call notified, new orders received.

## 2012-10-30 NOTE — Progress Notes (Signed)
Physical Therapy Treatment Patient Details Name: Margaret [name] MRN: [id number] DOB: 1923-09-27 Today's Date: 10/30/2012 Time: 5621-3086 PT Time Calculation (min): 33 min  PT Assessment / Plan / Recommendation Comments on Treatment Session  Pt c/o max constipation and rectal discomfot.  Amb pt in hallway then assited BR.  Pt on 2lts Os sats avg 92%.    Follow Up Recommendations  Home health PT;Supervision/Assistance - 24 hour     Does the patient have the potential to tolerate intense rehabilitation     Barriers to Discharge        Equipment Recommendations  None recommended by PT    Recommendations for Other Services    Frequency Min 3X/week   Plan      Precautions / Restrictions Precautions Precautions: Fall Precaution Comments: oxygen  Restrictions Weight Bearing Restrictions: No   Pertinent Vitals/Pain C/o constipation    Mobility  Bed Mobility Bed Mobility: Not assessed Details for Bed Mobility Assistance: Pt OOB in recliner Transfers Transfers: Sit to Stand;Stand to Sit Sit to Stand: 4: Min assist;With upper extremity assist;From chair/3-in-1;From toilet Stand to Sit: 4: Min assist;With upper extremity assist;To chair/3-in-1;To toilet Details for Transfer Assistance: 25% VC's on proper tech and hand placement plus turn completion with RW prior to sit. Ambulation/Gait Ambulation/Gait Assistance: 4: Min guard;4: Min Environmental consultant (Feet): 175 Feet Assistive device: Rolling walker Ambulation/Gait Assistance Details: 25% VC's on proper walker to self distance and upright posture. Gait Pattern: Step-through pattern;Trunk flexed     PT Goals                                                     progressing    Visit Information  Last PT Received On: 10/30/12 Assistance Needed: +1    Subjective Data  Subjective: I need to use the bathroom   Cognition    good   Balance   fair  End of Session PT - End of Session Equipment Utilized During  Treatment: Gait belt;Oxygen Activity Tolerance: Patient tolerated treatment well Patient left: in chair;with call bell/phone within reach;with nursing in room   Felecia Shelling  PTA [facility]  Acute  Rehab Pager      [phone]

## 2012-10-30 NOTE — Care Management (Addendum)
Cm spoke with patient with adult daughter at bedside concerning discharge planning. Pt to discharge to residential facility with assistance from [facility] agency. Per daughter choice [facility] to provide services upon discharge. [employer] rep Norvel Richards contacted concerning referral at [phone]. Md orders, facesheet, H& P faxed to [place] at [phone] No other needs stated.   Leonie Green [phone]

## 2012-10-30 NOTE — Progress Notes (Signed)
Clinical Social Work Department CLINICAL SOCIAL WORK PLACEMENT NOTE 10/30/2012  Patient:  Margaret [name], Margaret [name]  Account Number:  [id number] Admit date:  10/28/2012  Clinical Social Worker:  Jacelyn Grip  Date/time:  10/30/2012 11:30 AM  Clinical Social Work is seeking post-discharge placement for this patient at the following level of care:   SKILLED NURSING   (*CSW will update this form in Epic as items are completed)   10/30/2012  Patient/family provided with [facility] Department of Clinical Social Work's list of facilities offering this level of care within the geographic area requested by the patient (or if unable, by the patient's family).  10/30/2012  Patient/family informed of their freedom to choose among providers that offer the needed level of care, that participate in Medicare, Medicaid or managed care program needed by the patient, have an available bed and are willing to accept the patient.  10/30/2012  Patient/family informed of [facility]' ownership interest in [facility], as well as of the fact that they are under no obligation to receive care at this facility.  PASARR submitted to EDS on 10/30/2012 PASARR number received from [employer] on 10/30/2012  FL2 transmitted to all facilities in geographic area requested by pt/family on  10/30/2012 FL2 transmitted to all facilities within larger geographic area on   Patient informed that his/her managed care company has contracts with or will negotiate with  certain facilities, including the following:     Patient/family informed of bed offers received:   Patient chooses bed at  Physician recommends and patient chooses bed at    Patient to be transferred to  on   Patient to be transferred to facility by   The following physician request were entered in Epic:   Additional Comments:  Jacklynn Lewis, MSW, LCSWA  Clinical Social Work [phone]

## 2012-10-31 DIAGNOSIS — E039 Hypothyroidism, unspecified: Secondary | ICD-10-CM

## 2012-10-31 DIAGNOSIS — E871 Hypo-osmolality and hyponatremia: Secondary | ICD-10-CM

## 2012-10-31 LAB — BASIC METABOLIC PANEL
CO2: 25 mEq/L (ref 19–32)
Glucose, Bld: 100 mg/dL — ABNORMAL HIGH (ref 70–99)
Potassium: 3.4 mEq/L — ABNORMAL LOW (ref 3.5–5.1)
Sodium: 126 mEq/L — ABNORMAL LOW (ref 135–145)

## 2012-10-31 LAB — VANCOMYCIN, TROUGH: Vancomycin Tr: 9.9 ug/mL — ABNORMAL LOW (ref 10.0–20.0)

## 2012-10-31 MED ORDER — PHENOL 1.4 % MT LIQD
1.0000 | OROMUCOSAL | Status: DC | PRN
  Administered 2012-10-31 – 2012-11-01 (×2): 1 via OROMUCOSAL
  Filled 2012-10-31: qty 177

## 2012-10-31 MED ORDER — VANCOMYCIN HCL 1000 MG IV SOLR
750.0000 mg | Freq: Two times a day (BID) | INTRAVENOUS | Status: DC
  Administered 2012-10-31 – 2012-11-01 (×3): 750 mg via INTRAVENOUS
  Filled 2012-10-31 (×3): qty 750

## 2012-10-31 MED ORDER — POTASSIUM CHLORIDE 10 MEQ/100ML IV SOLN
10.0000 meq | INTRAVENOUS | Status: AC
  Administered 2012-10-31 (×3): 10 meq via INTRAVENOUS
  Filled 2012-10-31 (×3): qty 100

## 2012-10-31 NOTE — Progress Notes (Signed)
Reported off to [facility] about pt. Reviewed pt's dysphagia issues and future medications with her.

## 2012-10-31 NOTE — Progress Notes (Signed)
ANTIBIOTIC CONSULT NOTE - FOLLOW UP  Pharmacy Consult for vancomycin Indication: pneumonia  Allergies  Allergen Reactions  . Aspirin Hives    High Doses    Patient Measurements: Height: 5\' 4"  (162.6 cm) Weight: 114 lb 10.2 oz (52 kg) IBW/kg (Calculated) : 54.7 Adjusted Body Weight:   Vital Signs: Temp: 98.5 F (36.9 C) (03/20 2128) Temp src: Oral (03/20 2128) BP: 161/83 mmHg (03/20 2128) Pulse Rate: 87 (03/20 2128) Intake/Output from previous day: 03/20 0701 - 03/21 0700 In: 960 [P.O.:960] Out: 1250 [Urine:1250] Intake/Output from this shift: Total I/O In: 120 [P.O.:120] Out: 1000 [Urine:1000]  Labs:  Recent Labs  10/28/12 2126  10/28/12 2138 10/29/12 0403  10/30/12 0240 10/30/12 1440 10/30/12 2217  WBC 14.4*  --   --  11.5*  --   --   --   --   HGB 9.6*  --  8.8* 8.8*  --   --   --   --   PLT 201  --   --  181  --   --   --   --   CREATININE  --   < > 1.30* 0.97  < > 0.64 0.59 0.52  < > = values in this interval not displayed. Estimated Creatinine Clearance: 40.7 ml/min (by C-G formula based on Cr of 0.52).  Recent Labs  10/31/12 0400  VANCOTROUGH 9.9*     Microbiology: Recent Results (from the past 720 hour(s))  URINE CULTURE     Status: None   Collection Time    10/28/12  9:25 PM      Result Value Range Status   Specimen Description URINE, CATHETERIZED   Final   Special Requests A   Final   Culture  Setup Time 10/29/2012 02:43  CNSU   Final   Colony Count >=100,000 COLONIES/ML   Final   Culture [facility]   Final   Report Status PENDING   Incomplete    Anti-infectives   Start     Dose/Rate Route Frequency Ordered Stop   10/31/12 0600  vancomycin (VANCOCIN) 750 mg in sodium chloride 0.9 % 150 mL IVPB     750 mg 150 mL/hr over 60 Minutes Intravenous Every 12 hours 10/31/12 0512     10/29/12 1800  vancomycin (VANCOCIN) 500 mg in sodium chloride 0.9 % 100 mL IVPB  Status:  Discontinued     500 mg 100 mL/hr over 60 Minutes Intravenous Every 12  hours 10/29/12 1232 10/31/12 0512   10/29/12 0800  piperacillin-tazobactam (ZOSYN) IVPB 3.375 g     3.375 g 12.5 mL/hr over 240 Minutes Intravenous Every 8 hours 10/29/12 0140     10/29/12 0200  vancomycin (VANCOCIN) 750 mg in sodium chloride 0.9 % 150 mL IVPB  Status:  Discontinued     750 mg 150 mL/hr over 60 Minutes Intravenous Every 24 hours 10/29/12 0118 10/29/12 1232   10/28/12 2215  piperacillin-tazobactam (ZOSYN) IVPB 3.375 g  Status:  Discontinued     3.375 g 12.5 mL/hr over 240 Minutes Intravenous  Once 10/28/12 2205 10/28/12 2210   10/28/12 2215  piperacillin-tazobactam (ZOSYN) IVPB 3.375 g  Status:  Discontinued     3.375 g 100 mL/hr over 30 Minutes Intravenous 3 times per day 10/28/12 2210 10/29/12 0048   10/28/12 2200  azithromycin (ZITHROMAX) 200 MG/5ML suspension 500 mg    Comments:  Thicken   500 mg Oral  Once 10/28/12 2146 10/28/12 2215   10/28/12 2145  azithromycin (ZITHROMAX) tablet 500 mg  Status:  Discontinued     500 mg Oral  Once 10/28/12 2133 10/28/12 2146   10/28/12 2145  cefTRIAXone (ROCEPHIN) 1 g in dextrose 5 % 50 mL IVPB     1 g 100 mL/hr over 30 Minutes Intravenous  Once 10/28/12 2133 10/28/12 2255      Assessment: Patient with low level that was drawn early.  So true level would be even lower.    Goal of Therapy:  Vancomycin trough level 15-20 mcg/ml  Plan:  Measure antibiotic drug levels at steady state Follow up culture results Change to vancomycin 750mg  iv q12hr  Aleene Davidson Crowford 10/31/2012,5:14 AM

## 2012-10-31 NOTE — Progress Notes (Signed)
CSW met with pt at bedside to discuss SNF placement and provide bed offers.  Pt is hopeful for [facility] and CSW explained that per facility, pt has balance that must be paid prior to readmission to [facility].   Pt is concerned about her throat and her difficulty swallowing and is unsure what the medical plan is to treat these difficulties. CSW provided emotional support to pt.   CSW contacted pt daughter via telephone with pt permission. CSW discussed SNF bed offers with pt daughter and notified pt daughter that she could contact [facility] with questions re: outstanding balance. Pt daughter reports that [facility] would be a second choice if facility is able to offer a bed- [facility] currently considering.   Pt daughter also continues to discuss potential of pt returning to independent living with home health and additional care, but is unsure that 24 hour care could be provided.   Pt daughter inquired with CSW if MD had consulted palliative medicine team as per pt daughter, MD had mentioned consulting palliative medicine in a previous discussion. CSW discussed that there is no current order for palliative medicine consult and explored with pt daughter if this is something pt and pt daughter would be interested in. Pt daughter expressed interest in Palliative Care consult especially surrounding pt severe dysphagia as per pt daughter, pt has verbalized in the past that she would not want a feeding tube. CSW notified MD of pt daughter's questions surrounding palliative care consult and MD planned to consult palliative care.  CSW to continue to follow to assist with disposition planning needs.   Jacklynn Lewis, MSW, LCSWA  Clinical Social Work [phone]

## 2012-10-31 NOTE — Progress Notes (Signed)
Pt aspirated after she received 1000 meds. Suctioned pt's mouth and listened to lungs (clear). Pt's O2 level dropped to 86% and then came back up to stable range. Reported this off to Dr. Benjamine Mola. We discussed need for pt's medication route to be changed. Will continue to monitor pt and look out for new medication orders.

## 2012-10-31 NOTE — Progress Notes (Addendum)
Thank you for consulting the Palliative Medicine Team at [facility] to meet your patient's and family's needs.   The reason that you asked [place] to see your patient is  For GOC, and related symptom recommendations  We have scheduled your patient for a meeting:  11/01/12 at 4 pm  The Surrogate decision make is: Patient and Daughter  Charlestine Night  Contact information:[phone]  Other family members that need to be present:  Daughter to decided with mother    Your patient is able/unable to participate: able  Additional Narrative:  Cervical dysphagia with aspiration.

## 2012-10-31 NOTE — Progress Notes (Signed)
[facility] PROGRESS NOTE  Margaret [name]:[id number] DOB: 1924/08/11 DOA: 10/28/2012 PCP: Michele Mcalpine, MD  Assessment/Plan: SOB: after several episodes of vomiting last night. Her chest x-ray is consistent with pneumonia. Given that patient had episodes of vomiting last night this is most consistent with aspiration pneumonia.  cover Gram positives, gram negatives and mouth flora, given that patient has been living in a independent senior citizen living home with close contact to multiple older individuals.  -vanc/azithro/rocephin? -Blood cultures  -Sputum cultures  -Urine for Legionella and strep- negative -narrow antibiotics soon -Continue albuterol nebulizers every 6 hours   Hyponatremia: This is something chronic and patient has had in the past with values as low as 114. Improved, appears to be component of dehydration and SIADH.  IV fluids normal saline at 75 cc an hour and followup basic metabolic profile .   Dysphagia- has been on dys 2 diet with thicken liquids- MBS showed Severe cervical esophageal phase dysphagia; Moderate pharyngeal phase dysphagia;Mild oral phase dysphagia- discussed poor prognosis with family.   Leukocytosis- improved  hypokalemia repleat  Severe malnutrition in the context of chronic illness  Code Status: DNR Family Communication: patient at bedside Disposition Plan: PT eval (from assisted living?)- family not able to provide 24 hour care   Consultants:    Procedures:    Antibiotics:    HPI/Subjective: Had trouble swallowing pills this AM No CP, no SOB   Objective: Filed Vitals:   10/30/12 0524 10/30/12 1452 10/30/12 2128 10/31/12 0543  BP: 132/56 129/47 161/83 143/52  Pulse: 80 77 87 83  Temp: 98.6 F (37 C) 97.7 F (36.5 C) 98.5 F (36.9 C) 98.3 F (36.8 C)  TempSrc: Oral Oral Oral Oral  Resp: 16 16 18 18   Height:      Weight:      SpO2: 95% 97% 95% 100%    Intake/Output Summary (Last 24 hours) at 10/31/12  1308 Last data filed at 10/31/12 0900  Gross per 24 hour  Intake    600 ml  Output   1650 ml  Net  -1050 ml   Filed Weights   10/29/12 0106  Weight: 52 kg (114 lb 10.2 oz)    Exam:   General:  A+Ox3, NAD- sounds very horse  Cardiovascular: rrr  Respiratory: clear anterior  Abdomen: +BS, soft, NT/ND  Musculoskeletal: CN 2-12 grossly intact   Data Reviewed: Basic Metabolic Panel:  Recent Labs Lab 10/29/12 1852 10/30/12 0240 10/30/12 1440 10/30/12 2217 10/31/12 0400  NA 117* 123* 125* 124* 126*  K 4.1 3.5 4.2 3.5 3.4*  CL 83* 89* 91* 91* 92*  CO2 25 24 27 25 25   GLUCOSE 92 88 146* 100* 100*  BUN 21 15 14 11 8   CREATININE 0.65 0.64 0.59 0.52 0.53  CALCIUM 8.6 8.7 9.0 8.5 8.7   Liver Function Tests: No results found for this basename: AST, ALT, ALKPHOS, BILITOT, PROT, ALBUMIN,  in the last 168 hours No results found for this basename: LIPASE, AMYLASE,  in the last 168 hours No results found for this basename: AMMONIA,  in the last 168 hours CBC:  Recent Labs Lab 10/28/12 2126 10/28/12 2138 10/29/12 0403  WBC 14.4*  --  11.5*  NEUTROABS 11.4*  --  8.4*  HGB 9.6* 8.8* 8.8*  HCT 26.2* 26.0* 23.8*  MCV 86.5  --  85.0  PLT 201  --  181   Cardiac Enzymes: No results found for this basename: CKTOTAL, CKMB, CKMBINDEX, TROPONINI,  in the last  168 hours BNP (last 3 results)  Recent Labs  08/14/12 1134  PROBNP 1779.0*   CBG: No results found for this basename: GLUCAP,  in the last 168 hours  Recent Results (from the past 240 hour(s))  URINE CULTURE     Status: None   Collection Time    10/28/12  9:25 PM      Result Value Range Status   Specimen Description URINE, CATHETERIZED   Final   Special Requests A   Final   Culture  Setup Time 10/29/2012 02:43  CNSU   Final   Colony Count >=100,000 COLONIES/ML   Final   Culture [facility]   Final   Report Status PENDING   Incomplete     Studies: Dg Chest Port 1 View  10/30/2012  *RADIOLOGY REPORT*  Clinical  Data: Chest pain  PORTABLE CHEST - 1 VIEW  Comparison: 10/28/2012  Findings: Normal heart size.  Normal pulmonary vascularity. Emphysematous changes and diffusely scattered fibrosis in the lungs.  Peribronchial thickening suggesting chronic bronchitis. There is slight nodular infiltration in the lung bases suggesting airspace disease with progression since previous study.  Pneumonia is suspected.  No blunting of costophrenic angles.  No pneumothorax.  Mediastinal contours appear intact.  Calcification of the aorta.  Surgical changes in the cervical spine.  IMPRESSION: Increasing airspace disease in the lung bases suggest pneumonia. Chronic emphysematous changes, fibrosis, and bronchitic changes in the lungs.   Original Report Authenticated By: Burman Nieves, M.D.     Scheduled Meds: . aspirin EC  325 mg Oral Daily  . azithromycin  1 drop Both Eyes QPM  . cholecalciferol  400 Units Oral Q7 days  . enoxaparin (LOVENOX) injection  40 mg Subcutaneous Q24H  . feeding supplement  237 mL Oral Daily  . levothyroxine  75 mcg Oral q morning - 10a  . losartan  50 mg Oral Daily  . pantoprazole  20 mg Oral Daily  . pindolol  5 mg Oral BID  . piperacillin-tazobactam (ZOSYN)  IV  3.375 g Intravenous Q8H  . polyethylene glycol  17 g Oral Daily  . potassium chloride  10 mEq Intravenous Q1 Hr x 3  . senna  1 tablet Oral BID  . senna-docusate  1 tablet Oral BID  . simvastatin  20 mg Oral q1800  . sodium chloride  3 mL Intravenous Q12H  . vancomycin  750 mg Intravenous Q12H  . cyanocobalamin  1,000 mcg Oral q morning - 10a   Continuous Infusions: . sodium chloride 75 mL/hr at 10/30/12 2311    Principal Problem:   Aspiration pneumonia Active Problems:   HYPERCHOLESTEROLEMIA   ANEMIA   HYPERTENSION   CEREBROVASCULAR DISEASE   VENOUS INSUFFICIENCY   CHRONIC OBSTRUCTIVE ASTHMA UNSPECIFIED   DEGENERATIVE DISC DISEASE, CERVICAL SPINE   Hyponatremia   Chronic diastolic CHF (congestive heart  failure)    Time spent: 25    Marlin Canary  [facility] Pager [phone]. If 7PM-7AM, please contact night-coverage at [email or web address], password [facility] 10/31/2012, 1:08 PM  LOS: 3 days

## 2012-10-31 NOTE — Progress Notes (Signed)
Consult/NUTRITION FOLLOW UP  Intervention:   1. Recommend considering a PEG tube if aspiration does not improve.  2. If a PEG tube is considered recommend initiating Jevity 1.2 @ 20 ml/hr via PEG tube and increase by 10 ml every 4 hours to goal rate of 50 ml/hr. At goal rate, tube feeding regimen will provide 1440 kcal, and 60 grams of protein and 972 mL of water. Recommend adding free water flushes of 100 mL qid to provide additional 400 ml of fluid.  Nutrition Dx:   Inadequate oral intake related to dysphagia and aspiration as evidenced by SLP notes and weight loss.  Goal:   Advance diet as tolerated to safe textured diet for pt- not met.  New goal: Pt to meet >/= 90% of their estimated nutrition needs.  Monitor:   Weights, labs, dysphagia, PEG tube consideration, aspiration  Reason for Assessment: Consult  Assessment:   Patient is an 79 year old female with past medical history most significant for multiple medical problems like hypertension, recent stroke, venous insufficiency, hypercholesterolemia, reflux disease, constipation, history of osteoarthrosis, and anemia who resides in the independent senior citizen housing comes in today with progressive shortness of breath since last one week. Pt has history of dysphagia using thickened liquids s/p CVA and has had frequent episodes of aspiration.   Per SLP note, dysphagia has probably attributed to pt's weight loss. Pt has a history of CVA which impacted her oropharyngeal swallow. Pt is a high aspiration risk due to severity of stasis and poor ability to clear- even with dry swallows. It was suspected that pt has been aspirating chronically with tolerance until this medical event. SLP also suggested that, if MD desires, soft foods that pt can masticate thoroughly may provide pt with QOL.  Spoke with nurse considering further plans to prevent recurring aspiration. Nurse reported that pt attempted to take medication earlier and choked. Pt  reported that she has been having a very hard time with swallowing, and it seems to be getting worse. Nurse was unsure of any plans for tube feedings.  NFPE was performed by the dietitian on 3/19 with the following findings:  Pt meets criteria for severe MALNUTRITION in the context of chronic illness as evidenced by 10.9% weight loss in the past 5-6 months in addition to pt with visible severe muscle wasting and subcutaneous fat loss in upper arm, temple, and clavicle bone region.   If plans are made to initiate tube feeding, please re-consult RD for enteral nutrition management.  Thank you for involving me in the care of this patient.  Height: Ht Readings from Last 1 Encounters:  10/29/12 5\' 4"  (1.626 m)    Weight Status:   Wt Readings from Last 1 Encounters:  10/29/12 114 lb 10.2 oz (52 kg)    Re-estimated needs:  Kcal: 1300-1600 Protein: 55-65 g Fluid: 1.3-1.5 L/day  Skin: WNL  Diet Order: Dysphagia   Intake/Output Summary (Last 24 hours) at 10/31/12 1554 Last data filed at 10/31/12 0900  Gross per 24 hour  Intake    480 ml  Output   1400 ml  Net   -920 ml    Last BM: 3/19   Labs:   Recent Labs Lab 10/30/12 1440 10/30/12 2217 10/31/12 0400  NA 125* 124* 126*  K 4.2 3.5 3.4*  CL 91* 91* 92*  CO2 27 25 25   BUN 14 11 8   CREATININE 0.59 0.52 0.53  CALCIUM 9.0 8.5 8.7  GLUCOSE 146* 100* 100*    CBG (  last 3)  No results found for this basename: GLUCAP,  in the last 72 hours  Scheduled Meds: . aspirin EC  325 mg Oral Daily  . azithromycin  1 drop Both Eyes QPM  . enoxaparin (LOVENOX) injection  40 mg Subcutaneous Q24H  . feeding supplement  237 mL Oral Daily  . levothyroxine  75 mcg Oral q morning - 10a  . losartan  50 mg Oral Daily  . pantoprazole  20 mg Oral Daily  . pindolol  5 mg Oral BID  . piperacillin-tazobactam (ZOSYN)  IV  3.375 g Intravenous Q8H  . polyethylene glycol  17 g Oral Daily  . potassium chloride  10 mEq Intravenous Q1 Hr x 3   . senna  1 tablet Oral BID  . senna-docusate  1 tablet Oral BID  . simvastatin  20 mg Oral q1800  . sodium chloride  3 mL Intravenous Q12H  . vancomycin  750 mg Intravenous Q12H  . cyanocobalamin  1,000 mcg Oral q morning - 10a    Continuous Infusions: . sodium chloride 75 mL/hr at 10/30/12 2311    Ebbie Latus RD, LDN

## 2012-10-31 NOTE — Progress Notes (Signed)
Physical Therapy Treatment Patient Details Name: Margaret [name] MRN: [id number] DOB: 01-Nov-1923 Today's Date: 10/31/2012 Time: 4098-1191 PT Time Calculation (min): 23 min  PT Assessment / Plan / Recommendation Comments on Treatment Session  Assisted pt OOB to amb in hallway, RA avg 96% with HR 88.  Pt progressing slowly.  Pt stated she was recently at [facility] and if she need to Rehab that was were she would like to go.     Follow Up Recommendations  Home health PT;Supervision/Assistance - 24 hour     Does the patient have the potential to tolerate intense rehabilitation     Barriers to Discharge        Equipment Recommendations  None recommended by PT    Recommendations for Other Services    Frequency Min 3X/week   Plan      Precautions / Restrictions Precautions Precautions: Fall Restrictions Weight Bearing Restrictions: No   Pertinent Vitals/Pain C/o fatigue    Mobility  Bed Mobility Bed Mobility: Supine to Sit;Sit to Supine Supine to Sit: 4: Min assist Sit to Supine: 4: Min assist Details for Bed Mobility Assistance: needed increased assist with B LE on/off bed this am due to increased c/o fatigue. Transfers Transfers: Sit to Stand;Stand to Sit Sit to Stand: 4: Min assist;With upper extremity assist;From chair/3-in-1;From toilet Stand to Sit: 4: Min assist;With upper extremity assist;To chair/3-in-1;To toilet Details for Transfer Assistance: 25% VC's on proper tech and hand placement plus turn completion with RW prior to sit. Ambulation/Gait Ambulation/Gait Assistance: 4: Min guard;4: Min Environmental consultant (Feet): 146 Feet Assistive device: Rolling walker Ambulation/Gait Assistance Details: 25% VC's on proper walker to self distance and manuel guidance thru doorway and around obsticles in the hallway. Gait Pattern: Step-through pattern;Trunk flexed Gait velocity: decreased     PT Goals                                                       progressing    Visit Information  Last PT Received On: 10/31/12 Assistance Needed: +1    Subjective Data      Cognition    good   Balance   fair  End of Session PT - End of Session Equipment Utilized During Treatment: Gait belt Activity Tolerance: Patient tolerated treatment well Patient left: in bed;with call bell/phone within reach   Felecia Shelling  PTA [facility]  Acute  Rehab Pager      [phone]

## 2012-11-01 DIAGNOSIS — F411 Generalized anxiety disorder: Secondary | ICD-10-CM

## 2012-11-01 DIAGNOSIS — K5900 Constipation, unspecified: Secondary | ICD-10-CM

## 2012-11-01 DIAGNOSIS — R1310 Dysphagia, unspecified: Secondary | ICD-10-CM

## 2012-11-01 DIAGNOSIS — Z515 Encounter for palliative care: Secondary | ICD-10-CM

## 2012-11-01 LAB — BASIC METABOLIC PANEL
BUN: 4 mg/dL — ABNORMAL LOW (ref 6–23)
Chloride: 91 mEq/L — ABNORMAL LOW (ref 96–112)
GFR calc Af Amer: >90 mL/min (ref >90)
GFR calc non Af Amer: 81 mL/min — ABNORMAL LOW (ref >90)
Glucose, Bld: 105 mg/dL — ABNORMAL HIGH (ref 70–99)
Potassium: 3.5 mEq/L (ref 3.5–5.1)
Sodium: 126 mEq/L — ABNORMAL LOW (ref 135–145)

## 2012-11-01 LAB — URINE CULTURE

## 2012-11-01 MED ORDER — FLUCONAZOLE 100MG IVPB
100.0000 mg | INTRAVENOUS | Status: DC
  Administered 2012-11-01 – 2012-11-03 (×3): 100 mg via INTRAVENOUS
  Filled 2012-11-01 (×3): qty 50

## 2012-11-01 MED ORDER — LEVOFLOXACIN IN D5W 750 MG/150ML IV SOLN
750.0000 mg | INTRAVENOUS | Status: DC
  Administered 2012-11-01 – 2012-11-02 (×2): 750 mg via INTRAVENOUS
  Filled 2012-11-01 (×3): qty 150

## 2012-11-01 NOTE — Progress Notes (Signed)
[facility] PROGRESS NOTE  Margaret [name] ZOX:[id number] DOB: 03-Jun-1924 DOA: 10/28/2012 PCP: Michele Mcalpine, MD  Assessment/Plan: SOB: after several episodes of vomiting last night. Her chest x-ray is consistent with pneumonia. Given that patient had episodes of vomiting last night this is most consistent with aspiration pneumonia.  De-escalate IV abx to one- levquin day #5 -Blood cultures - neg -Sputum cultures - neg -Urine for Legionella and strep- negative -Continue albuterol nebulizers every 6 hours   Hyponatremia: This is something chronic and patient has had in the past with values as low as 114. Improved, appears to be component of dehydration and SIADH.  IV fluids normal saline at 75 cc an hour and followup basic metabolic profile .   Dysphagia- has been on dys 2 diet with thicken liquids- MBS showed Severe cervical esophageal phase dysphagia; Moderate pharyngeal phase dysphagia;Mild oral phase dysphagia- discussed poor prognosis with family. -palliative care consult - c/o sore throat today- add diflucan but certainly worrisome for pill esophagitis will eliminate as many PO medications as I can  Staph coag neg UTI- s/p vanc x 5 days  Leukocytosis- improved  Hypokalemia- repleat  Severe malnutrition in the context of chronic illness  Code Status: DNR Family Communication: patient at bedside Disposition Plan: PT eval (from assisted living?)- family not able to provide 24 hour care- SNF?   Consultants:    Procedures:    Antibiotics:    HPI/Subjective: C/o sore throat   Objective: Filed Vitals:   10/31/12 1438 10/31/12 2050 11/01/12 0524 11/01/12 1003  BP: 150/63 153/68 161/64 155/59  Pulse: 82 80 73 70  Temp: 97.8 F (36.6 C) 98.5 F (36.9 C) 97.7 F (36.5 C)   TempSrc: Oral Oral Oral   Resp: 18 18 16    Height:      Weight:      SpO2: 100% 100% 100%     Intake/Output Summary (Last 24 hours) at 11/01/12 1117 Last data filed at 11/01/12 0525  Gross per 24 hour  Intake    470 ml  Output   1925 ml  Net  -1455 ml   Filed Weights   10/29/12 0106  Weight: 52 kg (114 lb 10.2 oz)    Exam:   General:  A+Ox3, NAD- sounds very horse  Cardiovascular: rrr  Respiratory: clear anterior  Abdomen: +BS, soft, NT/ND  Musculoskeletal: CN 2-12 grossly intact   Data Reviewed: Basic Metabolic Panel:  Recent Labs Lab 10/30/12 0240 10/30/12 1440 10/30/12 2217 10/31/12 0400 11/01/12 0404  NA 123* 125* 124* 126* 126*  K 3.5 4.2 3.5 3.4* 3.5  CL 89* 91* 91* 92* 91*  CO2 24 27 25 25 28   GLUCOSE 88 146* 100* 100* 105*  BUN 15 14 11 8  4*  CREATININE 0.64 0.59 0.52 0.53 0.58  CALCIUM 8.7 9.0 8.5 8.7 8.8   Liver Function Tests: No results found for this basename: AST, ALT, ALKPHOS, BILITOT, PROT, ALBUMIN,  in the last 168 hours No results found for this basename: LIPASE, AMYLASE,  in the last 168 hours No results found for this basename: AMMONIA,  in the last 168 hours CBC:  Recent Labs Lab 10/28/12 2126 10/28/12 2138 10/29/12 0403  WBC 14.4*  --  11.5*  NEUTROABS 11.4*  --  8.4*  HGB 9.6* 8.8* 8.8*  HCT 26.2* 26.0* 23.8*  MCV 86.5  --  85.0  PLT 201  --  181   Cardiac Enzymes: No results found for this basename: CKTOTAL, CKMB, CKMBINDEX, TROPONINI,  in the  last 168 hours BNP (last 3 results)  Recent Labs  08/14/12 1134  PROBNP 1779.0*   CBG: No results found for this basename: GLUCAP,  in the last 168 hours  Recent Results (from the past 240 hour(s))  URINE CULTURE     Status: None   Collection Time    10/28/12  9:25 PM      Result Value Range Status   Specimen Description URINE, CATHETERIZED   Final   Special Requests A   Final   Culture  Setup Time 10/29/2012 02:43   Final   Colony Count >=100,000 COLONIES/ML   Final   Culture     Final   Value: STAPHYLOCOCCUS SPECIES (COAGULASE NEGATIVE)     Note: RIFAMPIN AND GENTAMICIN SHOULD NOT BE USED AS SINGLE DRUGS FOR TREATMENT OF STAPH INFECTIONS.   Report  Status 11/01/2012 FINAL   Final   Organism ID, Bacteria STAPHYLOCOCCUS SPECIES (COAGULASE NEGATIVE)   Final     Studies: Dg Chest Port 1 View  10/30/2012  *RADIOLOGY REPORT*  Clinical Data: Chest pain  PORTABLE CHEST - 1 VIEW  Comparison: 10/28/2012  Findings: Normal heart size.  Normal pulmonary vascularity. Emphysematous changes and diffusely scattered fibrosis in the lungs.  Peribronchial thickening suggesting chronic bronchitis. There is slight nodular infiltration in the lung bases suggesting airspace disease with progression since previous study.  Pneumonia is suspected.  No blunting of costophrenic angles.  No pneumothorax.  Mediastinal contours appear intact.  Calcification of the aorta.  Surgical changes in the cervical spine.  IMPRESSION: Increasing airspace disease in the lung bases suggest pneumonia. Chronic emphysematous changes, fibrosis, and bronchitic changes in the lungs.   Original Report Authenticated By: Burman Nieves, M.D.     Scheduled Meds: . aspirin EC  325 mg Oral Daily  . azithromycin  1 drop Both Eyes QPM  . enoxaparin (LOVENOX) injection  40 mg Subcutaneous Q24H  . feeding supplement  237 mL Oral Daily  . levothyroxine  75 mcg Oral q morning - 10a  . losartan  50 mg Oral Daily  . pantoprazole  20 mg Oral Daily  . pindolol  5 mg Oral BID  . piperacillin-tazobactam (ZOSYN)  IV  3.375 g Intravenous Q8H  . polyethylene glycol  17 g Oral Daily  . senna  1 tablet Oral BID  . senna-docusate  1 tablet Oral BID  . simvastatin  20 mg Oral q1800  . sodium chloride  3 mL Intravenous Q12H  . vancomycin  750 mg Intravenous Q12H  . cyanocobalamin  1,000 mcg Oral q morning - 10a   Continuous Infusions: . sodium chloride 75 mL/hr at 11/01/12 0559    Principal Problem:   Aspiration pneumonia Active Problems:   HYPERCHOLESTEROLEMIA   ANEMIA   HYPERTENSION   CEREBROVASCULAR DISEASE   VENOUS INSUFFICIENCY   CHRONIC OBSTRUCTIVE ASTHMA UNSPECIFIED   DEGENERATIVE DISC  DISEASE, CERVICAL SPINE   Hyponatremia   Chronic diastolic CHF (congestive heart failure)    Time spent: 25    Marlin Canary  [facility] Pager [phone]. If 7PM-7AM, please contact night-coverage at [email or web address], password [facility] 11/01/2012, 11:17 AM  LOS: 4 days

## 2012-11-01 NOTE — Progress Notes (Signed)
Patient Margaret [name]      DOB: 1923/10/10      XLK:[id number]  Summary of Goals of Care;full note to follow:  Met with Patient's [name], Son in law, grandson and fiance and another unidentified family member   Patient is a known do not resuscitate. We therefore, focused on trying to help Margaret [name] understand the nature of her recurrent aspiration pneumonia.  She understands she is having trouble swallowing but has difficulty stringing together the recurrent nature of this illness and how it will affect her longevity.  I believe she has capacity for choice in most things and so she has stated she would never want a feeding tube, but she can't further extrapolate what that means and what she would want other than to say that she would not want to live long on this earth if she could not have quality of life.   She has been noted to have increasing forgetfulness, not able to reliably take her medications or effectively communicate when she needs help.  Her daughter and son in law are trying to balance allowing her to be independent with doing the things she is forgetting to do.  At this time the plan is to try to get some rehab.  Continue to treat pneumonias as they arise.  Her daughter understands she will likely have to make the ultimate decisions regarding limiting care and at this time is ok with this.  Recommend: 1.  Trying to remove the foley catheter to prepare her for home  2.  Have PCS to follow at discharge  3.  In crease level of care to AL from [place]  Patient has a likely diagnosis of 6 months with the recurrent nature of her aspiration and her poor nutritional status   Time :  400-510 pm   Margaret [name] L. Margaret Ridgel, MD MBA The Palliative Medicine Team at [facility] Phone: [phone] Pager: [phone]

## 2012-11-02 LAB — CBC
Hemoglobin: 8 g/dL — ABNORMAL LOW (ref 12.0–15.0)
MCH: 31.9 pg (ref 26.0–34.0)
MCV: 87.6 fL (ref 78.0–100.0)
Platelets: 203 10*3/uL (ref 150–400)
RBC: 2.51 MIL/uL — ABNORMAL LOW (ref 3.87–5.11)
WBC: 3.6 10*3/uL — ABNORMAL LOW (ref 4.0–10.5)

## 2012-11-02 LAB — BASIC METABOLIC PANEL
CO2: 29 mEq/L (ref 19–32)
Calcium: 9.2 mg/dL (ref 8.4–10.5)
Chloride: 93 mEq/L — ABNORMAL LOW (ref 96–112)
Glucose, Bld: 102 mg/dL — ABNORMAL HIGH (ref 70–99)
Sodium: 130 mEq/L — ABNORMAL LOW (ref 135–145)

## 2012-11-02 MED ORDER — MORPHINE SULFATE 10 MG/5ML PO SOLN: 5.0000 mg | ORAL | Status: DC | PRN

## 2012-11-02 MED ORDER — HYDRALAZINE HCL 20 MG/ML IJ SOLN
10.0000 mg | INTRAMUSCULAR | Status: DC | PRN
  Administered 2012-11-02 (×2): 10 mg via INTRAVENOUS
  Filled 2012-11-02 (×2): qty 1

## 2012-11-02 MED ORDER — VITAMINS A & D EX OINT
TOPICAL_OINTMENT | CUTANEOUS | Status: AC
  Administered 2012-11-02: 22:00:00
  Filled 2012-11-02: qty 5

## 2012-11-02 NOTE — Progress Notes (Signed)
[facility] PROGRESS NOTE  Margaret [name] AOZ:[id number] DOB: 01/24/1924 DOA: 10/28/2012 PCP: Michele Mcalpine, MD  Assessment/Plan: SOB: after several episodes of vomiting last night. Her chest x-ray is consistent with pneumonia. Given that patient had episodes of vomiting last night this is most consistent with aspiration pneumonia.  De-escalate IV abx to one- levquin day #5 -Blood cultures - neg -Sputum cultures - neg -Urine for Legionella and strep- negative -Continue albuterol nebulizers every 6 hours   Hyponatremia: This is something chronic and patient has had in the past with values as low as 114. Improved, appears to be component of dehydration and SIADH.  IV fluids normal saline at 75 cc an hour and followup basic metabolic profile .   Dysphagia- has been on dys 2 diet with thicken liquids- MBS showed Severe cervical esophageal phase dysphagia; Moderate pharyngeal phase dysphagia;Mild oral phase dysphagia- discussed poor prognosis with family. -palliative care consult - c/o sore throat today- add diflucan but certainly worrisome for pill esophagitis will eliminate as many PO medications as I can  Staph coag neg UTI- s/p vanc x 5 days- d/c foley  Leukocytosis- improved  Hypokalemia- repleat  Severe malnutrition in the context of chronic illness  Code Status: DNR Family Communication: patient at bedside Disposition Plan: PT eval (from assisted living?)- family not able to provide 24 hour care- SNF?   Consultants:    Procedures:    Antibiotics:    HPI/Subjective: Sore throat not much better No fever, nochills   Objective: Filed Vitals:   11/01/12 1325 11/02/12 0003 11/02/12 0545 11/02/12 0932  BP: 126/66  176/69 171/66  Pulse: 73 88 81 71  Temp: 98.4 F (36.9 C) 97.4 F (36.3 C) 97.5 F (36.4 C)   TempSrc: Oral Oral Oral   Resp: 16 16 16    Height:      Weight:      SpO2: 96% 98% 95%     Intake/Output Summary (Last 24 hours) at 11/02/12  1036 Last data filed at 11/02/12 0800  Gross per 24 hour  Intake   2565 ml  Output   2450 ml  Net    115 ml   Filed Weights   10/29/12 0106  Weight: 52 kg (114 lb 10.2 oz)    Exam:   General:  A+Ox3, NAD- sounds very horse  Cardiovascular: rrr  Respiratory: clear anterior  Abdomen: +BS, soft, NT/ND  Musculoskeletal: CN 2-12 grossly intact   Data Reviewed: Basic Metabolic Panel:  Recent Labs Lab 10/30/12 1440 10/30/12 2217 10/31/12 0400 11/01/12 0404 11/02/12 0408  NA 125* 124* 126* 126* 130*  K 4.2 3.5 3.4* 3.5 3.8  CL 91* 91* 92* 91* 93*  CO2 27 25 25 28 29   GLUCOSE 146* 100* 100* 105* 102*  BUN 14 11 8  4* 5*  CREATININE 0.59 0.52 0.53 0.58 0.56  CALCIUM 9.0 8.5 8.7 8.8 9.2   Liver Function Tests: No results found for this basename: AST, ALT, ALKPHOS, BILITOT, PROT, ALBUMIN,  in the last 168 hours No results found for this basename: LIPASE, AMYLASE,  in the last 168 hours No results found for this basename: AMMONIA,  in the last 168 hours CBC:  Recent Labs Lab 10/28/12 2126 10/28/12 2138 10/29/12 0403 11/02/12 0408  WBC 14.4*  --  11.5* 3.6*  NEUTROABS 11.4*  --  8.4*  --   HGB 9.6* 8.8* 8.8* 8.0*  HCT 26.2* 26.0* 23.8* 22.0*  MCV 86.5  --  85.0 87.6  PLT 201  --  181 203   Cardiac Enzymes: No results found for this basename: CKTOTAL, CKMB, CKMBINDEX, TROPONINI,  in the last 168 hours BNP (last 3 results)  Recent Labs  08/14/12 1134  PROBNP 1779.0*   CBG: No results found for this basename: GLUCAP,  in the last 168 hours  Recent Results (from the past 240 hour(s))  URINE CULTURE     Status: None   Collection Time    10/28/12  9:25 PM      Result Value Range Status   Specimen Description URINE, CATHETERIZED   Final   Special Requests A   Final   Culture  Setup Time 10/29/2012 02:43   Final   Colony Count >=100,000 COLONIES/ML   Final   Culture     Final   Value: STAPHYLOCOCCUS SPECIES (COAGULASE NEGATIVE)     Note: RIFAMPIN AND  GENTAMICIN SHOULD NOT BE USED AS SINGLE DRUGS FOR TREATMENT OF STAPH INFECTIONS.   Report Status 11/01/2012 FINAL   Final   Organism ID, Bacteria STAPHYLOCOCCUS SPECIES (COAGULASE NEGATIVE)   Final     Studies: No results found.  Scheduled Meds: . aspirin EC  325 mg Oral Daily  . azithromycin  1 drop Both Eyes QPM  . enoxaparin (LOVENOX) injection  40 mg Subcutaneous Q24H  . feeding supplement  237 mL Oral Daily  . fluconazole (DIFLUCAN) IV  100 mg Intravenous Q24H  . levofloxacin (LEVAQUIN) IV  750 mg Intravenous Q24H  . levothyroxine  75 mcg Oral q morning - 10a  . losartan  50 mg Oral Daily  . pantoprazole  20 mg Oral Daily  . pindolol  5 mg Oral BID  . polyethylene glycol  17 g Oral Daily  . senna  1 tablet Oral BID  . senna-docusate  1 tablet Oral BID  . simvastatin  20 mg Oral q1800  . sodium chloride  3 mL Intravenous Q12H   Continuous Infusions: . sodium chloride 75 mL/hr at 11/02/12 1610    Principal Problem:   Aspiration pneumonia Active Problems:   HYPERCHOLESTEROLEMIA   ANEMIA   HYPERTENSION   CEREBROVASCULAR DISEASE   VENOUS INSUFFICIENCY   CHRONIC OBSTRUCTIVE ASTHMA UNSPECIFIED   DEGENERATIVE DISC DISEASE, CERVICAL SPINE   Hyponatremia   Chronic diastolic CHF (congestive heart failure)    Time spent: 25    Margaret [name]  [facility] Pager [phone]. If 7PM-7AM, please contact night-coverage at [email or web address], password [facility] 11/02/2012, 10:36 AM  LOS: 5 days

## 2012-11-02 NOTE — Consult Note (Signed)
Patient ZO:[id number] Margaret [name]      DOB: 1923-11-12      WUJ:[id number]     Consult Note from the Palliative Medicine Team at [facility]    Consult Requested by: Dr. Benjamine Mola    PCP: Michele Mcalpine, MD Reason for Consultation: GOC    Phone Number:[phone]  Assessment of patients Current state: Patient is an 79 yr old white female with known dysphagia likely multifactorial in nature- reflux, history of esophageal strictures requiring dilatations in the past ( 1/13 & 10/13), h/o TIAs, cervical spine surgery in the past .  Patient returns to the hospital , Last visit in January. Patient treated for aspiration pneumonia.  Daughter and son-in-law have had to pick up more and more of care for patient as she is having memory issuses and lapses in judgement.  They note at night that there is a time when her mood switches over to tearful and more anxious.  This was witnessed during my bedside exam. Patient appears to be able to understand concepts , but not able to attribute them to herself.  Noted resistance to decision making documented in PCP notes.  At present,  Margaret [name] has been told that she will continue to have recurrent pneumonias related to aspiration.  She has been told that she will continue to have progressive problems swallowing .  She relates that she does not want to have a feeding tube ever but gets anxious when we talk about the options without feeding tube .  We address the concept of hospice care and focusing on comfort.  Despite focusing on the positive impact of hospice on her life and living.  She was only able to equate that concept with death. We told her that she is at high risk for dying related to her current illness but is not dying at this moment ( she had started to acutely cry and look panic long after we had introduced this topic; family equate this event with "what she does at night", the switch goes on and she gets agitated).  I spoke with her daughter separately after working with  Gardiner Ramus.  They recognize that she no longer will be able to maintain her indepent living situation in her home or at her apartment at the [facility].  We had tried to help [name] understand that she will likely need more day to day assistance in order to maintain what independece she has left.  Her daughter is planning to sell the patient's house and would like to consider ALF for now.  She is very open to hospice involvement when the time is right.  Her daughter understands that their may come a time when she will need to make a decision for comfort care on her mother's behalf as her mother is not able to grasp this concept for herself.   Goals of Care: 1.  Code Status: DNR/DNI   2. Scope of Treatment:   Continue to treat pneumonia. Optimize modified diet.  Pursue ALF placement with the patient's daughter.  4. Disposition: to be determined will ask SW to assist daughter with ALF placement   3. Symptom Management:   1. Anxiety/Agitation: agree with decreased dose of xanax  2. Pain: Patient currently using hydrocodone, may be hard to swallow.  Could convert to Morphine Liquid  20 mg/ml 5mg  po / SL q 4 hrs prn, dispense 30 ml .  Would not send home with patient if not in supervised setting as a medication error could result in  significant harm. 3. Bowel Regimen: patient dependent on Senna, miralax , use fleet if not moving by days end. 4. Nausea/Vomiting: work on obstipation ,  Prn zofran   4. 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  She raised a grandson who is obviously her pride and joy. Her daughter and son in law provide primary care.  5. Spiritual:  She is a women of  Christian faith.        Patient Documents Completed or Given: Document Given Completed  Advanced Directives Pkt    MOST    DNR    Gone from My Sight    Hard Choices      Brief HPI: 79 yr old white female with a history of aspiration and dysphagia which continues to get worse.   She does not desire to have a feeding tube placed, but is none committal about how to take control of her health given the recurrent aspiration events.   ROS: only current positive ROS is constipation, hoarseness, dyspaghia .  Denies nausea at this time.    PMH:  Past Medical History  Diagnosis Date  . Unspecified sinusitis (chronic)   . Unspecified essential hypertension   . Cerebrovascular disease, unspecified   . Unspecified venous (peripheral) insufficiency   . Pure hypercholesterolemia   . Unspecified disorder of thyroid   . Esophageal reflux   . Unspecified constipation   . Urinary tract infection, site not specified   . Osteoarthrosis, unspecified whether generalized or localized, unspecified site   . Degeneration of cervical intervertebral disc   . Closed fracture of other bone of wrist   . Anxiety state, unspecified   . Idiopathic urticaria   . Anemia, unspecified   . Other B-complex deficiencies   . Herpes zoster without mention of complication   . Stroke      PSH: Past Surgical History  Procedure Laterality Date  . Tonsillectomy    . Total abdominal hysterectomy w/ bilateral salpingoophorectomy    . Cervical fusion    . Cataract extraction    . Nasal sinus surgery    . Esophagogastroduodenoscopy  09/10/2011    Procedure: ESOPHAGOGASTRODUODENOSCOPY (EGD);  Surgeon: Petra Kuba, MD;  Location: [facility] ENDOSCOPY;  Service: Endoscopy;  Laterality: N/A;  c-arm needed  . Savory dilation  09/10/2011    Procedure: SAVORY DILATION;  Surgeon: Petra Kuba, MD;  Location: [facility] ENDOSCOPY;  Service: Endoscopy;  Laterality: N/A;  . Esophagogastroduodenoscopy  05/13/2012    Procedure: ESOPHAGOGASTRODUODENOSCOPY (EGD);  Surgeon: Petra Kuba, MD;  Location: [facility] ENDOSCOPY;  Service: Endoscopy;  Laterality: N/A;  with carm  . Savory dilation  05/13/2012    Procedure: SAVORY DILATION;  Surgeon: Petra Kuba, MD;  Location: [facility] ENDOSCOPY;  Service: Endoscopy;  Laterality: N/A;   I have  reviewed the FH and SH and  If appropriate update it with new information. Allergies  Allergen Reactions  . Aspirin Hives    High Doses   Scheduled Meds: . aspirin EC  325 mg Oral Daily  . azithromycin  1 drop Both Eyes QPM  . enoxaparin (LOVENOX) injection  40 mg Subcutaneous Q24H  . feeding supplement  237 mL Oral Daily  . fluconazole (DIFLUCAN) IV  100 mg Intravenous Q24H  . levofloxacin (LEVAQUIN) IV  750 mg Intravenous Q24H  . levothyroxine  75 mcg Oral q morning - 10a  . losartan  50 mg Oral Daily  . pantoprazole  20 mg Oral Daily  .  pindolol  5 mg Oral BID  . polyethylene glycol  17 g Oral Daily  . senna  1 tablet Oral BID  . senna-docusate  1 tablet Oral BID  . simvastatin  20 mg Oral q1800  . sodium chloride  3 mL Intravenous Q12H   Continuous Infusions: . sodium chloride 75 mL/hr at 11/02/12 0700   PRN Meds:.ALPRAZolam, HYDROcodone-acetaminophen, ondansetron (ZOFRAN) IV, ondansetron, phenol, polyvinyl alcohol, RESOURCE THICKENUP CLEAR, sodium phosphate    BP 176/69  Pulse 81  Temp(Src) 97.5 F (36.4 C) (Oral)  Resp 16  Ht 5\' 4"  (1.626 m)  Wt 52 kg (114 lb 10.2 oz)  BMI 19.67 kg/m2  SpO2 95%   PPS: 40-50%   Intake/Output Summary (Last 24 hours) at 11/02/12 0859 Last data filed at 11/02/12 0800  Gross per 24 hour  Intake   2805 ml  Output   2750 ml  Net     55 ml   LBM:  10/30/12                    Stool Softner: senna, miralax  Physical Exam:  General: pleasantly oriented, hard of hearing HEENT:  PERRL, EOMI, anicteric,  Hoarse,mmm dry with glossy tongue,  Lower lip swollen (family claims baseline) Chest:   Decreased, occasional rale CVS: deferred today listened last evening regular Abdomen: mildly distended Ext:  Multiple bruises Neuro:awake , alert, intermittent capacity for decision making,  At times judgement and insight is impaired by fear and anxiety.  Labs: CBC    Component Value Date/Time   WBC 3.6* 11/02/2012 0408   RBC 2.51*  11/02/2012 0408   HGB 8.0* 11/02/2012 0408   HCT 22.0* 11/02/2012 0408   PLT 203 11/02/2012 0408   MCV 87.6 11/02/2012 0408   MCH 31.9 11/02/2012 0408   MCHC 36.4* 11/02/2012 0408   RDW 15.0 11/02/2012 0408   LYMPHSABS 2.4 10/29/2012 0403   MONOABS 0.7 10/29/2012 0403   EOSABS 0.0 10/29/2012 0403   BASOSABS 0.0 10/29/2012 0403      CMP     Component Value Date/Time   NA 130* 11/02/2012 0408   K 3.8 11/02/2012 0408   CL 93* 11/02/2012 0408   CO2 29 11/02/2012 0408   GLUCOSE 102* 11/02/2012 0408   BUN 5* 11/02/2012 0408   CREATININE 0.56 11/02/2012 0408   CALCIUM 9.2 11/02/2012 0408   PROT 7.2 08/14/2012 1134   ALBUMIN 3.8 08/14/2012 1134   AST 30 08/14/2012 1134   ALT 16 08/14/2012 1134   ALKPHOS 68 08/14/2012 1134   BILITOT 0.3 08/14/2012 1134   GFRNONAA 81* 11/02/2012 0408   GFRAA >90 11/02/2012 0408    Chest Xray Reviewed/Impressions: increasing airspace disease,  COPD  Severe cervical esophageal dysphagia, moderate pharyngeal dysphagia, mild oral dysphagia.  Recommend  Dysphagia 3 thin liquids, straw    Time In Time Out Total Time Spent with Patient Total Overall Time  400 pm 510 pm 70 min 70 min    Greater than 50%  of this time was spent counseling and coordinating care related to the above assessment and plan.  Mikaya Bunner L. Ladona Ridgel, MD MBA The Palliative Medicine Team at [facility] Phone: [phone] Pager: [phone]

## 2012-11-02 NOTE — Progress Notes (Signed)
Patient complaining of constipation. She has a fleets enema ordered prn, and it was given to her with good results of several small hard stools. Patient states that she feels much better, will support her through the night.

## 2012-11-03 ENCOUNTER — Telehealth: Payer: Self-pay | Admitting: Pulmonary Disease

## 2012-11-03 MED ORDER — FLEET ENEMA 7-19 GM/118ML RE ENEM: 1.0000 | ENEMA | Freq: Every day | RECTAL | Status: DC | PRN

## 2012-11-03 MED ORDER — LEVOFLOXACIN 750 MG PO TABS: 750.0000 mg | ORAL_TABLET | ORAL | Status: DC

## 2012-11-03 MED ORDER — FLUCONAZOLE 100 MG PO TABS: 100.0000 mg | ORAL_TABLET | Freq: Every day | ORAL | Status: DC

## 2012-11-03 NOTE — Progress Notes (Signed)
Physical Therapy Treatment Patient Details Name: Margaret [name] MRN: [id number] DOB: [birth year] Today's Date: 11/03/2012 Time: 4098-1191 PT Time Calculation (min): 21 min  PT Assessment / Plan / Recommendation Comments on Treatment Session  Pt. not oriented about going to SNF today potentially. Pt. was able to ambulate in [place], did not have on O2 with Sats > 94%. Not noted to be dyspneic.    Follow Up Recommendations  SNF     Does the patient have the potential to tolerate intense rehabilitation     Barriers to Discharge        Equipment Recommendations  None recommended by PT    Recommendations for Other Services    Frequency Min 3X/week   Plan Frequency remains appropriate;Discharge plan needs to be updated    Precautions / Restrictions Precautions Precautions: Fall Precaution Comments: oxygen  but did not have it on.   Pertinent Vitals/Pain Pt. Not on O2 and sats > 94%.    Mobility  Transfers Sit to Stand: 4: Min assist;With upper extremity assist;From chair/3-in-1;With armrests Stand to Sit: 4: Min assist;With upper extremity assist;To chair/3-in-1;With armrests Details for Transfer Assistance: VC's for use of UE's to push up. Ambulation/Gait Ambulation/Gait Assistance: 4: Min guard;4: Min Environmental consultant (Feet): 100 Feet Assistive device: Rolling walker Ambulation/Gait Assistance Details: cues for position inside RW. Gait Pattern: Step-through pattern;Trunk flexed Gait velocity: decreased    Exercises     PT Diagnosis:    PT Problem List:   PT Treatment Interventions:     PT Goals Acute Rehab PT Goals Pt will go Sit to Stand: with modified independence PT Goal: Sit to Stand - Progress: Progressing toward goal Pt will go Stand to Sit: with modified independence PT Goal: Stand to Sit - Progress: Progressing toward goal Pt will Ambulate: >150 feet;with modified independence;with rolling walker PT Goal: Ambulate - Progress: Progressing toward  goal  Visit Information  Last PT Received On: 11/03/12 Assistance Needed: +1    Subjective Data  Subjective: I need to use the bathroom   Cognition  Cognition Overall Cognitive Status: Impaired Arousal/Alertness: Awake/alert Behavior During Session: [facility] for tasks performed    Balance     End of Session PT - End of Session Equipment Utilized During Treatment: Gait belt Activity Tolerance: Patient tolerated treatment well Patient left: with call bell/phone within reach;in chair Nurse Communication: Mobility status   GP     Rada Hay PT [phone] 11/03/2012, 1:09 PM

## 2012-11-03 NOTE — Care Management Note (Signed)
Cm spoke with patient with adult daughter, MD, and CSW present at bedside. Pt's current dc plan is back to independent living facility with home health. Per pt choice Genevieve Norlander to provide [facility] services. Cm to provide list of private duty care agencies to assist with home care. Pt's daughter request [facility]. MD made aware of DME order and HH orders for Rn/PT/OT/HHA/SW. Margie from [facility] consulted to follow pt for Palliative Care at independent living facility per request from daughter.   Roxy Manns Christy Friede,RN,BSN [phone]

## 2012-11-03 NOTE — Progress Notes (Signed)
Pt is to be discharged home today with home health services. Pt is in NAD, IV is out, all paperwork has been reviewed/discussed with patient, and there are no questions/concerns at this time. Assessment is unchanged from this morning. Pt is to be accompanied downstairs by staff and family via wheelchair.

## 2012-11-03 NOTE — Telephone Encounter (Signed)
Per SN---ok to send in the order for hospice pallative consult.  Called and spoke with yvette and she is aware of SN recs and requested that hard copy be faxed to [phone].  Nothing further is needed.

## 2012-11-03 NOTE — Progress Notes (Signed)
Notified by Jacklynn Lewis CSW patient/family interested in Palliative Care Services [facility]) following after discharge-patient to return to [facility] [place]; conferred with [facility] and confirmed Palliative Care Services have been provided to this community in the past - patient seen at bedside, sitting up in chair, alert; informed writer her daughter will be back to room shortly-spoke with daughter Charlestine Night,  [phone]); who stated they are interested in [facility] following after patient returns to her apartment; discussed process of [facility] following up with this request and need to obtain an order from patient's PCP Dr. Kriste Basque- informed daughter patient's information would be sent to [facility] who will follow up with PCS request- discussed with daughter difference between Palliative and Hospice services and she  voiced understanding; also informed I would try to see her when she returned to patient's room or leave information for her. Information will be sent to [facility] for PCS follow-up       Valente David, RN 11/03/2012, 1:03 PM [facility] Palliative Medicine Team RN Liaison [phone]

## 2012-11-03 NOTE — Progress Notes (Addendum)
CSW and MD met with pt and pt daughter at bedside to discuss disposition planning.   CSW updated pt daughter on bed offers for SNF.  Pt daughter is hesitant about transitioning pt to another location and would like for pt to return to [facility] with Home Health services, family support, possible private duty assistance, and palliative care services if palliative care services can follow at [facility]   CSW notified RNCM to assist with home health needs.   CSW contacted Valente David, [facility] liaison to inquire about palliative care services to follow at independent living facility. [facility] will notify this CSW if palliative is able to follow at independent living facility.  Addendum:  CSW received return phone call from Valente David, [facility] liaison stating that palliative care services will be able to follow pt at independent living facility. CSW notified pt and pt daughter at bedside.  No further social work needs identified at this time.  CSW signing off.   Jacklynn Lewis, MSW, LCSWA  Clinical Social Work [phone]

## 2012-11-03 NOTE — Discharge Summary (Signed)
Physician Discharge Summary  Margaret [name]:[id number] DOB: November 04, 1923 DOA: 10/28/2012  PCP: Michele Mcalpine, MD  Admit date: 10/28/2012 Discharge date: 11/04/2012  Time spent:  Recommendations for Outpatient Follow-up:  1. Home health- palliative care to follow as well 2. CBC, BMP 1 week  Discharge Diagnoses:  Principal Problem:   Aspiration pneumonia Active Problems:   HYPERCHOLESTEROLEMIA   ANEMIA   HYPERTENSION   CEREBROVASCULAR DISEASE   VENOUS INSUFFICIENCY   CHRONIC OBSTRUCTIVE ASTHMA UNSPECIFIED   DEGENERATIVE DISC DISEASE, CERVICAL SPINE   Hyponatremia   Chronic diastolic CHF (congestive heart failure)   Discharge Condition: improved  Diet recommendation: DYS with fluid restriction 1.5L  Filed Weights   10/29/12 0106  Weight: 52 kg (114 lb 10.2 oz)    History of present illness:  Patient is an 79 year old female with past medical history most significant for multiple medical problems like hypertension, recent stroke, venous insufficiency, hypercholesterolemia, reflux disease, constipation, history of osteoarthrosis, and anemia who resides in the independent senior citizen housing comes in today with progressive shortness of breath since last one week. Although patient notes that she has been having shortness of breath for last 1 week the daughters visit the patient every day say that patient was absolutely all right on Sunday when she attended a baby shower for her grandniece. They got really concerned chest last night that his 1 day prior to admission when patient had several episodes of vomiting which contained food particles and loose bowel motions. Patient did not have any fever, chills. She does complain of abdominal pain in the lower abdomen for last 2 days it is associated with difficulty in urination with burning.  In the ER initial workup was suggestive of high WBC count with left shift, positive leukocytes and WBCs in urine analysis, and chest x-ray  suggestive of right mid and lower zone pneumonia. Medical service was called to admit the patient   Hospital Course:  SOB:  chest x-ray is consistent with pneumonia. Given that patient had episodes of vomiting this is most consistent with aspiration pneumonia. De-escalate IV abx to one- levquin day #6/8 -Blood cultures - neg  -Sputum cultures - neg  -Urine for Legionella and strep- negative  - speech eval shows high risk of aspiration  Hyponatremia: This is something chronic and patient has had in the past with values as low as 114. Improved, appears to be component of dehydration and SIADH. Improved  Dysphagia-  MBS showed Severe cervical esophageal phase dysphagia; Moderate pharyngeal phase dysphagia;Mild oral phase dysphagia- discussed poor prognosis with family.  -palliative care consult  - c/o sore throat today- add diflucan but certainly worrisome for pill esophagitis will eliminate as many PO medications as I can   Staph coag neg UTI- s/p vanc x 5 days- d/c foley   Leukocytosis- improved   Hypokalemia- repleat   Severe malnutrition in the context of chronic illness   Procedures:  MBS  Consultations:  Speech  palliative care  Discharge Exam: Filed Vitals:   11/02/12 2228 11/02/12 2327 11/03/12 0609 11/03/12 1317  BP: 186/86 154/60 146/65 144/60  Pulse: 118 97 93 82  Temp: 98 F (36.7 C)  98.3 F (36.8 C) 98.1 F (36.7 C)  TempSrc: Oral  Oral Oral  Resp: 16 24 16 17   Height:      Weight:      SpO2: 96% 96% 97% 96%    General: A+Ox3, NAD Cardiovascular: rrr Respiratory: coarse  Discharge Instructions      Discharge  Orders   Future Appointments Provider Department Dept Phone   11/19/2012 4:00 PM Michele Mcalpine, MD [facility] [phone]   Future Orders Complete By Expires     Discharge instructions  As directed     Comments:      Home health PT/OT/SPH/social/aid CBC. BMP in 2 weeks Fluid restrict 1.5L DYS 3 diet Palliative care to  follow at [facility] (family to arrange private care)    Increase activity slowly  As directed         Medication List    STOP taking these medications       mineral oil liquid     vitamin D (CHOLECALCIFEROL) 400 UNITS tablet      TAKE these medications       ALPRAZolam 0.25 MG tablet  Commonly known as:  XANAX  Take 0.125 mg by mouth every 6 (six) hours as needed. For anxiety     aspirin EC 325 MG tablet  Take 1 tablet (325 mg total) by mouth daily.     azithromycin 1 % ophthalmic solution  Commonly known as:  AZASITE  Place 1 drop into both eyes every evening.     cyanocobalamin 1000 MCG tablet  Take 1,000 mcg by mouth every morning.     feeding supplement Liqd  Take 237 mLs by mouth daily.     fluconazole 100 MG tablet  Commonly known as:  DIFLUCAN  Take 1 tablet (100 mg total) by mouth daily.     lansoprazole 30 MG capsule  Commonly known as:  PREVACID  Take 30 mg by mouth every morning.     levofloxacin 750 MG tablet  Commonly known as:  LEVAQUIN  Take 1 tablet (750 mg total) by mouth every other day.     levothyroxine 75 MCG tablet  Commonly known as:  SYNTHROID, LEVOTHROID  Take 75 mcg by mouth every morning.     losartan 50 MG tablet  Commonly known as:  COZAAR  Take 1 tablet (50 mg total) by mouth daily.     methylcellulose 1 % ophthalmic solution  Commonly known as:  ARTIFICIAL TEARS  Place 1 drop into both eyes as needed.     pindolol 5 MG tablet  Commonly known as:  VISKEN  Take 5 mg by mouth 2 (two) times daily.     polyethylene glycol powder powder  Commonly known as:  GLYCOLAX/MIRALAX  Take 17 g by mouth daily.     senna-docusate 8.6-50 MG per tablet  Commonly known as:  Senokot-S  Take 1 tablet by mouth 2 (two) times daily.     simvastatin 20 MG tablet  Commonly known as:  ZOCOR  Take 1 tablet (20 mg total) by mouth daily at 6 PM.     sodium phosphate 7-19 GM/118ML Enem  Place 1 enema rectally daily as needed.          The  results of significant diagnostics from this hospitalization (including imaging, microbiology, ancillary and laboratory) are listed below for reference.    Significant Diagnostic Studies: Dg Chest 2 View  10/28/2012  *RADIOLOGY REPORT*  Clinical Data: Shortness of breath, cough and weakness.  CHEST - 2 VIEW  Comparison: 08/10/2012.  Findings: Normal sized heart.  Interval mild patchy opacity in the right mid and lower lung zones.  Stable mild prominence of the interstitial markings and clear left lung.  Stable cervical spine fixation hardware.  The bones appear osteopenic.  IMPRESSION:  1.  Right mid and lower lung  zone pneumonia. 2.  Stable mild chronic interstitial lung disease.   Original Report Authenticated By: Beckie Salts, M.D.    Dg Chest Port 1 View  10/30/2012  *RADIOLOGY REPORT*  Clinical Data: Chest pain  PORTABLE CHEST - 1 VIEW  Comparison: 10/28/2012  Findings: Normal heart size.  Normal pulmonary vascularity. Emphysematous changes and diffusely scattered fibrosis in the lungs.  Peribronchial thickening suggesting chronic bronchitis. There is slight nodular infiltration in the lung bases suggesting airspace disease with progression since previous study.  Pneumonia is suspected.  No blunting of costophrenic angles.  No pneumothorax.  Mediastinal contours appear intact.  Calcification of the aorta.  Surgical changes in the cervical spine.  IMPRESSION: Increasing airspace disease in the lung bases suggest pneumonia. Chronic emphysematous changes, fibrosis, and bronchitic changes in the lungs.   Original Report Authenticated By: Burman Nieves, M.D.    Dg Swallowing Func-speech Pathology  10/29/2012  Chales Abrahams, CCC-SLP     10/29/2012  2:48 PM Objective Swallowing Evaluation: Modified Barium Swallowing Study   Patient Details  Name: Margaret [name] MRN: [id number] Date of Birth: 12-19-1923  Today's Date: 10/29/2012 Time: 1210-1246 SLP Time Calculation (min): 36 min  Past Medical History:   Past Medical History  Diagnosis Date  . Unspecified sinusitis (chronic)   . Unspecified essential hypertension   . Cerebrovascular disease, unspecified   . Unspecified venous (peripheral) insufficiency   . Pure hypercholesterolemia   . Unspecified disorder of thyroid   . Esophageal reflux   . Unspecified constipation   . Urinary tract infection, site not specified   . Osteoarthrosis, unspecified whether generalized or localized,  unspecified site   . Degeneration of cervical intervertebral disc   . Closed fracture of other bone of wrist   . Anxiety state, unspecified   . Idiopathic urticaria   . Anemia, unspecified   . Other B-complex deficiencies   . Herpes zoster without mention of complication   . Stroke    Past Surgical History:  Past Surgical History  Procedure Laterality Date  . Tonsillectomy    . Total abdominal hysterectomy w/ bilateral salpingoophorectomy     . Cervical fusion    . Cataract extraction    . Nasal sinus surgery    . Esophagogastroduodenoscopy  09/10/2011    Procedure: ESOPHAGOGASTRODUODENOSCOPY (EGD);  Surgeon: Petra Kuba, MD;  Location: [facility] ENDOSCOPY;  Service: Endoscopy;   Laterality: N/A;  c-arm needed  . Savory dilation  09/10/2011    Procedure: SAVORY DILATION;  Surgeon: Petra Kuba, MD;   Location: [facility] ENDOSCOPY;  Service: Endoscopy;  Laterality: N/A;  . Esophagogastroduodenoscopy  05/13/2012    Procedure: ESOPHAGOGASTRODUODENOSCOPY (EGD);  Surgeon: Petra Kuba, MD;  Location: [facility] ENDOSCOPY;  Service: Endoscopy;   Laterality: N/A;  with carm  . Savory dilation  05/13/2012    Procedure: SAVORY DILATION;  Surgeon: Petra Kuba, MD;   Location: [facility] ENDOSCOPY;  Service: Endoscopy;  Laterality: N/A;   HPI:  79 yo female adm [facility] with asp pna- episode of vomiting of food  particles and loose bowels.   Pt has a complex med hx including  HTN, recent CVA, arthritis, anemia, thyroid d/o, anxiety, and  reflux.  Pt PSH + for multiple dilatations of esophagus - last  being 05/13/2012.  DG esophagus  04/25/2012 moderate to severe delay  in contrast- decreased motility - took 20 minutes for esophagus  to clear.  CXR 10/28/12 showed right mid and lower lobe pna.  Pt  admits to dysphagia for years for which she compensates by taking  small bites/sips, chewing well, etc.  She denies dysphagia  worsening.  She also admits to sore throat and several "colds" in  the last few years.          Assessment / Plan / Recommendation Clinical Impression  Dysphagia Diagnosis: Severe cervical esophageal phase  dysphagia;Moderate pharyngeal phase dysphagia;Mild oral phase  dysphagia   Clinical impression: Pt presents with mild oral, moderate  pharyngeal and severe cervical esophageal dysphagia resulting in  gross pharyngeal stasis WITHOUT pt awareness/sensation.  Suspect  her dysphagia is multifactorial - due to known h/o CVA impacting  oropharyngeal swallow and known h/o significant esophageal  deficit.  Although pt did not overtly aspirate, she is at very  high risk due to severity of stasis and poor ability to clear-  even with dry swallows.  Pt required 5-6 swallows to decreased  pyriform/cp residual of small bolus of thin liquid to mild amount  - mixing with secretions.  UES opens only minimally and suspected  decr laryngeal elevation/closure contributes to this issue as  well as ACDF hardware at C6-C7 impinging mildly.  Initial  swallows of pudding and cracker resulted in no movement of  consistencies into esophagus from pharynx- - nearly absent  muscular contraction, UES opening noted.  100% of pudding/cracker  bolus lodged in pharynx at vallecular/pyriform/cp space without  pt sensation.  Swallows of thin aided clearance of solids but  resulted in stasis of liquids again with sensory deficits.   Esophageal clearance also appeared compromised, pt has known h/o  motility issues.    Suspect pt has been aspirating chronically with tolerance until  this medical event.  Unfortuantely due to pharyngeal sensorimotor  deficits and  chronic nature of dysphagia,  prognosis for safe  efficient swallow is poor.   Aspiration risk will be ongoing and  exacerbated with acute medical illnesses with decreased tolerance  due to advanced age and weakness.    Suspect pt's dysphagia may contribute to her weight loss.   If MD  desires to pt to continue po with known risk, thinner liquids  will be best tolerated if aspirated and will decrease amount of  pharyngeal stasis.  Soft foods that pt can masticate very  thoroughly may provide pt with QOL.    SLP educated pt to findings, recommendations during testing but  pt will benefit from reinforcement of information.  SLP to follow  for education of pt, family to mitigation of  aspiration/dysphagia.  Thanks for this referral.        Treatment Recommendation  Therapy as outlined in treatment plan below    Diet Recommendation NPO;Dysphagia 3 (Mechanical Soft);Thin liquid  (npo or dys3/GROUND MEATS/thin with risks)   Liquid Administration via: Cup;Straw Medication Administration: Crushed with puree Supervision: Patient able to self feed Compensations: Slow rate;Small sips/bites;Follow solids with  liquid;Multiple dry swallows after each bite/sip Postural Changes and/or Swallow Maneuvers: Seated upright 90  degrees;Upright 30-60 min after meal    Other  Recommendations Recommended Consults: MBS Oral Care Recommendations: Oral care QID   Follow Up Recommendations  None    Frequency and Duration min 2x/week  2 weeks   Pertinent Vitals/Pain Congested cough, afebrile    SLP Swallow Goals Patient will utilize recommended strategies during swallow to  increase swallowing safety with: Moderate assistance   General HPI: 79 yo female adm to [facility] with asp pna- episode of  vomiting of food particles and loose bowels.  Pt has a complex  med hx including HTN, recent CVA, arthritis, anemia, thyroid d/o,  anxiety, and reflux.  Pt PSH + for multiple dilatations of  esophagus - last being 05/13/2012.  DG esophagus 04/25/2012  moderate  to severe delay in contrast- decreased motility - took  20 minutes for esophagus to clear.  CXR 10/28/12 showed right mid  and lower lobe pna.  Pt admits to dysphagia for years for which  she compensates by taking small bites/sips, chewing well, etc.   She denies dysphagia worsening.  She also admits to sore throat  and several "colds" in the last few years.      Type of Study: Modified Barium Swallowing Study Reason for Referral: Objectively evaluate swallowing function Diet Prior to this Study: Regular;Honey-thick liquids Temperature Spikes Noted: No Respiratory Status: Supplemental O2 delivered via (comment) History of Recent Intubation: No Behavior/Cognition: Alert;Cooperative;Hard of hearing;Pleasant  mood Oral Cavity - Dentition: Adequate natural dentition Oral Motor / Sensory Function: Impaired - see Bedside swallow  eval Self-Feeding Abilities: Able to feed self Patient Positioning: Upright in chair Baseline Vocal Quality: Hoarse;Low vocal intensity Volitional Cough: Weak;Congested Volitional Swallow: Able to elicit Anatomy: Within functional limits (cervical hardware noted at  C6-C7) Pharyngeal Secretions: Not observed secondary MBS    Reason for Referral Objectively evaluate swallowing function   Oral Phase Oral Preparation/Oral Phase Oral Phase: WFL Oral - Nectar Oral - Nectar Cup: Reduced posterior propulsion (decreased tongue  baser retraction) Oral - Thin Oral - Thin Teaspoon: Reduced posterior propulsion Oral - Thin Cup: Reduced posterior propulsion Oral - Thin Straw: Reduced posterior propulsion Oral - Solids Oral - Puree: Reduced posterior propulsion Oral - Regular: Reduced posterior propulsion Oral Phase - Comment Oral Phase - Comment: decreased tongue base retraction noted,  suspect decr intraoral pressure to help propel barium through  esophagus   Pharyngeal Phase Pharyngeal Phase Pharyngeal Phase: Impaired Pharyngeal - Nectar Pharyngeal - Nectar Cup: Pharyngeal residue - pyriform  sinuses;Reduced  anterior laryngeal mobility;Reduced epiglottic  inversion;Reduced pharyngeal peristalsis;Reduced laryngeal  elevation;Reduced airway/laryngeal closure;Reduced tongue base  retraction;Pharyngeal residue - cp segment Pharyngeal - Thin Pharyngeal - Thin Teaspoon: Reduced pharyngeal  peristalsis;Reduced epiglottic inversion;Reduced anterior  laryngeal mobility;Reduced laryngeal elevation;Reduced  airway/laryngeal closure;Reduced tongue base  retraction;Pharyngeal residue - cp segment;Pharyngeal residue -  pyriform sinuses Pharyngeal - Thin Cup: Penetration/Aspiration during  swallow;Reduced pharyngeal peristalsis;Reduced epiglottic  inversion;Reduced anterior laryngeal mobility;Reduced  airway/laryngeal closure;Reduced tongue base  retraction;Pharyngeal residue - cp segment;Pharyngeal residue -  pyriform sinuses Penetration/Aspiration details (thin cup): Material enters  airway, remains ABOVE vocal cords and not ejected out Pharyngeal - Thin Straw: Reduced airway/laryngeal closure;Reduced  laryngeal elevation;Reduced pharyngeal peristalsis;Reduced  epiglottic inversion;Reduced anterior laryngeal mobility;Reduced  tongue base retraction;Pharyngeal residue - cp segment;Pharyngeal  residue - pyriform sinuses Pharyngeal - Solids Pharyngeal - Puree: Pharyngeal residue - pyriform  sinuses;Pharyngeal residue - cp segment;Reduced laryngeal  elevation;Reduced airway/laryngeal closure;Reduced tongue base  retraction;Penetration/Aspiration before swallow Pharyngeal - Regular: Reduced tongue base retraction;Reduced  airway/laryngeal closure;Reduced laryngeal elevation;Pharyngeal  residue - cp segment;Pharyngeal residue - pyriform sinuses Pharyngeal Phase - Comment Pharyngeal Comment: pt with essentially absent swallow/muscular  contraction at times - multifactorial, gross pyriform/cp stasis  across consistencies with very poor UES opening - dry swallows  aided clearance but pt had to swallow 4-5 times to clear small  single cup  boluses, head turn right or left and chin tuck not  helpful to decr stasis  Cervical Esophageal Phase    GO    Cervical Esophageal Phase Cervical Esophageal  Phase: Impaired Cervical Esophageal Phase - Nectar Nectar Cup: Reduced cricopharyngeal relaxation Cervical Esophageal Phase - Thin Thin Teaspoon: Reduced cricopharyngeal relaxation Thin Cup: Reduced cricopharyngeal relaxation Thin Straw: Reduced cricopharyngeal relaxation Cervical Esophageal Phase - Solids Puree: Reduced cricopharyngeal relaxation Regular: Reduced cricopharyngeal relaxation Cervical Esophageal Phase - Comment Cervical Esophageal Comment: Very poor UES opening, with barium  filling pyriform sinus - it appears barium leaks into esophagus-  UES noted to open with pt belch better than during swallow, ?  appearance of CP bar          Donavan Burnet, MS [facility] SLP [phone]      Microbiology: Recent Results (from the past 240 hour(s))  URINE CULTURE     Status: None   Collection Time    10/28/12  9:25 PM      Result Value Range Status   Specimen Description URINE, CATHETERIZED   Final   Special Requests A   Final   Culture  Setup Time 10/29/2012 02:43   Final   Colony Count >=100,000 COLONIES/ML   Final   Culture     Final   Value: STAPHYLOCOCCUS SPECIES (COAGULASE NEGATIVE)     Note: RIFAMPIN AND GENTAMICIN SHOULD NOT BE USED AS SINGLE DRUGS FOR TREATMENT OF STAPH INFECTIONS.   Report Status 11/01/2012 FINAL   Final   Organism ID, Bacteria STAPHYLOCOCCUS SPECIES (COAGULASE NEGATIVE)   Final     Labs: Basic Metabolic Panel:  Recent Labs Lab 10/30/12 1440 10/30/12 2217 10/31/12 0400 11/01/12 0404 11/02/12 0408  NA 125* 124* 126* 126* 130*  K 4.2 3.5 3.4* 3.5 3.8  CL 91* 91* 92* 91* 93*  CO2 27 25 25 28 29   GLUCOSE 146* 100* 100* 105* 102*  BUN 14 11 8  4* 5*  CREATININE 0.59 0.52 0.53 0.58 0.56  CALCIUM 9.0 8.5 8.7 8.8 9.2   Liver Function Tests: No results found for this basename: AST, ALT, ALKPHOS, BILITOT, PROT,  ALBUMIN,  in the last 168 hours No results found for this basename: LIPASE, AMYLASE,  in the last 168 hours No results found for this basename: AMMONIA,  in the last 168 hours CBC:  Recent Labs Lab 10/28/12 2126 10/28/12 2138 10/29/12 0403 11/02/12 0408  WBC 14.4*  --  11.5* 3.6*  NEUTROABS 11.4*  --  8.4*  --   HGB 9.6* 8.8* 8.8* 8.0*  HCT 26.2* 26.0* 23.8* 22.0*  MCV 86.5  --  85.0 87.6  PLT 201  --  181 203   Cardiac Enzymes: No results found for this basename: CKTOTAL, CKMB, CKMBINDEX, TROPONINI,  in the last 168 hours BNP: BNP (last 3 results)  Recent Labs  08/14/12 1134  PROBNP 1779.0*   CBG: No results found for this basename: GLUCAP,  in the last 168 hours     Signed:  VANN, JESSICA  [facility] 11/04/2012, 1:32 PM

## 2012-11-04 ENCOUNTER — Telehealth: Payer: Self-pay | Admitting: Pulmonary Disease

## 2012-11-04 NOTE — Telephone Encounter (Signed)
Spoke with Verlon Au with [facility]. Patient was admitted to hospital 10/28/12 and discharged 11/04/12 with PNA and UTI. Patient has been ordered PT?OT, Home Health Aides, social work and a variety of other things. Patient referred to [facility] by [facility] and per Verlon Au they do not sign orders for the patient. Verlon Au is requesting Dr. Kriste Basque to sign orders for patient. Dr. Kriste Basque please advise, thank you.

## 2012-11-04 NOTE — Telephone Encounter (Signed)
Called and spoke with Margaret [name] at [facility] and she is aware that per SN---he will sign these orders if they fax them to our office.  Margaret [name] is aware that we have sent an order in yesterday for hospice so they will cont care until hospice admits the pt then they will discharge.  Nothing further is needed.

## 2012-11-11 NOTE — ED Provider Notes (Signed)
Medical screening examination/treatment/procedure(s) were conducted as a shared visit with non-physician practitioner(s) and myself.  I personally evaluated the patient during the encounter   Loren Racer, MD 11/11/12 [phone]

## 2012-11-13 ENCOUNTER — Telehealth: Payer: Self-pay | Admitting: Pulmonary Disease

## 2012-11-13 NOTE — Telephone Encounter (Signed)
Pallative care NP went to see the pt today. She is currently on Miralax for constipation but she isn't getting enough fluids. Wants to change her to Senokot S 1 po qhs.  Would you be okay with this change? Thanks.

## 2012-11-14 NOTE — Telephone Encounter (Signed)
Per SN---yes ok   rec for the senokot s 1-2 po qhs.  thanks

## 2012-11-14 NOTE — Telephone Encounter (Signed)
Spoke with Margaret [name]-- Margaret [name] aware of new order for senokot per SN. Nothing further needed.

## 2012-11-16 ENCOUNTER — Other Ambulatory Visit: Payer: Self-pay | Admitting: Pulmonary Disease

## 2012-11-19 ENCOUNTER — Other Ambulatory Visit (INDEPENDENT_AMBULATORY_CARE_PROVIDER_SITE_OTHER): Payer: Medicare Other

## 2012-11-19 ENCOUNTER — Ambulatory Visit (INDEPENDENT_AMBULATORY_CARE_PROVIDER_SITE_OTHER)
Admission: RE | Admit: 2012-11-19 | Discharge: 2012-11-19 | Disposition: A | Discharge status: Home / Self Care | Payer: Medicare Other | Source: Ambulatory Visit | Attending: Pulmonary Disease | Admitting: Pulmonary Disease

## 2012-11-19 ENCOUNTER — Encounter: Payer: Self-pay | Admitting: Pulmonary Disease

## 2012-11-19 ENCOUNTER — Ambulatory Visit (INDEPENDENT_AMBULATORY_CARE_PROVIDER_SITE_OTHER): Payer: Medicare Other | Admitting: Pulmonary Disease

## 2012-11-19 VITALS — BP 122/68 | HR 70 | Temp 98.3°F | Ht 66.0 in | Wt 109.8 lb

## 2012-11-19 DIAGNOSIS — E039 Hypothyroidism, unspecified: Secondary | ICD-10-CM

## 2012-11-19 DIAGNOSIS — I509 Heart failure, unspecified: Secondary | ICD-10-CM

## 2012-11-19 DIAGNOSIS — K5900 Constipation, unspecified: Secondary | ICD-10-CM

## 2012-11-19 DIAGNOSIS — K219 Gastro-esophageal reflux disease without esophagitis: Secondary | ICD-10-CM

## 2012-11-19 DIAGNOSIS — R1310 Dysphagia, unspecified: Secondary | ICD-10-CM

## 2012-11-19 DIAGNOSIS — I10 Essential (primary) hypertension: Secondary | ICD-10-CM

## 2012-11-19 DIAGNOSIS — D649 Anemia, unspecified: Secondary | ICD-10-CM

## 2012-11-19 DIAGNOSIS — M1990 Unspecified osteoarthritis, unspecified site: Secondary | ICD-10-CM

## 2012-11-19 DIAGNOSIS — I5032 Chronic diastolic (congestive) heart failure: Secondary | ICD-10-CM

## 2012-11-19 DIAGNOSIS — I679 Cerebrovascular disease, unspecified: Secondary | ICD-10-CM

## 2012-11-19 DIAGNOSIS — G20A1 Parkinson's disease without dyskinesia, without mention of fluctuations: Secondary | ICD-10-CM

## 2012-11-19 DIAGNOSIS — M5030 Other cervical disc degeneration, unspecified cervical region: Secondary | ICD-10-CM

## 2012-11-19 DIAGNOSIS — F0390 Unspecified dementia without behavioral disturbance: Secondary | ICD-10-CM

## 2012-11-19 DIAGNOSIS — G20 Parkinson's disease: Secondary | ICD-10-CM | POA: Insufficient documentation

## 2012-11-19 LAB — BASIC METABOLIC PANEL
BUN: 16 mg/dL (ref 6–23)
CO2: 29 mEq/L (ref 19–32)
Chloride: 87 mEq/L — ABNORMAL LOW (ref 96–112)
GFR: 72.08 mL/min (ref >60.00)
Glucose, Bld: 95 mg/dL (ref 70–99)
Potassium: 4.6 mEq/L (ref 3.5–5.1)
Sodium: 124 mEq/L — ABNORMAL LOW (ref 135–145)

## 2012-11-19 LAB — HEPATIC FUNCTION PANEL
ALT: 17 U/L (ref 0–35)
AST: 32 U/L (ref 0–37)
Albumin: 4.3 g/dL (ref 3.5–5.2)
Alkaline Phosphatase: 68 U/L (ref 39–117)
Total Protein: 7.7 g/dL (ref 6.0–8.3)

## 2012-11-19 LAB — CBC WITH DIFFERENTIAL/PLATELET
Basophils Absolute: 0 10*3/uL (ref 0.0–0.1)
HCT: 28.7 % — ABNORMAL LOW (ref 36.0–46.0)
Lymphs Abs: 3 10*3/uL (ref 0.7–4.0)
Monocytes Absolute: 0.5 10*3/uL (ref 0.1–1.0)
Monocytes Relative: 9.6 % (ref 3.0–12.0)
Neutrophils Relative %: 27.9 % — ABNORMAL LOW (ref 43.0–77.0)
Platelets: 253 10*3/uL (ref 150.0–400.0)
RDW: 16.2 % — ABNORMAL HIGH (ref 11.5–14.6)
WBC: 4.8 10*3/uL (ref 4.5–10.5)

## 2012-11-19 NOTE — Patient Instructions (Addendum)
Today we updated your med list in our EPIC system...    Continue your current medications the same...  Today we did your follow up CXR & lab work...    We will contact you w/ the results when available...   Call for any questions...  Let's plan a follow up visit in 53mo, sooner if needed for problems.Marland KitchenMarland Kitchen

## 2012-12-05 NOTE — Progress Notes (Signed)
Subjective:    Patient ID: Margaret [name], female    DOB: 1924-07-18, 79 y.o.   MRN: [id number]  HPI 79 y/o WF here for a follow up visit... She has Chr Sinusitis;  HBP;  ASPVD;  Chol;  GERD/ Constip w/ hx impaction;  UTIs;  DJD/ DDD;  Hx anxiety;  Hx anemia...   ~  September 05, 2011:  59mo ROV & post hosp> Adm 08/29/11 w/ adb pain, fecal impaction, worsening dysphagia, UTI, HBP & chronic hyponatremia==> improved after fluids, laxatives, antibiotics, etc; MBS showed dysphagia & she is on a liq diet==> she has f/u w/ DrMagod soon for EGD & dilatation...  See Prob List below>>  ~  October 09, 2011:  46mo ROV> she reports doing fine, having good BMs, & no new complaints or concerns today...   BP controlled on Cozaar & Visken, 142/64 today, denies CP/ palpit/ ch in SOB/ edema...  She had EGD w/ Savory dilatation from her gastroenterologist [facility] 09/10/11> essentially neg EGD (sm HH, few tiny gastric polyps) & dilated w/o difficulty; states swallowing is improved, OK now...  Denies constip now on good regimen w/ Miralax daily & Dulcolax tabs/ suppos as needed...  Other problems as noted>>  ~  December 10, 2011:  59mo ROV & f/u from ER 2/13> seen in ER w/ abd pain, loose stool & N/V;  Exam was neg w/o abd tenderness or distention but rectal showed large hard stool burden in rectum;  Labs showed Hg=10.3, Na=125, UA was +UTI;  XRay showed mod stool burden in colon;  She was given dose of Rocephin, suppository, asked to incr Miralax to Bid...  I note that for all her trouble w/ chronic constipation she just doesn't get it & won't take regular dosing of Miralax, Senakot-S, dulcolax etc; she could not afford & would not fill the Amitiza Rx or Linzess...  See prob list below>>    She reports "everything is OK now" & when pressed she admitted to not taking regular meds for her chr constipation> advised to restart MIRALAX Daily Qam & SENAKOT-S Qhs... She tells me she fell at church several weeks ago, no serious injury &  didn't go to the ER but this launched [place] into a discussion about her status>> Lives alone in her own home, does some housework, takes her own meds ?OK, daughter visits 2-3x per week, son lives in [place], son in law does her yard work, sister drives her to appts, she says she is safe & does ok at home, she has no intention of looking into AL etc...  ~  March 10, 2012:  46mo ROV & Margaret [name] has a number of chronic complaints> feels weak, not able to walk far, food doesn't taste good, some minor bruising noted, etc; states "I'm doing pretty good on my own at home" & she refuses to make other plans for the future...     As noted she has had a signif prob w/ constipation & XRays have consistently shown large stool burden; she seems incapable of self monitoring BMs/ constip/ etc; she has only been taking 1 tablesp of Miralax daily & once again asked to take 1 capful of Miralax daily along w/ 1-2 Senakot-S at bedtime!!!    We reviewed prob list, meds, xrays and labs> see below for updates >>  ~  July 21, 2012:  67mo ROV & post hosp check> presents from [facility] w/o med list today for post [facility] check, states she is trying to get into AL  or independ living facility...    Hosp 9/4 - 04/28/12 by [facility] w/ "I was sick" per pt- chr constip, chr hyponatremia, HBP- she was c/o n/v & abd pain- assoc w/ recurrent constip, her gastroenterologist is DrMagod & he has her on Miralax alone for her slow transit (although we have had her on more meds & she still gets constipated); also had evid proctitis on CT Abd & treated w/ Cipro/ Flagyl in hosp; also had patulous esoph w/ fluid & debris present- DrMagod did EGD/ dilatation 10/13 showing smHH w/ short segm Barrett's, poor esoph motility, mult sm gastric polyps, s/p savory dilatation;  She also had prob asp pneumonia & saw DrHatcher for ID- they considered MAI but signs more c/w chr asp due to her esoph prob;  They also had the palliative care team involved- she is a DNR,  declines feeding tubes, they were to f/u as out pt...     As noted she remains in [facility] at present, attended by DrSouth> we have called for her current med list >> We reviewed the following medical problems during today's office visit >>     Chronic sinus problems> followed by DrShoemaker- hx nasal septal perf, on Saline, Mucinex, Fluids...    Chronic Lung Dis> Bronchiectasis, recurrent asp pneumonia, ?MAI?> prev Mucinex was stopped in the NH, no pos resp cultures in EPIC...    HBP> on Cozaar50, Pindolol5Bid; BP= 150/80 & she denies CP, palpit, ch in SOB, edema, etc...    Cerebrovasc Dis> prev on ASA81; she denies cerebral ischemic symptoms...    Ven Insuffic> she knows to avoid sodium, elev legs, wear support hose when needed...    CHOL> she declines any chol meds; last FLP 7/12 was ?on Cres20- TChol 163, TG 91, HDL 72, LDL 73    Hypothy> on Synth75, last TSH 9/13 = 0.51    GI- GERD, esoph dysmotility w/ chr asp, ?Barretts, severe chr constipation> Prevacid30/d, pills crushed in applesauce, Miralax 17gm/d, Senakot-S Bid; her GI issues are managed by DrMagod et al...    Hx recurrent UTIs> Urine cult at [facility] grew Citrobacter- sens to Cipro, treated...    DJD, DDD in Cspine> on VitD 50K weekly, Tylenol prn...    Anxiety> not currently on anxiolytic Rx...    Hx Urticaria> the etiology was never delineated, but hasn't been recurrent either...    Anemia, B12 Defic> on VitB121000 mcg/d; last B12 level was 1/13 = 635... We reviewed prob list, meds, xrays and labs> see below for updates >>   ~  September 03, 2012:  6wk ROV & post hospital check > Margaret [name] was again admitted 12/28 - 08/19/12 by [facility] w/ episode confusion, ER eval revealed hyponatremia (121), UTI (pan-sens Klebs), & stroke w/ an acute right occipital infarct on MRI (likely due to her small vessel dis); her sodium improved w/ IVF, her aspirin was increased to 325mg /d, & they convinced her to go back on Statin rx w/ Simva20; she had  in-patient therapies & was disch to skilled nursing but family declined & took her home w/ home health=> now in new apt at the [facility]...     Chronic sinus problems> followed by DrShoemaker- hx nasal septal perf, on Saline, Mucinex, Fluids...    Chronic Lung Dis> Bronchiectasis, recurrent asp pneumonia, ?MAI?> prev Mucinex was stopped in the NH, no pos resp cultures in EPIC...    HBP> on Lisinopril2.5, Pindolol5Bid; BP= 180/84 & she denies CP, palpit, ch in SOB, edema; we decided to switch the Lisin to  LOSARTAN 50mg /d...    Cerebrovasc Dis> on ASA325; she denies recurrent cerebral ischemic symptoms...    Ven Insuffic> she knows to avoid sodium, elev legs, wear support hose when needed...    CHOL> now on Simva20 since 1/14 hosp; last FLP12/13 in hosp showed TChol 200, TG 76, HDL 78, LDL 107=> Neuro started the Simva20.    Hypothy> on Synth75, last TSH 12/13 = 1.54    GI- GERD, esoph dysmotility w/ chr asp, ?Barretts, severe chr constipation> Prevacid30/d, pills crushed in applesauce, Miralax 17gm/d, Senakot-S Bid & Mineral Oil prn; her GI issues are managed by DrMagod et al...    Hx recurrent UTIs> Urine cult at [facility] grew Citrobacter- sens to Cipro; UTI on adm 12/13=> pan-sens Klebs...    DJD, DDD in Cspine> on VitD 50K weekly, Tylenol prn...    Anxiety> on Xanax 0.25mg  prn since 1/14 hosp...    Hx Urticaria> the etiology was never delineated, but hasn't been recurrent either...    Anemia, B12 Defic> on VitB121000 mcg/d; last B12 level was 1/13 = 635... We reviewed prob list, meds, xrays and labs> see below for updates >>   ~  November 19, 2012:  2-60mo ROV & yet another post hosp check> she was hosp 3/18 - 11/04/12 by [facility] after n/v and prob aspiration- incr SOB & right mid/ lower zone opac on CXR; cults were neg, she had speech path eval & felt to be high risk for asp w/ severe cerv esoph phase dysphagia etc & she declined PEG tube- prognosis was felt to be poor- they were encouraged to consider  palliative care consult=> seen by DrMTaylor & her very helpful note is reviewed (they have still not made any changes, still at the [facility] in her apt)... She has [place] home health- PT OT Speech path, despite this help daugh notes that she is weaker, esp late in the day; they are not yet ready to force the issue of AL placement... We reviewed the following medical problems/ meds during today's office visit >>      Chronic sinus problems> followed by DrShoemaker- hx nasal septal perf, on Saline, Mucinex, Fluids...    Chronic Lung Dis> Bronchiectasis, recurrent asp pneumonia, ?MAI?> prev Mucinex was stopped in the NH, no pos resp cultures in EPIC; hosp 3/14 w/ asp pneumonia & responded to Rx...    HBP> on Pindolol5Bid, Losartan50; BP= 122/68 & she denies CP, palpit, ch in SOB, edema; but she is weak & chronically debilitated...    Cerebrovasc Dis> on ASA325; she denies recurrent cerebral ischemic symptoms...    CHOL> now off Simva20 again; last FLP12/13 in hosp showed TChol 200, TG 76, HDL 78, LDL 107=> Neuro started the Simva20 but she won't stick w/ it...    Hypothy> on Synth75, last TSH 12/13 = 1.54    GI- GERD, esoph dysmotility w/ chr asp, ?Barretts, severe chr constipation> Prevacid30/d, pills crushed in applesauce, Miralax 17gm/d, Senakot-S Bid & Mineral Oil prn; her GI issues are managed by DrMagod et al...    Hx recurrent UTIs> Urine cult at [facility] grew Citrobacter- sens to Cipro; UTI on adm 12/13=> pan-sens Klebs; UTI on adm 3/14=> sens Staph, Rx'd...    DJD, DDD in Cspine> off VitD 50K, using Tylenol prn...    Anxiety> on Xanax 0.25mg  prn...    Anemia, B12 Defic> on VitB121000 mcg/d; last B12 level was 1/13 = 635...  We reviewed prob list, meds, xrays and labs> see below for updates >>  CXR 4/14 showed improvement w/ resolution  of the right base pneumonia, better aeration, etc; neck hardware, calcif Ao, etc w/o change... LABS 4/14:  Chems- Na=124, renal wnl, LFTs wnl, Alb=4.3;  CBC-  anemia w/ Hg= 9.8 (sl improved from hosp)... rec to restrict free water.            Problem List:      SINUSITIS, CHRONIC (ICD-473.9) - Long Hx chronic sinus problems - Eval & Rx by DrShoemaker w/ hx epistaxis, nasal septal erosion w/ prev "button";  and nasal polyps... she uses Saline, Mucinex, etc... she notes mild chr cough from the drainage- we reviewed chronic nasal regimen & rec MUCINEX DM 2Bid...  HYPERTENSION (ICD-401.9) >> currently controlled on LOSARTAN 50mg /d & PINDOLOL 5mg Bid... ~  Baseline EKG w/ NSR, WNL... ~  2D ECHO 2001 was normal... ~  CXR 11/07 w/ Biapical pl thickening, NAD... CXR 9/09 showed COPD w/ incr markings, NAD... f/u CXR 5/11 showed COPD, NAD...  ~  11/10: c/o weak in AM- decision to decr Amlodipine to 5mg /d & BP has been stable on this. ~  8/11: Hosp by [facility] w/ "presyncope" & meds were adjusted but she restarted prev Rx on discharge> we decided to stop Diovan/Hct in favor of LOSARTAN 50mg /d (?if she ever even filled it- reminded to bring all med bottles to every visit). ~  7/12: BP today= 120/70, no postural changes, ?on Amlodip5 & Losartan50... she denies HA, visual symptoms, CP, palpit, change in SOB, edema, etc... ~  10/12: Near syncope at [facility]> saw DrRoss w/ attempted meds changes (asked to stop Amlodip/Losartan & start Pindolol5Bid) but she decided to take all 3... ~  11/12: BP today= 132/70 w/ transient postural drop; asked to stop Amlodip, continue LOSARTAN50, take VISKEN5Bid... ~  1/13: BP= 118/72 on same meds & feeling better since disch... ~  2/13: BP= 142/64 on Losar50 & Pindolol5Bid, continue same; 1 view CXR 2/13 showed COPD, biapical scarring, NAD... ~  4/13: BP= 128/72 w/o postural changes; she denies CP, palpit, ch in DOE, edema... ~  7/13:  BP= 118/72 & she remains asymptomatic... ~  12/13:  CXR showed norm heart size, clear lungs, NAD;  EKG 12/13 showed NSR, rate87, 1st degree AVB, NAD... ~  12/13:  2DEcho showed norm LVF w/ EF=65-70%, Gr1DD,  mildly calcif MV leaflets w/o stenosis or regurg... ~  1/14: on Lisinopril2.5, Pindolol5Bid; BP= 180/84 & she denies CP, palpit, ch in SOB, edema; we decided to switch the Lisin to LOSARTAN 50mg /d.. ~  4/14:   CEREBROVASCULAR DISEASE (ICD-437.9) - on ASA 325mg /d... Prev eval for syncope (related to postural BP changes) w/ MRI showing small vessel dz;  she has bilat CBruits w/ Carotid Dopplers in 2001 showing mild plaque, no signif ICA stenoses... ~  she denies cerebral ischemic symptoms on the ASA 81mg /d...  ~  Adm 12/13-1/14 & MRI brain showed sm acute infarct in right occipital lobe, advanced microvasc ischemia; seen by Neuro & they incr her ASA to 325mg /d. ~  12/13: CDopplers showed mild to mod heterogeneous plaque on right w/ 0-39% ICA stenosis; only mild plaque on left & no stenosis, vertebrals were antegrade... ~  1/14: on ASA325; she denies recurrent cerebral ischemic symptoms...  VENOUS INSUFFICIENCY (ICD-459.81) - on low sodium diet + support hose as needed.  HYPERCHOLESTEROLEMIA (ICD-272.0) - prev on Simva40 but she thinks this made her weak & she stopped it;  Tried Crestor 20mg /d but ?if filling this med regularly or not, asked to bring all bottles to each visit... ~  FLP 12/08 showed  TChol 204, TG 93, HDL 67, LDL 106... On Simva40/d. ~  FLP 9/09 showed TChol 183, TG 53, HDL 68, LDL 105... Contin same + diet. ~  FLP 8/10 showed TChol 224, TG 78, HDL 67, LDL 137... rec> take med regularly & work on diet. ~  FLP 5/11 showed TChol 187, TG 80, HDL 67, LDL 104 ~  8/11: she thinks the Simva40 makes her weak & she wants to put it on HOLD. ~  FLP off meds 3/12 showed TChol 293, TG 90, HDL 81, LDL 191... rec to restart Cres20. ~  FLP 7/12 ?on Cres20 showed TChol 163, TG 91, HDL 72, LDL 73 ~  She has since stopped the Crestor on her own (?making her weak). ~  1/13 & 4/13: Pt on diet alone & doesn't want Chol meds; not fasting today for FLP... ~  FLP 12/13 in hosp showed TChol 200, TG 76, HDL  78, LDL 107=> Neuro started the Simva20=> she stopped on her own.  UNSPECIFIED DISORDER OF THYROID/ HYPOTHYROIDISM - she has a sl elevated TSH which we were following & finally started SYNTHROID 22mcg/d 3/12... ~  labs 9/09 showed TSH= 7.31 ~  labs 1/10 showed TSH= 5.40 ~  labs 8/10 showed TSH= 5.08... she does c/o being "cold" ~  labs 5/11 showed TSH= 6.11... she notes that her energy is "pretty fair" ~  labs 8/11 showed TSH= 0.73... wt= 116# & stable over the last 4mo. ~  Labs 3/12 showed TSH= 9.85... Rec> start SYNTHROID 41mcg/d. ~  Labs 7/12 on Levo75 showed TSH= 0.27... Keep same for now. ~  Labs 1/13 on Levo75 showed TSH= 4.68 ~  Labs 12/13 on Levo75 showed TSH= 1.54  GERD (ICD-530.81) - PPI changed to generic PREVACID 30mg /d... Known HH & GERD w/ EGD from [facility] 8/01 & 2/06 showing HH, GERD, erosions, & gastric polyp...  ~  EGD 12/08 w/ savory dilatation... she is encouraged to f/u w/ DrMagod if symptoms occur... ~  f/u EGD w/ dilatation 11/11 by [facility] for dysphagia symptoms showed tort esoph, sm HH, tiny gastric polyps, Savory dil done. ~  Yet another EGD & dilatation 1/13 by DrMagod; smHH, few tiny gastric polyps, Savory dilatation done... ~  Adm 3/14 w/ epis of asp pneumonia; seen by speech path w/ severe dysphagia- she refuses PEG etc...  CONSTIPATION (ICD-564.00) - Hx severe constipation w/ fecal impaction req hosp 2/03 & intermittently over the yrs... ~  colonoscopy 2/06 DrMagod w/ hemorrhoids & sm polyps... Encouraged to use MIRALAX daily... ~  [facility] 10/11 w/ constip & fecal impaction> disimpacted & started back on MIRALAX & SENAKOT-S... ~  CT Abd 10/11 showed norm GB, sm bilat renal cysts, prev hyst, rectal wall thickening c/w proctitis. ~  [facility] 1/13 w/ similar problem & we reviewed the importance of taking the Miralax & Senakot every day & monitoring her output w/ contingency meds (Mag citrate & Dulcolax) if needed... ~  2/13 OV: she reports improved w/ regular Miralax & prn  Dulcolax; asked to restart the SENAKOT-S Qhs as well...  UTI (ICD-599.0) - Hx chronic UTI's Rx by Urology Joneen Boers w/ Macrodantin in the past... ~  8/11: Hosp by [facility] w/ UTI- sens EColi treated w/ Rocephin/ then Septra. ~  2/13: UTI w/ final C&S growing Staph & Enterococcus both sens to Levaquin & Tcn> Rx Levaquin 500mg /d + Align. ~  She has had numerous UTIs in the interval- Hosp 1/14 w/ pan-sens Klebs & [facility] 3/14 w/ sens Staph...  DEGENERATIVE JOINT  DISEASE (ICD-715.90) - she uses Tylenol/ Advil as needed... ~  5/09:  she fell and broke her right wrist- s/p ORIF 6/09 by DrWeingold  DEGENERATIVE DISC DISEASE, CERVICAL SPINE (ICD-722.4) - CSpine surg by DrJenkins 1998... ~  12/13:  She is back on Vit D 50,000 u weekly...  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 ~  1/14: she was disch by [facility] on Lisinopril 2.5mg  & we will watch her closely for allergic reaction...  ANEMIA (ICD-285.9) >> she takes one IRON tab daily & B12 1030mcg/d... VITAMIN B12 DEFICIENCY (ICD-266.2) ~  labs 6/08 showed Hg= 10.5 ~  labs 12/08 showed Hg= 12.3, Fe= 92 ~  labs 9/09 showed Hg= 11.8, MCV=89 ~  labs 1/10 showed Hg= 12.1, MCV= 89, Fe= 83, B12= 186... started on B12 shots monthly. ~  labs 8/10 showed Hg= 12.2, MCV= 92, Fe= 87, B12= 470... continue Rx. ~  labs 5/11 showed Hg= 11.2, MCV= 88 ~  labs 8/11 in hosp showed Hg= 10-11 range; ?switched to Oral B12- 1081mcg/d... ~  labs 10/11 in hosp showed Hg= 10-11 range... asked to start Femiron one tab daily. ~  Labs 3/12 showed Hg= 12.0, MCV= 90 ~  Labs 7/12 showed Hg= 11.1, MCV= 89, Fe= 87 (22%sat) ~  Labs 1/13 showed Hg=10.3, MCV=90, Fe=46 (13%sat), B12=635, SPE=neg (no monoclonal spike). ~  Labs 1/14 showed Hg= 10-11 range ~  Labs 3/14 Hosp showed Hg~8-9 range... ~  Labs 4/14 showed Hg= 9.8  Hx SHINGLES - right T9-10 distrib 6/10 & treated w/ Famvir, Medrol,  DCN100...   Past Surgical History  Procedure Laterality Date  . Tonsillectomy    . Total abdominal hysterectomy w/ bilateral salpingoophorectomy    . Cervical fusion    . Cataract extraction    . Nasal sinus surgery    . Esophagogastroduodenoscopy  09/10/2011    Procedure: ESOPHAGOGASTRODUODENOSCOPY (EGD);  Surgeon: Petra Kuba, MD;  Location: [facility] ENDOSCOPY;  Service: Endoscopy;  Laterality: N/A;  c-arm needed  . Savory dilation  09/10/2011    Procedure: SAVORY DILATION;  Surgeon: Petra Kuba, MD;  Location: [facility] ENDOSCOPY;  Service: Endoscopy;  Laterality: N/A;  . Esophagogastroduodenoscopy  05/13/2012    Procedure: ESOPHAGOGASTRODUODENOSCOPY (EGD);  Surgeon: Petra Kuba, MD;  Location: [facility] ENDOSCOPY;  Service: Endoscopy;  Laterality: N/A;  with carm  . Savory dilation  05/13/2012    Procedure: SAVORY DILATION;  Surgeon: Petra Kuba, MD;  Location: [facility] ENDOSCOPY;  Service: Endoscopy;  Laterality: N/A;    Outpatient Encounter Prescriptions as of 11/19/2012  Medication Sig Dispense Refill  . ALPRAZolam (XANAX) 0.25 MG tablet Take 0.125 mg by mouth every 6 (six) hours as needed. For anxiety      . aspirin EC 325 MG tablet Take 1 tablet (325 mg total) by mouth daily.  30 tablet  0  . cyanocobalamin 1000 MCG tablet Take 1,000 mcg by mouth every morning.       . feeding supplement (ENSURE COMPLETE) LIQD Take 237 mLs by mouth daily.      . lansoprazole (PREVACID) 30 MG capsule Take 30 mg by mouth every morning.       Marland Kitchen levothyroxine (SYNTHROID, LEVOTHROID) 75 MCG tablet Take 75 mcg by mouth every morning.       Marland Kitchen losartan (COZAAR) 50 MG tablet Take 1 tablet (50 mg total) by mouth daily.  30 tablet  11  . methylcellulose (ARTIFICIAL TEARS)  1 % ophthalmic solution Place 1 drop into both eyes as needed.      . pindolol (VISKEN) 5 MG tablet Take 5 mg by mouth 2 (two) times daily.      . potassium chloride SA (K-DUR,KLOR-CON) 20 MEQ tablet TAKE 1 TABLET BY MOUTH EVERY DAY WITH FOOD  30 tablet  6  .  senna-docusate (SENOKOT-S) 8.6-50 MG per tablet Take 1-2 tablet by mouth at bedtime      . [DISCONTINUED] senna-docusate (SENOKOT-S) 8.6-50 MG per tablet Take 1 tablet by mouth 2 (two) times daily.  30 tablet  1  . [DISCONTINUED] azithromycin (AZASITE) 1 % ophthalmic solution Place 1 drop into both eyes every evening.       . [DISCONTINUED] fluconazole (DIFLUCAN) 100 MG tablet Take 1 tablet (100 mg total) by mouth daily.  12 tablet  0  . [DISCONTINUED] levofloxacin (LEVAQUIN) 750 MG tablet Take 1 tablet (750 mg total) by mouth every other day.  3 tablet  0  . [DISCONTINUED] polyethylene glycol powder (GLYCOLAX/MIRALAX) powder Take 17 g by mouth daily.      . [DISCONTINUED] simvastatin (ZOCOR) 20 MG tablet Take 1 tablet (20 mg total) by mouth daily at 6 PM.  30 tablet  0  . [DISCONTINUED] sodium phosphate (FLEET) 7-19 GM/118ML ENEM Place 1 enema rectally daily as needed.  5 enema  0   No facility-administered encounter medications on file as of 11/19/2012.    Allergies  Allergen Reactions  . Aspirin Hives    High Doses    Current Medications, Allergies, Past Medical History, Past Surgical History, Family History, and Social History were reviewed in [employer] record.    Review of Systems        See HPI - all other systems neg except as noted...  The patient complains of weight loss, decreased hearing, dyspnea on exertion, headaches, severe indigestion/heartburn, muscle weakness, and difficulty walking.  The patient denies anorexia, fever, weight gain, vision loss, hoarseness, chest pain, syncope, peripheral edema, prolonged cough, hemoptysis, abdominal pain, melena, hematochezia, hematuria, incontinence, suspicious skin lesions, transient blindness, depression, unusual weight change, abnormal bleeding, enlarged lymph nodes, and angioedema.     Objective:   Physical Exam     WD, WN, 79 y/o WF in NAD... she is chr ill appearing... wt 112# GENERAL:  Alert & oriented;  pleasant & cooperative... HEENT:  [place]/AT, EOM-wnl, PERRLA, EACs- sl wax, TMs-wnl, NOSE- nasal septal perf, THROAT- sl red +drainage... NECK:  Supple w/ decrROM; no JVD; normal carotid impulses w/ 1+bruits; no thyromegaly or nodules palpated; no lymphadenopathy. CHEST:  Clear to P & A; without wheezes or rales, but scat rhonchi heard... HEART:  Regular Rhythm;  gr1/6 SEM without rubs or gallops detected... ABDOMEN:  Soft & nontender; normal bowel sounds; no organomegaly or masses palpated... EXT: without deformities, mild arthritic changes; no varicose veins/ +venous insuffic/ no edema. NEURO:  CN's intact;  no focal neuro deficits, she is weak diffusely... DERM: scarring in the right T9-10 distrib of her prev shingles eruption...  RADIOLOGY DATA:  Reviewed in the EPIC EMR & discussed w/ the patient...  LABORATORY DATA:  Reviewed in the EPIC EMR & discussed w/ the patient...   Assessment & Plan:    Hx Chronic Sinusitis>  Long hx chr sinus problems and perforated nasal septum; rec to use Mucinex 1-2 Bid + Fluids and nasal saline mist...  Chr Lung dis- Bronchiectasis, Asp pneumonias, ?MAI?- not proven> most findings the result of her severe esoph problems &  recurrent asp=> she refuses PEG etc.  HBP>  BP meds adjusted freq but she has been non-compliant; pt asked to bring all med bottles to every visit; we decided upon LOSARTAN 50mg /d & PINDOLOL 5mg Bid==> BP is stable on this Rx.  Cerebrovasc Dis/ right occip stroke>  She denies any recurrent cerebral ischemic symptoms since her [facility] 1/14 w/ the stroke; no postural BP changes; continue ASA 325mg  daily...  Venous Insuffic>  Reminded to elim sodium, elevate legs, wear support hose...  CHOL>  She prev declined all Statin meds, but agreed to Simva20 after her 1/14 hosp w/ right occipital stroke but then stopped it on her own...  HYPOTHYROID>  On Synthroid 5mcg/d & labs are better- keep same for now...  GI> GERD, Dysphagia, Constip, recurrent  impactions, etc>  rec to take PPI/ Prevacid regularly, and continue Miralax/ Senakot-S etc; reminded to take laxative regimen every day!!!  She had repeat EGD by [facility] 1/13 & 10/13 w/ dilatations...  DJD, CSpine Disc dis, etc>  Stable on OTC Tylenol & occas Advil...  ANEMIA & B12 Defic>  She remains on Fe one tab daily & B12 orally as well...   Patient's Medications  New Prescriptions   No medications on file  Previous Medications   ALPRAZOLAM (XANAX) 0.25 MG TABLET    Take 0.125 mg by mouth every 6 (six) hours as needed. For anxiety   ASPIRIN EC 325 MG TABLET    Take 1 tablet (325 mg total) by mouth daily.   CYANOCOBALAMIN 1000 MCG TABLET    Take 1,000 mcg by mouth every morning.    FEEDING SUPPLEMENT (ENSURE COMPLETE) LIQD    Take 237 mLs by mouth daily.   LANSOPRAZOLE (PREVACID) 30 MG CAPSULE    Take 30 mg by mouth every morning.    LEVOTHYROXINE (SYNTHROID, LEVOTHROID) 75 MCG TABLET    Take 75 mcg by mouth every morning.    LOSARTAN (COZAAR) 50 MG TABLET    Take 1 tablet (50 mg total) by mouth daily.   METHYLCELLULOSE (ARTIFICIAL TEARS) 1 % OPHTHALMIC SOLUTION    Place 1 drop into both eyes as needed.   PINDOLOL (VISKEN) 5 MG TABLET    Take 5 mg by mouth 2 (two) times daily.   POTASSIUM CHLORIDE SA (K-DUR,KLOR-CON) 20 MEQ TABLET    TAKE 1 TABLET BY MOUTH EVERY DAY WITH FOOD  Modified Medications   Modified Medication Previous Medication   SENNA-DOCUSATE (SENOKOT-S) 8.6-50 MG PER TABLET senna-docusate (SENOKOT-S) 8.6-50 MG per tablet      Take 1-2 tablet by mouth at bedtime    Take 1 tablet by mouth 2 (two) times daily.  Discontinued Medications   AZITHROMYCIN (AZASITE) 1 % OPHTHALMIC SOLUTION    Place 1 drop into both eyes every evening.    FLUCONAZOLE (DIFLUCAN) 100 MG TABLET    Take 1 tablet (100 mg total) by mouth daily.   LEVOFLOXACIN (LEVAQUIN) 750 MG TABLET    Take 1 tablet (750 mg total) by mouth every other day.   POLYETHYLENE GLYCOL POWDER (GLYCOLAX/MIRALAX) POWDER    Take  17 g by mouth daily.   SIMVASTATIN (ZOCOR) 20 MG TABLET    Take 1 tablet (20 mg total) by mouth daily at 6 PM.   SODIUM PHOSPHATE (FLEET) 7-19 GM/118ML ENEM    Place 1 enema rectally daily as needed.

## 2012-12-09 ENCOUNTER — Telehealth: Payer: Self-pay | Admitting: Pulmonary Disease

## 2012-12-09 NOTE — Telephone Encounter (Signed)
Called and left a message with annie to let jan know that forms will be signed by SN and faxed back over.  Nothing further is needed.

## 2012-12-14 ENCOUNTER — Other Ambulatory Visit: Payer: Self-pay | Admitting: Pulmonary Disease

## 2012-12-14 ENCOUNTER — Other Ambulatory Visit: Payer: Self-pay | Admitting: Internal Medicine

## 2012-12-16 ENCOUNTER — Telehealth: Payer: Self-pay | Admitting: Pulmonary Disease

## 2012-12-16 NOTE — Telephone Encounter (Signed)
ATC Jan-- spoke with Marchia Meiers w [facility] to have Jan return call

## 2012-12-17 NOTE — Telephone Encounter (Signed)
I do not have a face to face document for this patient, just a plan of care from [place], and filling out now and giving to Dr Kriste Basque for signature.  Called [place] and spoke to [place] in ref to this; he will fax a face to face to [phone] pcc fax.  Kandice Hams

## 2012-12-17 NOTE — Telephone Encounter (Signed)
Spoke with Jan at [facility] She states that she has faxed over a face to face for SN several times Wants to know the status of this Please advise, thanks!

## 2012-12-18 NOTE — Telephone Encounter (Signed)
Will forward to Leigh to follow up on this form

## 2012-12-22 ENCOUNTER — Ambulatory Visit: Payer: Medicare Other | Admitting: Pulmonary Disease

## 2012-12-23 NOTE — Telephone Encounter (Signed)
Leigh, have you received this form?  Thanks!

## 2012-12-30 NOTE — Telephone Encounter (Signed)
Spoke with Jan at [facility] and she states that face to face was received but Homebound status needs to be more detailed.  Reprinted scanned form and placed in SN DME folder.  Will need to refax to [facility] when complete.

## 2012-12-31 NOTE — Telephone Encounter (Signed)
Form has been completed by SN and sent up front with folder to be faxed.

## 2013-01-15 ENCOUNTER — Other Ambulatory Visit: Payer: Self-pay | Admitting: Internal Medicine

## 2013-01-26 ENCOUNTER — Ambulatory Visit: Payer: Medicare Other | Admitting: Pulmonary Disease

## 2013-02-10 ENCOUNTER — Telehealth: Payer: Self-pay | Admitting: Pulmonary Disease

## 2013-02-10 MED ORDER — LEVOFLOXACIN 500 MG PO TABS: 500.0000 mg | ORAL_TABLET | Freq: Every day | ORAL | Status: DC

## 2013-02-10 NOTE — Telephone Encounter (Signed)
Called, spoke with Malachi Bonds.  Reports pt is having dysuria, burning with urination, not feeling well, and is lethargic.  Symptoms started yesterday.  Also report pt's had a fever but unsure what is has been.  Requesting something for UTI.  Dr. Kriste Basque, pls advise.  Thank you.  [facility]  Last OV with SN: 11/19/12; asked to f/u in 4 wks. No pending OV at this time  Allergies  Allergen Reactions  . Aspirin Hives    High Doses

## 2013-02-10 NOTE — Telephone Encounter (Signed)
Per SN---  Call in levaquin 500 mg  #7  1 daily until gone.  i have called and lmom for gloria johnson--pts daughter.  Advised her to call back for any questions or concerns.

## 2013-02-16 ENCOUNTER — Encounter (HOSPITAL_COMMUNITY): Payer: Self-pay | Admitting: Nurse Practitioner

## 2013-02-16 ENCOUNTER — Inpatient Hospital Stay (HOSPITAL_COMMUNITY)
Admission: EM | Admit: 2013-02-16 | Discharge: 2013-02-20 | DRG: 177 | Disposition: A | Discharge status: Home Health | Payer: Medicare Other | Attending: Internal Medicine | Admitting: Internal Medicine

## 2013-02-16 ENCOUNTER — Emergency Department (HOSPITAL_COMMUNITY): Payer: Medicare Other

## 2013-02-16 DIAGNOSIS — J189 Pneumonia, unspecified organism: Secondary | ICD-10-CM

## 2013-02-16 DIAGNOSIS — M5030 Other cervical disc degeneration, unspecified cervical region: Secondary | ICD-10-CM

## 2013-02-16 DIAGNOSIS — J449 Chronic obstructive pulmonary disease, unspecified: Secondary | ICD-10-CM

## 2013-02-16 DIAGNOSIS — I5032 Chronic diastolic (congestive) heart failure: Secondary | ICD-10-CM | POA: Diagnosis present

## 2013-02-16 DIAGNOSIS — K5900 Constipation, unspecified: Secondary | ICD-10-CM

## 2013-02-16 DIAGNOSIS — Z8673 Personal history of transient ischemic attack (TIA), and cerebral infarction without residual deficits: Secondary | ICD-10-CM

## 2013-02-16 DIAGNOSIS — G20A1 Parkinson's disease without dyskinesia, without mention of fluctuations: Secondary | ICD-10-CM | POA: Diagnosis present

## 2013-02-16 DIAGNOSIS — E079 Disorder of thyroid, unspecified: Secondary | ICD-10-CM

## 2013-02-16 DIAGNOSIS — J329 Chronic sinusitis, unspecified: Secondary | ICD-10-CM

## 2013-02-16 DIAGNOSIS — L501 Idiopathic urticaria: Secondary | ICD-10-CM

## 2013-02-16 DIAGNOSIS — D649 Anemia, unspecified: Secondary | ICD-10-CM | POA: Diagnosis present

## 2013-02-16 DIAGNOSIS — G20 Parkinson's disease: Secondary | ICD-10-CM | POA: Diagnosis present

## 2013-02-16 DIAGNOSIS — S62109A Fracture of unspecified carpal bone, unspecified wrist, initial encounter for closed fracture: Secondary | ICD-10-CM

## 2013-02-16 DIAGNOSIS — N390 Urinary tract infection, site not specified: Secondary | ICD-10-CM

## 2013-02-16 DIAGNOSIS — IMO0002 Reserved for concepts with insufficient information to code with codable children: Secondary | ICD-10-CM

## 2013-02-16 DIAGNOSIS — I872 Venous insufficiency (chronic) (peripheral): Secondary | ICD-10-CM

## 2013-02-16 DIAGNOSIS — E876 Hypokalemia: Secondary | ICD-10-CM

## 2013-02-16 DIAGNOSIS — R636 Underweight: Secondary | ICD-10-CM

## 2013-02-16 DIAGNOSIS — Z515 Encounter for palliative care: Secondary | ICD-10-CM

## 2013-02-16 DIAGNOSIS — N179 Acute kidney failure, unspecified: Secondary | ICD-10-CM

## 2013-02-16 DIAGNOSIS — B029 Zoster without complications: Secondary | ICD-10-CM

## 2013-02-16 DIAGNOSIS — Z66 Do not resuscitate: Secondary | ICD-10-CM | POA: Diagnosis present

## 2013-02-16 DIAGNOSIS — E43 Unspecified severe protein-calorie malnutrition: Secondary | ICD-10-CM | POA: Diagnosis present

## 2013-02-16 DIAGNOSIS — Z22322 Carrier or suspected carrier of Methicillin resistant Staphylococcus aureus: Secondary | ICD-10-CM

## 2013-02-16 DIAGNOSIS — R55 Syncope and collapse: Secondary | ICD-10-CM

## 2013-02-16 DIAGNOSIS — E538 Deficiency of other specified B group vitamins: Secondary | ICD-10-CM

## 2013-02-16 DIAGNOSIS — R64 Cachexia: Secondary | ICD-10-CM | POA: Diagnosis present

## 2013-02-16 DIAGNOSIS — F0390 Unspecified dementia without behavioral disturbance: Secondary | ICD-10-CM | POA: Diagnosis present

## 2013-02-16 DIAGNOSIS — Z79899 Other long term (current) drug therapy: Secondary | ICD-10-CM

## 2013-02-16 DIAGNOSIS — I6350 Cerebral infarction due to unspecified occlusion or stenosis of unspecified cerebral artery: Secondary | ICD-10-CM

## 2013-02-16 DIAGNOSIS — E039 Hypothyroidism, unspecified: Secondary | ICD-10-CM | POA: Diagnosis present

## 2013-02-16 DIAGNOSIS — R1310 Dysphagia, unspecified: Secondary | ICD-10-CM | POA: Diagnosis present

## 2013-02-16 DIAGNOSIS — J4489 Other specified chronic obstructive pulmonary disease: Secondary | ICD-10-CM

## 2013-02-16 DIAGNOSIS — F411 Generalized anxiety disorder: Secondary | ICD-10-CM

## 2013-02-16 DIAGNOSIS — K219 Gastro-esophageal reflux disease without esophagitis: Secondary | ICD-10-CM

## 2013-02-16 DIAGNOSIS — I679 Cerebrovascular disease, unspecified: Secondary | ICD-10-CM

## 2013-02-16 DIAGNOSIS — Z981 Arthrodesis status: Secondary | ICD-10-CM

## 2013-02-16 DIAGNOSIS — E7800 Pure hypercholesterolemia, unspecified: Secondary | ICD-10-CM

## 2013-02-16 DIAGNOSIS — Z7982 Long term (current) use of aspirin: Secondary | ICD-10-CM

## 2013-02-16 DIAGNOSIS — E871 Hypo-osmolality and hyponatremia: Secondary | ICD-10-CM | POA: Diagnosis present

## 2013-02-16 DIAGNOSIS — R410 Disorientation, unspecified: Secondary | ICD-10-CM

## 2013-02-16 DIAGNOSIS — K6289 Other specified diseases of anus and rectum: Secondary | ICD-10-CM

## 2013-02-16 DIAGNOSIS — M1990 Unspecified osteoarthritis, unspecified site: Secondary | ICD-10-CM

## 2013-02-16 DIAGNOSIS — I10 Essential (primary) hypertension: Secondary | ICD-10-CM | POA: Diagnosis present

## 2013-02-16 DIAGNOSIS — J690 Pneumonitis due to inhalation of food and vomit: Principal | ICD-10-CM

## 2013-02-16 DIAGNOSIS — I509 Heart failure, unspecified: Secondary | ICD-10-CM | POA: Diagnosis present

## 2013-02-16 DIAGNOSIS — I639 Cerebral infarction, unspecified: Secondary | ICD-10-CM

## 2013-02-16 LAB — STREP PNEUMONIAE URINARY ANTIGEN: Strep Pneumo Urinary Antigen: NEGATIVE

## 2013-02-16 LAB — CBC WITH DIFFERENTIAL/PLATELET
Basophils Relative: 0 % (ref 0–1)
Eosinophils Absolute: 0 10*3/uL (ref 0.0–0.7)
Eosinophils Relative: 0 % (ref 0–5)
Lymphs Abs: 1.9 10*3/uL (ref 0.7–4.0)
MCH: 30.8 pg (ref 26.0–34.0)
MCHC: 37.5 g/dL — ABNORMAL HIGH (ref 30.0–36.0)
MCV: 82.2 fL (ref 78.0–100.0)
Platelets: 215 10*3/uL (ref 150–400)
RDW: 13 % (ref 11.5–15.5)

## 2013-02-16 LAB — URINALYSIS, ROUTINE W REFLEX MICROSCOPIC
Glucose, UA: NEGATIVE mg/dL
Ketones, ur: NEGATIVE mg/dL
Protein, ur: NEGATIVE mg/dL
Urobilinogen, UA: 0.2 mg/dL (ref 0.0–1.0)

## 2013-02-16 LAB — BASIC METABOLIC PANEL
CO2: 25 mEq/L (ref 19–32)
Calcium: 8.4 mg/dL (ref 8.4–10.5)
Chloride: 80 mEq/L — ABNORMAL LOW (ref 96–112)
Glucose, Bld: 119 mg/dL — ABNORMAL HIGH (ref 70–99)
Potassium: 4 mEq/L (ref 3.5–5.1)
Sodium: 112 mEq/L — CL (ref 135–145)

## 2013-02-16 LAB — URINE MICROSCOPIC-ADD ON

## 2013-02-16 LAB — MRSA PCR SCREENING: MRSA by PCR: POSITIVE — AB

## 2013-02-16 LAB — COMPREHENSIVE METABOLIC PANEL
AST: 31 U/L (ref 0–37)
CO2: 27 mEq/L (ref 19–32)
Calcium: 9.4 mg/dL (ref 8.4–10.5)
Creatinine, Ser: 0.79 mg/dL (ref 0.50–1.10)
GFR calc Af Amer: 84 mL/min — ABNORMAL LOW (ref >90)
GFR calc non Af Amer: 73 mL/min — ABNORMAL LOW (ref >90)
Glucose, Bld: 119 mg/dL — ABNORMAL HIGH (ref 70–99)

## 2013-02-16 LAB — OSMOLALITY, URINE: Osmolality, Ur: 220 mOsm/kg — ABNORMAL LOW (ref 390–1090)

## 2013-02-16 LAB — POCT I-STAT TROPONIN I

## 2013-02-16 MED ORDER — CHLORHEXIDINE GLUCONATE CLOTH 2 % EX PADS
6.0000 | MEDICATED_PAD | Freq: Every day | CUTANEOUS | Status: DC
  Administered 2013-02-17 – 2013-02-20 (×4): 6 via TOPICAL

## 2013-02-16 MED ORDER — ACETAMINOPHEN 325 MG PO TABS
650.0000 mg | ORAL_TABLET | Freq: Four times a day (QID) | ORAL | Status: DC | PRN
  Administered 2013-02-16: 650 mg via ORAL
  Filled 2013-02-16: qty 2

## 2013-02-16 MED ORDER — LOSARTAN POTASSIUM 50 MG PO TABS
50.0000 mg | ORAL_TABLET | Freq: Every day | ORAL | Status: DC
  Administered 2013-02-17 – 2013-02-20 (×4): 50 mg via ORAL
  Filled 2013-02-16 (×4): qty 1

## 2013-02-16 MED ORDER — LEVOTHYROXINE SODIUM 75 MCG PO TABS
75.0000 ug | ORAL_TABLET | Freq: Every day | ORAL | Status: DC
  Administered 2013-02-17 – 2013-02-20 (×4): 75 ug via ORAL
  Filled 2013-02-16 (×7): qty 1

## 2013-02-16 MED ORDER — SODIUM CHLORIDE 0.9 % IV BOLUS (SEPSIS): 1000.0000 mL | Freq: Once | INTRAVENOUS | Status: DC

## 2013-02-16 MED ORDER — DM-GUAIFENESIN ER 30-600 MG PO TB12
1.0000 | ORAL_TABLET | Freq: Two times a day (BID) | ORAL | Status: DC | PRN
  Filled 2013-02-16: qty 1

## 2013-02-16 MED ORDER — VITAMIN B-12 1000 MCG PO TABS
1000.0000 ug | ORAL_TABLET | Freq: Every morning | ORAL | Status: DC
  Administered 2013-02-17 – 2013-02-20 (×4): 1000 ug via ORAL
  Filled 2013-02-16 (×4): qty 1

## 2013-02-16 MED ORDER — SODIUM CHLORIDE 0.9 % IV SOLN: INTRAVENOUS | Status: DC

## 2013-02-16 MED ORDER — DEXTROSE 5 % IV SOLN
500.0000 mg | Freq: Once | INTRAVENOUS | Status: AC
  Administered 2013-02-16: 500 mg via INTRAVENOUS
  Filled 2013-02-16: qty 500

## 2013-02-16 MED ORDER — METHYLCELLULOSE 1 % OP SOLN: 1.0000 [drp] | OPHTHALMIC | Status: DC | PRN

## 2013-02-16 MED ORDER — DEXTROSE 5 % IV SOLN
1.0000 g | Freq: Once | INTRAVENOUS | Status: AC
  Administered 2013-02-16: 1 g via INTRAVENOUS
  Filled 2013-02-16: qty 10

## 2013-02-16 MED ORDER — PIPERACILLIN-TAZOBACTAM 3.375 G IVPB
3.3750 g | Freq: Three times a day (TID) | INTRAVENOUS | Status: DC
  Administered 2013-02-16 – 2013-02-17 (×3): 3.375 g via INTRAVENOUS
  Filled 2013-02-16 (×5): qty 50

## 2013-02-16 MED ORDER — PANTOPRAZOLE SODIUM 40 MG PO TBEC
40.0000 mg | DELAYED_RELEASE_TABLET | Freq: Every day | ORAL | Status: DC
  Administered 2013-02-16 – 2013-02-20 (×5): 40 mg via ORAL
  Filled 2013-02-16 (×4): qty 1

## 2013-02-16 MED ORDER — POTASSIUM CHLORIDE CRYS ER 20 MEQ PO TBCR
20.0000 meq | EXTENDED_RELEASE_TABLET | Freq: Every day | ORAL | Status: DC
  Administered 2013-02-17: 20 meq via ORAL
  Filled 2013-02-16: qty 1

## 2013-02-16 MED ORDER — ASPIRIN EC 325 MG PO TBEC
325.0000 mg | DELAYED_RELEASE_TABLET | Freq: Every day | ORAL | Status: DC
  Administered 2013-02-17 – 2013-02-20 (×4): 325 mg via ORAL
  Filled 2013-02-16 (×4): qty 1

## 2013-02-16 MED ORDER — ENSURE COMPLETE PO LIQD
237.0000 mL | Freq: Three times a day (TID) | ORAL | Status: DC
  Administered 2013-02-17 – 2013-02-20 (×10): 237 mL via ORAL

## 2013-02-16 MED ORDER — POLYVINYL ALCOHOL 1.4 % OP SOLN: 1.0000 [drp] | OPHTHALMIC | Status: DC | PRN

## 2013-02-16 MED ORDER — MUPIROCIN 2 % EX OINT
1.0000 "application " | TOPICAL_OINTMENT | Freq: Two times a day (BID) | CUTANEOUS | Status: DC
  Administered 2013-02-16 – 2013-02-20 (×8): 1 via NASAL
  Filled 2013-02-16 (×2): qty 22

## 2013-02-16 MED ORDER — PINDOLOL 5 MG PO TABS
5.0000 mg | ORAL_TABLET | Freq: Two times a day (BID) | ORAL | Status: DC
  Administered 2013-02-16 – 2013-02-20 (×8): 5 mg via ORAL
  Filled 2013-02-16 (×9): qty 1

## 2013-02-16 MED ORDER — DEXTROSE 5 % IV SOLN
500.0000 mg | INTRAVENOUS | Status: DC
  Administered 2013-02-17 – 2013-02-18 (×2): 500 mg via INTRAVENOUS
  Filled 2013-02-16 (×3): qty 500

## 2013-02-16 MED ORDER — ENOXAPARIN SODIUM 30 MG/0.3ML ~~LOC~~ SOLN
30.0000 mg | SUBCUTANEOUS | Status: DC
  Administered 2013-02-16 – 2013-02-19 (×4): 30 mg via SUBCUTANEOUS
  Filled 2013-02-16 (×7): qty 0.3

## 2013-02-16 MED ORDER — ONDANSETRON HCL 4 MG/2ML IJ SOLN: 4.0000 mg | Freq: Four times a day (QID) | INTRAMUSCULAR | Status: DC | PRN

## 2013-02-16 MED ORDER — SODIUM CHLORIDE 0.9 % IV SOLN
INTRAVENOUS | Status: DC
  Administered 2013-02-16 – 2013-02-18 (×5): via INTRAVENOUS

## 2013-02-16 MED ORDER — SODIUM CHLORIDE 0.9 % IV SOLN
INTRAVENOUS | Status: DC
  Administered 2013-02-16: 16:00:00 via INTRAVENOUS

## 2013-02-16 MED ORDER — SENNOSIDES-DOCUSATE SODIUM 8.6-50 MG PO TABS
1.0000 | ORAL_TABLET | Freq: Every evening | ORAL | Status: DC | PRN
Start: 2013-02-16 — End: 2013-02-17
  Filled 2013-02-16: qty 1

## 2013-02-16 NOTE — Progress Notes (Signed)
ANTIBIOTIC CONSULT NOTE - INITIAL  Pharmacy Consult for Zosyn Indication: recurrent asp pna  Allergies  Allergen Reactions  . Aspirin Hives    High Doses    Patient Measurements: Height: 5\' 5"  (165.1 cm) Weight: 105 lb (47.628 kg) IBW/kg (Calculated) : 57  Vital Signs: Temp: 97.8 F (36.6 C) (07/07 1424) Temp src: Oral (07/07 1424) BP: 148/75 mmHg (07/07 1645) Pulse Rate: 83 (07/07 1645) Intake/Output from previous day:   Intake/Output from this shift:    Labs:  Recent Labs  02/16/13 1450  WBC 4.7  HGB 9.7*  PLT 215  CREATININE 0.79   Estimated Creatinine Clearance: 37.2 ml/min (by C-G formula based on Cr of 0.79). No results found for this basename: VANCOTROUGH, VANCOPEAK, VANCORANDOM, GENTTROUGH, GENTPEAK, GENTRANDOM, TOBRATROUGH, TOBRAPEAK, TOBRARND, AMIKACINPEAK, AMIKACINTROU, AMIKACIN,  in the last 72 hours   Microbiology: No results found for this or any previous visit (from the past 720 hour(s)).  Medical History: Past Medical History  Diagnosis Date  . Unspecified sinusitis (chronic)   . Unspecified essential hypertension   . Cerebrovascular disease, unspecified   . Unspecified venous (peripheral) insufficiency   . Pure hypercholesterolemia   . Unspecified disorder of thyroid   . Esophageal reflux   . Unspecified constipation   . Urinary tract infection, site not specified   . Osteoarthrosis, unspecified whether generalized or localized, unspecified site   . Degeneration of cervical intervertebral disc   . Closed fracture of other bone of wrist   . Anxiety state, unspecified   . Idiopathic urticaria   . Anemia, unspecified   . Other B-complex deficiencies   . Herpes zoster without mention of complication   . Stroke     Medications:  Prescriptions prior to admission  Medication Sig Dispense Refill  . aspirin EC 325 MG tablet Take 1 tablet (325 mg total) by mouth daily.  30 tablet  0  . cyanocobalamin 1000 MCG tablet Take 1,000 mcg by mouth  every morning.       . feeding supplement (ENSURE COMPLETE) LIQD Take 237 mLs by mouth daily.      . lansoprazole (PREVACID) 30 MG capsule Take 30 mg by mouth daily.       Marland Kitchen levofloxacin (LEVAQUIN) 500 MG tablet Take 1 tablet (500 mg total) by mouth daily.  7 tablet  0  . levothyroxine (SYNTHROID, LEVOTHROID) 75 MCG tablet Take 75 mcg by mouth every morning.       Marland Kitchen losartan (COZAAR) 50 MG tablet Take 1 tablet (50 mg total) by mouth daily.  30 tablet  11  . methylcellulose (ARTIFICIAL TEARS) 1 % ophthalmic solution Place 1 drop into both eyes as needed.      . pindolol (VISKEN) 5 MG tablet Take 5 mg by mouth 2 (two) times daily.      . potassium chloride SA (K-DUR,KLOR-CON) 20 MEQ tablet Take 20 mEq by mouth daily.      Marland Kitchen senna-docusate (SENOKOT-S) 8.6-50 MG per tablet Take 1-2 tablet by mouth at bedtime       Assessment: 79 y.o. female presents with lethargy, cough, SOB. Noted pt with h/o of recurrent aspiration PNA, UTIs. Just finished 7-day course of levaquin today for UTI. To begin zosyn and azithromycin for asp pna.  Goal of Therapy:  Eradication of infection  Plan:  1. Zosyn 3.375gm IV q8h. Each dose over 4 hours 2. Will f/u microbiological data, renal function, pt's clinical condition  Christoper Fabian, PharmD, BCPS Clinical pharmacist, pager [phone] 02/16/2013,5:49 PM

## 2013-02-16 NOTE — ED Provider Notes (Signed)
History    CSN: [id number] Arrival date & time 02/16/13  1413  First MD Initiated Contact with Patient 02/16/13 [phone]     Chief Complaint  Patient presents with  . Weakness   (Consider location/radiation/quality/duration/timing/severity/associated sxs/prior Treatment) HPI The patient presents with family members who relate much of the history of present illness. The patient herself states that she feels generally unwell with intermittent chills, weakness over the past week.  She denies focal pain. She denies confusion, dyspnea, but endorses mild cough. Patient's family state the past week she has been progressively more listless with increasing confusion. He states that she is now predominantly this oriented. The patient had diagnosis of urinary tract infection one week ago, for which she just completed a course of Levaquin. The patient lives in an independent living facility.   Past Medical History  Diagnosis Date  . Unspecified sinusitis (chronic)   . Unspecified essential hypertension   . Cerebrovascular disease, unspecified   . Unspecified venous (peripheral) insufficiency   . Pure hypercholesterolemia   . Unspecified disorder of thyroid   . Esophageal reflux   . Unspecified constipation   . Urinary tract infection, site not specified   . Osteoarthrosis, unspecified whether generalized or localized, unspecified site   . Degeneration of cervical intervertebral disc   . Closed fracture of other bone of wrist   . Anxiety state, unspecified   . Idiopathic urticaria   . Anemia, unspecified   . Other B-complex deficiencies   . Herpes zoster without mention of complication   . Stroke    Past Surgical History  Procedure Laterality Date  . Tonsillectomy    . Total abdominal hysterectomy w/ bilateral salpingoophorectomy    . Cervical fusion    . Cataract extraction    . Nasal sinus surgery    . Esophagogastroduodenoscopy  09/10/2011    Procedure: ESOPHAGOGASTRODUODENOSCOPY  (EGD);  Surgeon: Petra Kuba, MD;  Location[facility] ENDOSCOPY;  Service: Endoscopy;  Laterality: N/A;  c-arm needed  . Savory dilation  09/10/2011    Procedure: SAVORY DILATION;  Surgeon: Petra Kuba, MD;  Location: [facility] ENDOSCOPY;  Service: Endoscopy;  Laterality: N/A;  . Esophagogastroduodenoscopy  05/13/2012    Procedure: ESOPHAGOGASTRODUODENOSCOPY (EGD);  Surgeon: Petra Kuba, MD;  Location: [facility] ENDOSCOPY;  Service: Endoscopy;  Laterality: N/A;  with carm  . Savory dilation  05/13/2012    Procedure: SAVORY DILATION;  Surgeon: Petra Kuba, MD;  Location: [facility] ENDOSCOPY;  Service: Endoscopy;  Laterality: N/A;   Family History  Problem Relation Age of Onset  . Diabetes Father   . Heart failure Mother   . Diabetes Sister    History  Substance Use Topics  . Smoking status: Never Smoker   . Smokeless tobacco: Never Used  . Alcohol Use: No   OB History   Grav Para Term Preterm Abortions TAB SAB Ect Mult Living                 Review of Systems  Constitutional:       Per HPI, otherwise negative  HENT:       Per HPI, otherwise negative  Respiratory:       Per HPI, otherwise negative  Cardiovascular:       Per HPI, otherwise negative  Gastrointestinal: Negative for vomiting.  Endocrine:       Negative aside from HPI  Genitourinary:       Neg aside from HPI   Musculoskeletal:  Per HPI, otherwise negative  Skin: Negative.   Neurological: Negative for syncope.    Allergies  Aspirin  Home Medications   Current Outpatient Rx  Name  Route  Sig  Dispense  Refill  . aspirin EC 325 MG tablet   Oral   Take 1 tablet (325 mg total) by mouth daily.   30 tablet   0   . cyanocobalamin 1000 MCG tablet   Oral   Take 1,000 mcg by mouth every morning.          . feeding supplement (ENSURE COMPLETE) LIQD   Oral   Take 237 mLs by mouth daily.         . lansoprazole (PREVACID) 30 MG capsule   Oral   Take 30 mg by mouth daily.          Marland Kitchen levofloxacin (LEVAQUIN) 500 MG  tablet   Oral   Take 1 tablet (500 mg total) by mouth daily.   7 tablet   0   . levothyroxine (SYNTHROID, LEVOTHROID) 75 MCG tablet   Oral   Take 75 mcg by mouth every morning.          Marland Kitchen losartan (COZAAR) 50 MG tablet   Oral   Take 1 tablet (50 mg total) by mouth daily.   30 tablet   11   . methylcellulose (ARTIFICIAL TEARS) 1 % ophthalmic solution   Both Eyes   Place 1 drop into both eyes as needed.         . pindolol (VISKEN) 5 MG tablet   Oral   Take 5 mg by mouth 2 (two) times daily.         . potassium chloride SA (K-DUR,KLOR-CON) 20 MEQ tablet   Oral   Take 20 mEq by mouth daily.         Marland Kitchen senna-docusate (SENOKOT-S) 8.6-50 MG per tablet      Take 1-2 tablet by mouth at bedtime          BP 155/75  Pulse 71  Temp(Src) 97.8 F (36.6 C) (Oral)  Resp 20  Ht 5\' 5"  (1.651 m)  Wt 105 lb (47.628 kg)  BMI 17.47 kg/m2  SpO2 99% Physical Exam  Nursing note and vitals reviewed. Constitutional: She appears listless. She appears cachectic. She has a sickly appearance.  HENT:  Head: Normocephalic and atraumatic.  Mouth/Throat: Mucous membranes are dry.  Eyes: Conjunctivae and EOM are normal.  Cardiovascular: Normal rate and regular rhythm.   Murmur heard. Pulmonary/Chest: Effort normal and breath sounds normal. No stridor. No respiratory distress.  Abdominal: She exhibits no distension.  Musculoskeletal: She exhibits no edema.  Neurological: She appears listless. No cranial nerve deficit.  Patient is awake and alert, oriented to self and place  Skin: Skin is warm and dry.  Psychiatric: She has a normal mood and affect.    ED Course  Procedures (including critical care time) Labs Reviewed  CBC WITH DIFFERENTIAL - Abnormal; Notable for the following:    RBC 3.15 (*)    Hemoglobin 9.7 (*)    HCT 25.9 (*)    MCHC 37.5 (*)    All other components within normal limits  URINALYSIS, ROUTINE W REFLEX MICROSCOPIC - Abnormal; Notable for the following:     APPearance HAZY (*)    Leukocytes, UA TRACE (*)    All other components within normal limits  COMPREHENSIVE METABOLIC PANEL - Abnormal; Notable for the following:    Sodium 114 (*)    Chloride  78 (*)    Glucose, Bld 119 (*)    GFR calc non Af Amer 73 (*)    GFR calc Af Amer 84 (*)    All other components within normal limits  URINE MICROSCOPIC-ADD ON - Abnormal; Notable for the following:    Squamous Epithelial / LPF FEW (*)    All other components within normal limits  LIPASE, BLOOD - Abnormal; Notable for the following:    Lipase 60 (*)    All other components within normal limits  POCT I-STAT TROPONIN I  CG4 I-STAT (LACTIC ACID)   Dg Chest 2 View  02/16/2013   *RADIOLOGY REPORT*  Clinical Data: Left-sided chest pain  CHEST - 2 VIEW  Comparison: 11/19/2012  Findings: Two-view exam shows emphysema.  There is some new patchy airspace disease in the right apex. The cardiopericardial silhouette is enlarged. Bones are diffusely demineralized.  IMPRESSION: New patchy airspace disease in the medial right apex.  Imaging features are suspicious for pneumonia.  Follow-up recommended to ensure resolution.   Original Report Authenticated By: Kennith Center, M.D.   1. Hyponatremia   2. CAP (community acquired pneumonia)    Pulse ox 99% room air normal Cardiac 70 sinus rhythm normal no   Date: 02/16/2013  Rate: 72  Rhythm: normal sinus rhythm  QRS Axis: left  Intervals: normal  ST/T Wave abnormalities: nonspecific ST/T changes  Conduction Disutrbances:nonspecific intraventricular conduction delay  Narrative Interpretation:   Old EKG Reviewed: unchanged ABNORMAL  Update: Patient seems comfortable.   Update: Labs demonstrate sodium of 114.  IV fluids were previously started empirically, these will be adjusted for her hyponatremia.   Update: Chest x-ray chemistries opacification concerning for pneumonia.  The patient just completed a course of Levaquin.  She'll be started on ceftriaxone,  azithromycin.  4:29 PM I discussed the patient's case with our admitting hospitalist.  The patient we admitted for further evaluation and management.  Given her critically abnormal sodium level she will require placement in the step down unit.  MDM  This 79 year old female presents with listlessness, confusion.  On exam she is cachectic, but in no distress.  The patient had critically abnormal sodium requiring placement in the step down unit.  Additionally, with community for pneumonia she was provided empiric antibiotics.  CRITICAL CARE Performed by: Gerhard Munch Total critical care time: 35 Critical care time was exclusive of separately billable procedures and treating other patients. Critical care was necessary to treat or prevent imminent or life-threatening deterioration. Critical care was time spent personally by me on the following activities: development of treatment plan with patient and/or surrogate as well as nursing, discussions with consultants, evaluation of patient's response to treatment, examination of patient, obtaining history from patient or surrogate, ordering and performing treatments and interventions, ordering and review of laboratory studies, ordering and review of radiographic studies, pulse oximetry and re-evaluation of patient's condition.   Gerhard Munch, MD 02/16/13 [phone]

## 2013-02-16 NOTE — ED Notes (Signed)
Per EMS pt c/o generalized weakness x2 days. Pt just completed 7 day abx tx for UTI still having dysuria and oliguria. Pt placed on 12lead- pt has new onset of atrial fib. Daughter denies hx of a.fib

## 2013-02-16 NOTE — ED Notes (Signed)
Admitting at bedside 

## 2013-02-16 NOTE — Progress Notes (Signed)
CRITICAL VALUE ALERT  Critical value received:  Na+ 112  Date of notification:  02/16/2013  Time of notification:  2142  Critical value read back:yes  Nurse who received alert:  Beryle Quant, RN  MD notified (1st page):  Donnamarie Poag of [facility]  Time of first page:  2134  MD notified (2nd page):  Time of second page:  Responding MD:  Donnamarie Poag of [facility]  Time MD responded:  2140  Fluid order increased to NS @125 . Will continue to monitor.

## 2013-02-16 NOTE — H&P (Addendum)
[facility] History and Physical  Margaret [name]:[id number] DOB: 02-21-1924 DOA: 02/16/2013  Referring physician: EDP PCP: Michele Mcalpine, MD   Chief Complaint: feel, lethargic, not herself  HPI: Margaret [name] is a 79 y.o. female with h/o Dementia, CVA, UTIs, Chronic hyponatremia, Severe Dysphagia with recurrent aspiration pneumonia who is followed by palliative care services at home is brought to the ER by her daughter with the above complaint. Pts daughter reports that she was treated for a UTI 7 days ago with PO levaquin which she completed today. However for the past 7-10 days has been fatigued, more confused than usual, intermittently hallucinating, sleeping a lot. In addition also has been getting short of breath with little activity and having a congested cough for 4-5days. Upon evaluation in ER noted to have a worsening of her hyponatremia with sodium of 114, CXR with new R lung air space disease.  Of note, during last hospitalization in 3/14 for aspiration pneumonia and severe dysphagia had a palliative care and speech evaluation at the time and decision was made for DNR, no feeding tube and supportive care and treatment with IVF, abx only and is being followed by [facility] since then.   Review of Systems: The patient denies anorexia, fever, weight loss,, vision loss, decreased hearing, hoarseness, chest pain, syncope, dyspnea on exertion, peripheral edema, balance deficits, hemoptysis, abdominal pain, melena, hematochezia, severe indigestion/heartburn, hematuria, incontinence, genital sores, muscle weakness, suspicious skin lesions, transient blindness, difficulty walking, depression, unusual weight change, abnormal bleeding, enlarged lymph nodes, angioedema, and breast masses.    Past Medical History  Diagnosis Date  . Unspecified sinusitis (chronic)   . Unspecified essential hypertension   . Cerebrovascular disease, unspecified   . Unspecified venous (peripheral)  insufficiency   . Pure hypercholesterolemia   . Unspecified disorder of thyroid   . Esophageal reflux   . Unspecified constipation   . Urinary tract infection, site not specified   . Osteoarthrosis, unspecified whether generalized or localized, unspecified site   . Degeneration of cervical intervertebral disc   . Closed fracture of other bone of wrist   . Anxiety state, unspecified   . Idiopathic urticaria   . Anemia, unspecified   . Other B-complex deficiencies   . Herpes zoster without mention of complication   . Stroke    Past Surgical History  Procedure Laterality Date  . Tonsillectomy    . Total abdominal hysterectomy w/ bilateral salpingoophorectomy    . Cervical fusion    . Cataract extraction    . Nasal sinus surgery    . Esophagogastroduodenoscopy  09/10/2011    Procedure: ESOPHAGOGASTRODUODENOSCOPY (EGD);  Surgeon: Petra Kuba, MD;  Location: [facility] ENDOSCOPY;  Service: Endoscopy;  Laterality: N/A;  c-arm needed  . Savory dilation  09/10/2011    Procedure: SAVORY DILATION;  Surgeon: Petra Kuba, MD;  Location: [facility] ENDOSCOPY;  Service: Endoscopy;  Laterality: N/A;  . Esophagogastroduodenoscopy  05/13/2012    Procedure: ESOPHAGOGASTRODUODENOSCOPY (EGD);  Surgeon: Petra Kuba, MD;  Location: [facility] ENDOSCOPY;  Service: Endoscopy;  Laterality: N/A;  with carm  . Savory dilation  05/13/2012    Procedure: SAVORY DILATION;  Surgeon: Petra Kuba, MD;  Location: [facility] ENDOSCOPY;  Service: Endoscopy;  Laterality: N/A;   Social History:  reports that she has never smoked. She has never used smokeless tobacco. She reports that she does not drink alcohol or use illicit drugs. Lives in independent living facility, ambulates with a walker  Allergies  Allergen Reactions  . Aspirin  Hives    High Doses    Family History  Problem Relation Age of Onset  . Diabetes Father   . Heart failure Mother   . Diabetes Sister     Prior to Admission medications   Medication Sig Start Date End Date  Taking? Authorizing Provider  aspirin EC 325 MG tablet Take 1 tablet (325 mg total) by mouth daily. 08/19/12  Yes Hollice Espy, MD  cyanocobalamin 1000 MCG tablet Take 1,000 mcg by mouth every morning.    Yes Historical Provider, MD  feeding supplement (ENSURE COMPLETE) LIQD Take 237 mLs by mouth daily. 04/28/12  Yes Meredeth Ide, MD  lansoprazole (PREVACID) 30 MG capsule Take 30 mg by mouth daily.    Yes Historical Provider, MD  levofloxacin (LEVAQUIN) 500 MG tablet Take 1 tablet (500 mg total) by mouth daily. 02/10/13  Yes Michele Mcalpine, MD  levothyroxine (SYNTHROID, LEVOTHROID) 75 MCG tablet Take 75 mcg by mouth every morning.    Yes Historical Provider, MD  losartan (COZAAR) 50 MG tablet Take 1 tablet (50 mg total) by mouth daily. 09/03/12  Yes Michele Mcalpine, MD  methylcellulose (ARTIFICIAL TEARS) 1 % ophthalmic solution Place 1 drop into both eyes as needed.   Yes Historical Provider, MD  pindolol (VISKEN) 5 MG tablet Take 5 mg by mouth 2 (two) times daily.   Yes Pricilla Riffle, MD  potassium chloride SA (K-DUR,KLOR-CON) 20 MEQ tablet Take 20 mEq by mouth daily.   Yes Historical Provider, MD  senna-docusate (SENOKOT-S) 8.6-50 MG per tablet Take 1-2 tablet by mouth at bedtime 04/28/12  Yes Meredeth Ide, MD   Physical Exam: Filed Vitals:   02/16/13 1545 02/16/13 1600 02/16/13 1615 02/16/13 1645  BP: 142/120 155/75 164/73 148/75  Pulse: 80 71 71 83  Temp:      TempSrc:      Resp: 15 20 15    Height:      Weight:      SpO2: 100% 99% 100% 99%     General: elderly frail female, no distress, alert, awake, oriented to self, and place  HEENT: PERRLA, EOMi, oral mucosa dry,  Cardiovascular:S1S2/RRR  Respiratory: scattered ronchi  Abdomen: soft, Nt, BS present  Skin:no rashes, decreased skin turgor  Musculoskeletal: no edema c/c  Psychiatric:appropriate mood and affect  Neurologic: non focal, moves all extremities  Labs on Admission:  Basic Metabolic Panel:  Recent Labs Lab  02/16/13 1450  NA 114*  K 4.4  CL 78*  CO2 27  GLUCOSE 119*  BUN 17  CREATININE 0.79  CALCIUM 9.4   Liver Function Tests:  Recent Labs Lab 02/16/13 1450  AST 31  ALT 15  ALKPHOS 65  BILITOT 0.4  PROT 7.2  ALBUMIN 4.0    Recent Labs Lab 02/16/13 1450  LIPASE 60*   No results found for this basename: AMMONIA,  in the last 168 hours CBC:  Recent Labs Lab 02/16/13 1450  WBC 4.7  NEUTROABS 2.2  HGB 9.7*  HCT 25.9*  MCV 82.2  PLT 215   Cardiac Enzymes: No results found for this basename: CKTOTAL, CKMB, CKMBINDEX, TROPONINI,  in the last 168 hours  BNP (last 3 results)  Recent Labs  08/14/12 1134  PROBNP 1779.0*   CBG: No results found for this basename: GLUCAP,  in the last 168 hours  Radiological Exams on Admission: Dg Chest 2 View  02/16/2013   *RADIOLOGY REPORT*  Clinical Data: Left-sided chest pain  CHEST - 2 VIEW  Comparison: 11/19/2012  Findings: Two-view exam shows emphysema.  There is some new patchy airspace disease in the right apex. The cardiopericardial silhouette is enlarged. Bones are diffusely demineralized.  IMPRESSION: New patchy airspace disease in the medial right apex.  Imaging features are suspicious for pneumonia.  Follow-up recommended to ensure resolution.   Original Report Authenticated By: Kennith Center, M.D.    EKG: Independently reviewed. NO acute ST T wave changes  Assessment/Plan Active Problems:   Hyponatremia   1. Aspiration pneumonia -severe dysphagia and continued aspiration risk -will treat with IV Zosyn and azithromycin and de-escalate antibiotics based on clinical improvement. -due to Severe dysphagia, but significant aspiration risk regardless of consistency of food, will continue dysphagia 3 with thin liquids based on last swallow eval 3/14 -pt and family do not want feeding tube or aggressive care  -is being followed by palliative care at independent living and daughter is reasonable and  agreeable to switch focus  of care if she declines or deoes not respond to fluids, antibiotics etc.  2. Hyponatremia: -Acute on chronic, mildly symptomatic from this -baseline sodium 125 range -clinically appears dry, will gently hydrate Ns at 75cc/hr -repeat Bmet in 6 hours then in am -check Urine sodium and osm -Follow I/Os, weights  3.  GERD: cotninue PPI  4. H/o CVA  -continue ASA  5. Hypothyroidism: -continue Synthroid  6. HTN -continue Cozaar  Code Status:DNR Family Communication: d/w daughter at bedside Disposition Plan: admit to SDU  Time spent:  [facility] Pager [phone]  If 7PM-7AM, please contact night-coverage [email or web address] Password [email or web address] 02/16/2013, 5:11 PM

## 2013-02-17 DIAGNOSIS — N179 Acute kidney failure, unspecified: Secondary | ICD-10-CM

## 2013-02-17 DIAGNOSIS — Z515 Encounter for palliative care: Secondary | ICD-10-CM

## 2013-02-17 DIAGNOSIS — K5900 Constipation, unspecified: Secondary | ICD-10-CM

## 2013-02-17 LAB — BASIC METABOLIC PANEL
CO2: 25 mEq/L (ref 19–32)
Chloride: 86 mEq/L — ABNORMAL LOW (ref 96–112)
Creatinine, Ser: 0.68 mg/dL (ref 0.50–1.10)
Potassium: 3.7 mEq/L (ref 3.5–5.1)

## 2013-02-17 LAB — CBC
HCT: 23.1 % — ABNORMAL LOW (ref 36.0–46.0)
Hemoglobin: 8.6 g/dL — ABNORMAL LOW (ref 12.0–15.0)
MCH: 31.2 pg (ref 26.0–34.0)
MCV: 83.7 fL (ref 78.0–100.0)
RBC: 2.76 MIL/uL — ABNORMAL LOW (ref 3.87–5.11)
WBC: 4.4 10*3/uL (ref 4.0–10.5)

## 2013-02-17 LAB — LEGIONELLA ANTIGEN, URINE

## 2013-02-17 MED ORDER — POTASSIUM CHLORIDE CRYS ER 20 MEQ PO TBCR
20.0000 meq | EXTENDED_RELEASE_TABLET | Freq: Every day | ORAL | Status: DC
  Administered 2013-02-17 – 2013-02-18 (×2): 20 meq via ORAL
  Filled 2013-02-17 (×2): qty 1

## 2013-02-17 MED ORDER — SENNOSIDES-DOCUSATE SODIUM 8.6-50 MG PO TABS
1.0000 | ORAL_TABLET | Freq: Every day | ORAL | Status: DC
  Administered 2013-02-17 – 2013-02-19 (×3): 1 via ORAL
  Filled 2013-02-17 (×7): qty 1

## 2013-02-17 NOTE — Progress Notes (Signed)
Utilization review completed.  

## 2013-02-17 NOTE — Progress Notes (Signed)
[facility] Progress Note [place] TEAM 1 - Stepdown/ICU TEAM   SHALAMAR PLOURDE ZOX:[id number] DOB: 10-05-1923 DOA: 02/16/2013 PCP: Michele Mcalpine, MD  Brief narrative: 79 y.o. female with h/o Dementia, CVA, UTIs, Chronic hyponatremia, Severe Dysphagia with recurrent aspiration pneumonia who is followed by palliative care services at home was brought to the ER by her daughter with the above complaint.  Pts daughter reported that she was treated for a UTI 7 days ago with PO levaquin which she completed today.   However for the past 7-10 days has been fatigued, more confused than usual, intermittently hallucinating, sleeping a lot. In addition also has been getting short of breath with little activity and having a congested cough for 4-5days.   Upon evaluation in ER noted to have a worsening of her hyponatremia with sodium of 114, CXR with new R lung air space disease.   Of note, during last hospitalization in 3/14 for aspiration pneumonia and severe dysphagia had a palliative care and speech evaluation at the time and decision was made for DNR, no feeding tube and supportive care and treatment with IVF, abx only and is being followed by [facility] since then.   Assessment/Plan: Active Problems:   Hyponatremia/chronic recurrent -improving with hydration    Parkinson's disease/Senile dementia, uncomplicated -slowly progressive and dtr notes ST memory has worsened over the past few months -Dtr requests to meet with [facility] Palliative team again    HYPERTENSION -has isolated systolic HTN -avoid overcorrection -cont ARB    Dysphagia/recurrent aspiration pneumonia -cont Zmax and supportive care-dc Zosyn -D1 diet and allow appropriate foods/liquids from home -cont PPI    Chronic diastolic CHF (congestive heart failure) -compensated    ANEMIA -Hgb stable    Hypothyroidism -cont Synthroid   DVT prophylaxis: Lovenox Code Status: DO NOT RESUSCITATE Family Communication: Spoke with  patient, her daughter and her sister at bedside Disposition Plan: Transfer to floor Isolation: Contact isolation for MRSA PCR positive status Nutritional Status: Chronic moderate to severe protein calorie malnutrition related to ongoing issues with dysphagia and dementia  Consultants: Palliative medicine  Procedures: None  Antibiotics: Zithromax 7/7 >>> Zosyn 7/7 >>> 7/8  HPI/Subjective: Patient alert but without any specific complaints   Objective: Blood pressure 147/66, pulse 74, temperature 98.4 F (36.9 C), temperature source Oral, resp. rate 16, height 5\' 5"  (1.651 m), weight 53.3 kg (117 lb 8.1 oz), SpO2 98.00%.  Intake/Output Summary (Last 24 hours) at 02/17/13 1445 Last data filed at 02/17/13 1212  Gross per 24 hour  Intake 1496.25 ml  Output   1825 ml  Net -328.75 ml     Exam: General: No acute respiratory distress, cachectic-appearing Lungs: Clear to auscultation bilaterally without wheezes or crackles, RA Cardiovascular: Regular rate and first degree AV block without murmur gallop or rub normal S1 and S2, no peripheral edema or JVD Abdomen: Nontender, nondistended, soft, bowel sounds positive, no rebound, no ascites, no appreciable mass Musculoskeletal: No significant cyanosis, clubbing of bilateral lower extremities Neurological: Alert and oriented x name and place, moves all extremities x 4 without focal neurological deficits, CN 2-12 intact  Scheduled Meds: Scheduled Meds: . aspirin EC  325 mg Oral Daily  . azithromycin  500 mg Intravenous Q24H  . Chlorhexidine Gluconate Cloth  6 each Topical Q0600  . enoxaparin (LOVENOX) injection  30 mg Subcutaneous Q24H  . feeding supplement  237 mL Oral TID WC  . levothyroxine  75 mcg Oral QAC breakfast  . losartan  50 mg Oral Daily  .  mupirocin ointment  1 application Nasal BID  . pantoprazole  40 mg Oral Daily  . pindolol  5 mg Oral BID  . piperacillin-tazobactam (ZOSYN)  IV  3.375 g Intravenous Q8H  .  potassium chloride SA  20 mEq Oral Daily  . cyanocobalamin  1,000 mcg Oral q morning - 10a   Continuous Infusions: . sodium chloride 125 mL/hr at 02/17/13 0837    **Reviewed in detail by the Attending Physician   Data Reviewed: Basic Metabolic Panel:  Recent Labs Lab 02/16/13 1450 02/16/13 2020 02/17/13 0444  NA 114* 112* 117*  K 4.4 4.0 3.7  CL 78* 80* 86*  CO2 27 25 25   GLUCOSE 119* 119* 94  BUN 17 15 11   CREATININE 0.79 0.68 0.68  CALCIUM 9.4 8.4 8.4   Liver Function Tests:  Recent Labs Lab 02/16/13 1450  AST 31  ALT 15  ALKPHOS 65  BILITOT 0.4  PROT 7.2  ALBUMIN 4.0    Recent Labs Lab 02/16/13 1450  LIPASE 60*   No results found for this basename: AMMONIA,  in the last 168 hours CBC:  Recent Labs Lab 02/16/13 1450 02/17/13 0444  WBC 4.7 4.4  NEUTROABS 2.2  --   HGB 9.7* 8.6*  HCT 25.9* 23.1*  MCV 82.2 83.7  PLT 215 201   Cardiac Enzymes: No results found for this basename: CKTOTAL, CKMB, CKMBINDEX, TROPONINI,  in the last 168 hours BNP (last 3 results)  Recent Labs  08/14/12 1134  PROBNP 1779.0*   CBG: No results found for this basename: GLUCAP,  in the last 168 hours  Recent Results (from the past 240 hour(s))  MRSA PCR SCREENING     Status: Abnormal   Collection Time    02/16/13  5:12 PM      Result Value Range Status   MRSA by PCR POSITIVE (*) NEGATIVE Final   Comment:            The GeneXpert MRSA Assay (FDA     approved for NASAL specimens     only), is one component of a     comprehensive MRSA colonization     surveillance program. It is not     intended to diagnose MRSA     infection nor to guide or     monitor treatment for     MRSA infections.     RESULT CALLED TO, READ BACK BY AND VERIFIED WITH:     Adalberto Ill RN [id number] 02/16/13 A BROWNING     Studies:  Recent x-ray studies have been reviewed in detail by the Attending Physician    Junious Silk, ANP [facility] Office  [phone] Pager  [phone]  **If unable to reach the above provider after paging please contact the Flow Manager @ [phone]  On-Call/Text Page:      [email or web address]      password [email or web address]  If 7PM-7AM, please contact night-coverage [email or web address] Password [email or web address] 02/17/2013, 2:45 PM   LOS: 1 day   I have examined the patient, reviewed the chart and modified the above note which I agree with.   Chanya Chrisley,MD [phone] 02/17/2013, 3:38 PM

## 2013-02-17 NOTE — Consult Note (Signed)
Patient [name]      DOB: Apr 14, 1924      OZH:[id number]     Consult Note from the Palliative Medicine Team at [facility]    Consult Requested GE:[email or web address] Rennis Harding NP      PCP: Michele Mcalpine, MD Reason for Consultation:Per request of patients daughter     Phone Number:[phone]  Assessment of patients Current state: Patient sitting up in bed, appears comfortable, respiratory rate 18/min, unlabored, able to take sips of water at bedside no coughing or choking noted at this time. Denies any presence of pain or difficulty breathing at rest, does admit to SOB with exertion.  Reviewed chart, spoke with staff and proceeded to have discussion with patient, her [name] and son-in-law Margaret [name]. Per family patient has been getting along fairly well, does have occasional bouts of confusion at [name], has paid CNA present. Has been ambulating with walker assistance, continent of bowel and bladder.   We discussed current medical issues, including current positive PCR/MRSA. We also reviewed overall Goals of Care. Decision made to continue current level of medical care, emphasized they would want treatable medical issues treated. Would want patient brought back to hospital for any acute issues at this point.  Discussed the philosophy of palliative care and hospice support and services provided. Also engaged in decision-making with family regarding concepts of Medical Orders for Scope of Treatment pertaining to cardiac and pulmonary resuscitation, desire for acute future medical interventions for issues, the use of antibiotic therapy, intravenous hydration and continuing artificial tube feedings.    Goals of Care: 1.  Code Status: DNR/DNI   Continue current level of medical intervention-continue to treat treatable medical problems   2. Scope of Treatment: 1. Vital Signs: routine 2. Respiratory/Oxygen: support as indicated-would not want BIPAP used  3. Nutritional Support/Tube  Feeds: No artificial means utilized for nutrition 4. Antibiotics: as indicated for identified infections 5. Blood Products: if indicated 6. IVF: for defined period of time 7. Review of Medications to be discontinued: None 8. Labs: as indicated 9. Telemetry: as indicated 10. Consults: Spiritual   4. Disposition: Recommend return to IDL facility with Palliative care services to follow   3. Symptom Management:   1. Pain: Tylenol 650 mg orally every 6 hours as needed 2. Constipation: Ordered Senokot-S daily at HS 3. Dysphagia: modified diet, aspiration precautions  4. Psychosocial: Patient lives at [facility] facility, had palliative care services of [place] following her at residence. Daughter and son-in-law primary caregivers, also have paid CNA assistance.   5. Spiritual: consult placed per family request  Patient Documents Completed or Given: Document Given Completed  Advanced Directives Pkt    MOST  YES  DNR    Gone from My [facility]    Hard Choices      Brief HPI: 79 yo WF PMH Dementia, CVA, chronic hyponatremia andsevere dysphagia, that is intermittent. Brought to the ER 02/16/13 due to increased lethargy. Was being treated at home with 7 day course of Levaquin for UTI which was completed on 7/7. CXR this admission indicative pneumonia.   ROS: + constipation, +dysphagia (intermitent), denies pain, cough    PMH:  Past Medical History  Diagnosis Date  . Unspecified sinusitis (chronic)   . Unspecified essential hypertension   . Cerebrovascular disease, unspecified   . Unspecified venous (peripheral) insufficiency   . Pure hypercholesterolemia   . Unspecified disorder of thyroid   . Esophageal reflux   . Unspecified constipation   . Urinary tract infection,  site not specified   . Osteoarthrosis, unspecified whether generalized or localized, unspecified site   . Degeneration of cervical intervertebral disc   . Closed fracture of other bone of wrist   . Anxiety  state, unspecified   . Idiopathic urticaria   . Anemia, unspecified   . Other B-complex deficiencies   . Herpes zoster without mention of complication   . Stroke      PSH: Past Surgical History  Procedure Laterality Date  . Tonsillectomy    . Total abdominal hysterectomy w/ bilateral salpingoophorectomy    . Cervical fusion    . Cataract extraction    . Nasal sinus surgery    . Esophagogastroduodenoscopy  09/10/2011    Procedure: ESOPHAGOGASTRODUODENOSCOPY (EGD);  Surgeon: Petra Kuba, MD;  Location[facility] ENDOSCOPY;  Service: Endoscopy;  Laterality: N/A;  c-arm needed  . Savory dilation  09/10/2011    Procedure: SAVORY DILATION;  Surgeon: Petra Kuba, MD;  Location: [facility] ENDOSCOPY;  Service: Endoscopy;  Laterality: N/A;  . Esophagogastroduodenoscopy  05/13/2012    Procedure: ESOPHAGOGASTRODUODENOSCOPY (EGD);  Surgeon: Petra Kuba, MD;  Location: [facility] ENDOSCOPY;  Service: Endoscopy;  Laterality: N/A;  with carm  . Savory dilation  05/13/2012    Procedure: SAVORY DILATION;  Surgeon: Petra Kuba, MD;  Location: [facility] ENDOSCOPY;  Service: Endoscopy;  Laterality: N/A;   I have reviewed the FH and SH and  If appropriate update it with new information. Allergies  Allergen Reactions  . Aspirin Hives    High Doses   Scheduled Meds: . aspirin EC  325 mg Oral Daily  . azithromycin  500 mg Intravenous Q24H  . Chlorhexidine Gluconate Cloth  6 each Topical Q0600  . enoxaparin (LOVENOX) injection  30 mg Subcutaneous Q24H  . feeding supplement  237 mL Oral TID WC  . levothyroxine  75 mcg Oral QAC breakfast  . losartan  50 mg Oral Daily  . mupirocin ointment  1 application Nasal BID  . pantoprazole  40 mg Oral Daily  . pindolol  5 mg Oral BID  . potassium chloride  20 mEq Oral Daily  . senna-docusate  1 tablet Oral QHS  . cyanocobalamin  1,000 mcg Oral q morning - 10a   Continuous Infusions: . sodium chloride 125 mL/hr at 02/17/13 1500   PRN Meds:.acetaminophen, dextromethorphan-guaiFENesin,  ondansetron (ZOFRAN) IV, polyvinyl alcohol    BP 119/54  Pulse 83  Temp(Src) 98.4 F (36.9 C) (Oral)  Resp 15  Ht 5\' 5"  (1.651 m)  Wt 53.3 kg (117 lb 8.1 oz)  BMI 19.55 kg/m2  SpO2 98%   PPS: 50-60%   Intake/Output Summary (Last 24 hours) at 02/17/13 1645 Last data filed at 02/17/13 1500  Gross per 24 hour  Intake 3341.25 ml  Output   1825 ml  Net 1516.25 ml   LBM: 3 days ago                     Stool Softner: increased Senakot-S to daily HS  Physical Exam:  General: Alert, HOH , responses appropriate HEENT:  Buccal mucosa moist, no lesions Chest: CTA bilaterally, diminished at bases  CVS: RRR Abdomen: non-distended, BS audible Ext: no pedal edema Neuro: alert, responsive to questions, responses appropriate  Variable  0 Points 1 Point 2 Points Total  Heart rate per minute  <90 beats 90-109 beats >110 beats 1  Respiratory  Rate per minute < 18 breaths 19-30 breaths  >30 breaths 0  Restlessness; nonpurposeful movements None  occas slight movement Frequent movement 0  Paradoxical breathing pattern: None  Present 0  Accessory muscle use: rise in clavicle during inspiration None Slight rise Pronounced rise 0  Grunting at end-expiration: guttural sound None  Present 0  Nasal flaring: involuntary movement of nares None  Present 0  Look of fear None  Eyes wide 0  Overall total out of 16    1/16    Respiratory Distress Observation Scale Journal of Palliative Medicine Vol. 13, Number 3, 2010 Campbell et al. Page. 285-290  Labs: CBC    Component Value Date/Time   WBC 4.4 02/17/2013 0444   RBC 2.76* 02/17/2013 0444   HGB 8.6* 02/17/2013 0444   HCT 23.1* 02/17/2013 0444   PLT 201 02/17/2013 0444   MCV 83.7 02/17/2013 0444   MCH 31.2 02/17/2013 0444   MCHC 37.2* 02/17/2013 0444   RDW 13.5 02/17/2013 0444   LYMPHSABS 1.9 02/16/2013 1450   MONOABS 0.5 02/16/2013 1450   EOSABS 0.0 02/16/2013 1450   BASOSABS 0.0 02/16/2013 1450    BMET    Component Value Date/Time   NA 117* 02/17/2013  0444   K 3.7 02/17/2013 0444   CL 86* 02/17/2013 0444   CO2 25 02/17/2013 0444   GLUCOSE 94 02/17/2013 0444   BUN 11 02/17/2013 0444   CREATININE 0.68 02/17/2013 0444   CALCIUM 8.4 02/17/2013 0444   GFRNONAA 76* 02/17/2013 0444   GFRAA 89* 02/17/2013 0444    CMP     Component Value Date/Time   NA 117* 02/17/2013 0444   K 3.7 02/17/2013 0444   CL 86* 02/17/2013 0444   CO2 25 02/17/2013 0444   GLUCOSE 94 02/17/2013 0444   BUN 11 02/17/2013 0444   CREATININE 0.68 02/17/2013 0444   CALCIUM 8.4 02/17/2013 0444   PROT 7.2 02/16/2013 1450   ALBUMIN 4.0 02/16/2013 1450   AST 31 02/16/2013 1450   ALT 15 02/16/2013 1450   ALKPHOS 65 02/16/2013 1450   BILITOT 0.4 02/16/2013 1450   GFRNONAA 76* 02/17/2013 0444   GFRAA 89* 02/17/2013 0444    Chest Xray Reviewed/Impressions:02/16/13 IMPRESSION:  New patchy airspace disease in the medial right apex. Imaging  features are suspicious for pneumonia. Follow-up recommended to  ensure resolution.   Time In Time Out Total Time Spent with Patient Total Overall Time  3:30p 4:45p 45 min 45 min    Greater than 50%  of this time was spent counseling and coordinating care related to the above assessment and plan.  Freddie Breech, CNS-C Palliative Medicine Team [facility] Team Phone: [phone] Pager: [phone]

## 2013-02-17 NOTE — Progress Notes (Signed)
Thank you for consulting the Palliative Medicine Team at [facility] to meet your patient's and family's needs.   The reason that you asked [place] to see your patient is to re-visit goals of care  We have scheduled your patient for a meeting:  Today, Tuesday 02/17/13 @ 3:30 pm  The Surrogate decision maker is: daughter Charlestine Night c: [phone]  Other family members that need to be present: none, per daughter, she is primary caregiver and decision maker  Your patient is able/unable to participate: TBD  - per phone discussion with daughter, patient has been followed by Palliative Care Services, NP at [facility] with additional support from daughter and private hire cg; daughter stated she has discussed hospice options with PCS provider in the past but was not ready to transition to hospice at that point- given her mother's change in condition she wants to discuss options  Valente David, RN 02/17/2013, 12:37 PM Palliative Medicine Team RN Liaison [phone]

## 2013-02-17 NOTE — Progress Notes (Signed)
Pt tx 6N per MD order, pt VSS, family at [facility], pt and family verbalized understanding of tx, report called to receiving RN, all questions answered, pt currently having palliative care meeting in room will tx after meeting

## 2013-02-17 NOTE — Progress Notes (Signed)
Received from 3300, son at bedside, oriented to room and surroundings

## 2013-02-18 DIAGNOSIS — F29 Unspecified psychosis not due to a substance or known physiological condition: Secondary | ICD-10-CM

## 2013-02-18 DIAGNOSIS — E876 Hypokalemia: Secondary | ICD-10-CM

## 2013-02-18 LAB — BASIC METABOLIC PANEL
BUN: 9 mg/dL (ref 6–23)
Chloride: 97 mEq/L (ref 96–112)
GFR calc non Af Amer: 78 mL/min — ABNORMAL LOW (ref >90)
Glucose, Bld: 118 mg/dL — ABNORMAL HIGH (ref 70–99)
Potassium: 3.4 mEq/L — ABNORMAL LOW (ref 3.5–5.1)

## 2013-02-18 MED ORDER — POLYETHYLENE GLYCOL 3350 17 G PO PACK
17.0000 g | PACK | Freq: Every day | ORAL | Status: DC | PRN
  Administered 2013-02-19: 17 g via ORAL
  Filled 2013-02-18 (×2): qty 1

## 2013-02-18 MED ORDER — POTASSIUM CHLORIDE 20 MEQ PO PACK: 40.0000 meq | PACK | Freq: Once | ORAL | Status: DC

## 2013-02-18 MED ORDER — POTASSIUM CHLORIDE CRYS ER 20 MEQ PO TBCR
40.0000 meq | EXTENDED_RELEASE_TABLET | Freq: Once | ORAL | Status: AC
  Administered 2013-02-18: 40 meq via ORAL
  Filled 2013-02-18: qty 2

## 2013-02-18 NOTE — Evaluation (Signed)
Physical Therapy Evaluation Patient Details Name: Margaret [name] MRN: [id number] DOB: 1924/04/29 Today's Date: 02/18/2013 Time: 4098-1191 PT Time Calculation (min): 20 min  PT Assessment / Plan / Recommendation History of Present Illness  Pt admitted for lethargy and "not feeling like myself."  Clinical Impression  Pt functioning near baseline. Pt receives assist for showering 2 -3x/wk. Anticipate pt to be safe for d/c home to her apartment with HHPT and con't use of RW. Acute PT to follow to progress independence with mobility.    PT Assessment  Patient needs continued PT services    Follow Up Recommendations  Home health PT;Supervision - Intermittent    Does the patient have the potential to tolerate intense rehabilitation      Barriers to Discharge        Equipment Recommendations  None recommended by PT    Recommendations for Other Services     Frequency Min 3X/week    Precautions / Restrictions Precautions Precautions: Fall Restrictions Weight Bearing Restrictions: No   Pertinent Vitals/Pain Pt denies pain.      Mobility  Bed Mobility Bed Mobility: Not assessed Transfers Transfers: Stand to Sit;Sit to Stand Sit to Stand: 4: Min guard;With upper extremity assist;From bed Stand to Sit: 5: Supervision;With upper extremity assist;To chair/3-in-1 Details for Transfer Assistance: v/c's for hand placement Ambulation/Gait Ambulation/Gait Assistance: 4: Min guard Ambulation Distance (Feet): 125 Feet Assistive device: Rolling walker Ambulation/Gait Assistance Details: no episodes of LOB Gait Pattern: Step-through pattern;Decreased stride length Gait velocity: wfl Stairs: No    Exercises     PT Diagnosis: Generalized weakness  PT Problem List: Decreased activity tolerance PT Treatment Interventions: DME instruction;Gait training;Therapeutic exercise;Therapeutic activities;Functional mobility training     PT Goals(Current goals can be found in the care plan  section) Acute Rehab PT Goals Patient Stated Goal: back to apartment PT Goal Formulation: With patient Time For Goal Achievement: 03/04/13 Potential to Achieve Goals: Good  Visit Information  Last PT Received On: 02/18/13 Assistance Needed: +1 History of Present Illness: Pt admitted for lethargy and "not feeling like myself."       Prior Functioning  Home Living Family/patient expects to be discharged to:: Private residence Living Arrangements: Alone Available Help at Discharge: Available PRN/intermittently;Family;Available 24 hours/day;Other (Comment) Type of Home: Independent living facility (elderly apt) Home Access: Elevator Home Layout: One level Home Equipment: Walker - 2 wheels;Grab bars - tub/shower Prior Function Level of Independence: Needs assistance Gait / Transfers Assistance Needed: use RW ADL's / Homemaking Assistance Needed: hired help comes 2-3x/wk to assist with shower Communication / Swallowing Assistance Needed: no Communication Communication: No difficulties Dominant Hand: Right    Cognition  Cognition Arousal/Alertness: Awake/alert Behavior During Therapy: WFL for tasks assessed/performed Overall Cognitive Status: Within Functional Limits for tasks assessed    Extremity/Trunk Assessment Upper Extremity Assessment Upper Extremity Assessment: Generalized weakness Lower Extremity Assessment Lower Extremity Assessment: Generalized weakness Cervical / Trunk Assessment Cervical / Trunk Assessment: Kyphotic   Balance    End of Session PT - End of Session Equipment Utilized During Treatment: Gait belt Activity Tolerance: Patient tolerated treatment well Patient left: in chair;with call bell/phone within reach Nurse Communication: Mobility status  GP     Marcene Brawn 02/18/2013, 3:51 PM  Lewis Shock, PT, DPT Pager #: [phone] Office #: [phone]

## 2013-02-18 NOTE — Care Management Note (Signed)
  Page 2 of 2   02/20/2013     3:42:56 PM   CARE MANAGEMENT NOTE 02/20/2013  Patient:  Margaret [name], Margaret [name]   Account Number:  [id number]  Date Initiated:  02/17/2013  Documentation initiated by:  Donn Pierini  Subjective/Objective Assessment:   Pt admitted with hyponatremia and PNA     Action/Plan:   PTA pt lived at home   Anticipated DC Date:  02/20/2013   Anticipated DC Plan:  HOME W HOSPICE CARE      DC Planning Services  CM consult      Choice offered to / List presented to:          [facility] arranged  HH-2 PT  HH-1 RN      [facility] agency  [facility]   Status of service:  Completed, signed off Medicare Important Message given?   (If response is "NO", the following Medicare IM given date fields will be blank) Date Medicare IM given:   Date Additional Medicare IM given:    Discharge Disposition:    Per UR Regulation:  Reviewed for med. necessity/level of care/duration of stay  If discussed at Long Length of Stay Meetings, dates discussed:    Comments:  Margaret [name] c: [phone] ( cell ) , work [phone]   02-20-13 Fax discharge summary to  [facility] . Confirmed with [place] patient discharging today . Ronny Flurry RN BSN  02-19-13 Joni Reining at [facility] [phone]) called back , orders are needed .  Orders for palliative care and home health PT entered . Spoke with Margaret [name] c: [phone] she would like [facility] ([phone] , fax [phone] ) referral given to [place] , and Home health with Genevieve Norlander ( referral given to [facility] )   Ronny Flurry RN BSN [phone]    02-19-13  Recommendations for patient to return to independent living at [facility] ( [phone]) at discharge with palliative care services following and HHPT .  Spoke with Joni Reining at [facility] and was instructed no orders needed , MD just needs to include recommendations for palliative care and PT in discharge summary and both will be provided  to patient. Dr Benjamine Mola aware .  Awaiting call back from patient's daughter.  Ronny Flurry RN BSN [phone]    02-18-13 Patient lives at [facility] facility, had palliative care services of [place] following her at residence. Daughter and son-in-law primary caregivers, also have paid CNA assistance   02-18-13 Per notes patient  from independent living with sitters and palliative care following . Spoke with patient who gave permission for NCM to call her [name] [phone] for more information . Same done and voice mail left . MD has also ordered PT / OT will await recommendations and daughter's call back.  Ronny Flurry RN BSN [phone]

## 2013-02-18 NOTE — Progress Notes (Signed)
Pt complained of chest pain; observed to be anxious upon waking and she was shivering and stated she was cold. Vital signs taken and were within normal range, no SOB or respiratory distress noted. NP on call paged and notified of event and ordered STAT EKG. Will continue to monitor.

## 2013-02-18 NOTE — Progress Notes (Signed)
[facility] PROGRESS NOTE  Margaret [name]:[id number] DOB: February 18, 1924 DOA: 02/16/2013 PCP: Michele Mcalpine, MD  Assessment/Plan: Hyponatremia/chronic recurrent  -improving with hydration   Parkinson's disease/Senile dementia, uncomplicated  -slowly progressive and dtr notes ST memory has worsened over the past few months  -Dtr requests to meet with IP Palliative team again   HYPERTENSION  -has isolated systolic HTN  -avoid overcorrection  -cont ARB   Dysphagia/recurrent aspiration pneumonia  -cont Zmax and supportive care-dc Zosyn  -D1 diet and allow appropriate foods/liquids from home  -cont PPI   Chronic diastolic CHF (congestive heart failure)  -compensated   ANEMIA  -Hgb stable   Hypothyroidism  -cont Synthroid  Hypokalemia -replete  Code Status: DNR Family Communication: patient at bedside Disposition Plan: pt eval   Consultants:  palliative care  Procedures:    Antibiotics:  azithromycin  HPI/Subjective: Eating well, feeling better  Objective: Filed Vitals:   02/17/13 1708 02/17/13 2234 02/18/13 0500 02/18/13 0554  BP: 138/77 138/85  155/66  Pulse: 95 81  85  Temp: 98.3 F (36.8 C) 97.9 F (36.6 C)  97.6 F (36.4 C)  TempSrc: Oral Oral  Oral  Resp: 14     Height:      Weight:   54.1 kg (119 lb 4.3 oz)   SpO2: 100% 98%  100%    Intake/Output Summary (Last 24 hours) at 02/18/13 1040 Last data filed at 02/18/13 0554  Gross per 24 hour  Intake   1360 ml  Output   3475 ml  Net  -2115 ml   Filed Weights   02/16/13 1424 02/17/13 0553 02/18/13 0500  Weight: 47.628 kg (105 lb) 53.3 kg (117 lb 8.1 oz) 54.1 kg (119 lb 4.3 oz)    Exam:   General:  A+Ox3, chronically ill appearing  Cardiovascular: rrr  Respiratory: clear anterior  Abdomen: +BS, soft  Musculoskeletal: moves all 4 ext   Data Reviewed: Basic Metabolic Panel:  Recent Labs Lab 02/16/13 1450 02/16/13 2020 02/17/13 0444 02/18/13 0705  NA 114* 112* 117*  129*  K 4.4 4.0 3.7 3.4*  CL 78* 80* 86* 97  CO2 27 25 25 26   GLUCOSE 119* 119* 94 118*  BUN 17 15 11 9   CREATININE 0.79 0.68 0.68 0.64  CALCIUM 9.4 8.4 8.4 8.8   Liver Function Tests:  Recent Labs Lab 02/16/13 1450  AST 31  ALT 15  ALKPHOS 65  BILITOT 0.4  PROT 7.2  ALBUMIN 4.0    Recent Labs Lab 02/16/13 1450  LIPASE 60*   No results found for this basename: AMMONIA,  in the last 168 hours CBC:  Recent Labs Lab 02/16/13 1450 02/17/13 0444  WBC 4.7 4.4  NEUTROABS 2.2  --   HGB 9.7* 8.6*  HCT 25.9* 23.1*  MCV 82.2 83.7  PLT 215 201   Cardiac Enzymes:  Recent Labs Lab 02/18/13 0916  TROPONINI <0.30   BNP (last 3 results)  Recent Labs  08/14/12 1134  PROBNP 1779.0*   CBG: No results found for this basename: GLUCAP,  in the last 168 hours  Recent Results (from the past 240 hour(s))  MRSA PCR SCREENING     Status: Abnormal   Collection Time    02/16/13  5:12 PM      Result Value Range Status   MRSA by PCR POSITIVE (*) NEGATIVE Final   Comment:            The GeneXpert MRSA Assay (FDA     approved for  NASAL specimens     only), is one component of a     comprehensive MRSA colonization     surveillance program. It is not     intended to diagnose MRSA     infection nor to guide or     monitor treatment for     MRSA infections.     RESULT CALLED TO, READ BACK BY AND VERIFIED WITH:     Adalberto Ill RN 2113 02/16/13 A BROWNING     Studies: Dg Chest 2 View  02/16/2013   *RADIOLOGY REPORT*  Clinical Data: Left-sided chest pain  CHEST - 2 VIEW  Comparison: 11/19/2012  Findings: Two-view exam shows emphysema.  There is some new patchy airspace disease in the right apex. The cardiopericardial silhouette is enlarged. Bones are diffusely demineralized.  IMPRESSION: New patchy airspace disease in the medial right apex.  Imaging features are suspicious for pneumonia.  Follow-up recommended to ensure resolution.   Original Report Authenticated By: Kennith Center, M.D.     Scheduled Meds: . aspirin EC  325 mg Oral Daily  . azithromycin  500 mg Intravenous Q24H  . Chlorhexidine Gluconate Cloth  6 each Topical Q0600  . enoxaparin (LOVENOX) injection  30 mg Subcutaneous Q24H  . feeding supplement  237 mL Oral TID WC  . levothyroxine  75 mcg Oral QAC breakfast  . losartan  50 mg Oral Daily  . mupirocin ointment  1 application Nasal BID  . pantoprazole  40 mg Oral Daily  . pindolol  5 mg Oral BID  . potassium chloride  40 mEq Oral Once  . senna-docusate  1 tablet Oral QHS  . cyanocobalamin  1,000 mcg Oral q morning - 10a   Continuous Infusions: . sodium chloride 125 mL/hr at 02/17/13 2004    Active Problems:   ANEMIA   HYPERTENSION   Hypothyroidism   Hyponatremia   Dysphagia   Chronic diastolic CHF (congestive heart failure)   Parkinson's disease   Senile dementia, uncomplicated   Palliative care encounter    Time spent: 35    [facility], JESSICA  [facility] Pager [phone]. If 7PM-7AM, please contact night-coverage at [email or web address], password [facility] 02/18/2013, 10:40 AM  LOS: 2 days

## 2013-02-18 NOTE — Progress Notes (Signed)
Chaplain Note:  Chaplain visited with pt who was sitting on the edge of her bed, finishing her meal.  Pt was alert and fully oriented.  Chaplain provided spiritual comfort, support, and prayer for pt.  Pt stated that she was well supported by family and clergy.  Pt appreciated chaplain support.  Chaplain will follow up as needed.  02/18/13 1400  Clinical Encounter Type  Visited With Patient  Visit Type Spiritual support  Referral From Palliative care team  Spiritual Encounters  Spiritual Needs Prayer;Emotional  Stress Factors  Patient Stress Factors Health changes  Family Stress Factors Not reviewed (No family present)  Verdie Shire, Chaplain [phone]

## 2013-02-19 LAB — BASIC METABOLIC PANEL
BUN: 10 mg/dL (ref 6–23)
Chloride: 96 mEq/L (ref 96–112)
Glucose, Bld: 91 mg/dL (ref 70–99)
Potassium: 4.5 mEq/L (ref 3.5–5.1)

## 2013-02-19 MED ORDER — AZITHROMYCIN 500 MG PO TABS
500.0000 mg | ORAL_TABLET | Freq: Every day | ORAL | Status: DC
  Administered 2013-02-19: 500 mg via ORAL
  Filled 2013-02-19 (×2): qty 1

## 2013-02-19 NOTE — Progress Notes (Signed)
[facility] PROGRESS NOTE  Margaret [name] ZOX:[id number] DOB: 05-02-1924 DOA: 02/16/2013 PCP: Michele Mcalpine, MD  Assessment/Plan: Hyponatremia/chronic recurrent  -improving with hydration  -d/c IVF since patient seems to be eating and drinking well  Parkinson's disease/Senile dementia, uncomplicated  -slowly progressive and dtr notes ST memory has worsened over the past few months  -Dtr requests to meet with IP Palliative team again   HYPERTENSION  -has isolated systolic HTN  -avoid overcorrection  -cont ARB   Dysphagia/recurrent aspiration pneumonia  -cont Zmax and supportive care-dc Zosyn  -D1 diet and allow appropriate foods/liquids from home  -cont PPI   Chronic diastolic CHF (congestive heart failure)  -compensated   ANEMIA  -Hgb stable   Hypothyroidism  -cont Synthroid  Hypokalemia -replete  -d/c foley  Code Status: DNR Family Communication: patient at bedside Disposition Plan: home in AM   Consultants:  palliative care  Procedures:    Antibiotics:  azithromycin  HPI/Subjective: Doing well No SOB, no CP, chronic trouble swallowing  Objective: Filed Vitals:   02/18/13 0554 02/18/13 1101 02/18/13 1338 02/18/13 2211  BP: 155/66  135/72 145/53  Pulse: 85  86 73  Temp: 97.6 F (36.4 C)  97.8 F (36.6 C) 97.7 F (36.5 C)  TempSrc: Oral  Oral Oral  Resp:      Height:      Weight:  56.5 kg (124 lb 9 oz)    SpO2: 100%  100% 97%    Intake/Output Summary (Last 24 hours) at 02/19/13 1015 Last data filed at 02/19/13 0648  Gross per 24 hour  Intake 4532.5 ml  Output   1650 ml  Net 2882.5 ml   Filed Weights   02/17/13 0553 02/18/13 0500 02/18/13 1101  Weight: 53.3 kg (117 lb 8.1 oz) 54.1 kg (119 lb 4.3 oz) 56.5 kg (124 lb 9 oz)    Exam:   General:  A+Ox3, chronically ill appearing  Cardiovascular: rrr  Respiratory: clear anterior  Abdomen: +BS, soft  Musculoskeletal: moves all 4 ext   Data Reviewed: Basic Metabolic  Panel:  Recent Labs Lab 02/16/13 1450 02/16/13 2020 02/17/13 0444 02/18/13 0705 02/19/13 0505  NA 114* 112* 117* 129* 129*  K 4.4 4.0 3.7 3.4* 4.5  CL 78* 80* 86* 97 96  CO2 27 25 25 26 24   GLUCOSE 119* 119* 94 118* 91  BUN 17 15 11 9 10   CREATININE 0.79 0.68 0.68 0.64 0.59  CALCIUM 9.4 8.4 8.4 8.8 8.8   Liver Function Tests:  Recent Labs Lab 02/16/13 1450  AST 31  ALT 15  ALKPHOS 65  BILITOT 0.4  PROT 7.2  ALBUMIN 4.0    Recent Labs Lab 02/16/13 1450  LIPASE 60*   No results found for this basename: AMMONIA,  in the last 168 hours CBC:  Recent Labs Lab 02/16/13 1450 02/17/13 0444  WBC 4.7 4.4  NEUTROABS 2.2  --   HGB 9.7* 8.6*  HCT 25.9* 23.1*  MCV 82.2 83.7  PLT 215 201   Cardiac Enzymes:  Recent Labs Lab 02/18/13 0916  TROPONINI <0.30   BNP (last 3 results)  Recent Labs  08/14/12 1134  PROBNP 1779.0*   CBG: No results found for this basename: GLUCAP,  in the last 168 hours  Recent Results (from the past 240 hour(s))  MRSA PCR SCREENING     Status: Abnormal   Collection Time    02/16/13  5:12 PM      Result Value Range Status   MRSA by PCR  POSITIVE (*) NEGATIVE Final   Comment:            The GeneXpert MRSA Assay (FDA     approved for NASAL specimens     only), is one component of a     comprehensive MRSA colonization     surveillance program. It is not     intended to diagnose MRSA     infection nor to guide or     monitor treatment for     MRSA infections.     RESULT CALLED TO, READ BACK BY AND VERIFIED WITH:     Adalberto Ill RN 2113 02/16/13 A BROWNING     Studies: No results found.  Scheduled Meds: . aspirin EC  325 mg Oral Daily  . azithromycin  500 mg Oral q1800  . Chlorhexidine Gluconate Cloth  6 each Topical Q0600  . enoxaparin (LOVENOX) injection  30 mg Subcutaneous Q24H  . feeding supplement  237 mL Oral TID WC  . levothyroxine  75 mcg Oral QAC breakfast  . losartan  50 mg Oral Daily  . mupirocin ointment  1  application Nasal BID  . pantoprazole  40 mg Oral Daily  . pindolol  5 mg Oral BID  . senna-docusate  1 tablet Oral QHS  . cyanocobalamin  1,000 mcg Oral q morning - 10a   Continuous Infusions:    Active Problems:   ANEMIA   HYPERTENSION   Hypothyroidism   Hyponatremia   Dysphagia   Chronic diastolic CHF (congestive heart failure)   Parkinson's disease   Senile dementia, uncomplicated   Palliative care encounter    Time spent: 35    [facility], Juancarlos Crescenzo  [facility] Pager [phone]. If 7PM-7AM, please contact night-coverage at [email or web address], password [facility] 02/19/2013, 10:15 AM  LOS: 3 days

## 2013-02-19 NOTE — Progress Notes (Signed)
Physical Therapy Treatment Patient Details Name: Margaret [name] MRN: [id number] DOB: 1923-11-13 Today's Date: 02/19/2013 Time: 4098-1191 PT Time Calculation (min): 24 min  PT Assessment / Plan / Recommendation  PT Comments   Pt able to increase ambulation distance however continues to need minguard for safety.  Pt with increase (A) needed with sit <> stand after several reps due to fatigue.    Follow Up Recommendations  Home health PT;Supervision - Intermittent     Equipment Recommendations  None recommended by PT    Frequency Min 3X/week   Progress towards PT Goals Progress towards PT goals: Progressing toward goals  Plan Current plan remains appropriate    Precautions / Restrictions Precautions Precautions: Fall Restrictions Weight Bearing Restrictions: No   Pertinent Vitals/Pain No c/o pain    Mobility  Bed Mobility Bed Mobility: Not assessed Transfers Transfers: Stand to Sit;Sit to Stand Sit to Stand: 4: Min guard;With upper extremity assist;From bed;4: Min assist Stand to Sit: 4: Min guard Details for Transfer Assistance: Increase assistance needed with transfers after several reps due to fatigue Ambulation/Gait Ambulation/Gait Assistance: 4: Min guard Ambulation Distance (Feet): 200 Feet Assistive device: Rolling walker Ambulation/Gait Assistance Details: Minguard for safety with cues for RW placement Gait Pattern: Step-through pattern;Decreased stride length Stairs: No    Exercises General Exercises - Lower Extremity Long Arc Quad: Strengthening;Both;10 reps Straight Leg Raises: Strengthening;Both;10 reps Hip Flexion/Marching: Strengthening;Both;10 reps;Seated Mini-Sqauts: Strengthening;5 reps   PT Diagnosis:    PT Problem List:   PT Treatment Interventions:     PT Goals (current goals can now be found in the care plan section) Acute Rehab PT Goals Patient Stated Goal: back to apartment PT Goal Formulation: With patient Time For Goal Achievement:  03/04/13 Potential to Achieve Goals: Good  Visit Information  Last PT Received On: 02/19/13 Assistance Needed: +1 History of Present Illness: Pt admitted for lethargy and "not feeling like myself."  Found to have PNA & hyponatremia    Subjective Data  Subjective: "I can't do those mini squats well." Patient Stated Goal: back to apartment   Cognition  Cognition Arousal/Alertness: Awake/alert Behavior During Therapy: WFL for tasks assessed/performed Overall Cognitive Status: Within Functional Limits for tasks assessed    Balance     End of Session PT - End of Session Equipment Utilized During Treatment: Gait belt Activity Tolerance: Patient tolerated treatment well Patient left: in chair;with call bell/phone within reach Nurse Communication: Mobility status   GP     Indiah Heyden 02/19/2013, 4:42 PM Jake Shark, PT DPT [phone]

## 2013-02-20 ENCOUNTER — Telehealth: Payer: Self-pay | Admitting: Pulmonary Disease

## 2013-02-20 LAB — BASIC METABOLIC PANEL
BUN: 10 mg/dL (ref 6–23)
CO2: 28 mEq/L (ref 19–32)
Chloride: 94 mEq/L — ABNORMAL LOW (ref 96–112)
Creatinine, Ser: 0.54 mg/dL (ref 0.50–1.10)
Glucose, Bld: 92 mg/dL (ref 70–99)

## 2013-02-20 MED ORDER — AZITHROMYCIN 500 MG PO TABS: 500.0000 mg | ORAL_TABLET | Freq: Every day | ORAL | Status: DC

## 2013-02-20 NOTE — Discharge Summary (Signed)
Physician Discharge Summary  Margaret [name] ZOX:[id number] DOB: 1923-12-01 DOA: 02/16/2013  PCP: Michele Mcalpine, MD  Admit date: 02/16/2013 Discharge date: 02/20/2013  Time spent: 35 minutes  Recommendations for Outpatient Follow-up:  BMP soon for NA pallaitive care/home health  Discharge Diagnoses:  Active Problems:   ANEMIA   HYPERTENSION   Hypothyroidism   Hyponatremia   Dysphagia   Chronic diastolic CHF (congestive heart failure)   Parkinson's disease   Senile dementia, uncomplicated   Palliative care encounter   Discharge Condition: improved  Diet recommendation: DYS  Filed Weights   02/18/13 0500 02/18/13 1101 02/20/13 0641  Weight: 54.1 kg (119 lb 4.3 oz) 56.5 kg (124 lb 9 oz) 57.1 kg (125 lb 14.1 oz)    History of present illness:  Margaret [name] is a 80 y.o. female with h/o Dementia, CVA, UTIs, Chronic hyponatremia, Severe Dysphagia with recurrent aspiration pneumonia who is followed by palliative care services at home is brought to the ER by her daughter with the above complaint.  Pts daughter reports that she was treated for a UTI 7 days ago with PO levaquin which she completed today.  However for the past 7-10 days has been fatigued, more confused than usual, intermittently hallucinating, sleeping a lot.  In addition also has been getting short of breath with little activity and having a congested cough for 4-5days.  Upon evaluation in ER noted to have a worsening of her hyponatremia with sodium of 114, CXR with new R lung air space disease.  Of note, during last hospitalization in 3/14 for aspiration pneumonia and severe dysphagia had a palliative care and speech evaluation at the time and decision was made for DNR, no feeding tube and supportive care and treatment with IVF, abx only and is being followed by [facility] since then.   Hospital Course:  Hyponatremia/chronic recurrent  -improving with hydration  -d/c IVF since patient is eating and drinking well    Parkinson's disease/Senile dementia, uncomplicated  -slowly progressive and dtr notes ST memory has worsened over the past few months  -Dtr requests to meet with [facility] Palliative team again   HYPERTENSION  -has isolated systolic HTN  -avoid overcorrection  -cont ARB   Dysphagia/recurrent aspiration pneumonia  -cont Zmax and supportive care-dc Zosyn  -D1 diet and allow appropriate foods/liquids from home  -cont PPI   Chronic diastolic CHF (congestive heart failure)  -compensated   ANEMIA  -Hgb stable   Hypothyroidism  -cont Synthroid   Hypokalemia  -replete    Discharge Exam: Filed Vitals:   02/19/13 2143 02/20/13 0547 02/20/13 0641 02/20/13 1433  BP: 149/57 156/57  142/72  Pulse: 70 71  72  Temp: 97.5 F (36.4 C) 97.7 F (36.5 C)  97.7 F (36.5 C)  TempSrc: Oral     Resp: 17 17  18   Height:      Weight:   57.1 kg (125 lb 14.1 oz)   SpO2: 100% 99%  99%    General: pleasant/cooperative Cardiovascular: rrr Respiratory: clear anterior  Discharge Instructions      Discharge Orders   Future Orders Complete By Expires     Discharge instructions  As directed     Comments:      palliative care to continue to follow as well as home health PT Resume DYS diet BMP in 2 weeks    Increase activity slowly  As directed         Medication List    STOP taking these medications  levofloxacin 500 MG tablet  Commonly known as:  LEVAQUIN     potassium chloride SA 20 MEQ tablet  Commonly known as:  K-DUR,KLOR-CON      TAKE these medications       aspirin EC 325 MG tablet  Take 1 tablet (325 mg total) by mouth daily.     azithromycin 500 MG tablet  Commonly known as:  ZITHROMAX  Take 1 tablet (500 mg total) by mouth daily at 6 PM.     cyanocobalamin 1000 MCG tablet  Take 1,000 mcg by mouth every morning.     feeding supplement Liqd  Take 237 mLs by mouth daily.     lansoprazole 30 MG capsule  Commonly known as:  PREVACID  Take 30 mg by mouth  daily.     levothyroxine 75 MCG tablet  Commonly known as:  SYNTHROID, LEVOTHROID  Take 75 mcg by mouth every morning.     losartan 50 MG tablet  Commonly known as:  COZAAR  Take 1 tablet (50 mg total) by mouth daily.     methylcellulose 1 % ophthalmic solution  Commonly known as:  ARTIFICIAL TEARS  Place 1 drop into both eyes as needed.     pindolol 5 MG tablet  Commonly known as:  VISKEN  Take 5 mg by mouth 2 (two) times daily.     senna-docusate 8.6-50 MG per tablet  Commonly known as:  Senokot-S  Take 1-2 tablet by mouth at bedtime       Allergies  Allergen Reactions  . Aspirin Hives    High Doses   Follow-up Information   Follow up with NADEL,SCOTT M, MD In 1 week.   Contact information:   [address] [place] [address] [phone]        The results of significant diagnostics from this hospitalization (including imaging, microbiology, ancillary and laboratory) are listed below for reference.    Significant Diagnostic Studies: Dg Chest 2 View  02/16/2013   *RADIOLOGY REPORT*  Clinical Data: Left-sided chest pain  CHEST - 2 VIEW  Comparison: 11/19/2012  Findings: Two-view exam shows emphysema.  There is some new patchy airspace disease in the right apex. The cardiopericardial silhouette is enlarged. Bones are diffusely demineralized.  IMPRESSION: New patchy airspace disease in the medial right apex.  Imaging features are suspicious for pneumonia.  Follow-up recommended to ensure resolution.   Original Report Authenticated By: Kennith Center, M.D.    Microbiology: Recent Results (from the past 240 hour(s))  MRSA PCR SCREENING     Status: Abnormal   Collection Time    02/16/13  5:12 PM      Result Value Range Status   MRSA by PCR POSITIVE (*) NEGATIVE Final   Comment:            The GeneXpert MRSA Assay (FDA     approved for NASAL specimens     only), is one component of a     comprehensive MRSA colonization     surveillance program. It is not      intended to diagnose MRSA     infection nor to guide or     monitor treatment for     MRSA infections.     RESULT CALLED TO, READ BACK BY AND VERIFIED WITH:     Adalberto Ill RN 2113 02/16/13 A BROWNING     Labs: Basic Metabolic Panel:  Recent Labs Lab 02/16/13 2020 02/17/13 0444 02/18/13 0705 02/19/13 0505 02/20/13 0410  NA 112*  117* 129* 129* 127*  K 4.0 3.7 3.4* 4.5 4.4  CL 80* 86* 97 96 94*  CO2 25 25 26 24 28   GLUCOSE 119* 94 118* 91 92  BUN 15 11 9 10 10   CREATININE 0.68 0.68 0.64 0.59 0.54  CALCIUM 8.4 8.4 8.8 8.8 9.0   Liver Function Tests:  Recent Labs Lab 02/16/13 1450  AST 31  ALT 15  ALKPHOS 65  BILITOT 0.4  PROT 7.2  ALBUMIN 4.0    Recent Labs Lab 02/16/13 1450  LIPASE 60*   No results found for this basename: AMMONIA,  in the last 168 hours CBC:  Recent Labs Lab 02/16/13 1450 02/17/13 0444  WBC 4.7 4.4  NEUTROABS 2.2  --   HGB 9.7* 8.6*  HCT 25.9* 23.1*  MCV 82.2 83.7  PLT 215 201   Cardiac Enzymes:  Recent Labs Lab 02/18/13 0916  TROPONINI <0.30   BNP: BNP (last 3 results)  Recent Labs  08/14/12 1134  PROBNP 1779.0*   CBG: No results found for this basename: GLUCAP,  in the last 168 hours     Signed:  Benjamine Mola, Juno Alers  [facility] 02/20/2013, 3:07 PM

## 2013-02-20 NOTE — Progress Notes (Signed)
Gave patient's daughter discharge instructions and she denies any questions or concerns.  IV access removed and site clean, dry and intact cannula intact.  No redness, bruising, drainage or bleeding. Rx given to daughter. Pt to be discharged per wheelchair to home.

## 2013-02-20 NOTE — Progress Notes (Signed)
Physical Therapy Treatment Patient Details Name: Margaret [name] MRN: [id number] DOB: [birth year] Today's Date: 02/20/2013 Time: 4098-1191 PT Time Calculation (min): 23 min  PT Assessment / Plan / Recommendation  PT Comments   Pt may need a few days of more supervision than usual from her daughter until pt and daughter are confident that she can be alone.    Follow Up Recommendations  Home health PT;Supervision - Intermittent     Does the patient have the potential to tolerate intense rehabilitation     Barriers to Discharge        Equipment Recommendations  None recommended by PT    Recommendations for Other Services    Frequency Min 3X/week   Progress towards PT Goals Progress towards PT goals: Progressing toward goals  Plan Current plan remains appropriate    Precautions / Restrictions Precautions Precautions: Fall Restrictions Weight Bearing Restrictions: No   Pertinent Vitals/Pain     Mobility  Bed Mobility Bed Mobility: Not assessed Transfers Transfers: Stand to Sit;Sit to Stand Sit to Stand: 5: Supervision Stand to Sit: 5: Supervision Details for Transfer Assistance: closer by due to descent not very controlled Ambulation/Gait Ambulation/Gait Assistance: 5: Supervision Ambulation Distance (Feet): 500 Feet Assistive device: Rolling walker Ambulation/Gait Assistance Details: still a little unsteady when not fully focused on the task of walking Gait Pattern: Step-through pattern;Decreased step length - right;Decreased step length - left;Decreased stride length Stairs: Yes Stairs Assistance: 4: Min assist Stairs Assistance Details (indicate cue type and reason): very fearful so just did one step Stair Management Technique: One rail Left;Step to pattern;Sideways Number of Stairs: 1 Wheelchair Mobility Wheelchair Mobility: No    Exercises General Exercises - Lower Extremity Long Arc Quad: Strengthening;Both;10 reps;Seated Hip ABduction/ADduction: AROM;Both;10  reps;Standing Hip Flexion/Marching: AROM;Both;15 reps;Seated Toe Raises: AROM;Both;15 reps;Seated   PT Diagnosis:    PT Problem List:   PT Treatment Interventions:     PT Goals (current goals can now be found in the care plan section) Acute Rehab PT Goals PT Goal Formulation: With patient Time For Goal Achievement: 03/04/13 Potential to Achieve Goals: Good  Visit Information  Last PT Received On: 02/20/13 Assistance Needed: +1 History of Present Illness: Pt admitted for lethargy and "not feeling like myself."  Found to have PNA & hyponatremia    Subjective Data  Subjective: :You,ve worn me out with these exercises   Cognition  Cognition Arousal/Alertness: Awake/alert Behavior During Therapy: WFL for tasks assessed/performed Overall Cognitive Status: Within Functional Limits for tasks assessed    Balance  Balance Balance Assessed: No  End of Session PT - End of Session Activity Tolerance: Patient tolerated treatment well Patient left: in chair;with call bell/phone within reach Nurse Communication: Mobility status   GP     Marili Vader, Eliseo Gum 02/20/2013, 10:40 AM 02/20/2013  Dorchester Bing, PT [phone]  (pager)

## 2013-02-20 NOTE — Telephone Encounter (Signed)
Order has been faxed to the fax # given to cont pallative care. Nothing further is needed.

## 2013-02-23 ENCOUNTER — Telehealth: Payer: Self-pay | Admitting: Pulmonary Disease

## 2013-02-23 NOTE — Telephone Encounter (Signed)
Per SN: ok to give VO as requested.  -----  Margaret [name] at the # provided.  Line rang several times with no answer and no option to leave msg.  WCB.

## 2013-02-23 NOTE — Telephone Encounter (Signed)
Patient is now able to qualify for a aide come to her home twice a week to help bath,change sheets on her bed, etc Verbal order needed which will be good for 6 months Will type of plan of care, fax it to our office and Dr. Kriste Basque will need to sign and fax back Dr. Kriste Basque please advise if verbal order can be given, thank you!!  Last OV 09/03/12 w 4 week follow not scheduled

## 2013-02-24 NOTE — Telephone Encounter (Signed)
ATC Michelle. NA no VM. WCB

## 2013-02-25 NOTE — Telephone Encounter (Signed)
ATC Michelle. NO VM line rang several times and no response. WCB

## 2013-02-25 NOTE — Telephone Encounter (Signed)
ATC michelle line rang several times w/ no answer and no VM wcb

## 2013-02-25 NOTE — Consult Note (Signed)
I have reviewed and discussed the care of this patient in detail with the nurse practitioner including pertinent patient records, physical exam findings and data. I agree with details of this encounter.  

## 2013-02-26 NOTE — Telephone Encounter (Signed)
ATC, NA and no option to leave a msg, WCB 

## 2013-02-27 NOTE — Telephone Encounter (Signed)
Called, spoke with [place].  Gave VO as ok'ed below per SN.  Marcelino Duster verbalized understanding and voiced no further questions or concerns at this time.

## 2013-03-04 ENCOUNTER — Other Ambulatory Visit: Payer: Self-pay | Admitting: Internal Medicine

## 2013-03-06 ENCOUNTER — Other Ambulatory Visit (INDEPENDENT_AMBULATORY_CARE_PROVIDER_SITE_OTHER): Payer: Medicare Other

## 2013-03-06 ENCOUNTER — Ambulatory Visit (INDEPENDENT_AMBULATORY_CARE_PROVIDER_SITE_OTHER): Payer: Medicare Other | Admitting: Adult Health

## 2013-03-06 ENCOUNTER — Encounter: Payer: Self-pay | Admitting: *Deleted

## 2013-03-06 ENCOUNTER — Ambulatory Visit (INDEPENDENT_AMBULATORY_CARE_PROVIDER_SITE_OTHER)
Admission: RE | Admit: 2013-03-06 | Discharge: 2013-03-06 | Disposition: A | Discharge status: Home / Self Care | Payer: Medicare Other | Source: Ambulatory Visit | Attending: Adult Health | Admitting: Adult Health

## 2013-03-06 ENCOUNTER — Encounter: Payer: Self-pay | Admitting: Adult Health

## 2013-03-06 VITALS — BP 128/74 | HR 78 | Temp 97.8°F | Ht 64.25 in | Wt 118.8 lb

## 2013-03-06 DIAGNOSIS — I10 Essential (primary) hypertension: Secondary | ICD-10-CM

## 2013-03-06 DIAGNOSIS — E871 Hypo-osmolality and hyponatremia: Secondary | ICD-10-CM

## 2013-03-06 DIAGNOSIS — E039 Hypothyroidism, unspecified: Secondary | ICD-10-CM

## 2013-03-06 DIAGNOSIS — D649 Anemia, unspecified: Secondary | ICD-10-CM

## 2013-03-06 DIAGNOSIS — J449 Chronic obstructive pulmonary disease, unspecified: Secondary | ICD-10-CM

## 2013-03-06 DIAGNOSIS — J690 Pneumonitis due to inhalation of food and vomit: Secondary | ICD-10-CM

## 2013-03-06 LAB — CBC WITH DIFFERENTIAL/PLATELET
Basophils Relative: 0.2 % (ref 0.0–3.0)
Eosinophils Absolute: 0.1 10*3/uL (ref 0.0–0.7)
Hemoglobin: 9.5 g/dL — ABNORMAL LOW (ref 12.0–15.0)
MCHC: 34.7 g/dL (ref 30.0–36.0)
MCV: 92.5 fl (ref 78.0–100.0)
Monocytes Absolute: 0.5 10*3/uL (ref 0.1–1.0)
Neutro Abs: 1.2 10*3/uL — ABNORMAL LOW (ref 1.4–7.7)
RBC: 2.96 Mil/uL — ABNORMAL LOW (ref 3.87–5.11)
RDW: 15 % — ABNORMAL HIGH (ref 11.5–14.6)

## 2013-03-06 LAB — HEPATIC FUNCTION PANEL
ALT: 21 U/L (ref 0–35)
Alkaline Phosphatase: 58 U/L (ref 39–117)
Bilirubin, Direct: 0 mg/dL (ref 0.0–0.3)
Total Bilirubin: 0.4 mg/dL (ref 0.3–1.2)

## 2013-03-06 LAB — BASIC METABOLIC PANEL
Chloride: 89 mEq/L — ABNORMAL LOW (ref 96–112)
Creatinine, Ser: 0.7 mg/dL (ref 0.4–1.2)
Sodium: 125 mEq/L — ABNORMAL LOW (ref 135–145)

## 2013-03-06 NOTE — Patient Instructions (Addendum)
On current regimen. Will call with chest x-ray and lab results. Follow up Dr. Kriste Basque, in 3 months and As needed   Please contact office for sooner follow up if symptoms do not improve or worsen or seek emergency care

## 2013-03-08 NOTE — Telephone Encounter (Signed)
Would send to Dr Kriste Basque to refill If he does not patient would need to have appt with me prior as she has not been seen since 2012.

## 2013-03-09 NOTE — Telephone Encounter (Signed)
I have not seen patient for over 1 year Either make appt with me or if out of med see if PCP can refill.

## 2013-03-10 NOTE — Telephone Encounter (Signed)
Patient will need f/u in cardiology prior to refill or PCP can refill.

## 2013-03-10 NOTE — Telephone Encounter (Signed)
Will forward to Dr Lennon Alstrom and [place] Bailiff. NP for refill per

## 2013-03-11 ENCOUNTER — Other Ambulatory Visit: Payer: Self-pay | Admitting: Pulmonary Disease

## 2013-03-11 MED ORDER — PINDOLOL 5 MG PO TABS: 5.0000 mg | ORAL_TABLET | Freq: Two times a day (BID) | ORAL | Status: DC

## 2013-03-11 NOTE — Assessment & Plan Note (Addendum)
Recent admission with recurrent PNA  Finished abx  CXR today shows cleared opacity  Cont on aspiration precautions  Cont w/ pallative care team

## 2013-03-11 NOTE — Assessment & Plan Note (Signed)
Chronic Hyponatremia  Check bmet today

## 2013-03-11 NOTE — Progress Notes (Signed)
Subjective:    Patient ID: Margaret [name], female    DOB: 04/18/1924, 79 y.o.   MRN: [id number]  HPI 79 y/o WF here for a follow up visit... She has Chr Sinusitis;  HBP;  ASPVD;  Chol;  GERD/ Constip w/ hx impaction;  UTIs;  DJD/ DDD;  Hx anxiety;  Hx anemia...   ~  September 05, 2011:  640mo ROV & post hosp> Adm 08/29/11 w/ adb pain, fecal impaction, worsening dysphagia, UTI, HBP & chronic hyponatremia==> improved after fluids, laxatives, antibiotics, etc; MBS showed dysphagia & she is on a liq diet==> she has f/u w/ DrMagod soon for EGD & dilatation...  See Prob List below>>  ~  October 09, 2011:  20mo ROV> she reports doing fine, having good BMs, & no new complaints or concerns today...   BP controlled on Cozaar & Visken, 142/64 today, denies CP/ palpit/ ch in SOB/ edema...  She had EGD w/ Savory dilatation from her gastroenterologist [facility] 09/10/11> essentially neg EGD (sm HH, few tiny gastric polyps) & dilated w/o difficulty; states swallowing is improved, OK now...  Denies constip now on good regimen w/ Miralax daily & Dulcolax tabs/ suppos as needed...  Other problems as noted>>  ~  December 10, 2011:  640mo ROV & f/u from ER 2/13> seen in ER w/ abd pain, loose stool & N/V;  Exam was neg w/o abd tenderness or distention but rectal showed large hard stool burden in rectum;  Labs showed Hg=10.3, Na=125, UA was +UTI;  XRay showed mod stool burden in colon;  She was given dose of Rocephin, suppository, asked to incr Miralax to Bid...  I note that for all her trouble w/ chronic constipation she just doesn't get it & won't take regular dosing of Miralax, Senakot-S, dulcolax etc; she could not afford & would not fill the Amitiza Rx or Linzess...  See prob list below>>    She reports "everything is OK now" & when pressed she admitted to not taking regular meds for her chr constipation> advised to restart MIRALAX Daily Qam & SENAKOT-S Qhs... She tells me she fell at church several weeks ago, no serious injury &  didn't go to the ER but this launched [place] into a discussion about her status>> Lives alone in her own home, does some housework, takes her own meds ?OK, daughter visits 2-3x per week, son lives in [place], son in law does her yard work, sister drives her to appts, she says she is safe & does ok at home, she has no intention of looking into AL etc...  ~  March 10, 2012:  17mo ROV & Adalae has a number of chronic complaints> feels weak, not able to walk far, food doesn't taste good, some minor bruising noted, etc; states "I'm doing pretty good on my own at home" & she refuses to make other plans for the future...     As noted she has had a signif prob w/ constipation & XRays have consistently shown large stool burden; she seems incapable of self monitoring BMs/ constip/ etc; she has only been taking 1 tablesp of Miralax daily & once again asked to take 1 capful of Miralax daily along w/ 1-2 Senakot-S at bedtime!!!    We reviewed prob list, meds, xrays and labs> see below for updates >>  ~  July 21, 2012:  7mo ROV & post hosp check> presents from [facility] w/o med list today for post [facility] check, states she is trying to get into AL  or independ living facility...    Hosp 9/4 - 04/28/12 by [facility] w/ "I was sick" per pt- chr constip, chr hyponatremia, HBP- she was c/o n/v & abd pain- assoc w/ recurrent constip, her gastroenterologist is DrMagod & he has her on Miralax alone for her slow transit (although we have had her on more meds & she still gets constipated); also had evid proctitis on CT Abd & treated w/ Cipro/ Flagyl in hosp; also had patulous esoph w/ fluid & debris present- DrMagod did EGD/ dilatation 10/13 showing smHH w/ short segm Barrett's, poor esoph motility, mult sm gastric polyps, s/p savory dilatation;  She also had prob asp pneumonia & saw DrHatcher for ID- they considered MAI but signs more c/w chr asp due to her esoph prob;  They also had the palliative care team involved- she is a DNR,  declines feeding tubes, they were to f/u as out pt...     As noted she remains in [facility] at present, attended by DrSouth> we have called for her current med list >> We reviewed the following medical problems during today's office visit >>     Chronic sinus problems> followed by DrShoemaker- hx nasal septal perf, on Saline, Mucinex, Fluids...    Chronic Lung Dis> Bronchiectasis, recurrent asp pneumonia, ?MAI?> prev Mucinex was stopped in the NH, no pos resp cultures in EPIC...    HBP> on Cozaar50, Pindolol5Bid; BP= 150/80 & she denies CP, palpit, ch in SOB, edema, etc...    Cerebrovasc Dis> prev on ASA81; she denies cerebral ischemic symptoms...    Ven Insuffic> she knows to avoid sodium, elev legs, wear support hose when needed...    CHOL> she declines any chol meds; last FLP 7/12 was ?on Cres20- TChol 163, TG 91, HDL 72, LDL 73    Hypothy> on Synth75, last TSH 9/13 = 0.51    GI- GERD, esoph dysmotility w/ chr asp, ?Barretts, severe chr constipation> Prevacid30/d, pills crushed in applesauce, Miralax 17gm/d, Senakot-S Bid; her GI issues are managed by DrMagod et al...    Hx recurrent UTIs> Urine cult at [facility] grew Citrobacter- sens to Cipro, treated...    DJD, DDD in Cspine> on VitD 50K weekly, Tylenol prn...    Anxiety> not currently on anxiolytic Rx...    Hx Urticaria> the etiology was never delineated, but hasn't been recurrent either...    Anemia, B12 Defic> on VitB121000 mcg/d; last B12 level was 1/13 = 635... We reviewed prob list, meds, xrays and labs> see below for updates >>   ~  September 03, 2012:  6wk ROV & post hospital check > [name] was again admitted 12/28 - 08/19/12 by [facility] w/ episode confusion, ER eval revealed hyponatremia (121), UTI (pan-sens Klebs), & stroke w/ an acute right occipital infarct on MRI (likely due to her small vessel dis); her sodium improved w/ IVF, her aspirin was increased to 325mg /d, & they convinced her to go back on Statin rx w/ Simva20; she had  in-patient therapies & was disch to skilled nursing but family declined & took her home w/ home health=> now in new apt at the [facility]...     Chronic sinus problems> followed by DrShoemaker- hx nasal septal perf, on Saline, Mucinex, Fluids...    Chronic Lung Dis> Bronchiectasis, recurrent asp pneumonia, ?MAI?> prev Mucinex was stopped in the NH, no pos resp cultures in EPIC...    HBP> on Lisinopril2.5, Pindolol5Bid; BP= 180/84 & she denies CP, palpit, ch in SOB, edema; we decided to switch the Lisin to  LOSARTAN 50mg /d...    Cerebrovasc Dis> on ASA325; she denies recurrent cerebral ischemic symptoms...    Ven Insuffic> she knows to avoid sodium, elev legs, wear support hose when needed...    CHOL> now on Simva20 since 1/14 hosp; last FLP12/13 in hosp showed TChol 200, TG 76, HDL 78, LDL 107=> Neuro started the Simva20.    Hypothy> on Synth75, last TSH 12/13 = 1.54    GI- GERD, esoph dysmotility w/ chr asp, ?Barretts, severe chr constipation> Prevacid30/d, pills crushed in applesauce, Miralax 17gm/d, Senakot-S Bid & Mineral Oil prn; her GI issues are managed by DrMagod et al...    Hx recurrent UTIs> Urine cult at [facility] grew Citrobacter- sens to Cipro; UTI on adm 12/13=> pan-sens Klebs...    DJD, DDD in Cspine> on VitD 50K weekly, Tylenol prn...    Anxiety> on Xanax 0.25mg  prn since 1/14 hosp...    Hx Urticaria> the etiology was never delineated, but hasn't been recurrent either...    Anemia, B12 Defic> on VitB121000 mcg/d; last B12 level was 1/13 = 635... We reviewed prob list, meds, xrays and labs> see below for updates >>   ~  November 19, 2012:  2-53mo ROV & yet another post hosp check> she was hosp 3/18 - 11/04/12 by [facility] after n/v and prob aspiration- incr SOB & right mid/ lower zone opac on CXR; cults were neg, she had speech path eval & felt to be high risk for asp w/ severe cerv esoph phase dysphagia etc & she declined PEG tube- prognosis was felt to be poor- they were encouraged to consider  palliative care consult=> seen by DrMTaylor & her very helpful note is reviewed (they have still not made any changes, still at the [facility] in her apt)... She has [place] home health- PT OT Speech path, despite this help daugh notes that she is weaker, esp late in the day; they are not yet ready to force the issue of AL placement... We reviewed the following medical problems/ meds during today's office visit >>      Chronic sinus problems> followed by DrShoemaker- hx nasal septal perf, on Saline, Mucinex, Fluids...    Chronic Lung Dis> Bronchiectasis, recurrent asp pneumonia, ?MAI?> prev Mucinex was stopped in the NH, no pos resp cultures in EPIC; hosp 3/14 w/ asp pneumonia & responded to Rx...    HBP> on Pindolol5Bid, Losartan50; BP= 122/68 & she denies CP, palpit, ch in SOB, edema; but she is weak & chronically debilitated...    Cerebrovasc Dis> on ASA325; she denies recurrent cerebral ischemic symptoms...    CHOL> now off Simva20 again; last FLP12/13 in hosp showed TChol 200, TG 76, HDL 78, LDL 107=> Neuro started the Simva20 but she won't stick w/ it...    Hypothy> on Synth75, last TSH 12/13 = 1.54    GI- GERD, esoph dysmotility w/ chr asp, ?Barretts, severe chr constipation> Prevacid30/d, pills crushed in applesauce, Miralax 17gm/d, Senakot-S Bid & Mineral Oil prn; her GI issues are managed by DrMagod et al...    Hx recurrent UTIs> Urine cult at [facility] grew Citrobacter- sens to Cipro; UTI on adm 12/13=> pan-sens Klebs; UTI on adm 3/14=> sens Staph, Rx'd...    DJD, DDD in Cspine> off VitD 50K, using Tylenol prn...    Anxiety> on Xanax 0.25mg  prn...    Anemia, B12 Defic> on VitB121000 mcg/d; last B12 level was 1/13 = 635...  We reviewed prob list, meds, xrays and labs> see below for updates >>  CXR 4/14 showed improvement w/ resolution  of the right base pneumonia, better aeration, etc; neck hardware, calcif Ao, etc w/o change... LABS 4/14:  Chems- Na=124, renal wnl, LFTs wnl, Alb=4.3;  CBC-  anemia w/ Hg= 9.8 (sl improved from hosp)... rec to restrict free water.   03/06/13 Post Hospital follow up and labs  Returns for 2 weeks post hospital follow up  Admitted 7/7-7/11 for acute mental status changes. She has a hx of chronic hyponatremia . Na was 112 on admission. She had a recent UTI. , CXR with new R lung air space disease. Tx with abx. Na at discharge was 127.  Since discharge she is feeling some better but still weak and tired. No chest pain, orthopnea, n/v/d.         Problem List:      SINUSITIS, CHRONIC (ICD-473.9) - Long Hx chronic sinus problems - Eval & Rx by DrShoemaker w/ hx epistaxis, nasal septal erosion w/ prev "button";  and nasal polyps... she uses Saline, Mucinex, etc... she notes mild chr cough from the drainage- we reviewed chronic nasal regimen & rec MUCINEX DM 2Bid...  HYPERTENSION (ICD-401.9) >> currently controlled on LOSARTAN 50mg /d & PINDOLOL 5mg Bid... ~  Baseline EKG w/ NSR, WNL... ~  2D ECHO 2001 was normal... ~  CXR 11/07 w/ Biapical pl thickening, NAD... CXR 9/09 showed COPD w/ incr markings, NAD... f/u CXR 5/11 showed COPD, NAD...  ~  11/10: c/o weak in AM- decision to decr Amlodipine to 5mg /d & BP has been stable on this. ~  8/11: Hosp by [facility] w/ "presyncope" & meds were adjusted but she restarted prev Rx on discharge> we decided to stop Diovan/Hct in favor of LOSARTAN 50mg /d (?if she ever even filled it- reminded to bring all med bottles to every visit). ~  7/12: BP today= 120/70, no postural changes, ?on Amlodip5 & Losartan50... she denies HA, visual symptoms, CP, palpit, change in SOB, edema, etc... ~  10/12: Near syncope at [facility]> saw DrRoss w/ attempted meds changes (asked to stop Amlodip/Losartan & start Pindolol5Bid) but she decided to take all 3... ~  11/12: BP today= 132/70 w/ transient postural drop; asked to stop Amlodip, continue LOSARTAN50, take VISKEN5Bid... ~  1/13: BP= 118/72 on same meds & feeling better since disch... ~  2/13: BP= 142/64  on Losar50 & Pindolol5Bid, continue same; 1 view CXR 2/13 showed COPD, biapical scarring, NAD... ~  4/13: BP= 128/72 w/o postural changes; she denies CP, palpit, ch in DOE, edema... ~  7/13:  BP= 118/72 & she remains asymptomatic... ~  12/13:  CXR showed norm heart size, clear lungs, NAD;  EKG 12/13 showed NSR, rate87, 1st degree AVB, NAD... ~  12/13:  2DEcho showed norm LVF w/ EF=65-70%, Gr1DD, mildly calcif MV leaflets w/o stenosis or regurg... ~  1/14: on Lisinopril2.5, Pindolol5Bid; BP= 180/84 & she denies CP, palpit, ch in SOB, edema; we decided to switch the Lisin to LOSARTAN 50mg /d.. ~  4/14:   CEREBROVASCULAR DISEASE (ICD-437.9) - on ASA 325mg /d... Prev eval for syncope (related to postural BP changes) w/ MRI showing small vessel dz;  she has bilat CBruits w/ Carotid Dopplers in 2001 showing mild plaque, no signif ICA stenoses... ~  she denies cerebral ischemic symptoms on the ASA 81mg /d...  ~  Adm 12/13-1/14 & MRI brain showed sm acute infarct in right occipital lobe, advanced microvasc ischemia; seen by Neuro & they incr her ASA to 325mg /d. ~  12/13: CDopplers showed mild to mod heterogeneous plaque on right w/ 0-39% ICA stenosis; only mild plaque  on left & no stenosis, vertebrals were antegrade... ~  1/14: on ASA325; she denies recurrent cerebral ischemic symptoms...  VENOUS INSUFFICIENCY (ICD-459.81) - on low sodium diet + support hose as needed.  HYPERCHOLESTEROLEMIA (ICD-272.0) - prev on Simva40 but she thinks this made her weak & she stopped it;  Tried Crestor 20mg /d but ?if filling this med regularly or not, asked to bring all bottles to each visit... ~  FLP 12/08 showed TChol 204, TG 93, HDL 67, LDL 106... On Simva40/d. ~  FLP 9/09 showed TChol 183, TG 53, HDL 68, LDL 105... Contin same + diet. ~  FLP 8/10 showed TChol 224, TG 78, HDL 67, LDL 137... rec> take med regularly & work on diet. ~  FLP 5/11 showed TChol 187, TG 80, HDL 67, LDL 104 ~  8/11: she thinks the Simva40 makes  her weak & she wants to put it on HOLD. ~  FLP off meds 3/12 showed TChol 293, TG 90, HDL 81, LDL 191... rec to restart Cres20. ~  FLP 7/12 ?on Cres20 showed TChol 163, TG 91, HDL 72, LDL 73 ~  She has since stopped the Crestor on her own (?making her weak). ~  1/13 & 4/13: Pt on diet alone & doesn't want Chol meds; not fasting today for FLP... ~  FLP 12/13 in hosp showed TChol 200, TG 76, HDL 78, LDL 107=> Neuro started the Simva20=> she stopped on her own.  UNSPECIFIED DISORDER OF THYROID/ HYPOTHYROIDISM - she has a sl elevated TSH which we were following & finally started SYNTHROID 20mcg/d 3/12... ~  labs 9/09 showed TSH= 7.31 ~  labs 1/10 showed TSH= 5.40 ~  labs 8/10 showed TSH= 5.08... she does c/o being "cold" ~  labs 5/11 showed TSH= 6.11... she notes that her energy is "pretty fair" ~  labs 8/11 showed TSH= 0.73... wt= 116# & stable over the last 60mo. ~  Labs 3/12 showed TSH= 9.85... Rec> start SYNTHROID 35mcg/d. ~  Labs 7/12 on Levo75 showed TSH= 0.27... Keep same for now. ~  Labs 1/13 on Levo75 showed TSH= 4.68 ~  Labs 12/13 on Levo75 showed TSH= 1.54  GERD (ICD-530.81) - PPI changed to generic PREVACID 30mg /d... Known HH & GERD w/ EGD from [facility] 8/01 & 2/06 showing HH, GERD, erosions, & gastric polyp...  ~  EGD 12/08 w/ savory dilatation... she is encouraged to f/u w/ DrMagod if symptoms occur... ~  f/u EGD w/ dilatation 11/11 by [facility] for dysphagia symptoms showed tort esoph, sm HH, tiny gastric polyps, Savory dil done. ~  Yet another EGD & dilatation 1/13 by DrMagod; smHH, few tiny gastric polyps, Savory dilatation done... ~  Adm 3/14 w/ epis of asp pneumonia; seen by speech path w/ severe dysphagia- she refuses PEG etc...  CONSTIPATION (ICD-564.00) - Hx severe constipation w/ fecal impaction req hosp 2/03 & intermittently over the yrs... ~  colonoscopy 2/06 DrMagod w/ hemorrhoids & sm polyps... Encouraged to use MIRALAX daily... ~  [facility] 10/11 w/ constip & fecal impaction>  disimpacted & started back on MIRALAX & SENAKOT-S... ~  CT Abd 10/11 showed norm GB, sm bilat renal cysts, prev hyst, rectal wall thickening c/w proctitis. ~  [facility] 1/13 w/ similar problem & we reviewed the importance of taking the Miralax & Senakot every day & monitoring her output w/ contingency meds (Mag citrate & Dulcolax) if needed... ~  2/13 OV: she reports improved w/ regular Miralax & prn Dulcolax; asked to restart the SENAKOT-S Qhs as well.Marland KitchenMarland Kitchen  UTI (ICD-599.0) - Hx chronic UTI's Rx by Urology Joneen Boers w/ Macrodantin in the past... ~  8/11: Hosp by [facility] w/ UTI- sens EColi treated w/ Rocephin/ then Septra. ~  2/13: UTI w/ final C&S growing Staph & Enterococcus both sens to Levaquin & Tcn> Rx Levaquin 500mg /d + Align. ~  She has had numerous UTIs in the interval- Hosp 1/14 w/ pan-sens Klebs & [facility] 3/14 w/ sens Staph...  DEGENERATIVE JOINT DISEASE (ICD-715.90) - she uses Tylenol/ Advil as needed... ~  5/09:  she fell and broke her right wrist- s/p ORIF 6/09 by DrWeingold  DEGENERATIVE DISC DISEASE, CERVICAL SPINE (ICD-722.4) - CSpine surg by DrJenkins 1998... ~  12/13:  She is back on Vit D 50,000 u weekly...  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 ~  1/14: she was disch by [facility] on Lisinopril 2.5mg  & we will watch her closely for allergic reaction...  ANEMIA (ICD-285.9) >> she takes one IRON tab daily & B12 1086mcg/d... VITAMIN B12 DEFICIENCY (ICD-266.2) ~  labs 6/08 showed Hg= 10.5 ~  labs 12/08 showed Hg= 12.3, Fe= 92 ~  labs 9/09 showed Hg= 11.8, MCV=89 ~  labs 1/10 showed Hg= 12.1, MCV= 89, Fe= 83, B12= 186... started on B12 shots monthly. ~  labs 8/10 showed Hg= 12.2, MCV= 92, Fe= 87, B12= 470... continue Rx. ~  labs 5/11 showed Hg= 11.2, MCV= 88 ~  labs 8/11 in hosp showed Hg= 10-11 range; ?switched to Oral B12- 1048mcg/d... ~  labs 10/11 in hosp showed Hg= 10-11 range... asked to start  Femiron one tab daily. ~  Labs 3/12 showed Hg= 12.0, MCV= 90 ~  Labs 7/12 showed Hg= 11.1, MCV= 89, Fe= 87 (22%sat) ~  Labs 1/13 showed Hg=10.3, MCV=90, Fe=46 (13%sat), B12=635, SPE=neg (no monoclonal spike). ~  Labs 1/14 showed Hg= 10-11 range ~  Labs 3/14 Hosp showed Hg~8-9 range... ~  Labs 4/14 showed Hg= 9.8  Hx SHINGLES - right T9-10 distrib 6/10 & treated w/ Famvir, Medrol, DCN100...   Past Surgical History  Procedure Laterality Date  . Tonsillectomy    . Total abdominal hysterectomy w/ bilateral salpingoophorectomy    . Cervical fusion    . Cataract extraction    . Nasal sinus surgery    . Esophagogastroduodenoscopy  09/10/2011    Procedure: ESOPHAGOGASTRODUODENOSCOPY (EGD);  Surgeon: Petra Kuba, MD;  Location: [facility] ENDOSCOPY;  Service: Endoscopy;  Laterality: N/A;  c-arm needed  . Savory dilation  09/10/2011    Procedure: SAVORY DILATION;  Surgeon: Petra Kuba, MD;  Location: [facility] ENDOSCOPY;  Service: Endoscopy;  Laterality: N/A;  . Esophagogastroduodenoscopy  05/13/2012    Procedure: ESOPHAGOGASTRODUODENOSCOPY (EGD);  Surgeon: Petra Kuba, MD;  Location: [facility] ENDOSCOPY;  Service: Endoscopy;  Laterality: N/A;  with carm  . Savory dilation  05/13/2012    Procedure: SAVORY DILATION;  Surgeon: Petra Kuba, MD;  Location: [facility] ENDOSCOPY;  Service: Endoscopy;  Laterality: N/A;    Outpatient Encounter Prescriptions as of 03/06/2013  Medication Sig Dispense Refill  . aspirin EC 325 MG tablet Take 1 tablet (325 mg total) by mouth daily.  30 tablet  0  . cyanocobalamin 1000 MCG tablet Take 1,000 mcg by mouth every morning.       . feeding supplement (ENSURE COMPLETE) LIQD Take 237 mLs by mouth daily.      . lansoprazole (PREVACID) 30 MG capsule Take 30 mg by mouth daily.       Marland Kitchen  levothyroxine (SYNTHROID, LEVOTHROID) 75 MCG tablet Take 75 mcg by mouth every morning.       . methylcellulose (ARTIFICIAL TEARS) 1 % ophthalmic solution Place 1 drop into both eyes as needed.      .  senna-docusate (SENOKOT-S) 8.6-50 MG per tablet Take 1-2 tablet by mouth at bedtime as needed      . pindolol (VISKEN) 5 MG tablet Take 5 mg by mouth 2 (two) times daily.      . [DISCONTINUED] azithromycin (ZITHROMAX) 500 MG tablet Take 1 tablet (500 mg total) by mouth daily at 6 PM.  2 tablet  0  . [DISCONTINUED] losartan (COZAAR) 50 MG tablet Take 1 tablet (50 mg total) by mouth daily.  30 tablet  11   No facility-administered encounter medications on file as of 03/06/2013.    Allergies  Allergen Reactions  . Aspirin Hives    High Doses    Current Medications, Allergies, Past Medical History, Past Surgical History, Family History, and Social History were reviewed in [employer] record.    Review of Systems        See HPI - all other systems neg except as noted...  The patient complains of weight loss, decreased hearing, dyspnea on exertion, headaches, severe indigestion/heartburn, muscle weakness, and difficulty walking.  The patient denies anorexia, fever, weight gain, vision loss, hoarseness, chest pain, syncope, peripheral edema, prolonged cough, hemoptysis, abdominal pain, melena, hematochezia, hematuria, incontinence, suspicious skin lesions, transient blindness, depression, unusual weight change, abnormal bleeding, enlarged lymph nodes, and angioedema.     Objective:   Physical Exam     WD, WN, 79 y/o WF in NAD... she is chr ill appearing... wt 112# GENERAL:  Alert & oriented; pleasant & cooperative... HEENT:  [place]/AT, EOM-wnl, PERRLA, EACs- sl wax, TMs-wnl, NOSE- nasal septal perf, THROAT-clear  NECK:  Supple w/ decrROM; no JVD; normal carotid impulses w/ 1+bruits; no thyromegaly or nodules palpated; no lymphadenopathy. CHEST:  Clear to P & A; without wheezes or rales, but scat rhonchi heard... HEART:  Regular Rhythm;  gr1/6 SEM without rubs or gallops detected... ABDOMEN:  Soft & nontender; normal bowel sounds; no organomegaly or masses palpated... EXT:  without deformities, mild arthritic changes; no varicose veins/ +venous insuffic/ no edema. NEURO:  CN's intact;  no focal neuro deficits, she is weak diffusely... DERM: scarring in the right T9-10 distrib of her prev shingles eruption... . CXR 03/06/13 >Lungs now clear. No new opacity.      Assessment & Plan:

## 2013-03-13 NOTE — Progress Notes (Signed)
Quick Note:  ATC pt x1. Line rang >10x with no answer and no option to LM. WCB. ______

## 2013-06-08 ENCOUNTER — Telehealth: Payer: Self-pay | Admitting: Pulmonary Disease

## 2013-06-08 NOTE — Telephone Encounter (Signed)
I spoke with daughter and appt scheduled. Nothing further needed

## 2013-06-09 ENCOUNTER — Ambulatory Visit: Payer: Medicare Other | Admitting: Pulmonary Disease

## 2013-06-17 ENCOUNTER — Emergency Department (HOSPITAL_COMMUNITY): Payer: Medicare Other

## 2013-06-17 ENCOUNTER — Emergency Department (HOSPITAL_COMMUNITY)
Admission: EM | Admit: 2013-06-17 | Discharge: 2013-06-17 | Disposition: A | Discharge status: Home / Self Care | Payer: Medicare Other | Attending: Emergency Medicine | Admitting: Emergency Medicine

## 2013-06-17 ENCOUNTER — Encounter (HOSPITAL_COMMUNITY): Payer: Self-pay | Admitting: Emergency Medicine

## 2013-06-17 DIAGNOSIS — F0150 Vascular dementia without behavioral disturbance: Secondary | ICD-10-CM | POA: Insufficient documentation

## 2013-06-17 DIAGNOSIS — I679 Cerebrovascular disease, unspecified: Secondary | ICD-10-CM | POA: Insufficient documentation

## 2013-06-17 DIAGNOSIS — I872 Venous insufficiency (chronic) (peripheral): Secondary | ICD-10-CM | POA: Insufficient documentation

## 2013-06-17 DIAGNOSIS — E039 Hypothyroidism, unspecified: Secondary | ICD-10-CM | POA: Insufficient documentation

## 2013-06-17 DIAGNOSIS — Y9389 Activity, other specified: Secondary | ICD-10-CM | POA: Insufficient documentation

## 2013-06-17 DIAGNOSIS — Z79899 Other long term (current) drug therapy: Secondary | ICD-10-CM | POA: Insufficient documentation

## 2013-06-17 DIAGNOSIS — I10 Essential (primary) hypertension: Secondary | ICD-10-CM | POA: Insufficient documentation

## 2013-06-17 DIAGNOSIS — K219 Gastro-esophageal reflux disease without esophagitis: Secondary | ICD-10-CM | POA: Insufficient documentation

## 2013-06-17 DIAGNOSIS — R296 Repeated falls: Secondary | ICD-10-CM | POA: Insufficient documentation

## 2013-06-17 DIAGNOSIS — K589 Irritable bowel syndrome without diarrhea: Secondary | ICD-10-CM | POA: Insufficient documentation

## 2013-06-17 DIAGNOSIS — W19XXXA Unspecified fall, initial encounter: Secondary | ICD-10-CM

## 2013-06-17 DIAGNOSIS — F411 Generalized anxiety disorder: Secondary | ICD-10-CM | POA: Insufficient documentation

## 2013-06-17 DIAGNOSIS — S0003XA Contusion of scalp, initial encounter: Secondary | ICD-10-CM | POA: Insufficient documentation

## 2013-06-17 DIAGNOSIS — Y929 Unspecified place or not applicable: Secondary | ICD-10-CM | POA: Insufficient documentation

## 2013-06-17 DIAGNOSIS — Z7982 Long term (current) use of aspirin: Secondary | ICD-10-CM | POA: Insufficient documentation

## 2013-06-17 DIAGNOSIS — J329 Chronic sinusitis, unspecified: Secondary | ICD-10-CM | POA: Insufficient documentation

## 2013-06-17 DIAGNOSIS — E7800 Pure hypercholesterolemia, unspecified: Secondary | ICD-10-CM | POA: Insufficient documentation

## 2013-06-17 DIAGNOSIS — M5030 Other cervical disc degeneration, unspecified cervical region: Secondary | ICD-10-CM | POA: Insufficient documentation

## 2013-06-17 DIAGNOSIS — M1990 Unspecified osteoarthritis, unspecified site: Secondary | ICD-10-CM | POA: Insufficient documentation

## 2013-06-17 LAB — URINALYSIS, ROUTINE W REFLEX MICROSCOPIC
Bilirubin Urine: NEGATIVE
Glucose, UA: NEGATIVE mg/dL
Ketones, ur: NEGATIVE mg/dL
Specific Gravity, Urine: 1.015 (ref 1.005–1.030)
pH: 7.5 (ref 5.0–8.0)

## 2013-06-17 LAB — URINE MICROSCOPIC-ADD ON

## 2013-06-17 MED ORDER — ACETAMINOPHEN 325 MG PO TABS: 650.0000 mg | ORAL_TABLET | Freq: Four times a day (QID) | ORAL | Status: DC | PRN

## 2013-06-17 MED ORDER — ACETAMINOPHEN 325 MG PO TABS
650.0000 mg | ORAL_TABLET | Freq: Once | ORAL | Status: AC
  Administered 2013-06-17: 650 mg via ORAL
  Filled 2013-06-17: qty 2

## 2013-06-17 NOTE — ED Provider Notes (Signed)
CSN: [id number]     Arrival date & time 06/17/13  1141 History   First MD Initiated Contact with Patient 06/17/13 1301     Chief Complaint  Patient presents with  . Fall  . Head Injury   (Consider location/radiation/quality/duration/timing/severity/associated sxs/prior Treatment) Patient is a 79 y.o. female presenting with fall and head injury.  Fall This is a new problem. The current episode started less than 1 hour ago. Episode frequency: once. The problem has been resolved. Associated symptoms include headaches. Pertinent negatives include no chest pain, no abdominal pain and no shortness of breath. Nothing aggravates the symptoms. Nothing relieves the symptoms.  Head Injury Location:  Occipital Mechanism of injury: fall   Pain details:    Quality:  Aching   Severity:  Moderate   Timing:  Constant   Progression:  Unchanged Chronicity:  New Relieved by:  Nothing Worsened by:  Nothing tried Associated symptoms: headache   Associated symptoms: no loss of consciousness, no nausea, no neck pain, no numbness and no vomiting     Past Medical History  Diagnosis Date  . Unspecified sinusitis (chronic)   . Unspecified essential hypertension   . Cerebrovascular disease, unspecified   . Unspecified venous (peripheral) insufficiency   . Pure hypercholesterolemia   . Unspecified disorder of thyroid   . Esophageal reflux   . Unspecified constipation   . Urinary tract infection, site not specified   . Osteoarthrosis, unspecified whether generalized or localized, unspecified site   . Degeneration of cervical intervertebral disc   . Closed fracture of other bone of wrist   . Anxiety state, unspecified   . Idiopathic urticaria   . Anemia, unspecified   . Other B-complex deficiencies   . Herpes zoster without mention of complication   . Stroke    Past Surgical History  Procedure Laterality Date  . Tonsillectomy    . Total abdominal hysterectomy w/ bilateral salpingoophorectomy    .  Cervical fusion    . Cataract extraction    . Nasal sinus surgery    . Esophagogastroduodenoscopy  09/10/2011    Procedure: ESOPHAGOGASTRODUODENOSCOPY (EGD);  Surgeon: Petra Kuba, MD;  Location: [facility] ENDOSCOPY;  Service: Endoscopy;  Laterality: N/A;  c-arm needed  . Savory dilation  09/10/2011    Procedure: SAVORY DILATION;  Surgeon: Petra Kuba, MD;  Location: [facility] ENDOSCOPY;  Service: Endoscopy;  Laterality: N/A;  . Esophagogastroduodenoscopy  05/13/2012    Procedure: ESOPHAGOGASTRODUODENOSCOPY (EGD);  Surgeon: Petra Kuba, MD;  Location: [facility] ENDOSCOPY;  Service: Endoscopy;  Laterality: N/A;  with carm  . Savory dilation  05/13/2012    Procedure: SAVORY DILATION;  Surgeon: Petra Kuba, MD;  Location: [facility] ENDOSCOPY;  Service: Endoscopy;  Laterality: N/A;   Family History  Problem Relation Age of Onset  . Diabetes Father   . Heart failure Mother   . Diabetes Sister    History  Substance Use Topics  . Smoking status: Never Smoker   . Smokeless tobacco: Never Used  . Alcohol Use: No   OB History   Grav Para Term Preterm Abortions TAB SAB Ect Mult Living                 Review of Systems  Constitutional: Negative for fever.  HENT: Negative for congestion.   Respiratory: Negative for cough and shortness of breath.   Cardiovascular: Negative for chest pain.  Gastrointestinal: Negative for nausea, vomiting, abdominal pain and diarrhea.  Musculoskeletal: Negative for neck pain.  Neurological: Positive for  headaches. Negative for loss of consciousness and numbness.  All other systems reviewed and are negative.    Allergies  Aspirin  Home Medications   Current Outpatient Rx  Name  Route  Sig  Dispense  Refill  . aspirin EC 325 MG tablet   Oral   Take 1 tablet (325 mg total) by mouth daily.   30 tablet   0   . cyanocobalamin 1000 MCG tablet   Oral   Take 1,000 mcg by mouth every morning.          . feeding supplement (ENSURE COMPLETE) LIQD   Oral   Take 237 mLs by mouth  daily.         . lansoprazole (PREVACID) 30 MG capsule   Oral   Take 30 mg by mouth daily.          Marland Kitchen levothyroxine (SYNTHROID, LEVOTHROID) 75 MCG tablet   Oral   Take 75 mcg by mouth every morning.          . methylcellulose (ARTIFICIAL TEARS) 1 % ophthalmic solution   Both Eyes   Place 1 drop into both eyes as needed.         . pindolol (VISKEN) 5 MG tablet   Oral   Take 1 tablet (5 mg total) by mouth 2 (two) times daily.   60 tablet   5   . senna-docusate (SENOKOT-S) 8.6-50 MG per tablet   Oral   Take 1 tablet by mouth at bedtime as needed for mild constipation. Take 1-2 tablet by mouth at bedtime as needed          BP 172/76  Pulse 76  Temp(Src) 98.4 F (36.9 C) (Oral)  Resp 18  SpO2 96% Physical Exam  Nursing note and vitals reviewed. Constitutional: She is oriented to person, place, and time. She appears well-developed and well-nourished. No distress.  HENT:  Head: Normocephalic and atraumatic. Head is without raccoon's eyes and without Battle's sign.    Nose: Nose normal.  Mouth/Throat: Oropharynx is clear and moist.  Eyes: Conjunctivae and EOM are normal. Pupils are equal, round, and reactive to light. No scleral icterus.  Neck: Normal range of motion. Neck supple. No spinous process tenderness and no muscular tenderness present.  Cardiovascular: Normal rate, regular rhythm, normal heart sounds and intact distal pulses.   No murmur heard. Pulmonary/Chest: Effort normal and breath sounds normal. No stridor. No respiratory distress. She has no rales. She exhibits no tenderness.  Abdominal: Soft. Bowel sounds are normal. She exhibits no distension. There is no tenderness. There is no rebound and no guarding.  Musculoskeletal: Normal range of motion. She exhibits no edema and no tenderness.       Thoracic back: She exhibits no tenderness and no bony tenderness.       Lumbar back: She exhibits no tenderness and no bony tenderness.  No evidence of trauma  to extremities, except as noted.  2+ distal pulses.    Neurological: She is alert and oriented to person, place, and time.  Skin: Skin is warm and dry. No rash noted.  Psychiatric: She has a normal mood and affect. Her behavior is normal.    ED Course  Procedures (including critical care time) Labs Review Labs Reviewed  URINALYSIS, ROUTINE W REFLEX MICROSCOPIC - Abnormal; Notable for the following:    APPearance CLOUDY (*)    Hgb urine dipstick TRACE (*)    Leukocytes, UA MODERATE (*)    All other components within normal limits  URINE MICROSCOPIC-ADD ON - Abnormal; Notable for the following:    Squamous Epithelial / LPF FEW (*)    All other components within normal limits   Imaging Review Ct Head Wo Contrast  06/17/2013   CLINICAL DATA:  Larey Seat backwards and hit right occipital area today with generalized weakness  EXAM: CT HEAD WITHOUT CONTRAST  CT CERVICAL SPINE WITHOUT CONTRAST  TECHNIQUE: Multidetector CT imaging of the head and cervical spine was performed following the standard protocol without intravenous contrast. Multiplanar CT image reconstructions of the cervical spine were also generated.  COMPARISON:  08/15/2012  FINDINGS: CT HEAD FINDINGS  Right posterior scalp hematoma at the vertex. No intracranial hemorrhage or extra-axial fluid. Diffuse atrophy and involutional white matter change. No evidence of mass or vascular territory infarct. There is a inflammatory change in the paranasal sinuses. There is no skull fracture.  CT CERVICAL SPINE FINDINGS  No prevertebral soft tissue swelling. Extensive bilateral carotid arterial calcification. C6 and C7 are fused surgically. Minimal retrolisthesis of C5 on C6 with significant C5-6 degenerative disc disease. No fractures.  IMPRESSION: Scalp hematoma with no acute intracranial abnormalities. No evidence of acute abnormality involving the cervical spine.   Electronically Signed   By: Esperanza Heir M.D.   On: 06/17/2013 14:12   Ct Cervical  Spine Wo Contrast  06/17/2013   CLINICAL DATA:  Larey Seat backwards and hit right occipital area today with generalized weakness  EXAM: CT HEAD WITHOUT CONTRAST  CT CERVICAL SPINE WITHOUT CONTRAST  TECHNIQUE: Multidetector CT imaging of the head and cervical spine was performed following the standard protocol without intravenous contrast. Multiplanar CT image reconstructions of the cervical spine were also generated.  COMPARISON:  08/15/2012  FINDINGS: CT HEAD FINDINGS  Right posterior scalp hematoma at the vertex. No intracranial hemorrhage or extra-axial fluid. Diffuse atrophy and involutional white matter change. No evidence of mass or vascular territory infarct. There is a inflammatory change in the paranasal sinuses. There is no skull fracture.  CT CERVICAL SPINE FINDINGS  No prevertebral soft tissue swelling. Extensive bilateral carotid arterial calcification. C6 and C7 are fused surgically. Minimal retrolisthesis of C5 on C6 with significant C5-6 degenerative disc disease. No fractures.  IMPRESSION: Scalp hematoma with no acute intracranial abnormalities. No evidence of acute abnormality involving the cervical spine.   Electronically Signed   By: Esperanza Heir M.D.   On: 06/17/2013 14:12  All radiology studies independently viewed by me.     EKG Interpretation   None       MDM   1. Fall, initial encounter   2. Scalp hematoma, initial encounter    79 yo female with mechanical fall while using her walker.  Fell back and hit her head on the concrete.  Only complaint is head pain . No other injuries by history or exam.  CT negative.  No laceration.  She ambulated at her baseline.   DC'd home.  Family will check on her tonight.  Well appearing at discharge.      Candyce Churn, MD 06/17/13 [phone]

## 2013-06-17 NOTE — ED Notes (Signed)
Per EMS pt lost her balance and fell backwards hit her head. No loc, hematoma to back of head, no bleeding noted.

## 2013-06-18 ENCOUNTER — Other Ambulatory Visit: Payer: Self-pay

## 2013-06-21 ENCOUNTER — Emergency Department (HOSPITAL_COMMUNITY): Payer: Medicare Other

## 2013-06-21 ENCOUNTER — Inpatient Hospital Stay (HOSPITAL_COMMUNITY)
Admission: EM | Admit: 2013-06-21 | Discharge: 2013-06-23 | DRG: 177 | Disposition: A | Discharge status: Hospice | Payer: Medicare Other | Attending: Internal Medicine | Admitting: Internal Medicine

## 2013-06-21 ENCOUNTER — Encounter (HOSPITAL_COMMUNITY): Payer: Self-pay | Admitting: Emergency Medicine

## 2013-06-21 DIAGNOSIS — L501 Idiopathic urticaria: Secondary | ICD-10-CM

## 2013-06-21 DIAGNOSIS — S0003XA Contusion of scalp, initial encounter: Secondary | ICD-10-CM | POA: Diagnosis present

## 2013-06-21 DIAGNOSIS — I509 Heart failure, unspecified: Secondary | ICD-10-CM

## 2013-06-21 DIAGNOSIS — E079 Disorder of thyroid, unspecified: Secondary | ICD-10-CM

## 2013-06-21 DIAGNOSIS — K219 Gastro-esophageal reflux disease without esophagitis: Secondary | ICD-10-CM

## 2013-06-21 DIAGNOSIS — J329 Chronic sinusitis, unspecified: Secondary | ICD-10-CM

## 2013-06-21 DIAGNOSIS — S62109A Fracture of unspecified carpal bone, unspecified wrist, initial encounter for closed fracture: Secondary | ICD-10-CM

## 2013-06-21 DIAGNOSIS — N179 Acute kidney failure, unspecified: Secondary | ICD-10-CM

## 2013-06-21 DIAGNOSIS — Z79899 Other long term (current) drug therapy: Secondary | ICD-10-CM

## 2013-06-21 DIAGNOSIS — I872 Venous insufficiency (chronic) (peripheral): Secondary | ICD-10-CM

## 2013-06-21 DIAGNOSIS — R1310 Dysphagia, unspecified: Secondary | ICD-10-CM

## 2013-06-21 DIAGNOSIS — I672 Cerebral atherosclerosis: Secondary | ICD-10-CM | POA: Diagnosis present

## 2013-06-21 DIAGNOSIS — R55 Syncope and collapse: Secondary | ICD-10-CM

## 2013-06-21 DIAGNOSIS — J4489 Other specified chronic obstructive pulmonary disease: Secondary | ICD-10-CM | POA: Diagnosis present

## 2013-06-21 DIAGNOSIS — K5900 Constipation, unspecified: Secondary | ICD-10-CM | POA: Diagnosis present

## 2013-06-21 DIAGNOSIS — R636 Underweight: Secondary | ICD-10-CM

## 2013-06-21 DIAGNOSIS — J690 Pneumonitis due to inhalation of food and vomit: Principal | ICD-10-CM | POA: Diagnosis present

## 2013-06-21 DIAGNOSIS — F0150 Vascular dementia without behavioral disturbance: Secondary | ICD-10-CM | POA: Diagnosis present

## 2013-06-21 DIAGNOSIS — E538 Deficiency of other specified B group vitamins: Secondary | ICD-10-CM | POA: Diagnosis present

## 2013-06-21 DIAGNOSIS — F411 Generalized anxiety disorder: Secondary | ICD-10-CM | POA: Diagnosis present

## 2013-06-21 DIAGNOSIS — Z8249 Family history of ischemic heart disease and other diseases of the circulatory system: Secondary | ICD-10-CM

## 2013-06-21 DIAGNOSIS — W19XXXA Unspecified fall, initial encounter: Secondary | ICD-10-CM | POA: Diagnosis present

## 2013-06-21 DIAGNOSIS — I10 Essential (primary) hypertension: Secondary | ICD-10-CM | POA: Diagnosis present

## 2013-06-21 DIAGNOSIS — I739 Peripheral vascular disease, unspecified: Secondary | ICD-10-CM | POA: Diagnosis present

## 2013-06-21 DIAGNOSIS — R531 Weakness: Secondary | ICD-10-CM

## 2013-06-21 DIAGNOSIS — I679 Cerebrovascular disease, unspecified: Secondary | ICD-10-CM

## 2013-06-21 DIAGNOSIS — Z66 Do not resuscitate: Secondary | ICD-10-CM | POA: Diagnosis present

## 2013-06-21 DIAGNOSIS — K2289 Other specified disease of esophagus: Secondary | ICD-10-CM | POA: Diagnosis present

## 2013-06-21 DIAGNOSIS — R296 Repeated falls: Secondary | ICD-10-CM

## 2013-06-21 DIAGNOSIS — E785 Hyperlipidemia, unspecified: Secondary | ICD-10-CM | POA: Diagnosis present

## 2013-06-21 DIAGNOSIS — J449 Chronic obstructive pulmonary disease, unspecified: Secondary | ICD-10-CM

## 2013-06-21 DIAGNOSIS — Z9181 History of falling: Secondary | ICD-10-CM

## 2013-06-21 DIAGNOSIS — K6289 Other specified diseases of anus and rectum: Secondary | ICD-10-CM

## 2013-06-21 DIAGNOSIS — E871 Hypo-osmolality and hyponatremia: Secondary | ICD-10-CM

## 2013-06-21 DIAGNOSIS — N390 Urinary tract infection, site not specified: Secondary | ICD-10-CM | POA: Diagnosis present

## 2013-06-21 DIAGNOSIS — M1990 Unspecified osteoarthritis, unspecified site: Secondary | ICD-10-CM

## 2013-06-21 DIAGNOSIS — E039 Hypothyroidism, unspecified: Secondary | ICD-10-CM | POA: Diagnosis present

## 2013-06-21 DIAGNOSIS — E7800 Pure hypercholesterolemia, unspecified: Secondary | ICD-10-CM | POA: Diagnosis present

## 2013-06-21 DIAGNOSIS — Z833 Family history of diabetes mellitus: Secondary | ICD-10-CM

## 2013-06-21 DIAGNOSIS — R410 Disorientation, unspecified: Secondary | ICD-10-CM

## 2013-06-21 DIAGNOSIS — I6990 Unspecified sequelae of unspecified cerebrovascular disease: Secondary | ICD-10-CM

## 2013-06-21 DIAGNOSIS — B029 Zoster without complications: Secondary | ICD-10-CM

## 2013-06-21 DIAGNOSIS — I5032 Chronic diastolic (congestive) heart failure: Secondary | ICD-10-CM | POA: Diagnosis present

## 2013-06-21 DIAGNOSIS — Z515 Encounter for palliative care: Secondary | ICD-10-CM

## 2013-06-21 DIAGNOSIS — D649 Anemia, unspecified: Secondary | ICD-10-CM

## 2013-06-21 DIAGNOSIS — A419 Sepsis, unspecified organism: Secondary | ICD-10-CM

## 2013-06-21 DIAGNOSIS — G20 Parkinson's disease: Secondary | ICD-10-CM

## 2013-06-21 DIAGNOSIS — M5030 Other cervical disc degeneration, unspecified cervical region: Secondary | ICD-10-CM

## 2013-06-21 DIAGNOSIS — F0390 Unspecified dementia without behavioral disturbance: Secondary | ICD-10-CM | POA: Diagnosis present

## 2013-06-21 DIAGNOSIS — K228 Other specified diseases of esophagus: Secondary | ICD-10-CM | POA: Diagnosis present

## 2013-06-21 DIAGNOSIS — I639 Cerebral infarction, unspecified: Secondary | ICD-10-CM

## 2013-06-21 DIAGNOSIS — Z823 Family history of stroke: Secondary | ICD-10-CM

## 2013-06-21 DIAGNOSIS — J189 Pneumonia, unspecified organism: Secondary | ICD-10-CM | POA: Diagnosis present

## 2013-06-21 HISTORY — DX: Irritable bowel syndrome, unspecified: K58.9

## 2013-06-21 HISTORY — DX: Vascular dementia, unspecified severity, without behavioral disturbance, psychotic disturbance, mood disturbance, and anxiety: F01.50

## 2013-06-21 HISTORY — DX: Hypo-osmolality and hyponatremia: E87.1

## 2013-06-21 HISTORY — DX: Hypothyroidism, unspecified: E03.9

## 2013-06-21 LAB — URINALYSIS, ROUTINE W REFLEX MICROSCOPIC
Glucose, UA: NEGATIVE mg/dL
Hgb urine dipstick: NEGATIVE
Protein, ur: NEGATIVE mg/dL
Urobilinogen, UA: 0.2 mg/dL (ref 0.0–1.0)

## 2013-06-21 LAB — POCT I-STAT TROPONIN I: Troponin i, poc: 0.01 ng/mL (ref 0.00–0.08)

## 2013-06-21 LAB — POCT I-STAT, CHEM 8
BUN: 16 mg/dL (ref 6–23)
Glucose, Bld: 114 mg/dL — ABNORMAL HIGH (ref 70–99)
HCT: 25 % — ABNORMAL LOW (ref 36.0–46.0)
Hemoglobin: 8.5 g/dL — ABNORMAL LOW (ref 12.0–15.0)
Sodium: 123 mEq/L — ABNORMAL LOW (ref 135–145)
TCO2: 25 mmol/L (ref 0–100)

## 2013-06-21 LAB — CBC WITH DIFFERENTIAL/PLATELET
Eosinophils Absolute: 0 10*3/uL (ref 0.0–0.7)
HCT: 24.5 % — ABNORMAL LOW (ref 36.0–46.0)
Lymphs Abs: 1.2 10*3/uL (ref 0.7–4.0)
MCH: 31.6 pg (ref 26.0–34.0)
Monocytes Absolute: 0.7 10*3/uL (ref 0.1–1.0)
Monocytes Relative: 6 % (ref 3–12)
Neutro Abs: 10.2 10*3/uL — ABNORMAL HIGH (ref 1.7–7.7)
Neutrophils Relative %: 84 % — ABNORMAL HIGH (ref 43–77)
Platelets: 242 10*3/uL (ref 150–400)
RBC: 2.82 MIL/uL — ABNORMAL LOW (ref 3.87–5.11)
WBC: 12.1 10*3/uL — ABNORMAL HIGH (ref 4.0–10.5)

## 2013-06-21 LAB — INFLUENZA PANEL BY PCR (TYPE A & B)
H1N1 flu by pcr: NOT DETECTED
Influenza A By PCR: NEGATIVE
Influenza B By PCR: NEGATIVE

## 2013-06-21 LAB — MRSA PCR SCREENING: MRSA by PCR: POSITIVE — AB

## 2013-06-21 MED ORDER — MORPHINE SULFATE (CONCENTRATE) 10 MG /0.5 ML PO SOLN
5.0000 mg | ORAL | Status: DC | PRN
  Administered 2013-06-22: 5 mg via ORAL
  Filled 2013-06-21: qty 0.5

## 2013-06-21 MED ORDER — GUAIFENESIN-DM 100-10 MG/5ML PO SYRP
10.0000 mL | ORAL_SOLUTION | ORAL | Status: DC | PRN
  Administered 2013-06-21: 18:00:00 10 mL via ORAL
  Filled 2013-06-21: qty 10

## 2013-06-21 MED ORDER — ONDANSETRON HCL 4 MG PO TABS: 4.0000 mg | ORAL_TABLET | Freq: Four times a day (QID) | ORAL | Status: DC | PRN

## 2013-06-21 MED ORDER — AZITHROMYCIN 500 MG PO TABS: 500.0000 mg | ORAL_TABLET | Freq: Every day | ORAL | Status: DC

## 2013-06-21 MED ORDER — POLYETHYLENE GLYCOL 3350 17 G PO PACK
17.0000 g | PACK | Freq: Every day | ORAL | Status: DC | PRN
  Filled 2013-06-21: qty 1

## 2013-06-21 MED ORDER — POLYVINYL ALCOHOL 1.4 % OP SOLN
1.0000 [drp] | OPHTHALMIC | Status: DC | PRN
  Filled 2013-06-21: qty 15

## 2013-06-21 MED ORDER — ACETAMINOPHEN 160 MG/5ML PO SOLN
650.0000 mg | Freq: Four times a day (QID) | ORAL | Status: DC
  Administered 2013-06-21 – 2013-06-23 (×6): 650 mg via ORAL
  Filled 2013-06-21 (×3): qty 20.3

## 2013-06-21 MED ORDER — BENZONATATE 100 MG PO CAPS
100.0000 mg | ORAL_CAPSULE | Freq: Three times a day (TID) | ORAL | Status: DC
  Administered 2013-06-21 – 2013-06-23 (×6): 100 mg via ORAL
  Filled 2013-06-21 (×8): qty 1

## 2013-06-21 MED ORDER — DEXTROSE 5 % IV SOLN: 1.0000 g | INTRAVENOUS | Status: DC

## 2013-06-21 MED ORDER — PHENAZOPYRIDINE HCL 100 MG PO TABS: 100.0000 mg | ORAL_TABLET | Freq: Three times a day (TID) | ORAL | Status: DC

## 2013-06-21 MED ORDER — PINDOLOL 5 MG PO TABS
5.0000 mg | ORAL_TABLET | Freq: Two times a day (BID) | ORAL | Status: DC
  Administered 2013-06-21 – 2013-06-22 (×3): 5 mg via ORAL
  Filled 2013-06-21 (×4): qty 1

## 2013-06-21 MED ORDER — DEXTROSE 5 % IV SOLN: 500.0000 mg | Freq: Once | INTRAVENOUS | Status: DC

## 2013-06-21 MED ORDER — ACETAMINOPHEN 325 MG PO TABS: 650.0000 mg | ORAL_TABLET | Freq: Four times a day (QID) | ORAL | Status: DC | PRN

## 2013-06-21 MED ORDER — INFLUENZA VAC SPLIT QUAD 0.5 ML IM SUSP: 0.5000 mL | INTRAMUSCULAR | Status: DC

## 2013-06-21 MED ORDER — SODIUM CHLORIDE 0.9 % IV SOLN
3.0000 g | Freq: Three times a day (TID) | INTRAVENOUS | Status: DC
  Administered 2013-06-21: 09:00:00 3 g via INTRAVENOUS
  Filled 2013-06-21 (×2): qty 3

## 2013-06-21 MED ORDER — CHLORHEXIDINE GLUCONATE CLOTH 2 % EX PADS
6.0000 | MEDICATED_PAD | Freq: Every day | CUTANEOUS | Status: DC
  Administered 2013-06-23: 6 via TOPICAL

## 2013-06-21 MED ORDER — SODIUM CHLORIDE 0.9 % IJ SOLN
3.0000 mL | Freq: Two times a day (BID) | INTRAMUSCULAR | Status: DC
  Administered 2013-06-22 – 2013-06-23 (×2): 3 mL via INTRAVENOUS

## 2013-06-21 MED ORDER — ONDANSETRON HCL 4 MG/2ML IJ SOLN: 4.0000 mg | Freq: Four times a day (QID) | INTRAMUSCULAR | Status: DC | PRN

## 2013-06-21 MED ORDER — SODIUM CHLORIDE 0.9 % IV SOLN
INTRAVENOUS | Status: AC
  Administered 2013-06-21: 08:00:00 via INTRAVENOUS

## 2013-06-21 MED ORDER — ENSURE COMPLETE PO LIQD
237.0000 mL | Freq: Two times a day (BID) | ORAL | Status: DC
  Administered 2013-06-21 – 2013-06-22 (×2): 237 mL via ORAL

## 2013-06-21 MED ORDER — VITAMIN B-12 1000 MCG PO TABS
1000.0000 ug | ORAL_TABLET | Freq: Every morning | ORAL | Status: DC
  Administered 2013-06-21 – 2013-06-22 (×2): 1000 ug via ORAL
  Filled 2013-06-21 (×2): qty 1

## 2013-06-21 MED ORDER — HALOPERIDOL LACTATE 5 MG/ML IJ SOLN: 1.0000 mg | INTRAMUSCULAR | Status: DC | PRN

## 2013-06-21 MED ORDER — ENSURE COMPLETE PO LIQD
237.0000 mL | Freq: Every day | ORAL | Status: DC
  Administered 2013-06-21: 12:00:00 237 mL via ORAL

## 2013-06-21 MED ORDER — MUPIROCIN 2 % EX OINT
1.0000 "application " | TOPICAL_OINTMENT | Freq: Two times a day (BID) | CUTANEOUS | Status: DC
  Administered 2013-06-21 – 2013-06-23 (×5): 1 via NASAL
  Filled 2013-06-21: qty 22

## 2013-06-21 MED ORDER — METHYLCELLULOSE 1 % OP SOLN: 1.0000 [drp] | OPHTHALMIC | Status: DC | PRN

## 2013-06-21 MED ORDER — DEXTROSE 5 % IV SOLN: 1.0000 g | Freq: Once | INTRAVENOUS | Status: DC

## 2013-06-21 MED ORDER — ENOXAPARIN SODIUM 40 MG/0.4ML ~~LOC~~ SOLN
40.0000 mg | Freq: Every day | SUBCUTANEOUS | Status: DC
Start: 2013-06-21 — End: 2013-06-21
  Administered 2013-06-21: 12:00:00 40 mg via SUBCUTANEOUS
  Filled 2013-06-21 (×2): qty 0.4

## 2013-06-21 MED ORDER — SENNOSIDES-DOCUSATE SODIUM 8.6-50 MG PO TABS: 1.0000 | ORAL_TABLET | Freq: Every evening | ORAL | Status: DC | PRN

## 2013-06-21 MED ORDER — LEVOTHYROXINE SODIUM 75 MCG PO TABS
75.0000 ug | ORAL_TABLET | Freq: Every morning | ORAL | Status: DC
  Administered 2013-06-21 – 2013-06-23 (×3): 75 ug via ORAL
  Filled 2013-06-21 (×3): qty 1

## 2013-06-21 MED ORDER — ACETAMINOPHEN 650 MG RE SUPP: 650.0000 mg | Freq: Four times a day (QID) | RECTAL | Status: DC | PRN

## 2013-06-21 MED ORDER — SORBITOL 70 % SOLN
30.0000 mL | Freq: Every day | Status: DC
  Administered 2013-06-21 – 2013-06-22 (×2): 30 mL via ORAL
  Filled 2013-06-21 (×3): qty 30

## 2013-06-21 MED ORDER — PANTOPRAZOLE SODIUM 40 MG PO TBEC
40.0000 mg | DELAYED_RELEASE_TABLET | Freq: Every day | ORAL | Status: DC
  Administered 2013-06-21 – 2013-06-23 (×3): 40 mg via ORAL
  Filled 2013-06-21 (×3): qty 1

## 2013-06-21 MED ORDER — ASPIRIN EC 325 MG PO TBEC
325.0000 mg | DELAYED_RELEASE_TABLET | Freq: Every day | ORAL | Status: DC
  Administered 2013-06-21: 325 mg via ORAL
  Filled 2013-06-21: qty 1

## 2013-06-21 NOTE — ED Notes (Signed)
Bed: ZO10 Expected date:  Expected time:  Means of arrival:  Comments: EMS 79yo Weakness, near syncope

## 2013-06-21 NOTE — Progress Notes (Signed)
ANTIBIOTIC CONSULT NOTE - INITIAL  Pharmacy Consult for Unasyn  Indication: CAP vs aspiration PNA  No Active Allergies  Patient Measurements: Height: 5\' 6"  (167.6 cm) Weight: 119 lb 4.3 oz (54.1 kg) IBW/kg (Calculated) : 59.3   Vital Signs: Temp: 98.3 F (36.8 C) (11/09 0659) Temp src: Oral (11/09 0659) BP: 126/47 mmHg (11/09 0659) Pulse Rate: 93 (11/09 0659)  Labs:  Recent Labs  06/21/13 0450 06/21/13 0459  WBC 12.1*  --   HGB 8.9* 8.5*  PLT 242  --   CREATININE  --  0.90   Estimated Creatinine Clearance: 37.6 ml/min (by C-G formula based on Cr of 0.9).  Medical History: Past Medical History  Diagnosis Date  . Unspecified sinusitis (chronic)   . Unspecified essential hypertension   . Cerebrovascular disease, unspecified     hx of multiple strokes  . Unspecified venous (peripheral) insufficiency   . Pure hypercholesterolemia   . Hypothyroidism   . Esophageal reflux   . Unspecified constipation   . Urinary tract infection, site not specified   . Osteoarthrosis, unspecified whether generalized or localized, unspecified site   . Degeneration of cervical intervertebral disc   . Closed fracture of other bone of wrist   . Anxiety state, unspecified   . Idiopathic urticaria   . Anemia, unspecified   . Other B-complex deficiencies   . Herpes zoster without mention of complication   . IBS (irritable bowel syndrome)   . Vascular dementia     wtih sundowning  . Hyponatremia     Microbiology: 11/9 urine: collected 11/9: UA shows 7-10 WBC, small leukocytes, few squams  Assessment: 31 yoF [facility] from [facility] with near syncope and dysuria. Patient was just seen in ED 11/5 after a fall. She also has a Hx recurrent aspiration pneumonia secondary to dysphagia. Pharmacy has been consulted to dose Unasyn for suspected aspiration pneumonia  Tm/24h 101.3  WBC elevated at 12.1  SCr 0.9 (slightly increased from patient's baseline, CrCl 37 ml/min,  45 ml/min normalized  Noted patient also starting on azithromycin per MD for atypical coverage for suspected CAP  Goal of Therapy:  eradication of infection  Plan:  - start ampicillin/sulbactam 3g IV q8h - follow-up culture results, clinical course, renal function  Thank you for the consult.  Tomi Bamberger, PharmD Clinical Pharmacist Pager: [phone] Pharmacy: [phone] 06/21/2013 8:46 AM

## 2013-06-21 NOTE — ED Notes (Signed)
Pt arrived from an assisted living center with a complaint of near syncope.  Pt called the from desk when she could not get off of the toilet. Pt was seen three days from this date for a fall.  Pt states she feels weak all the time.  Pt states that it burns when she urinates

## 2013-06-21 NOTE — Consult Note (Addendum)
Palliative Medicine Team Consult Note  Margaret [name] has advanced vascular dementia, she has been living in an ILF with paid caregivers and community resources and the support of family checking in on her frequently. She has had increasing weakness and this is her 4 admission this year and she was just seen in the ED on 11/5 after a serious fall with scalp contusion. She was admitted with AMS. She complains of a severe HA and right leg pain that has been getting worse-PTA she was too wak to get off her toilet and called 911. She was febrile and SOB on arrival and having nausea vomiting. She has known silent aspiration, esohageal strictures and severe dysphagia with recurrent PNA and flares of COPD. He QOL has deteriorated significantly over the past 2-3 months. PMT asked to consult for goals of care. This is the thrid time we have met with this patient and family this year- she however seems to be in a different state of her illness than on our previous consults.  Summary of Goals:  I met both with and away from the patient to discuss goals- I do not think [name] has full capacity to make her own decisions even though in the right moment she can articulate what it is she needs and wants as well as her preferences. She tells me she just wants to be at home-she had rather die than be in a nursing home. She knows that falling means that she is unsafe to be alone. She does not have insight into how sick or debilitated she really is at this point.   1. DNR  2. Comfort and QOL are primary goals-patient does not want to be the "sick person" per her daughter and never wants to be in a nursing home. Daughter is very interested in Hospice care and given her mother's frequent readmissions not electing to come back to hospital but transitioning to comfort care the next time her mother gets Aspiration PNA or declines. She also would like for comfort measures to begin now and de-escalation of her medical intervention to  not unnecessarily prolong her mothers life given the current quality level.  Recommendations:  1. I recommend getting her pain (from her fall and scalp contusion) under better control as well as evaluating her evening agitation. Will add on scheduled Tylenol, use roxanol prn and haldol prn for agitation.  2. Stop antibiotics, invasive/uncomfortable testing-diagnostics and treat for symptoms only.  3. Will evaluate in the next 24-48 hours-she will either need to go home with Hospice and her family/paid caregivers or directly to a residential hospice facility after discharge- depends on her needs- will see what her mobility status is- her right thigh pain and inability to bear weight are concerning for occult hip fracture-so if this continues I will get films on that (she moves the hip but does have some pain on palpation of her thigh-may be muscle injury). Mobility will be a discharge issue in terms of caregiver needs- she is going to nee 24/hour supervision for the most part.  50 minutes. Greater than 50%  of this time was spent counseling and coordinating care related to the above assessment and plan.   Anderson Malta, DO Palliative Medicine

## 2013-06-21 NOTE — Progress Notes (Signed)
CRITICAL VALUE ALERT  Critical value received:  + MRSA   Date of notification:  06/21/2013  Time of notification:  1356  Critical value read back:yes  Nurse who received alert:  Joaquin Music, Rn  MD notified (1st page):  M. Short  Time of first page:  1359  MD notified (2nd page):  Time of second page:  Responding MD:  Short  Time MD responded:  1359

## 2013-06-21 NOTE — ED Provider Notes (Signed)
CSN: [id number]     Arrival date & time 06/21/13  0353 History   First MD Initiated Contact with Patient 06/21/13 [phone]     Chief Complaint  Patient presents with  . Near Syncope   (Consider location/radiation/quality/duration/timing/severity/associated sxs/prior Treatment) Patient is a 79 y.o. female presenting with dysuria. The history is provided by the EMS personnel and the patient.  Dysuria Pain quality:  Burning Onset quality:  Gradual Timing:  Constant Progression:  Unchanged Chronicity:  Recurrent Relieved by:  Nothing Worsened by:  Nothing tried Ineffective treatments:  None tried Urinary symptoms: frequent urination   Associated symptoms: no fever   Risk factors: no urinary catheter   EMS called out for patient unable to get up from commode.  Felt like she was going to pass out.  Didn't.  But wanted transport for evaluation.    Past Medical History  Diagnosis Date  . Unspecified sinusitis (chronic)   . Unspecified essential hypertension   . Cerebrovascular disease, unspecified   . Unspecified venous (peripheral) insufficiency   . Pure hypercholesterolemia   . Unspecified disorder of thyroid   . Esophageal reflux   . Unspecified constipation   . Urinary tract infection, site not specified   . Osteoarthrosis, unspecified whether generalized or localized, unspecified site   . Degeneration of cervical intervertebral disc   . Closed fracture of other bone of wrist   . Anxiety state, unspecified   . Idiopathic urticaria   . Anemia, unspecified   . Other B-complex deficiencies   . Herpes zoster without mention of complication   . Stroke    Past Surgical History  Procedure Laterality Date  . Tonsillectomy    . Total abdominal hysterectomy w/ bilateral salpingoophorectomy    . Cervical fusion    . Cataract extraction    . Nasal sinus surgery    . Esophagogastroduodenoscopy  09/10/2011    Procedure: ESOPHAGOGASTRODUODENOSCOPY (EGD);  Surgeon: Petra Kuba, MD;   Location: [facility] ENDOSCOPY;  Service: Endoscopy;  Laterality: N/A;  c-arm needed  . Savory dilation  09/10/2011    Procedure: SAVORY DILATION;  Surgeon: Petra Kuba, MD;  Location: [facility] ENDOSCOPY;  Service: Endoscopy;  Laterality: N/A;  . Esophagogastroduodenoscopy  05/13/2012    Procedure: ESOPHAGOGASTRODUODENOSCOPY (EGD);  Surgeon: Petra Kuba, MD;  Location: [facility] ENDOSCOPY;  Service: Endoscopy;  Laterality: N/A;  with carm  . Savory dilation  05/13/2012    Procedure: SAVORY DILATION;  Surgeon: Petra Kuba, MD;  Location: [facility] ENDOSCOPY;  Service: Endoscopy;  Laterality: N/A;   Family History  Problem Relation Age of Onset  . Diabetes Father   . Heart failure Mother   . Diabetes Sister    History  Substance Use Topics  . Smoking status: Never Smoker   . Smokeless tobacco: Never Used  . Alcohol Use: No   OB History   Grav Para Term Preterm Abortions TAB SAB Ect Mult Living                 Review of Systems  Constitutional: Positive for fatigue. Negative for fever.  Respiratory: Negative for shortness of breath.   Cardiovascular: Negative for chest pain.  Genitourinary: Positive for dysuria.  Neurological: Negative for weakness.  All other systems reviewed and are negative.    Allergies  Aspirin  Home Medications   Current Outpatient Rx  Name  Route  Sig  Dispense  Refill  . acetaminophen (TYLENOL) 325 MG tablet   Oral   Take 2 tablets (650 mg  total) by mouth every 6 (six) hours as needed.   30 tablet   0   . aspirin EC 325 MG tablet   Oral   Take 1 tablet (325 mg total) by mouth daily.   30 tablet   0   . cyanocobalamin 1000 MCG tablet   Oral   Take 1,000 mcg by mouth every morning.          . feeding supplement (ENSURE COMPLETE) LIQD   Oral   Take 237 mLs by mouth daily.         . lansoprazole (PREVACID) 30 MG capsule   Oral   Take 30 mg by mouth daily.          Marland Kitchen levothyroxine (SYNTHROID, LEVOTHROID) 75 MCG tablet   Oral   Take 75 mcg by mouth every  morning.          . methylcellulose (ARTIFICIAL TEARS) 1 % ophthalmic solution   Both Eyes   Place 1 drop into both eyes as needed.         . pindolol (VISKEN) 5 MG tablet   Oral   Take 1 tablet (5 mg total) by mouth 2 (two) times daily.   60 tablet   5   . senna-docusate (SENOKOT-S) 8.6-50 MG per tablet   Oral   Take 1 tablet by mouth at bedtime as needed for mild constipation. Take 1-2 tablet by mouth at bedtime as needed          BP 174/71  Pulse 104  Temp(Src) 98.5 F (36.9 C) (Oral)  Resp 20  SpO2 93% Physical Exam  Constitutional: She appears well-developed and well-nourished. No distress.  HENT:  Head: Normocephalic and atraumatic.  Mouth/Throat: Oropharynx is clear and moist.  Eyes: Conjunctivae are normal. Pupils are equal, round, and reactive to light.  Neck: Normal range of motion. Neck supple.  Cardiovascular: Normal rate, regular rhythm and intact distal pulses.   Pulmonary/Chest: Effort normal and breath sounds normal. No stridor. She has no wheezes. She has no rales.  Abdominal: Soft. Bowel sounds are normal. There is no tenderness. There is no rebound and no guarding.  Musculoskeletal: Normal range of motion. She exhibits no edema.  Neurological: She is alert. She has normal reflexes.  Skin: Skin is warm and dry.  Psychiatric: She has a normal mood and affect.    ED Course  Procedures (including critical care time) Labs Review Labs Reviewed  CBC WITH DIFFERENTIAL  URINALYSIS, ROUTINE W REFLEX MICROSCOPIC   Imaging Review No results found.  EKG Interpretation   None       MDM  No diagnosis found.  Date: 06/21/2013  Rate: 103  Rhythm: sinus tachycardia  QRS Axis: normal  Intervals: normal  ST/T Wave abnormalities: normal  Conduction Disutrbances:none  Narrative Interpretation:   Old EKG Reviewed: unchanged      Zeniah Briney K Daton Szilagyi-Rasch, MD 06/21/13 [phone]

## 2013-06-21 NOTE — Progress Notes (Signed)
Pt is from [facility], which is an Independent Living facility.  Daughter intends for Pt to return there.  No CSW needs.  Providence Crosby, LCSWA Clinical Social Work [phone]

## 2013-06-21 NOTE — H&P (Signed)
[facility] History and Physical  Margaret [name] ZOX:[id number] DOB: Nov 12, 1923 DOA: 06/21/2013  Referring physician:  April Palumbo-Rasch PCP:  Michele Mcalpine, MD   Chief Complaint:  Falls, weakness, dysuria, increased cough  HPI:  The patient is a 79 y.o. year-old female with history of vascular/senile dementia with sundowning, chronic hyponatremia, dysphagia with recurrent aspiration pneumonia, HTN, HLD, chronic diastolic heart failure, B12 deficiency anemia, hypothyroidism who presents with multiple falls, dysuria, increased cough.  The patient was last at their baseline health a couple weeks ago.  For the last two weeks, she has had increased cough and progressive weakness.  She was seen in the ER two days ago because of a fall backwards which resulted in a scalp hematoma.  She was discharged back to independent living where she is cared for by independent aids and followed by palliative care.  She has had progressive weakness.  Last night, she had some nausea without vomiting and noticed some dysuria without urgency or frequency.  This morning, she felt sick to her stomach and may have had one small emesis.  She went to the bathroom and was too weak to get off the toilet.  She felt presyncopal and was transported by EMS to the ER.  Per family, she has had increased slurred speech and generalized weakness without increased facial droop or focal weakness of arm or leg over the last week.    In the ER, she had a fever of 101.51F, mild tachycardia, 126/47 and oxygen saturations 93-94% on RA.  Her WBC was 12.1, hemoglobin 8.9mg /dl (baseline 9), sodium 811 (slightly lower than baseline of 125-128), chloride 87.  UA with small LE and 7-10 WBC.  CXR demonstrated mild lower lobe opacity suggestive of pneumonia and chronic peribronchial thickening suggestive of COPD.  Her head CT demonstrated stable age-related atrophy and chronic microvascular ischemic disease with decreased right parietal scalp  contusion.  She is being admitted for aspiration vs. CAP pneumonia.    Review of Systems:  General:  + fevers and chills HEENT:  Denies changes to hearing and vision, + rhinorrhea, sinus congestion, and sore throat CV:  Denies chest pain and palpitations, lower extremity edema.  PULM:  + SOB and nonproductive cough without wheeze GI:  + nausea, vomiting, intermittent constipation and diarrhea.   GU:  + dysuria, without frequency, urgency ENDO:  Denies polyuria, polydipsia.   HEME:  Denies hematemesis, blood in stools, melena, abnormal bruising or bleeding.  LYMPH:  Denies lymphadenopathy.   MSK:  Denies arthralgias, myalgias.   DERM:  Denies skin rash or ulcer.   NEURO:  Denies focal numbness, weakness, slurred speech, confusion, facial droop.  PSYCH:  Denies anxiety and depression.    Past Medical History  Diagnosis Date  . Unspecified sinusitis (chronic)   . Unspecified essential hypertension   . Cerebrovascular disease, unspecified     hx of multiple strokes  . Unspecified venous (peripheral) insufficiency   . Pure hypercholesterolemia   . Hypothyroidism   . Esophageal reflux   . Unspecified constipation   . Urinary tract infection, site not specified   . Osteoarthrosis, unspecified whether generalized or localized, unspecified site   . Degeneration of cervical intervertebral disc   . Closed fracture of other bone of wrist   . Anxiety state, unspecified   . Idiopathic urticaria   . Anemia, unspecified   . Other B-complex deficiencies   . Herpes zoster without mention of complication   . IBS (irritable bowel syndrome)   . Vascular  dementia     wtih sundowning  . Hyponatremia    Past Surgical History  Procedure Laterality Date  . Tonsillectomy    . Total abdominal hysterectomy w/ bilateral salpingoophorectomy    . Cervical fusion    . Cataract extraction    . Nasal sinus surgery    . Esophagogastroduodenoscopy  09/10/2011    Procedure: ESOPHAGOGASTRODUODENOSCOPY  (EGD);  Surgeon: Petra Kuba, MD;  Location: [facility] ENDOSCOPY;  Service: Endoscopy;  Laterality: N/A;  c-arm needed  . Savory dilation  09/10/2011    Procedure: SAVORY DILATION;  Surgeon: Petra Kuba, MD;  Location: [facility] ENDOSCOPY;  Service: Endoscopy;  Laterality: N/A;  . Esophagogastroduodenoscopy  05/13/2012    Procedure: ESOPHAGOGASTRODUODENOSCOPY (EGD);  Surgeon: Petra Kuba, MD;  Location: [facility] ENDOSCOPY;  Service: Endoscopy;  Laterality: N/A;  with carm  . Savory dilation  05/13/2012    Procedure: SAVORY DILATION;  Surgeon: Petra Kuba, MD;  Location: [facility] ENDOSCOPY;  Service: Endoscopy;  Laterality: N/A;   Social History:  reports that she has never smoked. She has never used smokeless tobacco. She reports that she does not drink alcohol or use illicit drugs. Lives in independent living but has people going in and out.  Daughter states that she needs more assistance than independent living, but they are trying to make up for it with other services.  Palliative care follows.  Uses a walker.    No Active Allergies  Family History  Problem Relation Age of Onset  . Diabetes Father   . Heart failure Mother   . Diabetes Sister     multiple  . Stroke Sister     multiple  . Stroke Mother      Prior to Admission medications   Medication Sig Start Date End Date Taking? Authorizing Provider  acetaminophen (TYLENOL) 325 MG tablet Take 2 tablets (650 mg total) by mouth every 6 (six) hours as needed. 06/17/13  Yes Candyce Churn, MD  cyanocobalamin 1000 MCG tablet Take 1,000 mcg by mouth every morning.    Yes Historical Provider, MD  feeding supplement (ENSURE COMPLETE) LIQD Take 237 mLs by mouth daily. 04/28/12  Yes Meredeth Ide, MD  lansoprazole (PREVACID) 30 MG capsule Take 30 mg by mouth every morning.    Yes Historical Provider, MD  levothyroxine (SYNTHROID, LEVOTHROID) 75 MCG tablet Take 75 mcg by mouth every morning.    Yes Historical Provider, MD  methylcellulose (ARTIFICIAL TEARS) 1 %  ophthalmic solution Place 1 drop into both eyes as needed (dry eyes).    Yes Historical Provider, MD  pindolol (VISKEN) 5 MG tablet Take 1 tablet (5 mg total) by mouth 2 (two) times daily. 03/11/13  Yes Michele Mcalpine, MD  senna-docusate (SENOKOT-S) 8.6-50 MG per tablet Take 1 tablet by mouth at bedtime as needed for mild constipation. Take 1-2 tablet by mouth at bedtime as needed 04/28/12  Yes Meredeth Ide, MD   Physical Exam: Filed Vitals:   06/21/13 0402 06/21/13 0641 06/21/13 0659  BP: 174/71 154/68 126/47  Pulse: 104 95 93  Temp: 98.5 F (36.9 C) 101.3 F (38.5 C) 98.3 F (36.8 C)  TempSrc: Oral Rectal Oral  Resp: 20 20 18   Height:   5\' 6"  (1.676 m)  Weight:   54.1 kg (119 lb 4.3 oz)  SpO2: 93% 94% 94%     General:  Cachectic CF, NAD  Eyes:  PERRL, anicteric, non-injected.  ENT:  Nares clear.  OP clear, non-erythematous without plaques or  exudates.  MMM.  Neck:  Supple without TM or JVD.    Lymph:  No cervical, supraclavicular, or submandibular LAD.  Cardiovascular:  RRR, soft S1 and S2, with 3/6 systolic murmur at RSB and heard across precordium,  2+ pulses, warm extremities  Respiratory:  CTA bilaterally without increased WOB.  Abdomen:  NABS.  Soft, ND/NT.    Skin:  No rashes or focal lesions.  Musculoskeletal:  Normal bulk and tone.  No LE edema.  Psychiatric:  A & O x to person.  Appropriate affect.  Neurologic:  CN 3-12:  Mild deviation of tongue to the left, 4/5 strength throughout,  Sensation intact to light touch  Labs on Admission:  Basic Metabolic Panel:  Recent Labs Lab 06/21/13 0459  NA 123*  K 4.0  CL 87*  GLUCOSE 114*  BUN 16  CREATININE 0.90   Liver Function Tests: No results found for this basename: AST, ALT, ALKPHOS, BILITOT, PROT, ALBUMIN,  in the last 168 hours No results found for this basename: LIPASE, AMYLASE,  in the last 168 hours No results found for this basename: AMMONIA,  in the last 168 hours CBC:  Recent Labs Lab  06/21/13 0450 06/21/13 0459  WBC 12.1*  --   NEUTROABS 10.2*  --   HGB 8.9* 8.5*  HCT 24.5* 25.0*  MCV 86.9  --   PLT 242  --    Cardiac Enzymes: No results found for this basename: CKTOTAL, CKMB, CKMBINDEX, TROPONINI,  in the last 168 hours  BNP (last 3 results)  Recent Labs  08/14/12 1134  PROBNP 1779.0*   CBG: No results found for this basename: GLUCAP,  in the last 168 hours  Radiological Exams on Admission: Dg Chest 2 View  06/21/2013   CLINICAL DATA:  Weakness and syncope.  EXAM: CHEST  2 VIEW  COMPARISON:  Chest radiograph performed 03/06/2013  FINDINGS: The lungs are hyperexpanded, with flattening of the hemidiaphragms, compatible with COPD. Mild lower lobe opacity is suggested on the lateral view, raising question for mild pneumonia. Chronic peribronchial thickening is noted. No definite pleural effusion or pneumothorax is seen.  The heart is mildly enlarged. No acute osseous abnormalities are seen.  IMPRESSION: 1. Mild lower lobe opacity suggested on the lateral view, raising question for mild pneumonia. 2. Findings of COPD; mild cardiomegaly noted.   Electronically Signed   By: Roanna Raider M.D.   On: 06/21/2013 05:04   Ct Head Wo Contrast  06/21/2013   CLINICAL DATA:  Increasing weakness, recent fall  EXAM: CT HEAD WITHOUT CONTRAST  TECHNIQUE: Contiguous axial images were obtained from the base of the skull through the vertex without intravenous contrast.  COMPARISON:  Prior CT from 06/17/2013  FINDINGS: Previously seen high right parietal scalp contusion is again noted, decreased in size relative to prior examination.  Diffuse age-related atrophy and chronic microvascular ischemic disease is not significantly changed relative to the prior examination. No large vessel territory infarct. There is no acute intracranial hemorrhage. No midline shift or mass lesion. No extra-axial fluid collection.  Calvarium is intact.  Orbits are normal. No mastoid effusion. Minimal  circumferential opacities seen within the partially visualized maxillary sinuses bilaterally.  IMPRESSION: 1. Stable appearance of age-related atrophy and chronic microvascular ischemic disease. No acute intracranial process identified. 2. Interval decrease in size of right parietal scalp contusion.   Electronically Signed   By: Rise Mu M.D.   On: 06/21/2013 05:42    EKG: Independently reviewed. Sinus tachycardia with Q-waves in  V1, V2 suggestive of previous septal infarct  Assessment/Plan Active Problems:   VITAMIN B12 DEFICIENCY   HYPERCHOLESTEROLEMIA   HYPERTENSION   CEREBROVASCULAR DISEASE   GERD   UTI (lower urinary tract infection)   Hyponatremia   Chronic diastolic CHF (congestive heart failure)   Aspiration pneumonia   Senile dementia, uncomplicated   Generalized weakness   Recurrent falls  ---  Sepsis due to aspiration pneumonia vs. CAP, the former more likely given her history of dysphagia.  Last admission was in 02/2013 (> 53mo ago) -  Change ceftriaxone to unasyn for better aspiration coverage -  Continue azithromycin for possible CAP -  Check flu PCR, droplet precautions pending result -  Check legionella and S. pneumo ag -  F/u blood cultures  Dysuria suggestive of UTI despite relatively benign UA -  Add on urine culture -  Unable to give pyridium due to decreased GFR  Generalized weakness with increased falls, may be due to infection, deconditioning, however, she has hx of recurrent strokes also -  Consider repeat MRI, however, unclear if this will change management -  Continue FD ASA -  PT/OT evaluations  Dysphagia likely secondary to previous CVA.  Previous assessments demonstrated frank aspiration.  Family aware and do not want feeding tube or thickened liquids because she doesn't drink them and becomes dehydrated. -  Continue dysphagia 1 diet with thin liquids -  Ensure supplements -  Continue ongoing palliative care conversation (consult placed)  esp given two admissions in the last 4 months  Chronic hyponatremia, slightly worse than baseline, likely related to dehydration -  Gentle hydration -  Repeat in AM  Presyncope likely related to dehydration and infection.  Abx and IVF.  GERD, stable.  Continue PPI HTN, stable.  Continue BB CVA, chronic.  Continue FD ASA Chronic diastolic heart failure, appears hypovolemic.  Judicious hydration Normocytic anemia, chronic but hgb drifting down over last few months.  Check iron, B12, folate, TSH  Diet:  Dysphagia 1 with thin Access:  PIV IVF:  yes Proph:  lovenox  Code Status: DNR Family Communication: patient and her daughter Disposition Plan: Admit to med-surg  Time spent: 60 min Renae Fickle [facility] Pager [phone]  If 7PM-7AM, please contact night-coverage [email or web address] Password [email or web address] 06/21/2013, 8:31 AM

## 2013-06-22 LAB — LEGIONELLA ANTIGEN, URINE: Legionella Antigen, Urine: NEGATIVE

## 2013-06-22 MED ORDER — HALOPERIDOL 1 MG PO TABS: 1.0000 mg | ORAL_TABLET | Freq: Four times a day (QID) | ORAL | Status: DC | PRN

## 2013-06-22 MED ORDER — BENZONATATE 100 MG PO CAPS: 100.0000 mg | ORAL_CAPSULE | Freq: Three times a day (TID) | ORAL | Status: DC

## 2013-06-22 MED ORDER — SORBITOL 70 % SOLN: 30.0000 mL | Freq: Every day | Status: AC

## 2013-06-22 MED ORDER — INFLUENZA VAC SPLIT QUAD 0.5 ML IM SUSP
0.5000 mL | Freq: Once | INTRAMUSCULAR | Status: AC
  Administered 2013-06-22: 0.5 mL via INTRAMUSCULAR
  Filled 2013-06-22 (×2): qty 0.5

## 2013-06-22 MED ORDER — HALOPERIDOL 1 MG PO TABS
1.0000 mg | ORAL_TABLET | Freq: Four times a day (QID) | ORAL | Status: DC | PRN
  Filled 2013-06-22: qty 1

## 2013-06-22 MED ORDER — POLYETHYLENE GLYCOL 3350 17 G PO PACK: 17.0000 g | PACK | Freq: Every day | ORAL | Status: DC | PRN

## 2013-06-22 MED ORDER — MORPHINE SULFATE (CONCENTRATE) 10 MG /0.5 ML PO SOLN: 5.0000 mg | ORAL | Status: AC | PRN

## 2013-06-22 NOTE — Progress Notes (Signed)
Palliative Medicine Team Progress Note   S: Margaret [name] is sitting up in geri chair beside her bed-she complains of ongoing difficulty swallowing. She says "you all are making me sicker than I really am". Limited insight into her condition. Pain in thigh is better today-scheduled Tylenol given. Agitation in evenings.  Filed Vitals:   06/22/13 1407  BP: 126/51  Pulse: 80  Temp:   Resp: 18   Alert, oriented to person and place- "I want to go home" Abdomen is soft and non-tender.    Assessment: Terminal dysphagia from presbyesophagus and recurrent aspiration PNA. Family has elected for comfort care and Hospice at discharge with intention of no readmission if she deteriorates.  Plan: Home with hospice when medically stable-could consider changing pindolol over to low dose CCB (diltiazem) which may help with esphageal spasm. Continue Tylenol-Roxanol for severe pain. Sorbitol qhs for constipation(ongoing chronic issue for her). Transition as many of her meds over to liquid form as possible.   Time: 25 minutes. Greater than 50%  of this time was spent counseling and coordinating care related to the above assessment and plan.    Anderson Malta, DO Palliative Medicine

## 2013-06-22 NOTE — Evaluation (Signed)
Occupational Therapy Evaluation Patient Details Name: ILLA ENLOW MRN: [id number] DOB: [birth year] Today's Date: 06/22/2013 Time: 1308-6578 OT Time Calculation (min): 36 min  OT Assessment / Plan / Recommendation History of present illness The patient is a 79 y.o. year-old female with history of vascular/senile dementia with sundowning, chronic hyponatremia, dysphagia with recurrent aspiration pneumonia, HTN, HLD, chronic diastolic heart failure, B12 deficiency anemia, hypothyroidism who presents with multiple falls, dysuria, increased cough.    Clinical Impression   Pt up to [facility] and participated in toileting tasks. Pt limited by L ankle/LE pain. Will benefit from skilled OT services to maximize ADL independence for d/c home with family/caregivers.    OT Assessment  Patient needs continued OT Services    Follow Up Recommendations  Home health OT;Supervision/Assistance - 24 hour    Barriers to Discharge      Equipment Recommendations  3 in 1 bedside comode (per family though pt declined 3in1 in past)    Recommendations for Other Services    Frequency  Min 2X/week    Precautions / Restrictions Precautions Precautions: Fall Restrictions Weight Bearing Restrictions: No   Pertinent Vitals/Pain L ankle pain. Reposition, PT informed nursing.    ADL  Eating/Feeding: Simulated;Independent Where Assessed - Eating/Feeding: Chair Grooming: Performed;Brushing hair;Set up Where Assessed - Grooming: Supported sitting Upper Body Bathing: Simulated;Chest;Right arm;Left arm;Abdomen;Set up;Supervision/safety Where Assessed - Upper Body Bathing: Unsupported sitting Lower Body Bathing: Simulated;Moderate assistance Where Assessed - Lower Body Bathing: Supported sit to stand Upper Body Dressing: Simulated;Set up Where Assessed - Upper Body Dressing: Unsupported sitting Lower Body Dressing: Simulated;Moderate assistance Where Assessed - Lower Body Dressing: Supported sit to Solicitor: Performed;Minimal Dentist Method: Surveyor, minerals: Materials engineer and Hygiene: Performed;Moderate assistance (due to had BM in bed and needed assist to clean thoroughly once up from [facility]) Where Assessed - Toileting Clothing Manipulation and Hygiene: Sit to stand from 3-in-1 or toilet Equipment Used: Gait belt;Rolling walker ADL Comments: Pt limited by L ankle/LE pain.     OT Diagnosis: Generalized weakness;Acute pain  OT Problem List: Decreased strength;Decreased knowledge of use of DME or AE;Pain OT Treatment Interventions: Self-care/ADL training;DME and/or AE instruction;Therapeutic activities;Patient/family education   OT Goals(Current goals can be found in the care plan section) Acute Rehab OT Goals Patient Stated Goal: to return to independent living facility with assistance OT Goal Formulation: With patient/family Time For Goal Achievement: 07/06/13 Potential to Achieve Goals: Good  Visit Information  Last OT Received On: 06/22/13 Assistance Needed: +1 PT/OT Co-Evaluation/Treatment: Yes History of Present Illness: The patient is a 79 y.o. year-old female with history of vascular/senile dementia with sundowning, chronic hyponatremia, dysphagia with recurrent aspiration pneumonia, HTN, HLD, chronic diastolic heart failure, B12 deficiency anemia, hypothyroidism who presents with multiple falls, dysuria, increased cough.        Prior Functioning     Home Living Family/patient expects to be discharged to:: Other (Comment) (independent liveing at [facility]) Additional Comments: family assists pt 2-3 days/week, also has aide as needed Prior Function Level of Independence: Needs assistance Gait / Transfers Assistance Needed: used RW - pt stated she walked with RW independently  ADL's / Homemaking Assistance Needed: hired help comes 2-3x/wk to assist with shower. pt states she could usually dress  herself. meals brought to her.  Comments: pt has a toilet riser with handles and a shower chair with walk in shower. always has help with shower. Communication Communication: HOH Dominant Hand: Right  Vision/Perception     Cognition  Cognition Arousal/Alertness: Awake/alert Behavior During Therapy: WFL for tasks assessed/performed Overall Cognitive Status: History of cognitive impairments - at baseline Memory: Decreased short-term memory    Extremity/Trunk Assessment Upper Extremity Assessment Upper Extremity Assessment: Overall WFL for tasks assessed  Cervical / Trunk Assessment Cervical / Trunk Assessment: Kyphotic     Mobility Bed Mobility Bed Mobility: Supine to Sit Supine to Sit: 6: Modified independent (Device/Increase time);With rails;HOB elevated Transfers Sit to Stand: 4: Min assist;With upper extremity assist;From bed;From chair/3-in-1 Stand to Sit: 4: Min assist;With upper extremity assist;To chair/3-in-1 Details for Transfer Assistance: Min assist to steady, VCs hand placement     Exercise     Balance Balance Balance Assessed: Yes Static Sitting Balance Static Sitting - Balance Support: Bilateral upper extremity supported;Feet supported Static Sitting - Level of Assistance: 7: Independent Static Sitting - Comment/# of Minutes: 2 Dynamic Standing Balance Dynamic Standing - Level of Assistance: 4: Min assist   End of Session OT - End of Session Equipment Utilized During Treatment: Gait belt;Rolling walker Activity Tolerance: Patient limited by pain Patient left: in chair;with call bell/phone within reach;with chair alarm set;with family/visitor present  GO     Lennox Laity [phone] 06/22/2013, 10:35 AM

## 2013-06-22 NOTE — Progress Notes (Signed)
Late entry: Notified by Margaret [name] [facility] patient/family request services of [facility] at discharge; spoke with daughter, Margaret [name] (c: [phone]; [phone]) at bedside, patient awake alert able to participate in some of the conversation; initiated education on hospice services, philosophy and team approach to care with good understanding voiced by daughter; per daughter plan is for possible discharge tomorrow by personal vehicle-   Discussed DME needs daughter requests package D: fully electric hospital bed with AP&P mattress pad, half rails and overbed table, add bedside commode; Margaret [name] [facility] aware and informed [facility] representative to order equipment  Patient resides at the [facility] [address] IDL facility; per daughter she plans to supplement care with hired caregivers -currently patient also has services with [facility] 2x/week supervised by Coralie Common [phone]) per daughter she does not think this service is a PCS program and is aware that the patient can not access PCS and hospice services at the same time- daughter indicated she would want to access hospice services if there was a choice  - Discussed with daughter, that as this conversation ended after 5 pm, patient will be reviewed by [facility] physician and this writer would follow up with [facility] and family in am. Daughter was agreeable and appreciative of information.   Valente David, RN 06/22/2013, 7:03 PM Palliative Medicine Team RN Liaison [phone]

## 2013-06-22 NOTE — Discharge Summary (Signed)
Physician Discharge Summary  Margaret [name] ZOX:[id number] DOB: 1923/10/10 DOA: 06/21/2013  PCP: Michele Mcalpine, MD  Admit date: 06/21/2013 Discharge date: 06/23/2013  Recommendations for Outpatient Follow-up:  1. To home with home hospice.  Hospital bed to be delivered.    Discharge Diagnoses:  Principal Problem:   Aspiration pneumonia Active Problems:   VITAMIN B12 DEFICIENCY   HYPERCHOLESTEROLEMIA   HYPERTENSION   CEREBROVASCULAR DISEASE   GERD   UTI (lower urinary tract infection)   Hyponatremia   Chronic diastolic CHF (congestive heart failure)   Senile dementia, uncomplicated   Generalized weakness   Recurrent falls   Sepsis   Discharge Condition: stable, fair  Diet recommendation: dysphagia 1 with thin liquids  Wt Readings from Last 3 Encounters:  06/21/13 54.1 kg (119 lb 4.3 oz)  03/06/13 53.887 kg (118 lb 12.8 oz)  02/20/13 57.1 kg (125 lb 14.1 oz)    History of present illness:  The patient is a 79 y.o. year-old female with history of vascular/senile dementia with sundowning, chronic hyponatremia, dysphagia with recurrent aspiration pneumonia, HTN, HLD, chronic diastolic heart failure, B12 deficiency anemia, hypothyroidism who presents with multiple falls, dysuria, increased cough. The patient was last at their baseline health a couple weeks ago. For the last two weeks, she has had increased cough and progressive weakness. She was seen in the ER two days ago because of a fall backwards which resulted in a scalp hematoma. She was discharged back to independent living where she is cared for by independent aids and followed by palliative care. She has had progressive weakness. Last night, she had some nausea without vomiting and noticed some dysuria without urgency or frequency. This morning, she felt sick to her stomach and may have had one small emesis. She went to the bathroom and was too weak to get off the toilet. She felt presyncopal and was transported by EMS to the  ER. Per family, she has had increased slurred speech and generalized weakness without increased facial droop or focal weakness of arm or leg over the last week.  In the ER, she had a fever of 101.96F, mild tachycardia, 126/47 and oxygen saturations 93-94% on RA. Her WBC was 12.1, hemoglobin 8.9mg /dl (baseline 9), sodium 811 (slightly lower than baseline of 125-128), chloride 87. UA with small LE and 7-10 WBC. CXR demonstrated mild lower lobe opacity suggestive of pneumonia and chronic peribronchial thickening suggestive of COPD. Her head CT demonstrated stable age-related atrophy and chronic microvascular ischemic disease with decreased right parietal scalp contusion. She is being admitted for aspiration vs. CAP pneumonia.    Hospital Course:   Sepsis due to aspiration pneumonia vs. CAP, the former more likely given her history of dysphagia. Last admission was in 02/2013 (> 80mo ago).  She was admitted and started on unasyn for aspiration pneumonia in conjunction with azithromycin for atypical CAP coverage.  Her flu PCR was negative as were urine legionella and S. pneumo antigen.  Blood cultures are no growth to date.  Due to recurrent aspiration pneumonia, progressive deconditioning, the family met with palliative care to discuss goals of care.  She was deemed a hospice candidate and the family preferred that the patient not receive any medications other than those for comfort.  Her antibiotics were discontinued and she was started on a more aggressive pain regimen.  Case management is arranging for home hospice with a hospital bed at her current facility and her family is paying for additional assistance.  The hospice care workers can  assist with transition to residential hospice if the family prefers at a later date.    Dysuria suggestive of UTI despite relatively benign UA.  Urine culture is pending, however, the patient and family decline further antibiotics at this time.    Generalized weakness with  increased falls, may be due to infection, deconditioning, however, she has hx of recurrent strokes also.  PT/OT recommending 3-in-1.  Will provide hospital bed.  ASA discontinued as not providing direct comfort.    Dysphagia likely secondary to previous CVA. Previous assessments demonstrated frank aspiration. Family aware and do not want feeding tube or thickened liquids because she doesn't drink them and becomes dehydrated.  Continue dysphagia 1 diet with thin liquids and Ensure supplements.  Chronic hyponatremia, slightly worse than baseline, likely related to dehydration.  Received gentle hydration.  No further blood draws as trying to minimize painful procedures.  Presyncope likely related to dehydration and infection. Abx and IVF given initially but stopped after discussion with palliative care.    GERD, stable. Continued PPI for comfort HTN, stable. D/c'd BB  CVA, chronic. Continue FD ASA  Chronic diastolic heart failure, appears hypovolemic.  Hypothyroid, stable.  Continue synthroid to avoid worsening fatigue and constipation Normocytic anemia, chronic but hgb drifting down over last few months.  Dementia with sundowning.  Started on prn haldol.   Constipation, stable.  Continue sorbitol daily with miralax and senna-docusate prn  Procedures:  CT head  Consultations:  Palliative care  Discharge Exam: Filed Vitals:   06/22/13 1407  BP: 126/51  Pulse: 80  Temp:   Resp: 18   Filed Vitals:   06/21/13 2056 06/22/13 0501 06/22/13 1105 06/22/13 1407  BP: 132/55 130/58 125/60 126/51  Pulse: 79 78 76 80  Temp: 98 F (36.7 C) 98.1 F (36.7 C)    TempSrc: Oral Oral    Resp: 18 18  18   Height:      Weight:      SpO2: 97% 98%  97%    General: Cachectic CF, NAD HEENT:  MMM, NCAT Cardiovascular: RRR, soft S1 and S2, with 3/6 systolic murmur at RSB and heard across precordium, 2+ pulses, warm extremities  Respiratory: Course rales bilateral bases, diminished slightly on the  right compared to left Abdomen: NABS. Soft, ND/NT MSK:  No LEE  Discharge Instructions       Future Appointments Provider Department Dept Phone   07/03/2013 10:00 AM Michele Mcalpine, MD [facility] [phone]       Medication List    ASK your doctor about these medications       acetaminophen 325 MG tablet  Commonly known as:  TYLENOL  Take 2 tablets (650 mg total) by mouth every 6 (six) hours as needed.     cyanocobalamin 1000 MCG tablet  Take 1,000 mcg by mouth every morning.     feeding supplement (ENSURE COMPLETE) Liqd  Take 237 mLs by mouth daily.     lansoprazole 30 MG capsule  Commonly known as:  PREVACID  Take 30 mg by mouth every morning.     levothyroxine 75 MCG tablet  Commonly known as:  SYNTHROID, LEVOTHROID  Take 75 mcg by mouth every morning.     methylcellulose 1 % ophthalmic solution  Commonly known as:  ARTIFICIAL TEARS  Place 1 drop into both eyes as needed (dry eyes).     pindolol 5 MG tablet  Commonly known as:  VISKEN  Take 1 tablet (5 mg total) by mouth 2 (two)  times daily.     senna-docusate 8.6-50 MG per tablet  Commonly known as:  Senokot-S  Take 1 tablet by mouth at bedtime as needed for mild constipation. Take 1-2 tablet by mouth at bedtime as needed          The results of significant diagnostics from this hospitalization (including imaging, microbiology, ancillary and laboratory) are listed below for reference.    Significant Diagnostic Studies: Dg Chest 2 View  06/21/2013   CLINICAL DATA:  Weakness and syncope.  EXAM: CHEST  2 VIEW  COMPARISON:  Chest radiograph performed 03/06/2013  FINDINGS: The lungs are hyperexpanded, with flattening of the hemidiaphragms, compatible with COPD. Mild lower lobe opacity is suggested on the lateral view, raising question for mild pneumonia. Chronic peribronchial thickening is noted. No definite pleural effusion or pneumothorax is seen.  The heart is mildly enlarged. No acute osseous  abnormalities are seen.  IMPRESSION: 1. Mild lower lobe opacity suggested on the lateral view, raising question for mild pneumonia. 2. Findings of COPD; mild cardiomegaly noted.   Electronically Signed   By: Roanna Raider M.D.   On: 06/21/2013 05:04   Ct Head Wo Contrast  06/21/2013   CLINICAL DATA:  Increasing weakness, recent fall  EXAM: CT HEAD WITHOUT CONTRAST  TECHNIQUE: Contiguous axial images were obtained from the base of the skull through the vertex without intravenous contrast.  COMPARISON:  Prior CT from 06/17/2013  FINDINGS: Previously seen high right parietal scalp contusion is again noted, decreased in size relative to prior examination.  Diffuse age-related atrophy and chronic microvascular ischemic disease is not significantly changed relative to the prior examination. No large vessel territory infarct. There is no acute intracranial hemorrhage. No midline shift or mass lesion. No extra-axial fluid collection.  Calvarium is intact.  Orbits are normal. No mastoid effusion. Minimal circumferential opacities seen within the partially visualized maxillary sinuses bilaterally.  IMPRESSION: 1. Stable appearance of age-related atrophy and chronic microvascular ischemic disease. No acute intracranial process identified. 2. Interval decrease in size of right parietal scalp contusion.   Electronically Signed   By: Rise Mu M.D.   On: 06/21/2013 05:42   Ct Head Wo Contrast  06/17/2013   CLINICAL DATA:  Larey Seat backwards and hit right occipital area today with generalized weakness  EXAM: CT HEAD WITHOUT CONTRAST  CT CERVICAL SPINE WITHOUT CONTRAST  TECHNIQUE: Multidetector CT imaging of the head and cervical spine was performed following the standard protocol without intravenous contrast. Multiplanar CT image reconstructions of the cervical spine were also generated.  COMPARISON:  08/15/2012  FINDINGS: CT HEAD FINDINGS  Right posterior scalp hematoma at the vertex. No intracranial hemorrhage or  extra-axial fluid. Diffuse atrophy and involutional white matter change. No evidence of mass or vascular territory infarct. There is a inflammatory change in the paranasal sinuses. There is no skull fracture.  CT CERVICAL SPINE FINDINGS  No prevertebral soft tissue swelling. Extensive bilateral carotid arterial calcification. C6 and C7 are fused surgically. Minimal retrolisthesis of C5 on C6 with significant C5-6 degenerative disc disease. No fractures.  IMPRESSION: Scalp hematoma with no acute intracranial abnormalities. No evidence of acute abnormality involving the cervical spine.   Electronically Signed   By: Esperanza Heir M.D.   On: 06/17/2013 14:12   Ct Cervical Spine Wo Contrast  06/17/2013   CLINICAL DATA:  Larey Seat backwards and hit right occipital area today with generalized weakness  EXAM: CT HEAD WITHOUT CONTRAST  CT CERVICAL SPINE WITHOUT CONTRAST  TECHNIQUE: Multidetector CT imaging  of the head and cervical spine was performed following the standard protocol without intravenous contrast. Multiplanar CT image reconstructions of the cervical spine were also generated.  COMPARISON:  08/15/2012  FINDINGS: CT HEAD FINDINGS  Right posterior scalp hematoma at the vertex. No intracranial hemorrhage or extra-axial fluid. Diffuse atrophy and involutional white matter change. No evidence of mass or vascular territory infarct. There is a inflammatory change in the paranasal sinuses. There is no skull fracture.  CT CERVICAL SPINE FINDINGS  No prevertebral soft tissue swelling. Extensive bilateral carotid arterial calcification. C6 and C7 are fused surgically. Minimal retrolisthesis of C5 on C6 with significant C5-6 degenerative disc disease. No fractures.  IMPRESSION: Scalp hematoma with no acute intracranial abnormalities. No evidence of acute abnormality involving the cervical spine.   Electronically Signed   By: Esperanza Heir M.D.   On: 06/17/2013 14:12    Microbiology: Recent Results (from the past 240  hour(s))  URINE CULTURE     Status: None   Collection Time    06/21/13  4:27 AM      Result Value Range Status   Specimen Description URINE, RANDOM   Final   Special Requests Normal   Final   Culture  Setup Time     Final   Value: 06/21/2013 19:13     Performed at [facility] Count PENDING   Incomplete   Culture     Final   Value: Culture reincubated for better growth     Performed at [facility]   Report Status PENDING   Incomplete  MRSA PCR SCREENING     Status: Abnormal   Collection Time    06/21/13  8:57 AM      Result Value Range Status   MRSA by PCR POSITIVE (*) NEGATIVE Final   Comment:            The GeneXpert MRSA Assay (FDA     approved for NASAL specimens     only), is one component of a     comprehensive MRSA colonization     surveillance program. It is not     intended to diagnose MRSA     infection nor to guide or     monitor treatment for     MRSA infections.     RESULT CALLED TO, READ BACK BY AND VERIFIED WITH:     EMILY HARLESS,RN [id number] @ 1356 BY J SCOTTON     Labs: Basic Metabolic Panel:  Recent Labs Lab 06/21/13 0459  NA 123*  K 4.0  CL 87*  GLUCOSE 114*  BUN 16  CREATININE 0.90   Liver Function Tests: No results found for this basename: AST, ALT, ALKPHOS, BILITOT, PROT, ALBUMIN,  in the last 168 hours No results found for this basename: LIPASE, AMYLASE,  in the last 168 hours No results found for this basename: AMMONIA,  in the last 168 hours CBC:  Recent Labs Lab 06/21/13 0450 06/21/13 0459  WBC 12.1*  --   NEUTROABS 10.2*  --   HGB 8.9* 8.5*  HCT 24.5* 25.0*  MCV 86.9  --   PLT 242  --    Cardiac Enzymes: No results found for this basename: CKTOTAL, CKMB, CKMBINDEX, TROPONINI,  in the last 168 hours BNP: BNP (last 3 results)  Recent Labs  08/14/12 1134  PROBNP 1779.0*   CBG: No results found for this basename: GLUCAP,  in the last 168 hours  Time coordinating discharge: 45  minutes  Signed:  Renae Fickle  [facility] 06/22/2013, 4:37 PM

## 2013-06-22 NOTE — Care Management Note (Addendum)
    Page 1 of 2   06/23/2013     11:28:20 AM   CARE MANAGEMENT NOTE 06/23/2013  Patient:  Margaret [name], Margaret [name]   Account Number:  [id number]  Date Initiated:  06/22/2013  Documentation initiated by:  Lanier Clam  Subjective/Objective Assessment:   79 Y/O F ADMITTED W/FALL,WEAKNESS,DYSURIA,INCREASED COUGH.WU:[id number] DEMENTIA.     Action/Plan:   FROM INDEP LIV-[facility]   Anticipated DC Date:  06/23/2013   Anticipated DC Plan:  HOME W HOSPICE CARE      DC Planning Services  CM consult      Choice offered to / List presented to:  C-4 Adult Children   DME arranged  3-N-1  AIR OVERLAY MATTRESS  HOSPITAL BED  OVERBED TABLE  GEL OVERLAY      DME agency  [facility]     HH arranged  HH-1 RN      [facility] agency  [facility] [place]   Status of service:  Completed, signed off Medicare Important Message given?   (If response is "NO", the following Medicare IM given date fields will be blank) Date Medicare IM given:   Date Additional Medicare IM given:    Discharge Disposition:  HOME W HOSPICE CARE  Per UR Regulation:  Reviewed for med. necessity/level of care/duration of stay  If discussed at Long Length of Stay Meetings, dates discussed:    Comments:  06/23/13 Sherran Margolis RN,BSN NCM [phone] D/C HOME W/[facility] FOLLOWING.[facility] AWARE OF D/C.DME PER [facility] [place] FOLLOWING,& COMMUNICATING W/DTR.PER DTR THEY CAN D/C & DME CAN BE DELIVERED LATER.  06/22/13 Lela Gell RN,BSN NCM [phone] [facility] CHOSEN BY DTR GLORIA JOHNSON C#[phone],LEFT VM W/[facility] W/REFERRAL.TC [facility] DME REP LECRETIA INFORMED OF REFERRAL FOR DME,PROVIDED HER W/DTR'S C#.DTR WOULD LIKE FOR D/C AS EARLY AS POSSIBLE IN AM.FAMILY WILL TAKE PATIENT BACK TO [facility]. WILL PROVIDE HOME HOSPICE PROVIDER LIST.NOTED NEEDS HOSPITAL BED,3N1-RECOMMENDED.

## 2013-06-22 NOTE — Evaluation (Signed)
Physical Therapy Evaluation Patient Details Name: Margaret [name] MRN: [id number] DOB: 06-11-1924 Today's Date: 06/22/2013 Time: 5621-3086 PT Time Calculation (min): 36 min  PT Assessment / Plan / Recommendation History of Present Illness  The patient is a 79 y.o. year-old female with history of vascular/senile dementia with sundowning, chronic hyponatremia, dysphagia with recurrent aspiration pneumonia, HTN, HLD, chronic diastolic heart failure, B12 deficiency anemia, hypothyroidism who presents with multiple falls, dysuria, increased cough.   Clinical Impression  **Pt admitted with fall, UTI, PNA*. Pt currently with functional limitations due to the deficits listed below (see PT Problem List).  Pt will benefit from skilled PT to increase their independence and safety with mobility to allow discharge to the venue listed below.   *    PT Assessment  Patient needs continued PT services    Follow Up Recommendations  Home health PT    Does the patient have the potential to tolerate intense rehabilitation      Barriers to Discharge        Equipment Recommendations  None recommended by PT    Recommendations for Other Services OT consult   Frequency Min 3X/week    Precautions / Restrictions Precautions Precautions: Fall Restrictions Weight Bearing Restrictions: No   Pertinent Vitals/Pain *pt reported L ankle pain while walking No bruising/edema noted, ROM WNL RN notified**      Mobility  Bed Mobility Bed Mobility: Supine to Sit Supine to Sit: 6: Modified independent (Device/Increase time);With rails;HOB elevated Transfers Transfers: Sit to Stand;Stand to Sit;Stand Pivot Transfers Sit to Stand: 4: Min assist;With upper extremity assist;From bed;From chair/3-in-1 Stand to Sit: 4: Min assist;With upper extremity assist;To chair/3-in-1 Stand Pivot Transfers: 4: Min assist;With armrests Details for Transfer Assistance: Min assist to steady, VCs hand  placement Ambulation/Gait Ambulation/Gait Assistance: 4: Min guard Ambulation Distance (Feet): 140 Feet Assistive device: Rolling walker Ambulation/Gait Assistance Details: VCs for flexed neck posture, pt able to partially correct Gait Pattern: Within Functional Limits General Gait Details: steady, no LOB    Exercises     PT Diagnosis: Generalized weakness;Acute pain  PT Problem List: Decreased activity tolerance;Decreased mobility;Pain PT Treatment Interventions: Gait training;Functional mobility training;Therapeutic exercise;Patient/family education;Therapeutic activities     PT Goals(Current goals can be found in the care plan section) Acute Rehab PT Goals Patient Stated Goal: to return to independent living facility with assistance PT Goal Formulation: With patient/family Time For Goal Achievement: 07/06/13 Potential to Achieve Goals: Good  Visit Information  Last PT Received On: 06/22/13 Assistance Needed: +1 PT/OT Co-Evaluation/Treatment: Yes History of Present Illness: The patient is a 79 y.o. year-old female with history of vascular/senile dementia with sundowning, chronic hyponatremia, dysphagia with recurrent aspiration pneumonia, HTN, HLD, chronic diastolic heart failure, B12 deficiency anemia, hypothyroidism who presents with multiple falls, dysuria, increased cough.        Prior Functioning  Home Living Family/patient expects to be discharged to:: Other (Comment) (independent liveing at [facility]) Additional Comments: family assists pt 2-3 days/week, also has aide as needed Prior Function Level of Independence: Needs assistance Gait / Transfers Assistance Needed: used RW - pt stated she walked with RW independently  ADL's / Homemaking Assistance Needed: hired help comes 2-3x/wk to assist with shower Communication Communication: HOH Dominant Hand: Right    Cognition  Cognition Arousal/Alertness: Awake/alert Behavior During Therapy: WFL for tasks  assessed/performed Overall Cognitive Status: History of cognitive impairments - at baseline (able to follow directions, decreased short term memory which is baseline per family) Memory: Decreased short-term memory  Extremity/Trunk Assessment Lower Extremity Assessment Lower Extremity Assessment: Overall WFL for tasks assessed Cervical / Trunk Assessment Cervical / Trunk Assessment: Kyphotic   Balance Balance Balance Assessed: Yes Static Sitting Balance Static Sitting - Balance Support: Bilateral upper extremity supported;Feet supported Static Sitting - Level of Assistance: 7: Independent Static Sitting - Comment/# of Minutes: 2  End of Session PT - End of Session Equipment Utilized During Treatment: Gait belt Activity Tolerance: Patient tolerated treatment well Patient left: in chair;with call bell/phone within reach;with family/visitor present;with chair alarm set Nurse Communication: Mobility status  GP     Ralene Bathe Kistler 06/22/2013, 10:26 AM [phone]

## 2013-06-23 ENCOUNTER — Telehealth: Payer: Self-pay | Admitting: Pulmonary Disease

## 2013-06-23 LAB — URINE CULTURE
Culture: >=100000
Special Requests: NORMAL

## 2013-06-23 MED ORDER — DOCUSATE SODIUM 50 MG/5ML PO LIQD: 100.0000 mg | Freq: Every day | ORAL | Status: AC | PRN

## 2013-06-23 MED ORDER — HALOPERIDOL LACTATE 2 MG/ML PO CONC: 1.0000 mg | Freq: Four times a day (QID) | ORAL | Status: AC | PRN

## 2013-06-23 MED ORDER — ACETAMINOPHEN 160 MG/5ML PO SOLN: 650.0000 mg | Freq: Four times a day (QID) | ORAL | Status: DC

## 2013-06-23 MED ORDER — LANSOPRAZOLE 3 MG/ML PO SUSP: 30.0000 mg | Freq: Every day | ORAL | Status: AC

## 2013-06-23 NOTE — Progress Notes (Signed)
[facility] PROGRESS NOTE  Margaret [name] ZOX:[id number] DOB: 1924/05/07 DOA: 06/21/2013 PCP: Margaret Mcalpine, MD  Assessment/Plan  Sepsis due to aspiration pneumonia, stable.  No fevers or tachycardia.    Dysuria with > 100000 colonies of entercoccus.  Family declines antibiotics at this time.  Comfort measures only.  Generalized weakness.  Will provide equipment for comfort and safety and family providing additional aids to assist her.    Dysphagia, likely due to vascular dementia and worsening.  Continue dysphagia 1 diet with thin liquids for comfort.   GERD, stable. Continued PPI for comfort  HTN, stable. D/c'd BB  CVA, chronic. Continue FD ASA  Chronic diastolic heart failure, appears hypovolemic.  Hypothyroid, stable. Continue synthroid to avoid worsening fatigue and constipation  Normocytic anemia, chronic but hgb drifting down over last few months.  Dementia with sundowning. Started on prn haldol.  Constipation, stable. Continue sorbitol daily with miralax and senna-docusate prn  Code:  DNR and palliative care to follow.  Okay for discharge to ALF today.   Changed some medications to oral solution  Procedures:  CT head Consultations:  Palliative care  Antibiotics:  Unasyal 06/21/13   HPI/Subjective:  States she feels well today.  She has chronic pain in the right lower extremity.    Objective: Filed Vitals:   06/22/13 1105 06/22/13 1407 06/22/13 2035 06/23/13 0618  BP: 125/60 126/51 143/60 157/64  Pulse: 76 80 84 73  Temp:   97.8 F (36.6 C) 97.3 F (36.3 C)  TempSrc:   Oral Oral  Resp:  18 18 19   Height:      Weight:    55.6 kg (122 lb 9.2 oz)  SpO2:  97% 95% 98%    Intake/Output Summary (Last 24 hours) at 06/23/13 1014 Last data filed at 06/23/13 0850  Gross per 24 hour  Intake    240 ml  Output   1025 ml  Net   -785 ml   Filed Weights   06/21/13 0659 06/23/13 0618  Weight: 54.1 kg (119 lb 4.3 oz) 55.6 kg (122 lb 9.2 oz)     Exam:  General: Cachectic CF, NAD  HEENT: MMM, NCAT  Cardiovascular: RRR, soft S1 and S2, with 3/6 systolic murmur at RSB and heard across precordium, 2+ pulses, warm extremities  Respiratory: Course rales bilateral bases, diminished slightly on the right compared to left  Abdomen: NABS. Soft, ND/NT  MSK: No LEE   Data Reviewed: Basic Metabolic Panel:  Recent Labs Lab 06/21/13 0459  NA 123*  K 4.0  CL 87*  GLUCOSE 114*  BUN 16  CREATININE 0.90   Liver Function Tests: No results found for this basename: AST, ALT, ALKPHOS, BILITOT, PROT, ALBUMIN,  in the last 168 hours No results found for this basename: LIPASE, AMYLASE,  in the last 168 hours No results found for this basename: AMMONIA,  in the last 168 hours CBC:  Recent Labs Lab 06/21/13 0450 06/21/13 0459  WBC 12.1*  --   NEUTROABS 10.2*  --   HGB 8.9* 8.5*  HCT 24.5* 25.0*  MCV 86.9  --   PLT 242  --    Cardiac Enzymes: No results found for this basename: CKTOTAL, CKMB, CKMBINDEX, TROPONINI,  in the last 168 hours BNP (last 3 results)  Recent Labs  08/14/12 1134  PROBNP 1779.0*   CBG: No results found for this basename: GLUCAP,  in the last 168 hours  Recent Results (from the past 240 hour(s))  URINE CULTURE  Status: None   Collection Time    06/21/13  4:27 AM      Result Value Range Status   Specimen Description URINE, RANDOM   Final   Special Requests Normal   Final   Culture  Setup Time     Final   Value: 06/21/2013 19:13     Performed at [facility] Count PENDING   Incomplete   Culture     Final   Value: >=100,000 COLONIES/mL ENTEROCOCCUS SPECIES     Performed at [facility]   Report Status PENDING   Incomplete  MRSA PCR SCREENING     Status: Abnormal   Collection Time    06/21/13  8:57 AM      Result Value Range Status   MRSA by PCR POSITIVE (*) NEGATIVE Final   Comment:            The GeneXpert MRSA Assay (FDA     approved for NASAL specimens      only), is one component of a     comprehensive MRSA colonization     surveillance program. It is not     intended to diagnose MRSA     infection nor to guide or     monitor treatment for     MRSA infections.     RESULT CALLED TO, READ BACK BY AND VERIFIED WITH:     Margaret HARLESS,RN [id number] @ 1356 BY Margaret [name]     Studies: No results found.  Scheduled Meds: . acetaminophen (TYLENOL) oral liquid 160 mg/5 mL  650 mg Oral Q6H  . benzonatate  100 mg Oral TID  . Chlorhexidine Gluconate Cloth  6 each Topical Q0600  . feeding supplement (ENSURE COMPLETE)  237 mL Oral Daily  . feeding supplement (ENSURE COMPLETE)  237 mL Oral BID BM  . levothyroxine  75 mcg Oral q morning - 10a  . mupirocin ointment  1 application Nasal BID  . pantoprazole  40 mg Oral Daily  . sodium chloride  3 mL Intravenous Q12H  . sorbitol  30 mL Oral QHS   Continuous Infusions:   Principal Problem:   Aspiration pneumonia Active Problems:   VITAMIN B12 DEFICIENCY   HYPERCHOLESTEROLEMIA   HYPERTENSION   CEREBROVASCULAR DISEASE   GERD   UTI (lower urinary tract infection)   Hyponatremia   Chronic diastolic CHF (congestive heart failure)   Senile dementia, uncomplicated   Generalized weakness   Recurrent falls   Sepsis    Time spent: 30 min    Margaret [name]  [facility] Pager [phone]. If 7PM-7AM, please contact night-coverage at [email or web address], password [facility] 06/23/2013, 10:14 AM  LOS: 2 days

## 2013-06-23 NOTE — Telephone Encounter (Signed)
Per SN---this will be fine.  thanks

## 2013-06-23 NOTE — Discharge Summary (Signed)
Physician Discharge Summary  Margaret [name] ZOX:[id number] DOB: [birth year] DOA: 06/21/2013  PCP: Michele Mcalpine, MD  Admit date: 06/21/2013 Discharge date: 06/23/2013  Recommendations for Outpatient Follow-up:  1. To home with home hospice.  Hospital bed to be delivered.    Discharge Diagnoses:  Principal Problem:   Aspiration pneumonia Active Problems:   VITAMIN B12 DEFICIENCY   HYPERCHOLESTEROLEMIA   HYPERTENSION   CEREBROVASCULAR DISEASE   GERD   UTI (lower urinary tract infection)   Hyponatremia   Chronic diastolic CHF (congestive heart failure)   Senile dementia, uncomplicated   Generalized weakness   Recurrent falls   Sepsis   Discharge Condition: stable, fair  Diet recommendation: dysphagia 1 with thin liquids  Wt Readings from Last 3 Encounters:  06/23/13 55.6 kg (122 lb 9.2 oz)  03/06/13 53.887 kg (118 lb 12.8 oz)  02/20/13 57.1 kg (125 lb 14.1 oz)    History of present illness:  The patient is a 79 y.o. year-old female with history of vascular/senile dementia with sundowning, chronic hyponatremia, dysphagia with recurrent aspiration pneumonia, HTN, HLD, chronic diastolic heart failure, B12 deficiency anemia, hypothyroidism who presents with multiple falls, dysuria, increased cough. The patient was last at their baseline health a couple weeks ago. For the last two weeks, she has had increased cough and progressive weakness. She was seen in the ER two days ago because of a fall backwards which resulted in a scalp hematoma. She was discharged back to independent living where she is cared for by independent aids and followed by palliative care. She has had progressive weakness. Last night, she had some nausea without vomiting and noticed some dysuria without urgency or frequency. This morning, she felt sick to her stomach and may have had one small emesis. She went to the bathroom and was too weak to get off the toilet. She felt presyncopal and was transported by EMS to the  ER. Per family, she has had increased slurred speech and generalized weakness without increased facial droop or focal weakness of arm or leg over the last week.  In the ER, she had a fever of 101.28F, mild tachycardia, 126/47 and oxygen saturations 93-94% on RA. Her WBC was 12.1, hemoglobin 8.9mg /dl (baseline 9), sodium 811 (slightly lower than baseline of 125-128), chloride 87. UA with small LE and 7-10 WBC. CXR demonstrated mild lower lobe opacity suggestive of pneumonia and chronic peribronchial thickening suggestive of COPD. Her head CT demonstrated stable age-related atrophy and chronic microvascular ischemic disease with decreased right parietal scalp contusion. She is being admitted for aspiration vs. CAP pneumonia.    Hospital Course:   Sepsis due to aspiration pneumonia vs. CAP, the former more likely given her history of dysphagia. Last admission was in 02/2013 (> 8mo ago).  She was admitted and started on unasyn for aspiration pneumonia in conjunction with azithromycin for atypical CAP coverage.  Her flu PCR was negative as were urine legionella and S. pneumo antigen.  Blood cultures are no growth to date.  Due to recurrent aspiration pneumonia, progressive deconditioning, the family met with palliative care to discuss goals of care.  She was deemed a hospice candidate and the family preferred that the patient not receive any medications other than those for comfort.  Her antibiotics were discontinued and she was started on a more aggressive pain regimen.  Case management is arranging for home hospice with a hospital bed at her current facility and her family is paying for additional assistance.  The hospice care workers can  assist with transition to residential hospice if the family prefers at a later date.    Dysuria suggestive of UTI despite relatively benign UA.  Urine culture is pending, however, the patient and family decline further antibiotics at this time.    Generalized weakness with  increased falls, may be due to infection, deconditioning, however, she has hx of recurrent strokes also.  PT/OT recommending 3-in-1.  Will provide hospital bed.  ASA discontinued as not providing direct comfort.    Dysphagia likely secondary to previous CVA. Previous assessments demonstrated frank aspiration. Family aware and do not want feeding tube or thickened liquids because she doesn't drink them and becomes dehydrated.  Continue dysphagia 1 diet with thin liquids and Ensure supplements.  Chronic hyponatremia, slightly worse than baseline, likely related to dehydration.  Received gentle hydration.  No further blood draws as trying to minimize painful procedures.  Presyncope likely related to dehydration and infection. Abx and IVF given initially but stopped after discussion with palliative care.    GERD, stable. Continued PPI for comfort HTN, stable. D/c'd BB  CVA, chronic. Continue FD ASA  Chronic diastolic heart failure, appears hypovolemic.  Hypothyroid, stable.  Continue synthroid to avoid worsening fatigue and constipation Normocytic anemia, chronic but hgb drifting down over last few months.  Dementia with sundowning.  Started on prn haldol.   Constipation, stable.  Continue sorbitol daily with miralax and senna-docusate prn  Procedures:  CT head  Consultations:  Palliative care  Discharge Exam: Filed Vitals:   06/23/13 0618  BP: 157/64  Pulse: 73  Temp: 97.3 F (36.3 C)  Resp: 19   Filed Vitals:   06/22/13 1105 06/22/13 1407 06/22/13 2035 06/23/13 0618  BP: 125/60 126/51 143/60 157/64  Pulse: 76 80 84 73  Temp:   97.8 F (36.6 C) 97.3 F (36.3 C)  TempSrc:   Oral Oral  Resp:  18 18 19   Height:      Weight:    55.6 kg (122 lb 9.2 oz)  SpO2:  97% 95% 98%    General: Cachectic CF, NAD, sitting up and smiling HEENT:  MMM, NCAT Cardiovascular: RRR, soft S1 and S2, with 3/6 systolic murmur at RSB and heard across precordium, 2+ pulses, warm extremities   Respiratory: Course rales bilateral bases, diminished slightly on the right compared to left, stable from prior Abdomen: NABS. Soft, ND/NT MSK:  No LEE  Discharge Instructions      Discharge Orders   Future Appointments Provider Department Dept Phone   07/03/2013 10:00 AM Michele Mcalpine, MD [facility] [phone]   Future Orders Complete By Expires   Call MD for:  difficulty breathing, headache or visual disturbances  As directed    Call MD for:  extreme fatigue  As directed    Call MD for:  hives  As directed    Call MD for:  persistant dizziness or light-headedness  As directed    Call MD for:  persistant nausea and vomiting  As directed    Call MD for:  severe uncontrolled pain  As directed    Call MD for:  temperature >100.4  As directed    Diet general  As directed    Comments:     Dysphagia 1 with thin liquids   Discharge instructions  As directed    Comments:     Ms. Kievit was hospitalized with sepsis and was found to have probable aspiration pneumonia and possible urinary tract infection.  Most likely, she has worsening  aspiration and therefore is developing recurrent and worsening pneumonias with deconditioning.  In discussion with palliative care, are goals will be to make Ms. Partch as comfortable as possible and try to minimize painful or uncomfortable procedures.  We have also tried to minimize medications that are not directly related to comfort or preventing discomfort.   Increase activity slowly  As directed        Medication List    STOP taking these medications       acetaminophen 325 MG tablet  Commonly known as:  TYLENOL  Replaced by:  acetaminophen 160 MG/5ML solution     cyanocobalamin 1000 MCG tablet     lansoprazole 30 MG capsule  Commonly known as:  PREVACID  Replaced by:  Lansoprazole 3 MG/ML Susp     pindolol 5 MG tablet  Commonly known as:  VISKEN     senna-docusate 8.6-50 MG per tablet  Commonly known as:  Senokot-S      TAKE  these medications       acetaminophen 160 MG/5ML solution  Commonly known as:  TYLENOL  Take 20.3 mLs (650 mg total) by mouth every 6 (six) hours.     benzonatate 100 MG capsule  Commonly known as:  TESSALON  Take 1 capsule (100 mg total) by mouth 3 (three) times daily.     docusate 50 MG/5ML liquid  Commonly known as:  COLACE  Take 10 mLs (100 mg total) by mouth daily as needed for mild constipation.     feeding supplement (ENSURE COMPLETE) Liqd  Take 237 mLs by mouth daily.     haloperidol 2 MG/ML solution  Commonly known as:  HALDOL  Take 0.5 mLs (1 mg total) by mouth every 6 (six) hours as needed for agitation.     Lansoprazole 3 MG/ML Susp  Take 30 mg by mouth daily.     levothyroxine 75 MCG tablet  Commonly known as:  SYNTHROID, LEVOTHROID  Take 75 mcg by mouth every morning.     methylcellulose 1 % ophthalmic solution  Commonly known as:  ARTIFICIAL TEARS  Place 1 drop into both eyes as needed (dry eyes).     morphine CONCENTRATE 10 mg / 0.5 ml concentrated solution  Take 0.25 mLs (5 mg total) by mouth every 2 (two) hours as needed for moderate pain, severe pain, anxiety or shortness of breath (Headache, dyspnea, distress, agitation).     polyethylene glycol packet  Commonly known as:  MIRALAX / GLYCOLAX  Take 17 g by mouth daily as needed for mild constipation.     sorbitol 70 % Soln  Take 30 mLs by mouth at bedtime.       Follow-up Information   Follow up with NADEL,SCOTT M, MD. Schedule an appointment as soon as possible for a visit in 2 weeks.   Specialty:  Pulmonary Disease   Contact information:   [address] [place] [address] [phone]       [facility] with [facility]. ([facility] to follolw aftr d/c -pls notify whn pt ready to leave unit call [phone] O9r if aftr 5 pm [phone]))    Contact information:   [facility] [address] [place] [phone]       The results of significant diagnostics from  this hospitalization (including imaging, microbiology, ancillary and laboratory) are listed below for reference.    Significant Diagnostic Studies: Dg Chest 2 View  06/21/2013   CLINICAL DATA:  Weakness and syncope.  EXAM: CHEST  2 VIEW  COMPARISON:  Chest radiograph performed 03/06/2013  FINDINGS: The lungs are hyperexpanded, with flattening of the hemidiaphragms, compatible with COPD. Mild lower lobe opacity is suggested on the lateral view, raising question for mild pneumonia. Chronic peribronchial thickening is noted. No definite pleural effusion or pneumothorax is seen.  The heart is mildly enlarged. No acute osseous abnormalities are seen.  IMPRESSION: 1. Mild lower lobe opacity suggested on the lateral view, raising question for mild pneumonia. 2. Findings of COPD; mild cardiomegaly noted.   Electronically Signed   By: Roanna Raider M.D.   On: 06/21/2013 05:04   Ct Head Wo Contrast  06/21/2013   CLINICAL DATA:  Increasing weakness, recent fall  EXAM: CT HEAD WITHOUT CONTRAST  TECHNIQUE: Contiguous axial images were obtained from the base of the skull through the vertex without intravenous contrast.  COMPARISON:  Prior CT from 06/17/2013  FINDINGS: Previously seen high right parietal scalp contusion is again noted, decreased in size relative to prior examination.  Diffuse age-related atrophy and chronic microvascular ischemic disease is not significantly changed relative to the prior examination. No large vessel territory infarct. There is no acute intracranial hemorrhage. No midline shift or mass lesion. No extra-axial fluid collection.  Calvarium is intact.  Orbits are normal. No mastoid effusion. Minimal circumferential opacities seen within the partially visualized maxillary sinuses bilaterally.  IMPRESSION: 1. Stable appearance of age-related atrophy and chronic microvascular ischemic disease. No acute intracranial process identified. 2. Interval decrease in size of right parietal scalp contusion.    Electronically Signed   By: Rise Mu M.D.   On: 06/21/2013 05:42   Ct Head Wo Contrast  06/17/2013   CLINICAL DATA:  Larey Seat backwards and hit right occipital area today with generalized weakness  EXAM: CT HEAD WITHOUT CONTRAST  CT CERVICAL SPINE WITHOUT CONTRAST  TECHNIQUE: Multidetector CT imaging of the head and cervical spine was performed following the standard protocol without intravenous contrast. Multiplanar CT image reconstructions of the cervical spine were also generated.  COMPARISON:  08/15/2012  FINDINGS: CT HEAD FINDINGS  Right posterior scalp hematoma at the vertex. No intracranial hemorrhage or extra-axial fluid. Diffuse atrophy and involutional white matter change. No evidence of mass or vascular territory infarct. There is a inflammatory change in the paranasal sinuses. There is no skull fracture.  CT CERVICAL SPINE FINDINGS  No prevertebral soft tissue swelling. Extensive bilateral carotid arterial calcification. C6 and C7 are fused surgically. Minimal retrolisthesis of C5 on C6 with significant C5-6 degenerative disc disease. No fractures.  IMPRESSION: Scalp hematoma with no acute intracranial abnormalities. No evidence of acute abnormality involving the cervical spine.   Electronically Signed   By: Esperanza Heir M.D.   On: 06/17/2013 14:12   Ct Cervical Spine Wo Contrast  06/17/2013   CLINICAL DATA:  Larey Seat backwards and hit right occipital area today with generalized weakness  EXAM: CT HEAD WITHOUT CONTRAST  CT CERVICAL SPINE WITHOUT CONTRAST  TECHNIQUE: Multidetector CT imaging of the head and cervical spine was performed following the standard protocol without intravenous contrast. Multiplanar CT image reconstructions of the cervical spine were also generated.  COMPARISON:  08/15/2012  FINDINGS: CT HEAD FINDINGS  Right posterior scalp hematoma at the vertex. No intracranial hemorrhage or extra-axial fluid. Diffuse atrophy and involutional white matter change. No evidence of  mass or vascular territory infarct. There is a inflammatory change in the paranasal sinuses. There is no skull fracture.  CT CERVICAL SPINE FINDINGS  No prevertebral soft tissue swelling. Extensive bilateral  carotid arterial calcification. C6 and C7 are fused surgically. Minimal retrolisthesis of C5 on C6 with significant C5-6 degenerative disc disease. No fractures.  IMPRESSION: Scalp hematoma with no acute intracranial abnormalities. No evidence of acute abnormality involving the cervical spine.   Electronically Signed   By: Esperanza Heir M.D.   On: 06/17/2013 14:12    Microbiology: Recent Results (from the past 240 hour(s))  URINE CULTURE     Status: None   Collection Time    06/21/13  4:27 AM      Result Value Range Status   Specimen Description URINE, RANDOM   Final   Special Requests Normal   Final   Culture  Setup Time     Final   Value: 06/21/2013 19:13     Performed at [facility] Count PENDING   Incomplete   Culture     Final   Value: >=100,000 COLONIES/mL ENTEROCOCCUS SPECIES     Performed at [facility]   Report Status PENDING   Incomplete  MRSA PCR SCREENING     Status: Abnormal   Collection Time    06/21/13  8:57 AM      Result Value Range Status   MRSA by PCR POSITIVE (*) NEGATIVE Final   Comment:            The GeneXpert MRSA Assay (FDA     approved for NASAL specimens     only), is one component of a     comprehensive MRSA colonization     surveillance program. It is not     intended to diagnose MRSA     infection nor to guide or     monitor treatment for     MRSA infections.     RESULT CALLED TO, READ BACK BY AND VERIFIED WITH:     EMILY HARLESS,RN [id number] @ 1356 BY J SCOTTON     Labs: Basic Metabolic Panel:  Recent Labs Lab 06/21/13 0459  NA 123*  K 4.0  CL 87*  GLUCOSE 114*  BUN 16  CREATININE 0.90   Liver Function Tests: No results found for this basename: AST, ALT, ALKPHOS, BILITOT, PROT, ALBUMIN,  in the last 168  hours No results found for this basename: LIPASE, AMYLASE,  in the last 168 hours No results found for this basename: AMMONIA,  in the last 168 hours CBC:  Recent Labs Lab 06/21/13 0450 06/21/13 0459  WBC 12.1*  --   NEUTROABS 10.2*  --   HGB 8.9* 8.5*  HCT 24.5* 25.0*  MCV 86.9  --   PLT 242  --    Cardiac Enzymes: No results found for this basename: CKTOTAL, CKMB, CKMBINDEX, TROPONINI,  in the last 168 hours BNP: BNP (last 3 results)  Recent Labs  08/14/12 1134  PROBNP 1779.0*   CBG: No results found for this basename: GLUCAP,  in the last 168 hours  Time coordinating discharge: 45 minutes  Signed:  Kaien Pezzullo  [facility] 06/23/2013, 10:23 AM

## 2013-06-23 NOTE — Telephone Encounter (Signed)
Spoke with Julieanne Cotton at [facility] and advised that Dr Kriste Basque is ok to be attending for Hospice and let hospice docs provide symptom management.

## 2013-06-23 NOTE — Telephone Encounter (Signed)
Please advise SN thanks 

## 2013-06-24 ENCOUNTER — Other Ambulatory Visit: Payer: Self-pay | Admitting: Pulmonary Disease

## 2013-06-24 MED ORDER — LEVOFLOXACIN 500 MG PO TABS: 500.0000 mg | ORAL_TABLET | Freq: Every day | ORAL | Status: DC

## 2013-07-03 ENCOUNTER — Encounter: Payer: Self-pay | Admitting: Adult Health

## 2013-07-03 ENCOUNTER — Ambulatory Visit: Payer: Medicare Other | Admitting: Pulmonary Disease

## 2013-07-03 ENCOUNTER — Ambulatory Visit (INDEPENDENT_AMBULATORY_CARE_PROVIDER_SITE_OTHER): Payer: Medicare Other | Admitting: Adult Health

## 2013-07-03 VITALS — BP 142/88 | HR 81 | Temp 96.8°F | Ht 64.0 in | Wt 121.0 lb

## 2013-07-03 DIAGNOSIS — J690 Pneumonitis due to inhalation of food and vomit: Secondary | ICD-10-CM

## 2013-07-03 NOTE — Patient Instructions (Addendum)
On current regimen with hospice .  Follow up Dr. Kriste Basque as planned and As needed      Late add :  Check cxr on return

## 2013-07-03 NOTE — Progress Notes (Signed)
Subjective:    Patient ID: Margaret [name], female    DOB: 1924-08-06, 79 y.o.   MRN: [id number]  HPI 79 y/o WF  . She has Chr Sinusitis;  HBP;  ASPVD;  Chol;  GERD/ Constip w/ hx impaction;  UTIs;  DJD/ DDD;  Hx anxiety;  Hx anemia...   03/06/13 Post Hospital follow up and labs  Returns for 2 weeks post hospital follow up  Admitted 7/7-7/11 for acute mental status changes. She has a hx of chronic hyponatremia . Na was 112 on admission. She had a recent UTI. , CXR with new R lung air space disease. Tx with abx. Na at discharge was 127.  Since discharge she is feeling some better but still weak and tired. No chest pain, orthopnea, n/v/d.   07/03/2013 Follow up  Pt was recently admitted for chronic hyponatremia, dysphagia with recurrent aspiration pneumonia.  CXR demonstrated mild lower lobe opacity suggestive of pneumonia and chronic peribronchial thickening suggestive of COPD.  Tx w/ aggressive abx and pulmonary hygiene. Family meeting with palliative care consult. PT was discharged on hospice care . Her dysphagia likely secondary to previous CVA. Previous assessments demonstrated frank aspiration. Family aware and do not want feeding tube or thickened liquids  Since discharge says she is doing okay but still weak.  She denies any hemoptysis, orthopnea, PND, or leg swelling.           Problem List:      SINUSITIS, CHRONIC (ICD-473.9) - Long Hx chronic sinus problems - Eval & Rx by DrShoemaker w/ hx epistaxis, nasal septal erosion w/ prev "button";  and nasal polyps... she uses Saline, Mucinex, etc... she notes mild chr cough from the drainage- we reviewed chronic nasal regimen & rec MUCINEX DM 2Bid...  HYPERTENSION (ICD-401.9) >> currently controlled on LOSARTAN 50mg /d & PINDOLOL 5mg Bid... ~  Baseline EKG w/ NSR, WNL... ~  2D ECHO 2001 was normal... ~  CXR 11/07 w/ Biapical pl thickening, NAD... CXR 9/09 showed COPD w/ incr markings, NAD... f/u CXR 5/11 showed COPD, NAD...  ~  11/10: c/o  weak in AM- decision to decr Amlodipine to 5mg /d & BP has been stable on this. ~  8/11: Hosp by [facility] w/ "presyncope" & meds were adjusted but she restarted prev Rx on discharge> we decided to stop Diovan/Hct in favor of LOSARTAN 50mg /d (?if she ever even filled it- reminded to bring all med bottles to every visit). ~  7/12: BP today= 120/70, no postural changes, ?on Amlodip5 & Losartan50... she denies HA, visual symptoms, CP, palpit, change in SOB, edema, etc... ~  10/12: Near syncope at [facility]> saw DrRoss w/ attempted meds changes (asked to stop Amlodip/Losartan & start Pindolol5Bid) but she decided to take all 3... ~  11/12: BP today= 132/70 w/ transient postural drop; asked to stop Amlodip, continue LOSARTAN50, take VISKEN5Bid... ~  1/13: BP= 118/72 on same meds & feeling better since disch... ~  2/13: BP= 142/64 on Losar50 & Pindolol5Bid, continue same; 1 view CXR 2/13 showed COPD, biapical scarring, NAD... ~  4/13: BP= 128/72 w/o postural changes; she denies CP, palpit, ch in DOE, edema... ~  7/13:  BP= 118/72 & she remains asymptomatic... ~  12/13:  CXR showed norm heart size, clear lungs, NAD;  EKG 12/13 showed NSR, rate87, 1st degree AVB, NAD... ~  12/13:  2DEcho showed norm LVF w/ EF=65-70%, Gr1DD, mildly calcif MV leaflets w/o stenosis or regurg... ~  1/14: on Lisinopril2.5, Pindolol5Bid; BP= 180/84 & she denies CP, palpit,  ch in SOB, edema; we decided to switch the Lisin to LOSARTAN 50mg /d.. ~  4/14:   CEREBROVASCULAR DISEASE (ICD-437.9) - on ASA 325mg /d... Prev eval for syncope (related to postural BP changes) w/ MRI showing small vessel dz;  she has bilat CBruits w/ Carotid Dopplers in 2001 showing mild plaque, no signif ICA stenoses... ~  she denies cerebral ischemic symptoms on the ASA 81mg /d...  ~  Adm 12/13-1/14 & MRI brain showed sm acute infarct in right occipital lobe, advanced microvasc ischemia; seen by Neuro & they incr her ASA to 325mg /d. ~  12/13: CDopplers showed mild to mod  heterogeneous plaque on right w/ 0-39% ICA stenosis; only mild plaque on left & no stenosis, vertebrals were antegrade... ~  1/14: on ASA325; she denies recurrent cerebral ischemic symptoms...  VENOUS INSUFFICIENCY (ICD-459.81) - on low sodium diet + support hose as needed.  HYPERCHOLESTEROLEMIA (ICD-272.0) - prev on Simva40 but she thinks this made her weak & she stopped it;  Tried Crestor 20mg /d but ?if filling this med regularly or not, asked to bring all bottles to each visit... ~  FLP 12/08 showed TChol 204, TG 93, HDL 67, LDL 106... On Simva40/d. ~  FLP 9/09 showed TChol 183, TG 53, HDL 68, LDL 105... Contin same + diet. ~  FLP 8/10 showed TChol 224, TG 78, HDL 67, LDL 137... rec> take med regularly & work on diet. ~  FLP 5/11 showed TChol 187, TG 80, HDL 67, LDL 104 ~  8/11: she thinks the Simva40 makes her weak & she wants to put it on HOLD. ~  FLP off meds 3/12 showed TChol 293, TG 90, HDL 81, LDL 191... rec to restart Cres20. ~  FLP 7/12 ?on Cres20 showed TChol 163, TG 91, HDL 72, LDL 73 ~  She has since stopped the Crestor on her own (?making her weak). ~  1/13 & 4/13: Pt on diet alone & doesn't want Chol meds; not fasting today for FLP... ~  FLP 12/13 in hosp showed TChol 200, TG 76, HDL 78, LDL 107=> Neuro started the Simva20=> she stopped on her own.  UNSPECIFIED DISORDER OF THYROID/ HYPOTHYROIDISM - she has a sl elevated TSH which we were following & finally started SYNTHROID 26mcg/d 3/12... ~  labs 9/09 showed TSH= 7.31 ~  labs 1/10 showed TSH= 5.40 ~  labs 8/10 showed TSH= 5.08... she does c/o being "cold" ~  labs 5/11 showed TSH= 6.11... she notes that her energy is "pretty fair" ~  labs 8/11 showed TSH= 0.73... wt= 116# & stable over the last 54mo. ~  Labs 3/12 showed TSH= 9.85... Rec> start SYNTHROID 53mcg/d. ~  Labs 7/12 on Levo75 showed TSH= 0.27... Keep same for now. ~  Labs 1/13 on Levo75 showed TSH= 4.68 ~  Labs 12/13 on Levo75 showed TSH= 1.54  GERD (ICD-530.81) -  PPI changed to generic PREVACID 30mg /d... Known HH & GERD w/ EGD from [facility] 8/01 & 2/06 showing HH, GERD, erosions, & gastric polyp...  ~  EGD 12/08 w/ savory dilatation... she is encouraged to f/u w/ DrMagod if symptoms occur... ~  f/u EGD w/ dilatation 11/11 by [facility] for dysphagia symptoms showed tort esoph, sm HH, tiny gastric polyps, Savory dil done. ~  Yet another EGD & dilatation 1/13 by DrMagod; smHH, few tiny gastric polyps, Savory dilatation done... ~  Adm 3/14 w/ epis of asp pneumonia; seen by speech path w/ severe dysphagia- she refuses PEG etc...  CONSTIPATION (ICD-564.00) - Hx severe constipation w/  fecal impaction req hosp 2/03 & intermittently over the yrs... ~  colonoscopy 2/06 DrMagod w/ hemorrhoids & sm polyps... Encouraged to use MIRALAX daily... ~  [facility] 10/11 w/ constip & fecal impaction> disimpacted & started back on MIRALAX & SENAKOT-S... ~  CT Abd 10/11 showed norm GB, sm bilat renal cysts, prev hyst, rectal wall thickening c/w proctitis. ~  [facility] 1/13 w/ similar problem & we reviewed the importance of taking the Miralax & Senakot every day & monitoring her output w/ contingency meds (Mag citrate & Dulcolax) if needed... ~  2/13 OV: she reports improved w/ regular Miralax & prn Dulcolax; asked to restart the SENAKOT-S Qhs as well...  UTI (ICD-599.0) - Hx chronic UTI's Rx by Urology Joneen Boers w/ Macrodantin in the past... ~  8/11: Hosp by [facility] w/ UTI- sens EColi treated w/ Rocephin/ then Septra. ~  2/13: UTI w/ final C&S growing Staph & Enterococcus both sens to Levaquin & Tcn> Rx Levaquin 500mg /d + Align. ~  She has had numerous UTIs in the interval- Hosp 1/14 w/ pan-sens [facility] & [facility] 3/14 w/ sens Staph...  DEGENERATIVE JOINT DISEASE (ICD-715.90) - she uses Tylenol/ Advil as needed... ~  5/09:  she fell and broke her right wrist- s/p ORIF 6/09 by DrWeingold  DEGENERATIVE DISC DISEASE, CERVICAL SPINE (ICD-722.4) - CSpine surg by DrJenkins 1998... ~  12/13:  She is back  on Vit D 50,000 u weekly...  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 ~  1/14: she was disch by [facility] on Lisinopril 2.5mg  & we will watch her closely for allergic reaction...  ANEMIA (ICD-285.9) >> she takes one IRON tab daily & B12 1038mcg/d... VITAMIN B12 DEFICIENCY (ICD-266.2) ~  labs 6/08 showed Hg= 10.5 ~  labs 12/08 showed Hg= 12.3, Fe= 92 ~  labs 9/09 showed Hg= 11.8, MCV=89 ~  labs 1/10 showed Hg= 12.1, MCV= 89, Fe= 83, B12= 186... started on B12 shots monthly. ~  labs 8/10 showed Hg= 12.2, MCV= 92, Fe= 87, B12= 470... continue Rx. ~  labs 5/11 showed Hg= 11.2, MCV= 88 ~  labs 8/11 in hosp showed Hg= 10-11 range; ?switched to Oral B12- 1062mcg/d... ~  labs 10/11 in hosp showed Hg= 10-11 range... asked to start Femiron one tab daily. ~  Labs 3/12 showed Hg= 12.0, MCV= 90 ~  Labs 7/12 showed Hg= 11.1, MCV= 89, Fe= 87 (22%sat) ~  Labs 1/13 showed Hg=10.3, MCV=90, Fe=46 (13%sat), B12=635, SPE=neg (no monoclonal spike). ~  Labs 1/14 showed Hg= 10-11 range ~  Labs 3/14 Hosp showed Hg~8-9 range... ~  Labs 4/14 showed Hg= 9.8  Hx SHINGLES - right T9-10 distrib 6/10 & treated w/ Famvir, Medrol, DCN100...   Past Surgical History  Procedure Laterality Date  . Tonsillectomy    . Total abdominal hysterectomy w/ bilateral salpingoophorectomy    . Cervical fusion    . Cataract extraction    . Nasal sinus surgery    . Esophagogastroduodenoscopy  09/10/2011    Procedure: ESOPHAGOGASTRODUODENOSCOPY (EGD);  Surgeon: Petra Kuba, MD;  Location: [facility] ENDOSCOPY;  Service: Endoscopy;  Laterality: N/A;  c-arm needed  . Savory dilation  09/10/2011    Procedure: SAVORY DILATION;  Surgeon: Petra Kuba, MD;  Location: [facility] ENDOSCOPY;  Service: Endoscopy;  Laterality: N/A;  . Esophagogastroduodenoscopy  05/13/2012    Procedure: ESOPHAGOGASTRODUODENOSCOPY (EGD);  Surgeon: Petra Kuba, MD;  Location: [facility] ENDOSCOPY;   Service: Endoscopy;  Laterality: N/A;  with carm  . Savory dilation  05/13/2012    Procedure: SAVORY DILATION;  Surgeon: Petra Kuba, MD;  Location: [facility] ENDOSCOPY;  Service: Endoscopy;  Laterality: N/A;    Outpatient Encounter Prescriptions as of 07/03/2013  Medication Sig  . acetaminophen (TYLENOL) 160 MG/5ML solution Take 20.3 mLs (650 mg total) by mouth every 6 (six) hours.  . docusate (COLACE) 50 MG/5ML liquid Take 10 mLs (100 mg total) by mouth daily as needed for mild constipation.  . feeding supplement (ENSURE COMPLETE) LIQD Take 237 mLs by mouth daily.  . Lansoprazole 3 MG/ML SUSP Take 30 mg by mouth daily.  Marland Kitchen levofloxacin (LEVAQUIN) 500 MG tablet Take 1 tablet (500 mg total) by mouth daily.  Marland Kitchen levothyroxine (SYNTHROID, LEVOTHROID) 75 MCG tablet Take 75 mcg by mouth every morning.   . methylcellulose (ARTIFICIAL TEARS) 1 % ophthalmic solution Place 1 drop into both eyes as needed (dry eyes).   . Morphine Sulfate (MORPHINE CONCENTRATE) 10 mg / 0.5 ml concentrated solution Take 0.25 mLs (5 mg total) by mouth every 2 (two) hours as needed for moderate pain, severe pain, anxiety or shortness of breath (Headache, dyspnea, distress, agitation).  . sorbitol 70 % SOLN Take 30 mLs by mouth at bedtime.  . haloperidol (HALDOL) 2 MG/ML solution Take 0.5 mLs (1 mg total) by mouth every 6 (six) hours as needed for agitation.  . polyethylene glycol (MIRALAX / GLYCOLAX) packet Take 17 g by mouth daily as needed for mild constipation.  . [DISCONTINUED] benzonatate (TESSALON) 100 MG capsule Take 1 capsule (100 mg total) by mouth 3 (three) times daily.    No Known Allergies  Current Medications, Allergies, Past Medical History, Past Surgical History, Family History, and Social History were reviewed in [employer] record.    Review of Systems        See HPI - all other systems neg except as noted...  The patient complains of weight loss, decreased hearing, dyspnea on exertion,  headaches, severe indigestion/heartburn, muscle weakness, and difficulty walking.  The patient denies anorexia, fever, weight gain, vision loss, hoarseness, chest pain, syncope, peripheral edema, prolonged cough, hemoptysis, abdominal pain, melena, hematochezia, hematuria, incontinence, suspicious skin lesions, transient blindness, depression, unusual weight change, abnormal bleeding, enlarged lymph nodes, and angioedema.     Objective:   Physical Exam     WD, WN, 79 y/o WF in NAD... she is chr ill appearing... wt  GENERAL:  Alert & oriented; pleasant & cooperative... HEENT:  [place]/AT, EOM-wnl, PERRLA, EACs- sl wax, TMs-wnl, NOSE- nasal septal perf, THROAT-clear  NECK:  Supple w/ decrROM; no JVD; normal carotid impulses w/ 1+bruits; no thyromegaly or nodules palpated; no lymphadenopathy. CHEST:  Diminished BS ; without wheezes or rales, but scat rhonchi heard... HEART:  Regular Rhythm;  gr1/6 SEM without rubs or gallops detected... ABDOMEN:  Soft & nontender; normal bowel sounds; no organomegaly or masses palpated... EXT: without deformities, mild arthritic changes; no varicose veins/ +venous insuffic/ no edema. NEURO:  no focal neuro deficits, she is weak diffusely... .      Assessment & Plan:

## 2013-07-06 ENCOUNTER — Encounter: Payer: Self-pay | Admitting: Adult Health

## 2013-07-06 NOTE — Assessment & Plan Note (Signed)
Recurrent aspiration pneumonia. Patient is clinically, improving. Unfortunately, patient left without, chest x-ray today. Will recheck on return. Patient's continue on her current regimen. She is now under hospice care at home. Patient advised on aspiration precautions at home

## 2013-07-13 ENCOUNTER — Emergency Department (HOSPITAL_COMMUNITY)

## 2013-07-13 ENCOUNTER — Encounter (HOSPITAL_COMMUNITY): Payer: Self-pay | Admitting: Emergency Medicine

## 2013-07-13 ENCOUNTER — Emergency Department (HOSPITAL_COMMUNITY)
Admission: EM | Admit: 2013-07-13 | Discharge: 2013-07-13 | Disposition: A | Discharge status: Home / Self Care | Attending: Emergency Medicine | Admitting: Emergency Medicine

## 2013-07-13 DIAGNOSIS — K219 Gastro-esophageal reflux disease without esophagitis: Secondary | ICD-10-CM | POA: Insufficient documentation

## 2013-07-13 DIAGNOSIS — E039 Hypothyroidism, unspecified: Secondary | ICD-10-CM | POA: Insufficient documentation

## 2013-07-13 DIAGNOSIS — Z79899 Other long term (current) drug therapy: Secondary | ICD-10-CM | POA: Insufficient documentation

## 2013-07-13 DIAGNOSIS — I69922 Dysarthria following unspecified cerebrovascular disease: Secondary | ICD-10-CM | POA: Insufficient documentation

## 2013-07-13 DIAGNOSIS — F411 Generalized anxiety disorder: Secondary | ICD-10-CM | POA: Insufficient documentation

## 2013-07-13 DIAGNOSIS — D649 Anemia, unspecified: Secondary | ICD-10-CM | POA: Insufficient documentation

## 2013-07-13 DIAGNOSIS — K5900 Constipation, unspecified: Secondary | ICD-10-CM | POA: Insufficient documentation

## 2013-07-13 DIAGNOSIS — E7800 Pure hypercholesterolemia, unspecified: Secondary | ICD-10-CM | POA: Insufficient documentation

## 2013-07-13 DIAGNOSIS — I872 Venous insufficiency (chronic) (peripheral): Secondary | ICD-10-CM | POA: Insufficient documentation

## 2013-07-13 DIAGNOSIS — Z515 Encounter for palliative care: Secondary | ICD-10-CM | POA: Insufficient documentation

## 2013-07-13 DIAGNOSIS — M5030 Other cervical disc degeneration, unspecified cervical region: Secondary | ICD-10-CM | POA: Insufficient documentation

## 2013-07-13 DIAGNOSIS — F0150 Vascular dementia without behavioral disturbance: Secondary | ICD-10-CM | POA: Insufficient documentation

## 2013-07-13 DIAGNOSIS — R42 Dizziness and giddiness: Secondary | ICD-10-CM | POA: Insufficient documentation

## 2013-07-13 DIAGNOSIS — G459 Transient cerebral ischemic attack, unspecified: Secondary | ICD-10-CM | POA: Insufficient documentation

## 2013-07-13 DIAGNOSIS — Z8673 Personal history of transient ischemic attack (TIA), and cerebral infarction without residual deficits: Secondary | ICD-10-CM | POA: Insufficient documentation

## 2013-07-13 DIAGNOSIS — K589 Irritable bowel syndrome without diarrhea: Secondary | ICD-10-CM | POA: Insufficient documentation

## 2013-07-13 DIAGNOSIS — R51 Headache: Secondary | ICD-10-CM | POA: Insufficient documentation

## 2013-07-13 DIAGNOSIS — I10 Essential (primary) hypertension: Secondary | ICD-10-CM | POA: Insufficient documentation

## 2013-07-13 DIAGNOSIS — M1990 Unspecified osteoarthritis, unspecified site: Secondary | ICD-10-CM | POA: Insufficient documentation

## 2013-07-13 DIAGNOSIS — E538 Deficiency of other specified B group vitamins: Secondary | ICD-10-CM | POA: Insufficient documentation

## 2013-07-13 DIAGNOSIS — R531 Weakness: Secondary | ICD-10-CM

## 2013-07-13 DIAGNOSIS — E871 Hypo-osmolality and hyponatremia: Secondary | ICD-10-CM | POA: Insufficient documentation

## 2013-07-13 DIAGNOSIS — I69959 Hemiplegia and hemiparesis following unspecified cerebrovascular disease affecting unspecified side: Secondary | ICD-10-CM | POA: Insufficient documentation

## 2013-07-13 DIAGNOSIS — Z8619 Personal history of other infectious and parasitic diseases: Secondary | ICD-10-CM | POA: Insufficient documentation

## 2013-07-13 DIAGNOSIS — N390 Urinary tract infection, site not specified: Secondary | ICD-10-CM | POA: Insufficient documentation

## 2013-07-13 HISTORY — DX: Encounter for palliative care: Z51.5

## 2013-07-13 LAB — DIFFERENTIAL
Basophils Absolute: 0 10*3/uL (ref 0.0–0.1)
Basophils Relative: 0 % (ref 0–1)
Eosinophils Absolute: 0.1 10*3/uL (ref 0.0–0.7)
Lymphocytes Relative: 48 % — ABNORMAL HIGH (ref 12–46)
Monocytes Absolute: 0.5 10*3/uL (ref 0.1–1.0)
Monocytes Relative: 11 % (ref 3–12)
Neutrophils Relative %: 39 % — ABNORMAL LOW (ref 43–77)

## 2013-07-13 LAB — COMPREHENSIVE METABOLIC PANEL
Albumin: 3.7 g/dL (ref 3.5–5.2)
BUN: 12 mg/dL (ref 6–23)
Chloride: 86 mEq/L — ABNORMAL LOW (ref 96–112)
Creatinine, Ser: 0.61 mg/dL (ref 0.50–1.10)
Sodium: 124 mEq/L — ABNORMAL LOW (ref 135–145)
Total Protein: 7.1 g/dL (ref 6.0–8.3)

## 2013-07-13 LAB — CBC
HCT: 25.8 % — ABNORMAL LOW (ref 36.0–46.0)
Hemoglobin: 9.2 g/dL — ABNORMAL LOW (ref 12.0–15.0)
MCH: 31.7 pg (ref 26.0–34.0)
MCHC: 35.7 g/dL (ref 30.0–36.0)
Platelets: 200 10*3/uL (ref 150–400)
RDW: 14.3 % (ref 11.5–15.5)
WBC: 4.4 10*3/uL (ref 4.0–10.5)

## 2013-07-13 LAB — URINE MICROSCOPIC-ADD ON

## 2013-07-13 LAB — URINALYSIS, ROUTINE W REFLEX MICROSCOPIC
Glucose, UA: NEGATIVE mg/dL
Ketones, ur: NEGATIVE mg/dL
Nitrite: NEGATIVE
Protein, ur: 30 mg/dL — AB
Specific Gravity, Urine: 1.007 (ref 1.005–1.030)

## 2013-07-13 LAB — PROTIME-INR
INR: 1 (ref 0.00–1.49)
Prothrombin Time: 13 seconds (ref 11.6–15.2)

## 2013-07-13 LAB — RAPID URINE DRUG SCREEN, HOSP PERFORMED
Amphetamines: NOT DETECTED
Benzodiazepines: NOT DETECTED
Opiates: NOT DETECTED

## 2013-07-13 LAB — POCT I-STAT TROPONIN I
Troponin i, poc: 0.01 ng/mL (ref 0.00–0.08)
Troponin i, poc: 0.01 ng/mL (ref 0.00–0.08)

## 2013-07-13 LAB — TROPONIN I: Troponin I: <0.3 ng/mL (ref <0.30)

## 2013-07-13 LAB — APTT: aPTT: 32 seconds (ref 24–37)

## 2013-07-13 LAB — GLUCOSE, CAPILLARY: Glucose-Capillary: 112 mg/dL — ABNORMAL HIGH (ref 70–99)

## 2013-07-13 MED ORDER — DEXTROSE 5 % IV SOLN
2.0000 g | INTRAVENOUS | Status: AC
  Administered 2013-07-13: 2 g via INTRAVENOUS
  Filled 2013-07-13: qty 2

## 2013-07-13 MED ORDER — DEXTROSE 5 % IV SOLN: 100.0000 mg/kg | Freq: Once | INTRAVENOUS | Status: DC

## 2013-07-13 MED ORDER — ACETAMINOPHEN 160 MG/5ML PO SOLN
325.0000 mg | Freq: Once | ORAL | Status: AC
  Administered 2013-07-13: 325 mg via ORAL
  Filled 2013-07-13: qty 20.3

## 2013-07-13 MED ORDER — SODIUM CHLORIDE 0.9 % IV BOLUS (SEPSIS)
1000.0000 mL | Freq: Once | INTRAVENOUS | Status: AC
  Administered 2013-07-13: 1000 mL via INTRAVENOUS

## 2013-07-13 MED ORDER — CIPROFLOXACIN HCL 250 MG PO TABS: 250.0000 mg | ORAL_TABLET | Freq: Two times a day (BID) | ORAL | Status: DC

## 2013-07-13 NOTE — Progress Notes (Signed)
Room [facility] ED 08 - Sandria Senter - [facility] RN Visit-R.Dearion Huot RN  Related admission to [facility] diagnosis of CVA.   Pt is DNR code.    Pt alert,  lying on ED stretcher, with complaints of headache, pain in BLE.   Dtr Malachi Bonds  present.  Patient's home medication list is on shadow chart.   Pt had spell of non responsiveness x 5 min while in car with dtr.  Dtr states pt had R/sided weakness-unable to grip with R/hand-Left handed grip normal per dtr, complained of dizziness.  Dtr called EMS & [facility].     Please call [facility] @ [phone]- ask for RN Liaison or after hours,ask for on-call RN with any hospice needs.   Thank you.  Joneen Boers, RN  [facility]  Hospice Liaison  [phone])

## 2013-07-13 NOTE — ED Notes (Signed)
Family at bedside. 

## 2013-07-13 NOTE — ED Notes (Signed)
Patient is a resident at a nursing home at [address]  According to EMS, patient was riding with the daughter they went to get some lunch.  The patient did not eat her lunch because she did not feel "right".  According to the daughter, she told EMS her mother passed out for about 90 seconds.  The daughter pulled over and called EMS.  The patient was transported here without incident.  EMS did a stroke screen and she was negative.
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
## 2013-07-13 NOTE — ED Notes (Signed)
Patient transported to CT 

## 2013-07-13 NOTE — ED Notes (Signed)
Phlebotomy at BS

## 2013-07-14 ENCOUNTER — Telehealth: Payer: Self-pay | Admitting: Pulmonary Disease

## 2013-07-14 MED ORDER — LEVOFLOXACIN 25 MG/ML PO SOLN: ORAL | Status: DC

## 2013-07-14 NOTE — Telephone Encounter (Signed)
Spoke with Carol-hospice nurse for patient; aware that I will send message to SN to advise on elixir and qty, sig, etc. Will call back once taken care of.  SN please advise. Thanks.

## 2013-07-14 NOTE — Telephone Encounter (Signed)
Called and spoke with carol with hospice and she is aware of SN recs.    Ok to call in levaquin oral solution25 mg/ml 1 tablespoon bid  rx has been sent to [facility] and carol is aware.

## 2013-07-14 NOTE — ED Provider Notes (Signed)
Lesion seen and evaluated. Results of the studies were discussed. Discussed her care with her daughter and son-in-law. Instructions are for her to continue her care at home and hospice environment. She is given IV antibiotics for her urinary tract infection. They will recheck with any change in her condition.  Medical screening examination/treatment/procedure(s) were conducted as a shared visit with non-physician practitioner(s) and myself.  I personally evaluated the patient during the encounter.  EKG Interpretation    Date/Time:  Monday July 13 2013 14:56:21 EST Ventricular Rate:  85 PR Interval:    QRS Duration: 88 QT Interval:  378 QTC Calculation: 449 R Axis:   -16 Text Interpretation:  Sinus Rhythm (confirmed with concurrent thythm strips) Borderline left axis deviation Anterior infarct, old ST elevation, consider inferior injury Artifact in lead(s) I II III aVR aVL aVF Confirmed by Fayrene Fearing  MD, Dajsha Massaro ([id number]) on 07/13/2013 4:08:05 PM              Roney Marion, MD 07/14/13 1549

## 2013-07-15 LAB — URINE CULTURE: Colony Count: >=100000

## 2013-07-18 ENCOUNTER — Encounter (HOSPITAL_COMMUNITY): Payer: Self-pay | Admitting: Emergency Medicine

## 2013-07-18 ENCOUNTER — Observation Stay (HOSPITAL_COMMUNITY)
Admission: EM | Admit: 2013-07-18 | Discharge: 2013-07-20 | Disposition: A | Discharge status: Hospice | Attending: Family Medicine | Admitting: Family Medicine

## 2013-07-18 ENCOUNTER — Emergency Department (HOSPITAL_COMMUNITY)

## 2013-07-18 ENCOUNTER — Other Ambulatory Visit: Payer: Self-pay

## 2013-07-18 DIAGNOSIS — G20A1 Parkinson's disease without dyskinesia, without mention of fluctuations: Secondary | ICD-10-CM

## 2013-07-18 DIAGNOSIS — M5030 Other cervical disc degeneration, unspecified cervical region: Secondary | ICD-10-CM

## 2013-07-18 DIAGNOSIS — F0150 Vascular dementia without behavioral disturbance: Secondary | ICD-10-CM | POA: Insufficient documentation

## 2013-07-18 DIAGNOSIS — I10 Essential (primary) hypertension: Secondary | ICD-10-CM

## 2013-07-18 DIAGNOSIS — R636 Underweight: Secondary | ICD-10-CM

## 2013-07-18 DIAGNOSIS — N179 Acute kidney failure, unspecified: Secondary | ICD-10-CM

## 2013-07-18 DIAGNOSIS — M1990 Unspecified osteoarthritis, unspecified site: Secondary | ICD-10-CM

## 2013-07-18 DIAGNOSIS — G20 Parkinson's disease: Secondary | ICD-10-CM

## 2013-07-18 DIAGNOSIS — E538 Deficiency of other specified B group vitamins: Secondary | ICD-10-CM

## 2013-07-18 DIAGNOSIS — D649 Anemia, unspecified: Secondary | ICD-10-CM

## 2013-07-18 DIAGNOSIS — K219 Gastro-esophageal reflux disease without esophagitis: Secondary | ICD-10-CM

## 2013-07-18 DIAGNOSIS — J449 Chronic obstructive pulmonary disease, unspecified: Secondary | ICD-10-CM

## 2013-07-18 DIAGNOSIS — R296 Repeated falls: Secondary | ICD-10-CM

## 2013-07-18 DIAGNOSIS — J329 Chronic sinusitis, unspecified: Secondary | ICD-10-CM

## 2013-07-18 DIAGNOSIS — F411 Generalized anxiety disorder: Secondary | ICD-10-CM

## 2013-07-18 DIAGNOSIS — R55 Syncope and collapse: Secondary | ICD-10-CM

## 2013-07-18 DIAGNOSIS — B029 Zoster without complications: Secondary | ICD-10-CM

## 2013-07-18 DIAGNOSIS — L501 Idiopathic urticaria: Secondary | ICD-10-CM

## 2013-07-18 DIAGNOSIS — R531 Weakness: Secondary | ICD-10-CM

## 2013-07-18 DIAGNOSIS — I672 Cerebral atherosclerosis: Secondary | ICD-10-CM | POA: Insufficient documentation

## 2013-07-18 DIAGNOSIS — E079 Disorder of thyroid, unspecified: Secondary | ICD-10-CM

## 2013-07-18 DIAGNOSIS — J690 Pneumonitis due to inhalation of food and vomit: Secondary | ICD-10-CM

## 2013-07-18 DIAGNOSIS — E039 Hypothyroidism, unspecified: Secondary | ICD-10-CM

## 2013-07-18 DIAGNOSIS — N390 Urinary tract infection, site not specified: Secondary | ICD-10-CM

## 2013-07-18 DIAGNOSIS — R410 Disorientation, unspecified: Secondary | ICD-10-CM

## 2013-07-18 DIAGNOSIS — R1310 Dysphagia, unspecified: Secondary | ICD-10-CM

## 2013-07-18 DIAGNOSIS — E871 Hypo-osmolality and hyponatremia: Secondary | ICD-10-CM

## 2013-07-18 DIAGNOSIS — R627 Adult failure to thrive: Secondary | ICD-10-CM

## 2013-07-18 DIAGNOSIS — K6289 Other specified diseases of anus and rectum: Secondary | ICD-10-CM

## 2013-07-18 DIAGNOSIS — G459 Transient cerebral ischemic attack, unspecified: Principal | ICD-10-CM

## 2013-07-18 DIAGNOSIS — I872 Venous insufficiency (chronic) (peripheral): Secondary | ICD-10-CM

## 2013-07-18 DIAGNOSIS — E7800 Pure hypercholesterolemia, unspecified: Secondary | ICD-10-CM

## 2013-07-18 DIAGNOSIS — S62109A Fracture of unspecified carpal bone, unspecified wrist, initial encounter for closed fracture: Secondary | ICD-10-CM

## 2013-07-18 DIAGNOSIS — Z515 Encounter for palliative care: Secondary | ICD-10-CM

## 2013-07-18 DIAGNOSIS — I639 Cerebral infarction, unspecified: Secondary | ICD-10-CM

## 2013-07-18 DIAGNOSIS — F0390 Unspecified dementia without behavioral disturbance: Secondary | ICD-10-CM

## 2013-07-18 DIAGNOSIS — K5900 Constipation, unspecified: Secondary | ICD-10-CM

## 2013-07-18 DIAGNOSIS — J4489 Other specified chronic obstructive pulmonary disease: Secondary | ICD-10-CM

## 2013-07-18 DIAGNOSIS — A419 Sepsis, unspecified organism: Secondary | ICD-10-CM

## 2013-07-18 DIAGNOSIS — I679 Cerebrovascular disease, unspecified: Secondary | ICD-10-CM

## 2013-07-18 DIAGNOSIS — I5032 Chronic diastolic (congestive) heart failure: Secondary | ICD-10-CM

## 2013-07-18 LAB — CBC
Hemoglobin: 9.9 g/dL — ABNORMAL LOW (ref 12.0–15.0)
MCHC: 36.5 g/dL — ABNORMAL HIGH (ref 30.0–36.0)
MCV: 86.9 fL (ref 78.0–100.0)
Platelets: 205 10*3/uL (ref 150–400)
RDW: 14.1 % (ref 11.5–15.5)

## 2013-07-18 LAB — URINE MICROSCOPIC-ADD ON

## 2013-07-18 LAB — BASIC METABOLIC PANEL
CO2: 28 mEq/L (ref 19–32)
GFR calc Af Amer: 90 mL/min — ABNORMAL LOW (ref >90)
GFR calc non Af Amer: 78 mL/min — ABNORMAL LOW (ref >90)
Potassium: 3.4 mEq/L — ABNORMAL LOW (ref 3.5–5.1)
Sodium: 124 mEq/L — ABNORMAL LOW (ref 135–145)

## 2013-07-18 LAB — URINALYSIS, ROUTINE W REFLEX MICROSCOPIC
Bilirubin Urine: NEGATIVE
Leukocytes, UA: NEGATIVE
Nitrite: NEGATIVE
Protein, ur: 100 mg/dL — AB
Specific Gravity, Urine: 1.007 (ref 1.005–1.030)
Urobilinogen, UA: 0.2 mg/dL (ref 0.0–1.0)

## 2013-07-18 MED ORDER — ACETAMINOPHEN 160 MG/5ML PO SOLN: 650.0000 mg | Freq: Four times a day (QID) | ORAL | Status: DC | PRN

## 2013-07-18 MED ORDER — CIPROFLOXACIN HCL 250 MG PO TABS
250.0000 mg | ORAL_TABLET | Freq: Two times a day (BID) | ORAL | Status: DC
  Administered 2013-07-18 – 2013-07-20 (×5): 250 mg via ORAL
  Filled 2013-07-18 (×7): qty 1

## 2013-07-18 MED ORDER — SODIUM CHLORIDE 0.9 % IV SOLN
INTRAVENOUS | Status: AC
  Administered 2013-07-18: 1000 mL via INTRAVENOUS

## 2013-07-18 MED ORDER — DOCUSATE SODIUM 50 MG/5ML PO LIQD
100.0000 mg | Freq: Every day | ORAL | Status: DC | PRN
  Administered 2013-07-19: 100 mg via ORAL
  Filled 2013-07-18: qty 10

## 2013-07-18 MED ORDER — SODIUM CHLORIDE 0.9 % IV SOLN
INTRAVENOUS | Status: DC
  Administered 2013-07-18: 03:00:00 via INTRAVENOUS

## 2013-07-18 MED ORDER — MORPHINE SULFATE (CONCENTRATE) 10 MG /0.5 ML PO SOLN
5.0000 mg | ORAL | Status: DC | PRN
  Administered 2013-07-20: 5 mg via ORAL
  Filled 2013-07-18: qty 0.5

## 2013-07-18 MED ORDER — LANSOPRAZOLE 3 MG/ML PO SUSP
30.0000 mg | Freq: Every day | ORAL | Status: DC
  Administered 2013-07-18 – 2013-07-19 (×2): 30 mg via ORAL
  Filled 2013-07-18 (×3): qty 10

## 2013-07-18 MED ORDER — FENTANYL CITRATE 0.05 MG/ML IJ SOLN
50.0000 ug | INTRAMUSCULAR | Status: DC | PRN
  Administered 2013-07-18: 50 ug via INTRAVENOUS
  Filled 2013-07-18: qty 2

## 2013-07-18 MED ORDER — SORBITOL 70 % SOLN
30.0000 mL | Freq: Every day | Status: DC
  Administered 2013-07-18: 30 mL via ORAL
  Filled 2013-07-18 (×3): qty 30

## 2013-07-18 MED ORDER — LANSOPRAZOLE 3 MG/ML PO SUSP
30.0000 mg | Freq: Every day | ORAL | Status: DC
  Filled 2013-07-18 (×2): qty 10

## 2013-07-18 MED ORDER — ASPIRIN 325 MG PO TABS
325.0000 mg | ORAL_TABLET | Freq: Every day | ORAL | Status: DC
  Administered 2013-07-19 – 2013-07-20 (×2): 325 mg via ORAL
  Filled 2013-07-18 (×3): qty 1

## 2013-07-18 MED ORDER — CHLORHEXIDINE GLUCONATE CLOTH 2 % EX PADS
6.0000 | MEDICATED_PAD | Freq: Every day | CUTANEOUS | Status: DC
  Administered 2013-07-18 – 2013-07-20 (×2): 6 via TOPICAL

## 2013-07-18 MED ORDER — SODIUM CHLORIDE 0.9 % IV SOLN
INTRAVENOUS | Status: DC
  Administered 2013-07-19 – 2013-07-20 (×3): via INTRAVENOUS

## 2013-07-18 MED ORDER — METHYLCELLULOSE 1 % OP SOLN
1.0000 [drp] | OPHTHALMIC | Status: DC | PRN
  Filled 2013-07-18: qty 15

## 2013-07-18 MED ORDER — LEVOTHYROXINE SODIUM 75 MCG PO TABS
75.0000 ug | ORAL_TABLET | Freq: Every morning | ORAL | Status: DC
  Administered 2013-07-18 – 2013-07-20 (×3): 75 ug via ORAL
  Filled 2013-07-18 (×3): qty 1

## 2013-07-18 MED ORDER — ENSURE COMPLETE PO LIQD
237.0000 mL | Freq: Every day | ORAL | Status: DC
  Administered 2013-07-18 – 2013-07-20 (×3): 237 mL via ORAL

## 2013-07-18 MED ORDER — HALOPERIDOL LACTATE 5 MG/ML IJ SOLN
2.0000 mg | Freq: Four times a day (QID) | INTRAMUSCULAR | Status: DC | PRN
  Administered 2013-07-18 – 2013-07-19 (×2): 2 mg via INTRAVENOUS
  Filled 2013-07-18 (×2): qty 1

## 2013-07-18 MED ORDER — HALOPERIDOL LACTATE 2 MG/ML PO CONC
1.0000 mg | Freq: Four times a day (QID) | ORAL | Status: DC | PRN
  Filled 2013-07-18: qty 0.5

## 2013-07-18 MED ORDER — MUPIROCIN 2 % EX OINT
1.0000 "application " | TOPICAL_OINTMENT | Freq: Two times a day (BID) | CUTANEOUS | Status: DC
  Administered 2013-07-18 – 2013-07-20 (×4): 1 via NASAL
  Filled 2013-07-18: qty 22

## 2013-07-18 NOTE — Progress Notes (Signed)
Nutrition Brief Note  Chart reviewed. Pt is followed by hospice and is now transitioning to comfort care.  No further nutrition interventions warranted at this time.  Please re-consult as needed.   Weekend RD coverage:  Loyce Dys, MS RD LDN Weekend/After hours pager: [phone]

## 2013-07-18 NOTE — ED Notes (Signed)
Report given to [address] RN

## 2013-07-18 NOTE — ED Provider Notes (Signed)
CSN: [id number]     Arrival date & time 07/18/13  0303 History   First MD Initiated Contact with Patient 07/18/13 [phone]     Chief Complaint  Patient presents with  . Headache   (Consider location/radiation/quality/duration/timing/severity/associated sxs/prior Treatment) Patient is a 79 y.o. female presenting with headaches.  Headache Associated symptoms: no abdominal pain, no back pain, no pain, no fever, no neck pain and no neck stiffness    History per EMS and patient- called out for assistance to help getting patient off of the commode. She was having severe headache for the last 2 days, relieved with Tylenol, sat down on the toilet tonight and headache returned and unable to get up due to generalized weakness. She has some lower back pain that is not new for her. She denies any lateral weakness or numbness. She denies any difficulty with speech. Patient is a poor historian. She denies any headache at this time.  Family present at bedside and express concern for possible TIA with significant history of stroke and TIAs in the past.   Past Medical History  Diagnosis Date  . Unspecified sinusitis (chronic)   . Unspecified essential hypertension   . Cerebrovascular disease, unspecified     hx of multiple strokes  . Unspecified venous (peripheral) insufficiency   . Pure hypercholesterolemia   . Hypothyroidism   . Esophageal reflux   . Unspecified constipation   . Urinary tract infection, site not specified   . Osteoarthrosis, unspecified whether generalized or localized, unspecified site   . Degeneration of cervical intervertebral disc   . Closed fracture of other bone of wrist   . Anxiety state, unspecified   . Idiopathic urticaria   . Anemia, unspecified   . Other B-complex deficiencies   . Herpes zoster without mention of complication   . IBS (irritable bowel syndrome)   . Vascular dementia     wtih sundowning  . Hyponatremia   . Hospice care patient     [facility] [place]- call [phone] with any hospice needs, admissions, discharge   Past Surgical History  Procedure Laterality Date  . Tonsillectomy    . Total abdominal hysterectomy w/ bilateral salpingoophorectomy    . Cervical fusion    . Cataract extraction    . Nasal sinus surgery    . Esophagogastroduodenoscopy  09/10/2011    Procedure: ESOPHAGOGASTRODUODENOSCOPY (EGD);  Surgeon: Petra Kuba, MD;  Location: [facility] ENDOSCOPY;  Service: Endoscopy;  Laterality: N/A;  c-arm needed  . Savory dilation  09/10/2011    Procedure: SAVORY DILATION;  Surgeon: Petra Kuba, MD;  Location: [facility] ENDOSCOPY;  Service: Endoscopy;  Laterality: N/A;  . Esophagogastroduodenoscopy  05/13/2012    Procedure: ESOPHAGOGASTRODUODENOSCOPY (EGD);  Surgeon: Petra Kuba, MD;  Location: [facility] ENDOSCOPY;  Service: Endoscopy;  Laterality: N/A;  with carm  . Savory dilation  05/13/2012    Procedure: SAVORY DILATION;  Surgeon: Petra Kuba, MD;  Location: [facility] ENDOSCOPY;  Service: Endoscopy;  Laterality: N/A;   Family History  Problem Relation Age of Onset  . Diabetes Father   . Heart failure Mother   . Diabetes Sister     multiple  . Stroke Sister     multiple  . Stroke Mother    History  Substance Use Topics  . Smoking status: Never Smoker   . Smokeless tobacco: Never Used  . Alcohol Use: No   OB History   Grav Para Term Preterm Abortions TAB SAB Ect Mult Living  Review of Systems  Constitutional: Negative for fever and chills.  Eyes: Negative for pain.  Respiratory: Negative for shortness of breath.   Cardiovascular: Negative for chest pain.  Gastrointestinal: Negative for abdominal pain.  Genitourinary: Negative for dysuria.  Musculoskeletal: Negative for back pain, neck pain and neck stiffness.  Skin: Negative for rash.  Neurological: Positive for headaches.  All other systems reviewed and are negative.    Allergies  Review of patient's allergies indicates no known allergies.  Home  Medications   Current Outpatient Rx  Name  Route  Sig  Dispense  Refill  . acetaminophen (TYLENOL) 160 MG/5ML solution   Oral   Take 650 mg by mouth every 6 (six) hours as needed for mild pain or moderate pain.         . ciprofloxacin (CIPRO) 250 MG tablet   Oral   Take 1 tablet (250 mg total) by mouth every 12 (twelve) hours.   10 tablet   0   . docusate (COLACE) 50 MG/5ML liquid   Oral   Take 10 mLs (100 mg total) by mouth daily as needed for mild constipation.   100 mL   0   . feeding supplement (ENSURE COMPLETE) LIQD   Oral   Take 237 mLs by mouth daily.         . haloperidol (HALDOL) 2 MG/ML solution   Oral   Take 0.5 mLs (1 mg total) by mouth every 6 (six) hours as needed for agitation.   120 mL   0   . Lansoprazole 3 MG/ML SUSP   Oral   Take 30 mg by mouth daily.   500 mL   0   . levofloxacin (LEVAQUIN) 25 MG/ML solution      Take 1 tablespoon by mouth two times daily   150 mL   0     Pt is a hospice pt.  Pt will take this medication  ...   . levothyroxine (SYNTHROID, LEVOTHROID) 75 MCG tablet   Oral   Take 75 mcg by mouth every morning.          . methylcellulose (ARTIFICIAL TEARS) 1 % ophthalmic solution   Both Eyes   Place 1 drop into both eyes as needed (dry eyes).          . Morphine Sulfate (MORPHINE CONCENTRATE) 10 mg / 0.5 ml concentrated solution   Oral   Take 0.25 mLs (5 mg total) by mouth every 2 (two) hours as needed for moderate pain, severe pain, anxiety or shortness of breath (Headache, dyspnea, distress, agitation).   30 mL   0   . sorbitol 70 % SOLN   Oral   Take 30 mLs by mouth at bedtime.   1000 mL   0    BP 137/84  Pulse 96  Temp(Src) 97.8 F (36.6 C) (Oral)  Resp 21  SpO2 97% Physical Exam  Constitutional: She appears well-developed and well-nourished.  HENT:  Head: Normocephalic and atraumatic.  Mouth/Throat: Oropharynx is clear and moist.  Eyes: EOM are normal. Pupils are equal, round, and reactive to  light.  Neck: Neck supple.  Cardiovascular: Regular rhythm and intact distal pulses.   tachycardic  Pulmonary/Chest: Effort normal and breath sounds normal. No respiratory distress. She exhibits no tenderness.  Abdominal: Soft. She exhibits no distension.  Musculoskeletal: Normal range of motion. She exhibits no edema.  Neurological:  Awake, alert and oriented, follows commands, no unilateral deficits, speech clear  Skin: Skin is warm and dry.  ED Course  Procedures (including critical care time) Labs Review Labs Reviewed  CBC - Abnormal; Notable for the following:    RBC 3.12 (*)    Hemoglobin 9.9 (*)    HCT 27.1 (*)    MCHC 36.5 (*)    All other components within normal limits  BASIC METABOLIC PANEL - Abnormal; Notable for the following:    Sodium 124 (*)    Potassium 3.4 (*)    Chloride 86 (*)    Glucose, Bld 117 (*)    GFR calc non Af Amer 78 (*)    GFR calc Af Amer 90 (*)    All other components within normal limits  URINALYSIS, ROUTINE W REFLEX MICROSCOPIC   Imaging Review Ct Head Wo Contrast  07/18/2013   CLINICAL DATA:  Headache and weakness for 2 days.  EXAM: CT HEAD WITHOUT CONTRAST  TECHNIQUE: Contiguous axial images were obtained from the base of the skull through the vertex without intravenous contrast.  COMPARISON:  CT of the head performed 07/13/2013  FINDINGS: There is no evidence of acute infarction, mass lesion, or intra- or extra-axial hemorrhage on CT.  Diffuse bilateral perinephric and subcortical white matter change likely reflects small vessel ischemic microangiopathy.  The posterior fossa, including the cerebellum, brainstem and fourth ventricle, is within normal limits. The third and lateral ventricles, and basal ganglia are unremarkable in appearance. The cerebral hemispheres demonstrate grossly normal gray-white differentiation. No mass effect or midline shift is seen.  There is no evidence of fracture; visualized osseous structures are unremarkable in  appearance. The visualized portions of the orbits are within normal limits. The paranasal sinuses and mastoid air cells are well-aerated. No significant soft tissue abnormalities are seen.  IMPRESSION: 1. No acute intracranial pathology seen on CT. 2. Diffuse small vessel ischemic microangiopathy.   Electronically Signed   By: Roanna Raider M.D.   On: 07/18/2013 04:13    Date: 07/18/2013  Rate: 116  Rhythm: atrial fibrillation  QRS Axis: normal  Intervals: normal  ST/T Wave abnormalities: nonspecific ST changes  Conduction Disutrbances:none  Narrative Interpretation:   Old EKG Reviewed: previous EKG dated 07/13/2013 demonstrates artifact questionable P waves otherwise no significant changes  IVfs provided  Family present at bedside, state that she is on hospice care and today her requesting that she be admitted to the hospital for comfort care. Their main concern is that she is having mini strokes at home. PT denies any unilateral symptoms at this time. No headache at this time  4:54 AM medicine consult requested and discussed with Dr. Adela Glimpse - she will evaluated bedside.  MDM  Diagnosis: TIA  EKG, CT scan, labs reviewed as above No return of symptoms in emergency department Medicine consult/ admit    Sunnie Nielsen, MD 07/18/13 [phone]

## 2013-07-18 NOTE — Progress Notes (Signed)
cbg <140 upon admission with no history of DM.  Not checking CBG's per protocol.  Lance Bosch, RN

## 2013-07-18 NOTE — ED Notes (Signed)
Patient transported to CT via stretcher.

## 2013-07-18 NOTE — Progress Notes (Addendum)
Patient arrived to unit and made comfortable in bed.  Oriented to unit and toileting provided.  Will continue to monitor. Dr. Magdalene Molly says to offer fluids but was told that patient failed her bedside swallow in the ED. Paged Dr. Sharl Ma due to po meds on worklist and no SLP consult. Will not be offering fluids. Lance Bosch, RN

## 2013-07-18 NOTE — Evaluation (Addendum)
Clinical/Bedside Swallow Evaluation Patient Details  Name: Margaret [name] MRN: [id number] Date of Birth: 1924-02-08  Today's Date: 07/18/2013 Time: 4098-1191 SLP Time Calculation (min): 35 min  Past Medical History:  Past Medical History  Diagnosis Date  . Unspecified sinusitis (chronic)   . Unspecified essential hypertension   . Cerebrovascular disease, unspecified     hx of multiple strokes  . Unspecified venous (peripheral) insufficiency   . Pure hypercholesterolemia   . Hypothyroidism   . Esophageal reflux   . Unspecified constipation   . Urinary tract infection, site not specified   . Osteoarthrosis, unspecified whether generalized or localized, unspecified site   . Degeneration of cervical intervertebral disc   . Closed fracture of other bone of wrist   . Anxiety state, unspecified   . Idiopathic urticaria   . Anemia, unspecified   . Other B-complex deficiencies   . Herpes zoster without mention of complication   . IBS (irritable bowel syndrome)   . Vascular dementia     wtih sundowning  . Hyponatremia   . Hospice care patient     [facility] [place]- call [phone] with any hospice needs, admissions, discharge   Past Surgical History:  Past Surgical History  Procedure Laterality Date  . Tonsillectomy    . Total abdominal hysterectomy w/ bilateral salpingoophorectomy    . Cervical fusion    . Cataract extraction    . Nasal sinus surgery    . Esophagogastroduodenoscopy  09/10/2011    Procedure: ESOPHAGOGASTRODUODENOSCOPY (EGD);  Surgeon: Petra Kuba, MD;  Location: [facility] ENDOSCOPY;  Service: Endoscopy;  Laterality: N/A;  c-arm needed  . Savory dilation  09/10/2011    Procedure: SAVORY DILATION;  Surgeon: Petra Kuba, MD;  Location: [facility] ENDOSCOPY;  Service: Endoscopy;  Laterality: N/A;  . Esophagogastroduodenoscopy  05/13/2012    Procedure: ESOPHAGOGASTRODUODENOSCOPY (EGD);  Surgeon: Petra Kuba, MD;  Location: [facility] ENDOSCOPY;  Service: Endoscopy;   Laterality: N/A;  with carm  . Savory dilation  05/13/2012    Procedure: SAVORY DILATION;  Surgeon: Petra Kuba, MD;  Location: [facility] ENDOSCOPY;  Service: Endoscopy;  Laterality: N/A;   HPI:  Patient resides at independent living she has someone with her while she is awake. She has had a few weak spells. This episodes last a few minutes. She stairs into space but seem not to understand. After the episode she develops headache. She has had this episodes in the past few days. She has had some pains and aches overall. Decreased PO Intake. She is on hospice at this point.     Assessment / Plan / Recommendation Clinical Impression  Pt with chronic phrayngeal/ esophageal dysphagia demonstrated overt s/s of aspiration including consistent immediate throat clear following all consistencies, and a delayed cough following solids. Pt had a prolonged oral phase with solids and had some oral residue. MBS in 3/14 revealed mod-severe pharyngeal/ esophageal dysphagia with dysfunction at the UES, causing stasis in the pharynx. This is likely the source of current difficulties. Given these findings, pt is at high risk of aspiration. Reviewed risks of aspiration with daughter who demonstrated understanding and expressed that they do not wish to further assess swallow function and would like to proceed with comfort feeds. Given this, rx dysphagia 2 diet/ thin liquids, encouraging small bites and sips and alternating food with liquid; sit upright 90 degrees at least 30 minutes after eating. Meds whole in puree. Given comfort care status, ST will sign off at this time; please  reconsult if needed.    Aspiration Risk  Severe    Diet Recommendation Dysphagia 2 (Fine chop);Thin liquid   Liquid Administration via: Cup;Straw Medication Administration: Whole meds with puree Supervision: Full supervision/cueing for compensatory strategies Compensations: Slow rate;Small sips/bites;Follow solids with liquid Postural Changes and/or  Swallow Maneuvers: Seated upright 90 degrees;Upright 30-60 min after meal    Other  Recommendations Oral Care Recommendations: Oral care BID   Follow Up Recommendations  None    Frequency and Duration        Pertinent Vitals/Pain n/a    SLP Swallow Goals     Swallow Study Prior Functional Status       General HPI: Patient resides at independent living she has someone with her while she is awake. She has had a few weak spells. This episodes last a few minutes. She stairs into space but seem not to understand. After the episode she develops headache. She has had this episodes in the past few days. She has had some pains and aches overall. Decreased PO Intake. She is on hospice at this point.   Type of Study: Bedside swallow evaluation Previous Swallow Assessment: MBS March 2014 revealed mod-severe dysphagia of pharynx and cervical esophagus- POs getting hung in pharynx due to UES dysfunction Diet Prior to this Study: NPO Temperature Spikes Noted: No Respiratory Status: Room air Behavior/Cognition: Alert;Cooperative;Pleasant mood Oral Cavity - Dentition: Adequate natural dentition Self-Feeding Abilities: Needs assist Patient Positioning: Upright in bed Baseline Vocal Quality: Clear Volitional Cough: Strong Volitional Swallow: Unable to elicit    Oral/Motor/Sensory Function Overall Oral Motor/Sensory Function: Impaired at baseline Labial ROM: Within Functional Limits Labial Symmetry: Within Functional Limits Labial Strength: Reduced Labial Sensation: Within Functional Limits Lingual ROM: Within Functional Limits Lingual Symmetry: Within Functional Limits Lingual Strength: Within Functional Limits   Ice Chips Ice chips: Not tested   Thin Liquid Thin Liquid: Impaired Presentation: Cup;Straw Oral Phase Impairments: Reduced labial seal Pharyngeal  Phase Impairments: Multiple swallows;Throat Clearing - Immediate;Throat Clearing - Delayed    Nectar Thick Nectar Thick Liquid: Not  tested   Honey Thick Honey Thick Liquid: Not tested   Puree Puree: Impaired Presentation: Spoon Pharyngeal Phase Impairments: Throat Clearing - Immediate;Throat Clearing - Delayed;Multiple swallows   Solid   GO Functional Assessment Tool Used: Bedside swallow eval Functional Limitations: Swallowing Swallow Current Status ([id number]): At least 40 percent but less than 60 percent impaired, limited or restricted Swallow Goal Status [phone]): At least 40 percent but less than 60 percent impaired, limited or restricted  Solid: Impaired Oral Phase Impairments: Reduced lingual movement/coordination Oral Phase Functional Implications: Oral residue (small amounts of residue) Pharyngeal Phase Impairments: Throat Clearing - Immediate;Throat Clearing - Delayed;Cough - Delayed       Rebecca Eaton, Amy K, MA, CCC-SLP 07/18/2013,2:36 PM

## 2013-07-18 NOTE — ED Notes (Signed)
Admitting physician at bedside

## 2013-07-18 NOTE — H&P (Signed)
PCP:  Michele Mcalpine, MD    Chief Complaint:  "weak spell"  HPI: Margaret [name] is a 79 y.o. female   has a past medical history of Unspecified sinusitis (chronic); Unspecified essential hypertension; Cerebrovascular disease, unspecified; Unspecified venous (peripheral) insufficiency; Pure hypercholesterolemia; Hypothyroidism; Esophageal reflux; Unspecified constipation; Urinary tract infection, site not specified; Osteoarthrosis, unspecified whether generalized or localized, unspecified site; Degeneration of cervical intervertebral disc; Closed fracture of other bone of wrist; Anxiety state, unspecified; Idiopathic urticaria; Anemia, unspecified; Other B-complex deficiencies; Herpes zoster without mention of complication; IBS (irritable bowel syndrome); Vascular dementia; Hyponatremia; and Hospice care patient.   Presented with  Patient resides at independent living she has someone with her while she is awake. She has had a few weak spells. This episodes last a few minutes. She stairs into space but seem not to understand. After the episode she develops headache. She has had this episodes in the past few days. She has had some pains and aches overall. Decreased PO Intake. She is on hospice at this point.  Patient states she has trouble speaking during this episodes. She has been getting progressively weaker. Family would like to keep her comfort care only they do not wish additional studies but would like PT/OT assessment and hospice to be notified that patient is here.  Patient have had recent UTI and has been treated with cipro for this she is now on day 5  Review of Systems:    Pertinent positives include: aphasia, generalized weakness, gait abnormality,   Constitutional:  No weight loss, night sweats, Fevers, chills, fatigue, weight loss  HEENT:  No headaches, Difficulty swallowing,Tooth/dental problems,Sore throat,  No sneezing, itching, ear ache, nasal congestion, post nasal drip,   Cardio-vascular:  No chest pain, Orthopnea, PND, anasarca, dizziness, palpitations.no Bilateral lower extremity swelling  GI:  No heartburn, indigestion, abdominal pain, nausea, vomiting, diarrhea, change in bowel habits, loss of appetite, melena, blood in stool, hematemesis Resp:  no shortness of breath at rest. No dyspnea on exertion, No excess mucus, no productive cough, No non-productive cough, No coughing up of blood.No change in color of mucus.No wheezing. Skin:  no rash or lesions. No jaundice GU:  no dysuria, change in color of urine, no urgency or frequency. No straining to urinate.  No flank pain.  Musculoskeletal:  No joint pain or no joint swelling. No decreased range of motion. No back pain.  Psych:  No change in mood or affect. No depression or anxiety. No memory loss.  Neuro: no localizing neurological complaints, no tingling, no weakness, no double vision, no no   Otherwise ROS are negative except for above, 10 systems were reviewed  Past Medical History: Past Medical History  Diagnosis Date  . Unspecified sinusitis (chronic)   . Unspecified essential hypertension   . Cerebrovascular disease, unspecified     hx of multiple strokes  . Unspecified venous (peripheral) insufficiency   . Pure hypercholesterolemia   . Hypothyroidism   . Esophageal reflux   . Unspecified constipation   . Urinary tract infection, site not specified   . Osteoarthrosis, unspecified whether generalized or localized, unspecified site   . Degeneration of cervical intervertebral disc   . Closed fracture of other bone of wrist   . Anxiety state, unspecified   . Idiopathic urticaria   . Anemia, unspecified   . Other B-complex deficiencies   . Herpes zoster without mention of complication   . IBS (irritable bowel syndrome)   . Vascular dementia  wtih sundowning  . Hyponatremia   . Hospice care patient     [facility] [place]- call [phone] with any hospice needs,  admissions, discharge   Past Surgical History  Procedure Laterality Date  . Tonsillectomy    . Total abdominal hysterectomy w/ bilateral salpingoophorectomy    . Cervical fusion    . Cataract extraction    . Nasal sinus surgery    . Esophagogastroduodenoscopy  09/10/2011    Procedure: ESOPHAGOGASTRODUODENOSCOPY (EGD);  Surgeon: Petra Kuba, MD;  Location: [facility] ENDOSCOPY;  Service: Endoscopy;  Laterality: N/A;  c-arm needed  . Savory dilation  09/10/2011    Procedure: SAVORY DILATION;  Surgeon: Petra Kuba, MD;  Location: [facility] ENDOSCOPY;  Service: Endoscopy;  Laterality: N/A;  . Esophagogastroduodenoscopy  05/13/2012    Procedure: ESOPHAGOGASTRODUODENOSCOPY (EGD);  Surgeon: Petra Kuba, MD;  Location: [facility] ENDOSCOPY;  Service: Endoscopy;  Laterality: N/A;  with carm  . Savory dilation  05/13/2012    Procedure: SAVORY DILATION;  Surgeon: Petra Kuba, MD;  Location: [facility] ENDOSCOPY;  Service: Endoscopy;  Laterality: N/A;     Medications: Prior to Admission medications   Medication Sig Start Date End Date Taking? Authorizing Provider  acetaminophen (TYLENOL) 160 MG/5ML solution Take 650 mg by mouth every 6 (six) hours as needed for mild pain or moderate pain.   Yes Historical Provider, MD  ciprofloxacin (CIPRO) 250 MG tablet Take 1 tablet (250 mg total) by mouth every 12 (twelve) hours. 07/13/13  Yes Marissa Sciacca, PA-C  docusate (COLACE) 50 MG/5ML liquid Take 10 mLs (100 mg total) by mouth daily as needed for mild constipation. 06/23/13  Yes Renae Fickle, MD  feeding supplement (ENSURE COMPLETE) LIQD Take 237 mLs by mouth daily. 04/28/12  Yes Meredeth Ide, MD  haloperidol (HALDOL) 2 MG/ML solution Take 0.5 mLs (1 mg total) by mouth every 6 (six) hours as needed for agitation. 06/23/13  Yes Renae Fickle, MD  Lansoprazole 3 MG/ML SUSP Take 30 mg by mouth daily. 06/23/13  Yes Renae Fickle, MD  levofloxacin [facility]) 25 MG/ML solution Take 1 tablespoon by mouth two times daily 07/14/13  Yes  Michele Mcalpine, MD  levothyroxine (SYNTHROID, LEVOTHROID) 75 MCG tablet Take 75 mcg by mouth every morning.    Yes Historical Provider, MD  methylcellulose (ARTIFICIAL TEARS) 1 % ophthalmic solution Place 1 drop into both eyes as needed (dry eyes).    Yes Historical Provider, MD  Morphine Sulfate (MORPHINE CONCENTRATE) 10 mg / 0.5 ml concentrated solution Take 0.25 mLs (5 mg total) by mouth every 2 (two) hours as needed for moderate pain, severe pain, anxiety or shortness of breath (Headache, dyspnea, distress, agitation). 06/22/13  Yes Renae Fickle, MD  sorbitol 70 % SOLN Take 30 mLs by mouth at bedtime. 06/22/13  Yes Renae Fickle, MD    Allergies:  No Known Allergies  Social History:  Ambulatory  Walker  Lives at independent living   reports that she has never smoked. She has never used smokeless tobacco. She reports that she does not drink alcohol or use illicit drugs.   Family History: family history includes Diabetes in her father and sister; Heart failure in her mother; Stroke in her mother and sister.    Physical Exam: Patient Vitals for the past 24 hrs:  BP Temp Temp src Pulse Resp SpO2  07/18/13 0515 142/76 mmHg - - 84 16 95 %  07/18/13 0500 147/81 mmHg - - 86 18 98 %  07/18/13 0315 137/84  mmHg - - 96 21 97 %  07/18/13 0313 171/91 mmHg 97.8 F (36.6 C) Oral 94 18 96 %    1. General:  in No Acute distress 2. Psychological: Alert and  Oriented 3. Head/ENT:     Dry Mucous Membranes                          Head Non traumatic, neck supple                          Normal  Dentition 4. SKIN:   decreased Skin turgor,  Skin clean Dry and intact no rash 5. Heart: Regular rate and rhythm no Murmur, Rub or gallop 6. Lungs: Clear to auscultation bilaterally, no wheezes or crackles   7. Abdomen: Soft, non-tender, Non distended 8. Lower extremities: no clubbing, cyanosis, or edema 9. Neurologically right facial droop, otherwise neurologically intact. 10. MSK: Normal range of  motion  body mass index is unknown because there is no weight on file.   Labs on Admission:   Recent Labs  07/18/13 0317  NA 124*  K 3.4*  CL 86*  CO2 28  GLUCOSE 117*  BUN 10  CREATININE 0.65  CALCIUM 9.4   No results found for this basename: AST, ALT, ALKPHOS, BILITOT, PROT, ALBUMIN,  in the last 72 hours No results found for this basename: LIPASE, AMYLASE,  in the last 72 hours  Recent Labs  07/18/13 0317  WBC 4.0  HGB 9.9*  HCT 27.1*  MCV 86.9  PLT 205   No results found for this basename: CKTOTAL, CKMB, CKMBINDEX, TROPONINI,  in the last 72 hours No results found for this basename: TSH, T4TOTAL, FREET3, T3FREE, THYROIDAB,  in the last 72 hours No results found for this basename: VITAMINB12, FOLATE, FERRITIN, TIBC, IRON, RETICCTPCT,  in the last 72 hours Lab Results  Component Value Date   HGBA1C 5.8* 08/11/2012    The CrCl is unknown because both a height and weight (above a minimum accepted value) are required for this calculation. ABG    Component Value Date/Time   PHART 7.417 10/28/2012 2059   HCO3 26.7* 10/28/2012 2059   TCO2 25 06/21/2013 0459   O2SAT 98.5 10/28/2012 2059     No results found for this basename: DDIMER    UA no UTI   Cultures:    Component Value Date/Time   SDES URINE, RANDOM 07/13/2013 1514   SPECREQUEST NONE 07/13/2013 1514   CULT  Value: KLEBSIELLA PNEUMONIAE Performed at [facility] 07/13/2013 1514   REPTSTATUS 07/15/2013 FINAL 07/13/2013 1514       Radiological Exams on Admission: Ct Head Wo Contrast  07/18/2013   CLINICAL DATA:  Headache and weakness for 2 days.  EXAM: CT HEAD WITHOUT CONTRAST  TECHNIQUE: Contiguous axial images were obtained from the base of the skull through the vertex without intravenous contrast.  COMPARISON:  CT of the head performed 07/13/2013  FINDINGS: There is no evidence of acute infarction, mass lesion, or intra- or extra-axial hemorrhage on CT.  Diffuse bilateral perinephric and subcortical  white matter change likely reflects small vessel ischemic microangiopathy.  The posterior fossa, including the cerebellum, brainstem and fourth ventricle, is within normal limits. The third and lateral ventricles, and basal ganglia are unremarkable in appearance. The cerebral hemispheres demonstrate grossly normal gray-white differentiation. No mass effect or midline shift is seen.  There is no evidence of fracture; visualized osseous  structures are unremarkable in appearance. The visualized portions of the orbits are within normal limits. The paranasal sinuses and mastoid air cells are well-aerated. No significant soft tissue abnormalities are seen.  IMPRESSION: 1. No acute intracranial pathology seen on CT. 2. Diffuse small vessel ischemic microangiopathy.   Electronically Signed   By: Roanna Raider M.D.   On: 07/18/2013 04:13   Dg Chest Portable 1 View  07/18/2013   CLINICAL DATA:  Headache.  EXAM: PORTABLE CHEST - 1 VIEW  COMPARISON:  Chest radiograph performed 07/13/2013  FINDINGS: The lungs are well-aerated. There is mild elevation of the left hemidiaphragm, with mild left basilar atelectasis. No pleural effusion or pneumothorax is seen.  The cardiomediastinal silhouette is within normal limits. No acute osseous abnormalities are seen. Cervical spinal fusion hardware is noted.  IMPRESSION: Mild elevation of the left hemidiaphragm, with mild left basilar atelectasis. Lungs otherwise grossly clear.   Electronically Signed   By: Roanna Raider M.D.   On: 07/18/2013 05:37    Chart has been reviewed  Assessment/Plan  79 yo F with failure to thrive, poor PO intake and likely TIA, admitted for comfort care and observation.   Present on Admission:  . Hyponatremia - stable and chronic, patient is comfort care at this point . Senile dementia, uncomplicated - stable . TIA (transient ischemic attack) - family do not wish MRI, echo or dopplers, will order PT/ OT . Failure to thrive in adult - poor po  intake and dysphasia, on hospice . ANEMIA - stable generalized pain - continue morphine and add IV pRN   Prophylaxis: SCD   Protonix  CODE STATUS: DNR/DNI  Other plan as per orders.  I have spent a total of 55 min on this admission  Zan Triska 07/18/2013, 5:59 AM

## 2013-07-18 NOTE — Progress Notes (Signed)
Subjective: 79 year old female with history of hypertension, CVA, hypothyroidism, vascular dementia was currently under hospice care was brought to the hospital with failure to thrive, agitation and admitted with possibility of likely TIA. Daughter at bedside Filed Vitals:   07/18/13 0848  BP: 172/108  Pulse: 92  Temp: 97.7 F (36.5 C)  Resp: 20    Chest: Clear Bilaterally Heart : S1S2 RRR Abdomen: Soft, nontender Ext : No edema Neuro: Alert, not oriented x3  A/P Question TIA Agitation Chronic hyponatremia Vascular dementia Failure to thrive  Discussed with patient's daughter in detail at bedside, at this time they do not want aggressive interventions for workup of TIA versus stroke. Plan is for total comfort care. I will start the patient on Haldol 2 mg IV every 4 hours when necessary for agitation. [facility] has been contacted by the daughter and they will follow with the patient. Possible discharge to residential hospice when bed becomes available.  Meredeth Ide [facility] [phone]

## 2013-07-18 NOTE — Progress Notes (Signed)
Patient received diet order from SLP.  RN went to give patient meds that were held earlier today and patient is very sleepy due to haldol dose given earlier.  RN will try to give meds later when patient is more alert.  Lance Bosch, RN

## 2013-07-18 NOTE — Evaluation (Signed)
Physical Therapy Evaluation Patient Details Name: Margaret [name] MRN: [id number] DOB: [birth year] Today's Date: 07/18/2013 Time: 4098-1191 PT Time Calculation (min): 31 min  PT Assessment / Plan / Recommendation History of Present Illness  79 y.o. female admitted to [facility] on 07/18/13 with history of hypertension, CVA, hypothyroidism, vascular dementia was currently under hospice care was brought to the hospital with failure to thrive, agitation and admitted with possibility of likely TIA. Of note, she was recently being treated for UTI.    Clinical Impression  Pt mobilized better than I anticipated with min assist RW 100' with one seated rest break.  She is very frail looking and reports feeling very "shaky" on her feet.  She would benefit at this time from a higher level of care.  I am recommending SNF placement and it looks like Hospice will be involved.   PT to follow acutely for deficits listed below.       PT Assessment  Patient needs continued PT services    Follow Up Recommendations  SNF;Other (comment) (with hospice)    Does the patient have the potential to tolerate intense rehabilitation     NA  Barriers to Discharge   None      Equipment Recommendations  None recommended by PT    Recommendations for Other Services   None  Frequency Min 2X/week    Precautions / Restrictions Precautions Precautions: Fall   Pertinent Vitals/Pain See vitals flow sheet.       Mobility  Bed Mobility Bed Mobility: Supine to Sit;Sitting - Scoot to Edge of Bed;Sit to Supine Supine to Sit: 4: Min guard;With rails;HOB elevated Sitting - Scoot to Edge of Bed: 4: Min guard;With rail Sit to Supine: 4: Min guard;With rail;HOB elevated Details for Bed Mobility Assistance: min guard assist to support trunk for balance during transitions.  Transfers Transfers: Sit to Stand;Stand to Sit Sit to Stand: 4: Min assist;From elevated surface;With upper extremity assist;With armrests;From bed;From  chair/3-in-1 Stand to Sit: 4: Min assist;With upper extremity assist;With armrests;To bed;To chair/3-in-1 Details for Transfer Assistance: min assist to support trunk for balance to get to standing, min assist to help control descent to sit as pt "plops" down to sitting.  Ambulation/Gait Ambulation/Gait Assistance: 4: Min assist Ambulation Distance (Feet): 100 Feet (with one seated rest break) Assistive device: Rolling walker Ambulation/Gait Assistance Details: pt reports she feels very shaky on her feet.  No signs of leg buckling.   Gait Pattern: Step-through pattern;Shuffle;Trunk flexed Gait velocity: faster than is safe, but I believe it is because she is rushing to sit down.  Modified Rankin (Stroke Patients Only) Pre-Morbid Rankin Score: Moderately severe disability Modified Rankin: Moderately severe disability        PT Diagnosis: Difficulty walking;Abnormality of gait;Generalized weakness;Acute pain  PT Problem List: Decreased strength;Decreased activity tolerance;Decreased balance;Decreased mobility;Pain PT Treatment Interventions: DME instruction;Gait training;Functional mobility training;Therapeutic activities;Therapeutic exercise;Balance training;Patient/family education;Modalities;Neuromuscular re-education;Cognitive remediation     PT Goals(Current goals can be found in the care plan section) Acute Rehab PT Goals Patient Stated Goal: to get stronger PT Goal Formulation: With patient Time For Goal Achievement: 08/01/13 Potential to Achieve Goals: Good  Visit Information  Last PT Received On: 07/18/13 Assistance Needed: +1 History of Present Illness: 79 y.o. female admitted to [facility] on 07/18/13 with history of hypertension, CVA, hypothyroidism, vascular dementia was currently under hospice care was brought to the hospital with failure to thrive, agitation and admitted with possibility of likely TIA. Of note, she was recently being treated for  UTI.         Prior  Functioning  Home Living Family/patient expects to be discharged to:: Private residence Living Arrangements: Alone Available Help at Discharge: Available PRN/intermittently Type of Home: Apartment Home Access: Elevator Home Layout: One level Home Equipment: Environmental consultant - 2 wheels;Grab bars - tub/shower Additional Comments: family assists pt 2-3 days/week, also has aide as needed Prior Function Level of Independence: Needs assistance Gait / Transfers Assistance Needed: used RW - pt stated she walked with RW independently  ADL's / Homemaking Assistance Needed: hired help comes 2-3x/wk to assist with shower. pt states she could usually dress herself.  She reports she walks with RW to dining room.   Communication / Swallowing Assistance Needed: On modified diet (dysphasia 2, thin liquids) Comments: Pt likes to play bingo.  Communication Communication: HOH Dominant Hand: Right    Cognition  Cognition Arousal/Alertness: Awake/alert Behavior During Therapy: WFL for tasks assessed/performed Overall Cognitive Status: History of cognitive impairments - at baseline    Extremity/Trunk Assessment Upper Extremity Assessment Upper Extremity Assessment: Defer to OT evaluation Lower Extremity Assessment Lower Extremity Assessment: Generalized weakness Cervical / Trunk Assessment Cervical / Trunk Assessment: Kyphotic   Balance Balance Balance Assessed: Yes Static Sitting Balance Static Sitting - Balance Support: Bilateral upper extremity supported;Feet supported Static Sitting - Level of Assistance: 6: Modified independent (Device/Increase time) Static Standing Balance Static Standing - Balance Support: Bilateral upper extremity supported Static Standing - Level of Assistance: 4: Min assist Dynamic Standing Balance Dynamic Standing - Balance Support: Bilateral upper extremity supported Dynamic Standing - Level of Assistance: 4: Min assist  End of Session PT - End of Session Equipment Utilized  During Treatment: Gait belt Activity Tolerance: Patient limited by fatigue Patient left: in bed;with call bell/phone within reach  GP Functional Assessment Tool Used: assist levels Functional Limitation: Mobility: Walking and moving around Mobility: Walking and Moving Around Current Status [phone]): At least 20 percent but less than 40 percent impaired, limited or restricted Mobility: Walking and Moving Around Goal Status [phone]): At least 1 percent but less than 20 percent impaired, limited or restricted   Wasim Hurlbut B. Gabriel Paulding, PT, DPT [phone]   07/18/2013, 5:39 PM

## 2013-07-18 NOTE — ED Notes (Signed)
Patient presents to ED via [facility]. Patient called EMS for assistance with getting off the toilet- when EMS arrived patient stated that she had had a "headache" for 2 days, relief with tylenol. Pt also c/o of being increasingly weak the past 2 days and requested to be brought to the hospital. Patient is A&Ox3. No acute distress noted at this time, no neuro deficits noted. VSS.

## 2013-07-19 DIAGNOSIS — F0390 Unspecified dementia without behavioral disturbance: Secondary | ICD-10-CM

## 2013-07-19 MED ORDER — CLONIDINE HCL 0.2 MG/24HR TD PTWK
0.2000 mg | MEDICATED_PATCH | TRANSDERMAL | Status: DC
  Administered 2013-07-19: 0.2 mg via TRANSDERMAL
  Filled 2013-07-19: qty 1

## 2013-07-19 MED ORDER — HYDRALAZINE HCL 20 MG/ML IJ SOLN
10.0000 mg | Freq: Four times a day (QID) | INTRAMUSCULAR | Status: DC | PRN
  Administered 2013-07-19: 10 mg via INTRAVENOUS
  Filled 2013-07-19: qty 1

## 2013-07-19 NOTE — Progress Notes (Signed)
MC Rm 4N20 Margaret [name]- [facility] RN visit Margaret Mend RN  Related admission to [facility] diagnosis of CVA. Patient is a DNR code. Patient is alert to self. Daughter is at bedside. Patient had to go to bathroom so went out of the room and had lengthy conversation with daughter and son in law about discharge options. Daughter feels like the care at this point is too much but she does not want to place her mother. Patient leaves in a independent apartment and has no caregiver at night. Patient requires 24 hour care. Family is requesting a Psychologist, sport and exercise bed. Instructed daughter to follow up with Dot Lanes RN hospice tomorrow to discuss discharge plan.   Please call with any concerns [facility] [phone] ask for Liaison or on call Ulis Rias RN

## 2013-07-19 NOTE — Progress Notes (Signed)
[facility] PROGRESS NOTE  Margaret [name] BJY:[id number] DOB: 05-15-1924 DOA: 07/18/2013 PCP: Michele Mcalpine, MD  Assessment/Plan: 1. Vascular dementia- patient has severe dementia and is currently under hospice care. Patient will be continued on Haldol when necessary for agitation. 2. Hypertension- patient has elevated blood pressure, will start her on Catapres patch 0.2 mg q. 7 days 3. Hypothyroidism- continue Synthroid 4. Goals of care discussion- Discussed with patient's daughter in detail, patient is a DO NOT RESUSCITATE and total comfort care. No aggressive workup no labs. Patient will be discharged to residential hospice probably early next week.  Code Status: DNR Family Communication: *Discussed with daughter at bedside Disposition Plan: *Residential hospice   Consultants:  None  Procedures:  None  Antibiotics:  None  HPI/Subjective: Patient seen and examined, more mentally alert and appropriately answering questions.  Objective: Filed Vitals:   07/19/13 1704  BP: 195/72  Pulse: 93  Temp: 98.7 F (37.1 C)  Resp: 18    Intake/Output Summary (Last 24 hours) at 07/19/13 1711 Last data filed at 07/19/13 1300  Gross per 24 hour  Intake 798.75 ml  Output      0 ml  Net 798.75 ml   Filed Weights   07/18/13 0848  Weight: 54.205 kg (119 lb 8 oz)    Exam:  Physical Exam: Head: Normocephalic, atraumatic.  Eyes: No signs of jaundice, EOMI Nose: Mucous membranes dry.  Throat: Oropharynx nonerythematous, no exudate appreciated.  Neck: supple,No deformities, masses, or tenderness noted. Lungs: Normal respiratory effort. B/L Clear to auscultation, no crackles or wheezes.  Heart: Regular RR. S1 and S2 normal  Abdomen: BS normoactive. Soft, Nondistended, non-tender.  Extremities: No pretibial edema, no erythema  Liver Function Tests:  Recent Labs Lab 07/13/13 1621  AST 28  ALT 15  ALKPHOS 72  BILITOT 0.4  PROT 7.1  ALBUMIN 3.7   No results  found for this basename: LIPASE, AMYLASE,  in the last 168 hours No results found for this basename: AMMONIA,  in the last 168 hours CBC:  Recent Labs Lab 07/13/13 1621 07/18/13 0317  WBC 4.4 4.0  NEUTROABS 1.7  --   HGB 9.2* 9.9*  HCT 25.8* 27.1*  MCV 89.0 86.9  PLT 200 205   Cardiac Enzymes:  Recent Labs Lab 07/13/13 1621  TROPONINI <0.30   BNP (last 3 results)  Recent Labs  08/14/12 1134  PROBNP 1779.0*   CBG:  Recent Labs Lab 07/13/13 1523 07/18/13 2203 07/19/13 0702  GLUCAP 112* 107* 102*    Recent Results (from the past 240 hour(s))  URINE CULTURE     Status: None   Collection Time    07/13/13  3:14 PM      Result Value Range Status   Specimen Description URINE, RANDOM   Final   Special Requests NONE   Final   Culture  Setup Time     Final   Value: 07/13/2013 21:28     Performed at [facility] Count     Final   Value: >=100,000 COLONIES/ML     Performed at [facility]   Culture     Final   Value: KLEBSIELLA PNEUMONIAE     Performed at [facility]   Report Status 07/15/2013 FINAL   Final   Organism ID, Bacteria KLEBSIELLA PNEUMONIAE   Final  MRSA PCR SCREENING     Status: Abnormal   Collection Time    07/18/13  3:36 PM  Result Value Range Status   MRSA by PCR POSITIVE (*) NEGATIVE Final   Comment:            The GeneXpert MRSA Assay (FDA     approved for NASAL specimens     only), is one component of a     comprehensive MRSA colonization     surveillance program. It is not     intended to diagnose MRSA     infection nor to guide or     monitor treatment for     MRSA infections.     RESULT CALLED TO, READ BACK BY AND VERIFIED WITH:     Tama Headings [id number] 07/18/13 SCALES H     Studies: Ct Head Wo Contrast  07/18/2013   CLINICAL DATA:  Headache and weakness for 2 days.  EXAM: CT HEAD WITHOUT CONTRAST  TECHNIQUE: Contiguous axial images were obtained from the base of the skull through the vertex  without intravenous contrast.  COMPARISON:  CT of the head performed 07/13/2013  FINDINGS: There is no evidence of acute infarction, mass lesion, or intra- or extra-axial hemorrhage on CT.  Diffuse bilateral perinephric and subcortical white matter change likely reflects small vessel ischemic microangiopathy.  The posterior fossa, including the cerebellum, brainstem and fourth ventricle, is within normal limits. The third and lateral ventricles, and basal ganglia are unremarkable in appearance. The cerebral hemispheres demonstrate grossly normal gray-white differentiation. No mass effect or midline shift is seen.  There is no evidence of fracture; visualized osseous structures are unremarkable in appearance. The visualized portions of the orbits are within normal limits. The paranasal sinuses and mastoid air cells are well-aerated. No significant soft tissue abnormalities are seen.  IMPRESSION: 1. No acute intracranial pathology seen on CT. 2. Diffuse small vessel ischemic microangiopathy.   Electronically Signed   By: Roanna Raider M.D.   On: 07/18/2013 04:13   Dg Chest Portable 1 View  07/18/2013   CLINICAL DATA:  Headache.  EXAM: PORTABLE CHEST - 1 VIEW  COMPARISON:  Chest radiograph performed 07/13/2013  FINDINGS: The lungs are well-aerated. There is mild elevation of the left hemidiaphragm, with mild left basilar atelectasis. No pleural effusion or pneumothorax is seen.  The cardiomediastinal silhouette is within normal limits. No acute osseous abnormalities are seen. Cervical spinal fusion hardware is noted.  IMPRESSION: Mild elevation of the left hemidiaphragm, with mild left basilar atelectasis. Lungs otherwise grossly clear.   Electronically Signed   By: Roanna Raider M.D.   On: 07/18/2013 05:37    Scheduled Meds: . aspirin  325 mg Oral Daily  . Chlorhexidine Gluconate Cloth  6 each Topical Q0600  . ciprofloxacin  250 mg Oral Q12H  . cloNIDine  0.2 mg Transdermal Weekly  . feeding supplement  (ENSURE COMPLETE)  237 mL Oral Daily  . Lansoprazole  30 mg Oral Daily  . levothyroxine  75 mcg Oral q morning - 10a  . mupirocin ointment  1 application Nasal BID  . sorbitol  30 mL Oral QHS   Continuous Infusions: . sodium chloride 75 mL/hr at 07/19/13 1616    Active Problems:   ANEMIA   Hyponatremia   Dysphagia   Senile dementia, uncomplicated   TIA (transient ischemic attack)   Failure to thrive in adult    Time spent: 25 min    [facility]  [facility] Pager [phone]*. If 7PM-7AM, please contact night-coverage at [email or web address], password [facility] 07/19/2013, 5:11 PM  LOS: 1 day

## 2013-07-19 NOTE — Progress Notes (Signed)
Utilization Review Completed.Margaret [name] T12/02/2013  

## 2013-07-20 MED ORDER — PANTOPRAZOLE SODIUM 40 MG PO PACK
40.0000 mg | PACK | Freq: Every day | ORAL | Status: DC
  Administered 2013-07-20: 40 mg
  Filled 2013-07-20: qty 20

## 2013-07-20 MED ORDER — CLONIDINE HCL 0.2 MG/24HR TD PTWK: 0.2000 mg | MEDICATED_PATCH | TRANSDERMAL | Status: AC

## 2013-07-20 MED FILL — Lansoprazole Cap Delayed Release 15 MG: ORAL | Qty: 10 | Status: AC

## 2013-07-20 NOTE — Progress Notes (Signed)
Patient transported via [facility] to [place] where daughter will take over care.  Lance Bosch, RN

## 2013-07-20 NOTE — Progress Notes (Signed)
Room [facility] 4N 20 - Krysia Zahradnik -[facility] RN Visit-R.Amiria Orrison RN  Related admission to [facility] diagnosis of CVA.   Pt is DNR code with OOF DNR on shadow chart.    Pt alert & oriented, lying in bed, mumbled speech with no complaints of pain or discomfort.    Dtr present. Patient's home medication list is on shadow chart.   Pt due to discharge home to [facility] today.  Per dtr, pt has had 5 TIA's events in last week.  Pt & dtr advised on safety with mobility and using the wheelchair as well as using the [facility] for greater safety.  [facility] RN & RN mgr advised of discharge pending.   Please call [facility] @ [phone]- ask for RN Liaison or after hours,ask for on-call RN with any hospice needs.   Thank you.  Joneen Boers, RN  [facility]  Hospice Liaison  [phone])
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
## 2013-07-20 NOTE — Evaluation (Signed)
Occupational Therapy Evaluation Patient Details Name: Margaret [name] MRN: [id number] DOB: [birth year] Today's Date: 07/20/2013 Time: 3086-5784 [phone]) OT Time Calculation (min): 35 min  OT Assessment / Plan / Recommendation History of present illness 79 y.o. female admitted to [facility] on 07/18/13 with history of hypertension, CVA, hypothyroidism, vascular dementia was currently under hospice care was brought to the hospital with failure to thrive, agitation and admitted with possibility of likely TIA. Of note, she was recently being treated for UTI.     Clinical Impression   PT admitted with FTT, TIA and UTI. Pt currently with functional limitiations due to the deficits listed below (see OT problem list).  Pt will benefit from skilled OT to increase their independence and safety with adls and balance to allow discharge home with hospice. Needs hospice to assess need for hoyer upon d/c home     OT Assessment  Patient needs continued OT Services    Follow Up Recommendations  Other (comment) (prafo pillow heel protectors/ hoyer lift from hospice )    Barriers to Discharge      Equipment Recommendations  Other (comment) (hoyer in near future)    Recommendations for Other Services    Frequency  Min 2X/week    Precautions / Restrictions     Pertinent Vitals/Pain Coughing aspirating thin liquids with medication    ADL  Eating/Feeding: Moderate assistance (unable to open cereal container) Where Assessed - Eating/Feeding: Bed level Grooming: Wash/dry face;Supervision/safety Where Assessed - Grooming: Supported sitting Toilet Transfer: Minimal Dentist Method: Sit to Barista: Raised toilet seat with arms (or 3-in-1 over toilet) Toileting - Clothing Manipulation and Hygiene: Min guard Where Assessed - Engineer, mining and Hygiene: Lean right and/or left Equipment Used: Gait belt Transfers/Ambulation Related to ADLs: pt completed  stand pivot to bed side commode on the right side. Pt able to reach and complete transfer with minimal (A) ADL Comments:  Pt voiding bladder less than 25 cc on bedside commode. Daugther concerned that patient has not had bowel movement in over week. Pt with hx of constipation. Pt completing bed mobility with Pad (A) to scoot to eob. Daughter educated during session on use of bed linens to help with sliding and changing due to incontinence upon d/c home. daughter educated on need to change positions every 2 hours to decr bed sores and skin break down. Requesting prafo pillow boots for heels and need for hoyer in the future from hospice.    OT Diagnosis: Generalized weakness  OT Problem List: Decreased strength;Decreased range of motion;Decreased activity tolerance;Impaired balance (sitting and/or standing);Decreased safety awareness;Decreased knowledge of use of DME or AE;Decreased knowledge of precautions OT Treatment Interventions: Self-care/ADL training;Therapeutic exercise;DME and/or AE instruction;Therapeutic activities;Patient/family education;Balance training   OT Goals(Current goals can be found in the care plan section) Acute Rehab OT Goals Patient Stated Goal: to go home OT Goal Formulation: With patient/family Time For Goal Achievement: 08/03/13 Potential to Achieve Goals: Fair  Visit Information  Last OT Received On: 07/20/13 Assistance Needed: +1 History of Present Illness: 79 y.o. female admitted to [facility] on 07/18/13 with history of hypertension, CVA, hypothyroidism, vascular dementia was currently under hospice care was brought to the hospital with failure to thrive, agitation and admitted with possibility of likely TIA. Of note, she was recently being treated for UTI.         Prior Functioning     Home Living Family/patient expects to be discharged to:: Private residence Living Arrangements: Alone Available Help  at Discharge: Available PRN/intermittently Type of Home:  Apartment Home Access: Elevator Home Layout: One level Home Equipment: Walker - 2 wheels;Grab bars - tub/shower;Bedside commode;Wheelchair - manual Additional Comments: family to arrange 24/7 care now and have hospice (A) Prior Function Level of Independence: Needs assistance Gait / Transfers Assistance Needed: used RW - pt stated she walked with RW independently  ADL's / Homemaking Assistance Needed: hired help comes 2-3x/wk to assist with shower. pt states she could usually dress herself.  She reports she walks with RW to dining room.   Communication / Swallowing Assistance Needed: On modified diet (dysphasia 2, thin liquids) Comments: Pt likes to play bingo.  Communication Communication: HOH Dominant Hand: Right         Vision/Perception Vision - History Baseline Vision: No visual deficits   Cognition  Cognition Arousal/Alertness: Awake/alert Behavior During Therapy: WFL for tasks assessed/performed Overall Cognitive Status: History of cognitive impairments - at baseline    Extremity/Trunk Assessment Upper Extremity Assessment Upper Extremity Assessment: Generalized weakness (edema noted in bil UE) Lower Extremity Assessment Lower Extremity Assessment: Defer to PT evaluation Cervical / Trunk Assessment Cervical / Trunk Assessment: Kyphotic     Mobility Bed Mobility Bed Mobility: Supine to Sit;Sitting - Scoot to Edge of Bed;Sit to Supine Supine to Sit: 4: Min guard;With rails;HOB elevated Sitting - Scoot to Edge of Bed: 4: Min assist;With rail Sit to Supine: 4: Min guard;HOB elevated;With rail Details for Bed Mobility Assistance: pad used to help scoot to eob Transfers Transfers: Sit to Stand;Stand to Sit Sit to Stand: 4: Min assist;With upper extremity assist;From bed Stand to Sit: 4: Min assist;With upper extremity assist;To chair/3-in-1 Details for Transfer Assistance: pt able to turn and sit on commode at bedside controlled this session     Exercise      Balance     End of Session OT - End of Session Activity Tolerance: Patient tolerated treatment well Patient left: in bed;with call bell/phone within reach;with family/visitor present Nurse Communication: Mobility status;Precautions  GO     Harolyn Rutherford 07/20/2013, 11:25 AM Pager: [phone]

## 2013-07-20 NOTE — Progress Notes (Signed)
Patient discharge paperwork given to patient daughter and all education complete.  IV removed and patient going home with her daughter.  Lance Bosch, RN

## 2013-07-20 NOTE — Progress Notes (Signed)
VM to Wynonia Hazard with [facility]; patient to be discharged home today; return call requested;  Patient is active with [facility]) as prior to admission, daughter is to arrange for 24/7 care; Alexis Goodell [phone]

## 2013-07-20 NOTE — Progress Notes (Signed)
Pt's daughter wants to take pt home.  Daughter will arrange for 24/7 care.  Hospice homecare will continue to follow pt at home.  Daughter willing to take pt home whenever she is ready for discharge.  Pt will need transport home. Beth Mills,LCSW

## 2013-07-20 NOTE — Clinical Social Work Note (Signed)
CSW met with pt's daughter at bedside. Pt's daughter stated that family wishes for pt to return home with [facility] to follow and care for pt with comfort care. Pt's daughter expressed gratitude for CSW coming to speak to family. CSW signing off.   Darlyn Chamber, LCSWA Clinical Social Worker [phone]

## 2013-07-23 NOTE — ED Provider Notes (Signed)
Medical screening examination/treatment/procedure(s) were conducted as a shared visit with non-physician practitioner(s) and myself.  I personally evaluated the patient during the encounter.  EKG Interpretation    Date/Time:  Monday July 13 2013 14:56:21 EST Ventricular Rate:  85 PR Interval:    QRS Duration: 88 QT Interval:  378 QTC Calculation: 449 R Axis:   -16 Text Interpretation:  Sinus Rhythm (confirmed with concurrent thythm strips) Borderline left axis deviation Anterior infarct, old ST elevation, consider inferior injury Artifact in lead(s) I II III aVR aVL aVF Confirmed by Fayrene Fearing  MD, Daryll Spisak ([id number]) on 07/13/2013 4:08:05 PM            Patient evaluated. Normal evaluation here. I discussed this with family. No signs of sepsis. Assessment urinary tract infection. Strong desire family the patient not be admitted. I think this is reasonable request. Given instructions to determine if worsening. Diagnosis urinary tract infection probable TIA  Roney Marion, MD 07/23/13 [phone]

## 2013-07-23 NOTE — Progress Notes (Signed)
OT NOTE (late entry)  07/20/13 1100  OT G-codes **NOT FOR INPATIENT CLASS**  Functional Assessment Tool Used clinical judgement  Functional Limitation Self care  Self Care Current Status (Z6109) CJ  Self Care Goal Status (U0454) Alyce Pagan   OTR/L Pager: [phone] Office: [phone] .

## 2013-08-17 ENCOUNTER — Telehealth: Payer: Self-pay | Admitting: Pulmonary Disease

## 2013-08-26 ENCOUNTER — Telehealth: Payer: Self-pay

## 2013-08-26 NOTE — Telephone Encounter (Signed)
Patient past away @ [facility] per Obituary in [place] [employer] °

## 2013-09-07 ENCOUNTER — Telehealth: Payer: Self-pay | Admitting: Pulmonary Disease

## 2013-09-07 NOTE — Telephone Encounter (Signed)
Please advise leigh if you have any orders on pt? thanks

## 2013-09-08 NOTE — Telephone Encounter (Signed)
Called and spoke with Amy from Hospice and she is aware that this form has been faxed back and i have placed this in the scan folder.

## 2013-09-13 NOTE — Telephone Encounter (Signed)
I will forward to SN as FYI. [place] Bing, CMA

## 2013-09-13 DEATH — deceased

## 2013-12-07 IMAGING — CR DG ABDOMEN 1V
1 series · 1 of 1 positions shown · non-contrast
Comparison: 10/11/2011

CLINICAL DATA: Constipation

ABDOMEN - 1 VIEW

[t abdomen supine]
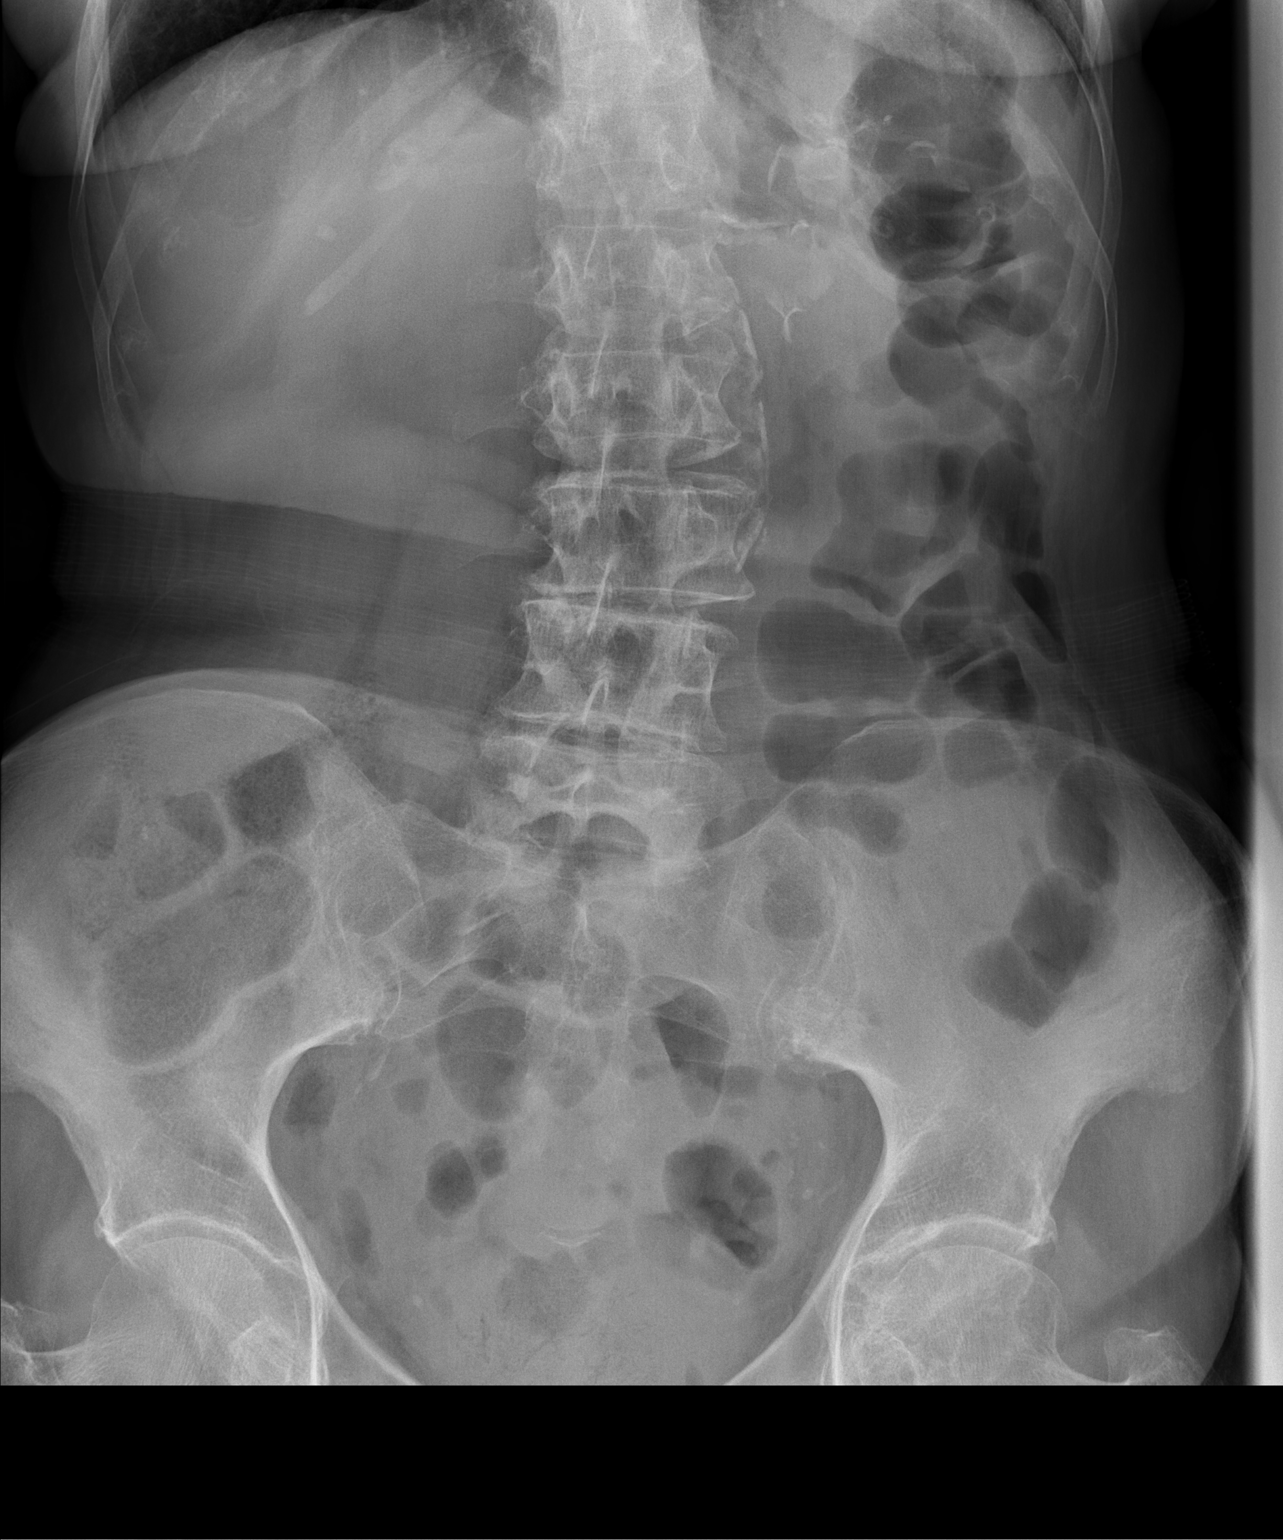

[1 of 1 positions shown; findings below may reference images not displayed]

FINDINGS: Supine abdomen shows no gaseous bowel dilatation to
suggest obstruction.  Air is seen scattered along the length of a
nondilated colon.  There is some stool in the right colon and in
the rectal vault.  Convex leftward lumbar scoliosis is evident.
IMPRESSION: No evidence for bowel obstruction.  Prominent stool volume noted in
the rectum.

## 2013-12-08 IMAGING — CT CT ABD-PELV W/ CM
2 of 5 series · 17 of 46 positions shown, 19 images · IV contrast (water/omni  & 80ml omni 300)
Comparison: Radiographs 04/16/2012.  CT 06/09/2010.

CLINICAL DATA: Constipation for 4 days with abdominal pain.

CT ABDOMEN AND PELVIS WITH CONTRAST
TECHNIQUE: Multidetector CT imaging of the abdomen and pelvis was
performed following the standard protocol during bolus
administration of intravenous contrast.
Contrast: 80mL OMNIPAQUE IOHEXOL 300 MG/ML  SOLN

[Series 2: routine abdomen · axial · 0.78mm/px · z∈[-419,-34]mm · 14 of 87 slices shown, 16 images]
[im 5/87  soft-tissue]
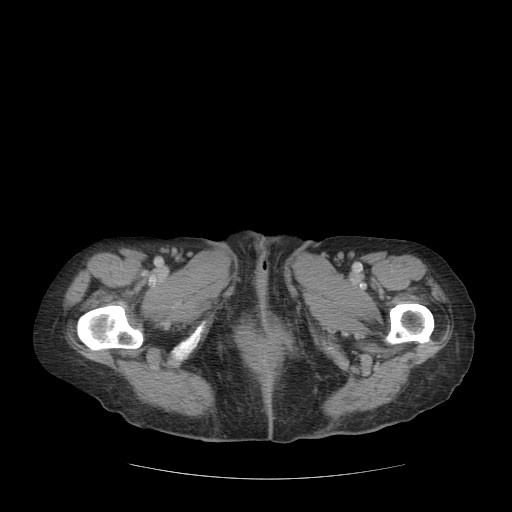
[im 5/87  bone]
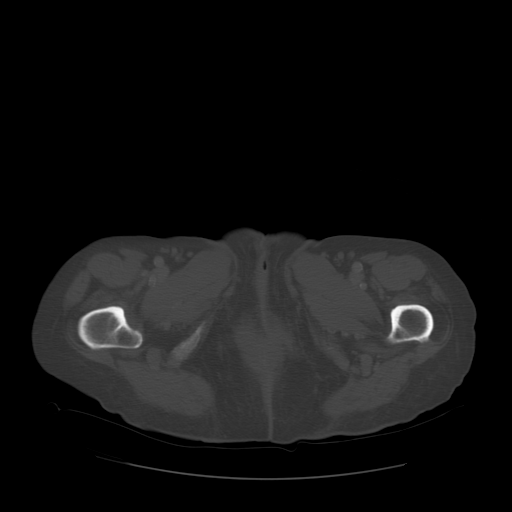
[im 10/87  soft-tissue]
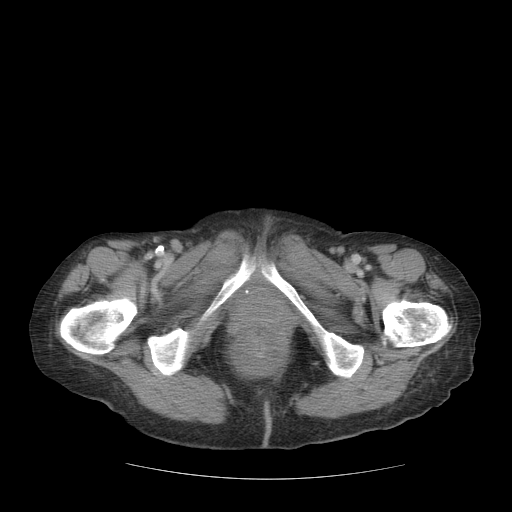
[im 19/87  soft-tissue]
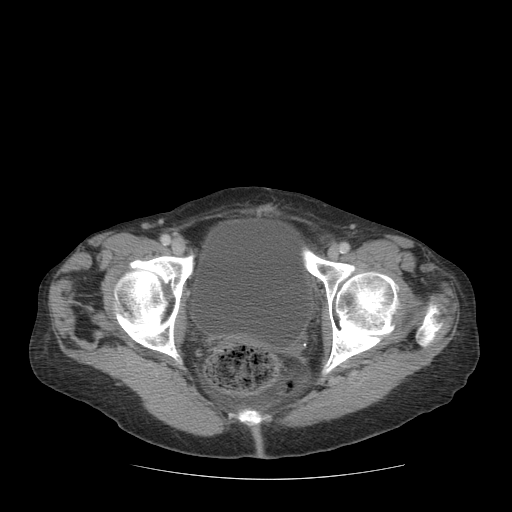
[im 23/87  soft-tissue]
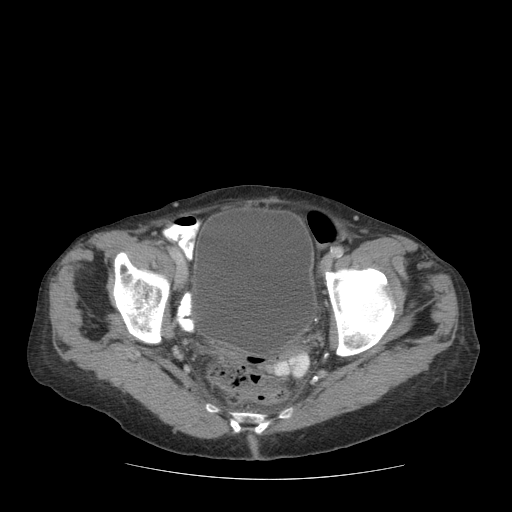
[im 28/87  soft-tissue]
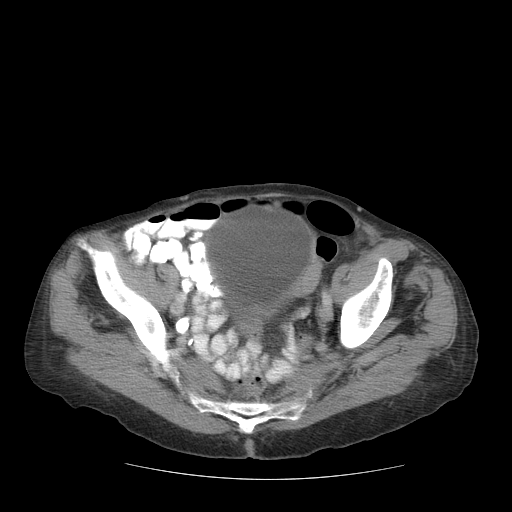
[im 37/87  soft-tissue]
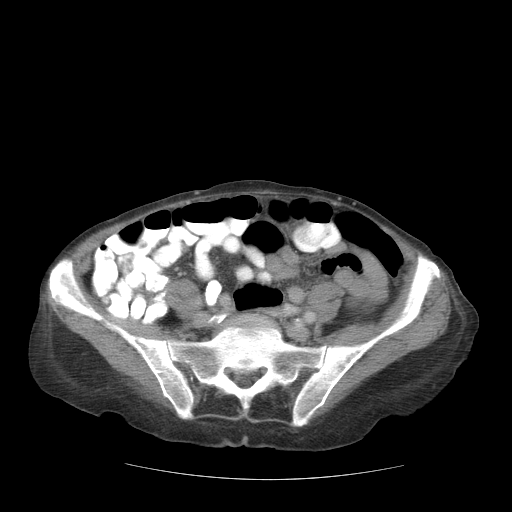
[im 41/87  soft-tissue]
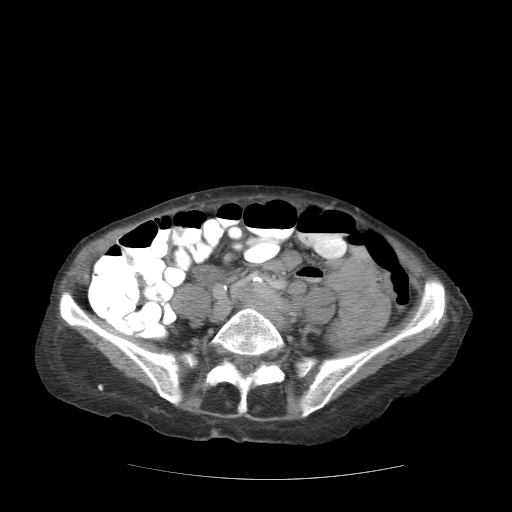
[im 46/87  soft-tissue]
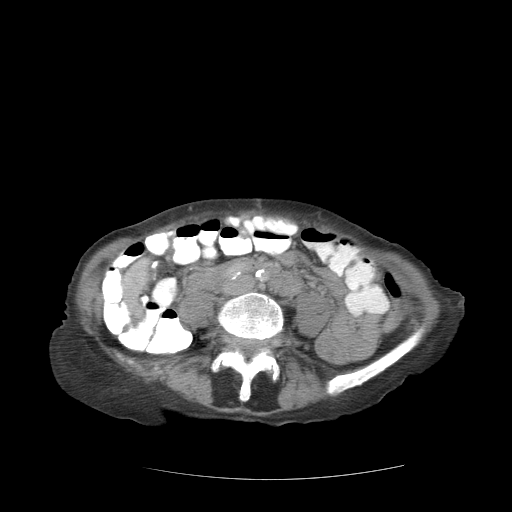
[im 50/87  soft-tissue]
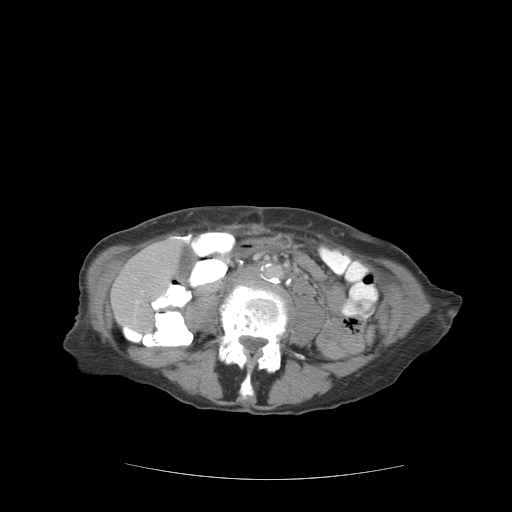
[im 50/87  bone]
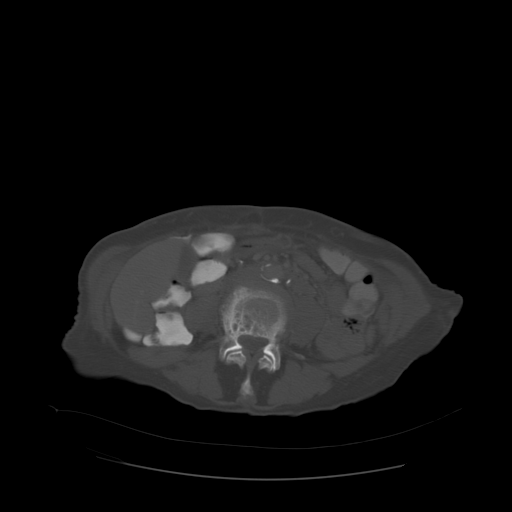
[im 59/87  soft-tissue]
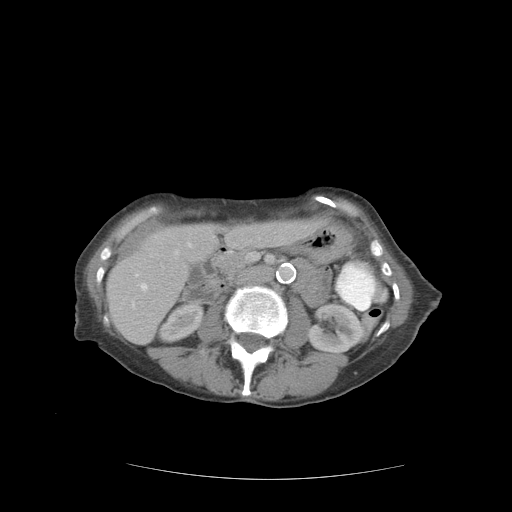
[im 64/87  soft-tissue]
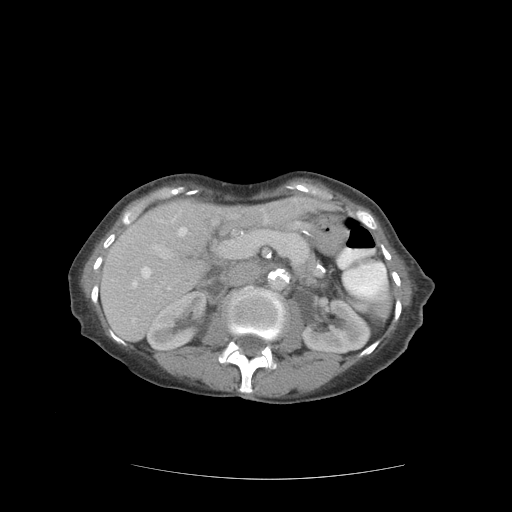
[im 68/87  soft-tissue]
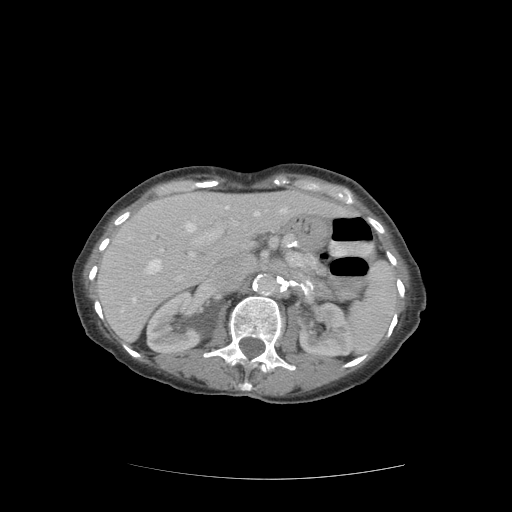
[im 77/87  soft-tissue]
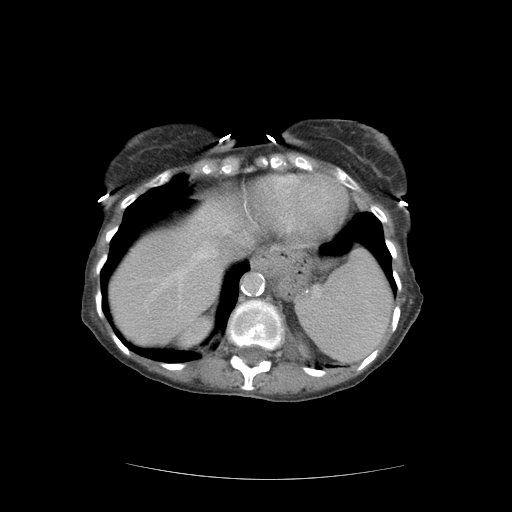
[im 82/87  soft-tissue]
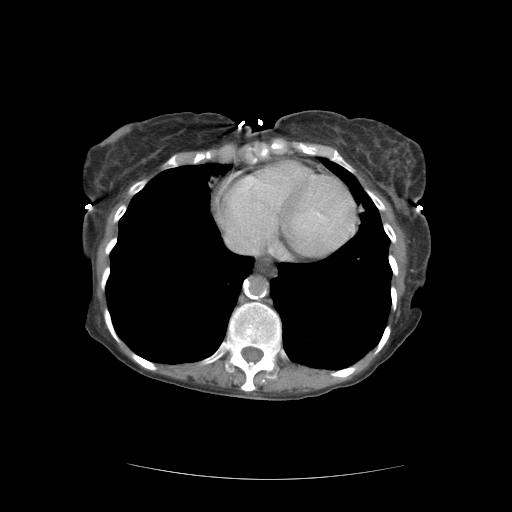

[Series 401: cor · coronal · 0.86mm/px · 3 of 82 slices shown]
[im 28/82  soft-tissue]
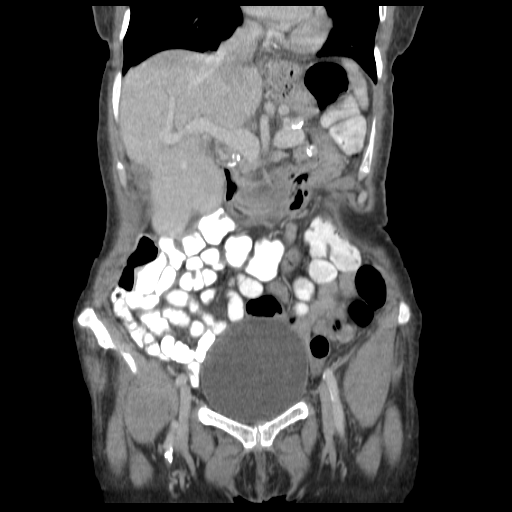
[im 37/82  soft-tissue]
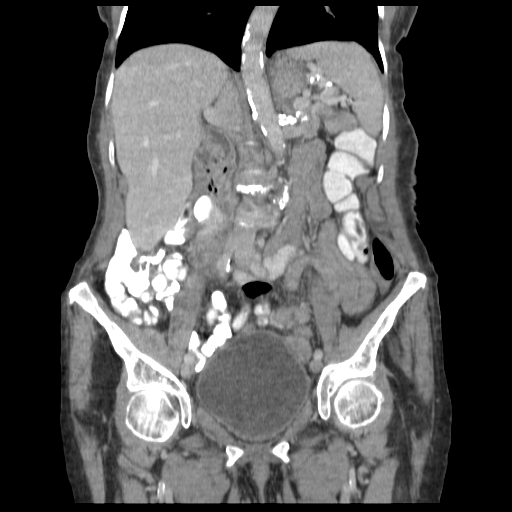
[im 46/82  soft-tissue]
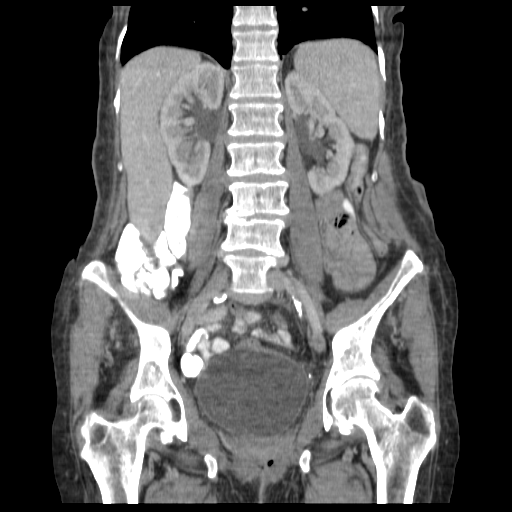

[17 of 46 positions shown; findings below may reference images not displayed]

FINDINGS: There is mild atelectasis or scarring in the right middle
lobe and lingula.

The liver, gallbladder and biliary system appear normal.  The
pancreas is atrophied without focal abnormality.  The spleen and
adrenal glands appear normal.  Small low-density renal lesions
bilaterally are unchanged.  Most of these are cysts.  There is a
tiny angiomyolipoma in the lower pole of the left kidney (image
27).  Bilateral extrarenal pelves are noted.

There is extensive aorto iliac and proximal visceral arterial
atherosclerosis.  No enlarged abdominal pelvic lymph nodes are
identified.  Urinary bladder is moderately distended.  There is no
pelvic mass status post hysterectomy.  Perirectal soft tissue
stranding is similar to the prior examination.  There is no focal
fluid collection or other pelvic inflammatory process.

There is multilevel spondylosis inferiorly in the lumbar spine with
a degenerative anterolisthesis at L4-L5.  Diffuse bulging of the
disc contributes to severe spinal and biforaminal stenosis at that
level.  There is asymmetric left hip arthropathy.  No acute osseous
findings are seen.
IMPRESSION: 1.  Persistent or recurrent perirectal soft tissue stranding,
possibly representing proctitis.  Correlate clinically.
2.  No evidence of bowel obstruction or other inflammatory process.
3.  Extensive atherosclerosis.
4.  Lower lumbar spondylosis with severe multifactorial spinal
stenosis at L4-L5.
